# Patient Record
Sex: Female | Born: 1955 | Race: White | Hispanic: No | Marital: Single | Smoking: Former smoker
Health system: Southern US, Community
[De-identification: ages and names within clinical notes are randomized; demographics above are authoritative.]

## PROBLEM LIST (undated history)

## (undated) DIAGNOSIS — Z803 Family history of malignant neoplasm of breast: Secondary | ICD-10-CM

## (undated) DIAGNOSIS — M797 Fibromyalgia: Secondary | ICD-10-CM

## (undated) DIAGNOSIS — C50919 Malignant neoplasm of unspecified site of unspecified female breast: Secondary | ICD-10-CM

## (undated) DIAGNOSIS — B1920 Unspecified viral hepatitis C without hepatic coma: Secondary | ICD-10-CM

## (undated) DIAGNOSIS — B029 Zoster without complications: Secondary | ICD-10-CM

## (undated) DIAGNOSIS — Z808 Family history of malignant neoplasm of other organs or systems: Secondary | ICD-10-CM

## (undated) DIAGNOSIS — Z8042 Family history of malignant neoplasm of prostate: Secondary | ICD-10-CM

## (undated) DIAGNOSIS — J40 Bronchitis, not specified as acute or chronic: Secondary | ICD-10-CM

## (undated) HISTORY — DX: Family history of malignant neoplasm of breast: Z80.3

## (undated) HISTORY — DX: Family history of malignant neoplasm of other organs or systems: Z80.8

## (undated) HISTORY — DX: Unspecified viral hepatitis C without hepatic coma: B19.20

## (undated) HISTORY — DX: Family history of malignant neoplasm of prostate: Z80.42

---

## 2004-06-02 ENCOUNTER — Other Ambulatory Visit: Admission: RE | Admit: 2004-06-02 | Discharge: 2004-06-02 | Payer: Self-pay | Admitting: Family Medicine

## 2010-05-08 DIAGNOSIS — A4902 Methicillin resistant Staphylococcus aureus infection, unspecified site: Secondary | ICD-10-CM

## 2010-05-08 HISTORY — DX: Methicillin resistant Staphylococcus aureus infection, unspecified site: A49.02

## 2014-07-16 ENCOUNTER — Ambulatory Visit: Payer: Self-pay | Admitting: Family

## 2018-04-17 ENCOUNTER — Encounter (INDEPENDENT_AMBULATORY_CARE_PROVIDER_SITE_OTHER): Payer: Self-pay | Admitting: Internal Medicine

## 2019-10-02 ENCOUNTER — Other Ambulatory Visit: Payer: Self-pay | Admitting: *Deleted

## 2019-10-02 ENCOUNTER — Other Ambulatory Visit: Payer: Self-pay | Admitting: Nurse Practitioner

## 2019-10-02 DIAGNOSIS — Z1231 Encounter for screening mammogram for malignant neoplasm of breast: Secondary | ICD-10-CM

## 2019-10-13 ENCOUNTER — Ambulatory Visit
Admission: RE | Admit: 2019-10-13 | Discharge: 2019-10-13 | Disposition: A | Discharge status: Home / Self Care | Payer: Medicare Other | Source: Ambulatory Visit | Attending: *Deleted | Admitting: *Deleted

## 2019-10-13 ENCOUNTER — Other Ambulatory Visit: Payer: Self-pay

## 2019-10-13 DIAGNOSIS — Z1231 Encounter for screening mammogram for malignant neoplasm of breast: Secondary | ICD-10-CM

## 2019-10-14 ENCOUNTER — Other Ambulatory Visit: Payer: Self-pay | Admitting: Nurse Practitioner

## 2019-10-14 DIAGNOSIS — N63 Unspecified lump in unspecified breast: Secondary | ICD-10-CM

## 2019-10-27 ENCOUNTER — Other Ambulatory Visit: Payer: Self-pay | Admitting: Nurse Practitioner

## 2019-10-27 ENCOUNTER — Other Ambulatory Visit: Payer: Self-pay

## 2019-10-27 ENCOUNTER — Ambulatory Visit
Admission: RE | Admit: 2019-10-27 | Discharge: 2019-10-27 | Disposition: A | Discharge status: Home / Self Care | Payer: Medicare Other | Source: Ambulatory Visit | Attending: Nurse Practitioner | Admitting: Nurse Practitioner

## 2019-10-27 DIAGNOSIS — N63 Unspecified lump in unspecified breast: Secondary | ICD-10-CM

## 2019-10-27 DIAGNOSIS — R599 Enlarged lymph nodes, unspecified: Secondary | ICD-10-CM

## 2019-10-27 DIAGNOSIS — R921 Mammographic calcification found on diagnostic imaging of breast: Secondary | ICD-10-CM

## 2019-10-28 ENCOUNTER — Ambulatory Visit
Admission: RE | Admit: 2019-10-28 | Discharge: 2019-10-28 | Disposition: A | Discharge status: Home / Self Care | Payer: Medicare Other | Source: Ambulatory Visit | Attending: Nurse Practitioner | Admitting: Nurse Practitioner

## 2019-10-28 ENCOUNTER — Other Ambulatory Visit: Payer: Self-pay | Admitting: Nurse Practitioner

## 2019-10-28 ENCOUNTER — Other Ambulatory Visit: Payer: Self-pay

## 2019-10-28 DIAGNOSIS — N63 Unspecified lump in unspecified breast: Secondary | ICD-10-CM

## 2019-10-28 DIAGNOSIS — R599 Enlarged lymph nodes, unspecified: Secondary | ICD-10-CM

## 2019-10-30 ENCOUNTER — Encounter: Payer: Self-pay | Admitting: *Deleted

## 2019-10-30 ENCOUNTER — Telehealth: Payer: Self-pay | Admitting: Hematology and Oncology

## 2019-10-30 NOTE — Telephone Encounter (Signed)
Spoke with patient to confirm morning [facility] appointment for 6/30, packet will be mailed to paitent

## 2019-10-31 ENCOUNTER — Ambulatory Visit
Admission: RE | Admit: 2019-10-31 | Discharge: 2019-10-31 | Disposition: A | Discharge status: Home / Self Care | Payer: Medicare Other | Source: Ambulatory Visit | Attending: Nurse Practitioner | Admitting: Nurse Practitioner

## 2019-10-31 ENCOUNTER — Other Ambulatory Visit: Payer: Self-pay | Admitting: Nurse Practitioner

## 2019-10-31 ENCOUNTER — Telehealth: Payer: Self-pay | Admitting: Hematology

## 2019-10-31 ENCOUNTER — Other Ambulatory Visit: Payer: Self-pay

## 2019-10-31 ENCOUNTER — Other Ambulatory Visit: Payer: Self-pay | Admitting: *Deleted

## 2019-10-31 DIAGNOSIS — C50412 Malignant neoplasm of upper-outer quadrant of left female breast: Secondary | ICD-10-CM | POA: Insufficient documentation

## 2019-10-31 DIAGNOSIS — R921 Mammographic calcification found on diagnostic imaging of breast: Secondary | ICD-10-CM

## 2019-10-31 DIAGNOSIS — Z171 Estrogen receptor negative status [ER-]: Secondary | ICD-10-CM

## 2019-10-31 NOTE — Telephone Encounter (Signed)
Patient will now be attending the afternoon clinic on 6/30, patient is aware of change

## 2019-11-04 NOTE — Progress Notes (Signed)
[place]   [phone] Fax:([phone]) [place] Note   Patient Care Team: Scotty Court, DO as PCP - General Mauro Kaufmann, RN as Oncology Nurse Navigator Rockwell Germany, RN as Oncology Nurse Navigator [name], MD as Consulting Physician (General Surgery) Truitt Merle, MD as Consulting Physician (Hematology) Eppie Gibson, MD as Attending Physician (Radiation Oncology)  Date of Service:  11/05/2019   CHIEF COMPLAINTS/PURPOSE OF CONSULTATION:  Newly diagnosed Malignant neoplasm of upper-outer quadrant of left breast, triple negative   Oncology History Overview Note  Cancer Staging Malignant neoplasm of upper-outer quadrant of left breast in female, estrogen receptor negative ([place]) Staging form: Breast, AJCC 8th Edition - Clinical stage from 10/28/2019: cT1b, cN1, cM0, GX, ER-, PR-, HER2- - Signed by Truitt Merle, MD on 11/04/2019    Malignant neoplasm of upper-outer quadrant of left breast in female, estrogen receptor negative ([place])  10/27/2019 Mammogram   IMPRESSION: 1. Right breast calcifications spanning 2.6 cm in the upper slightly medial breast are indeterminate.   2. Left breast dominant mass at 3 o'clock, 6 cm from nipple measuring 3x3.9x2.9 cm is highly suspicious. There is overlying skin thickening, skin involvement is not excluded.   3. Left breast mass/distorted tissue at 1 o'clock, 4cm from nipple measuring 3.0x1.2x2.9 cm is likely in contiguity with the dominant mass at 3 o'clock and is also highly suspicious. With the dominant lesion the overall span a disease is approximately 6 cm.   4. Left breast distortion at 2 o'clock 10 cm from the nipple is Suspicious, measuring 1.0 x 0.8 cm.   5.  Abnormal left axillary lymph nodes (2). Cortical thickness measures up to 0.6 cm.    10/28/2019 Initial Biopsy   Diagnosis 1. Breast, left, needle core biopsy, 3 o'clock, 5cmfn - INVASIVE MAMMARY CARCINOMA. 2. Breast,  left, needle core biopsy, 2 o'clock, 10cmfn - INVASIVE MAMMARY CARCINOMA. 3. 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 Microscopic Comment 1. -3. Overall, immunohistochemistry favors a pronounced desmoplastic response, but metaplastic carcinoma cannot be excluded (Epithelioid component: CKAE1AE3 strong +, CK5/6 weak +). The greatest linear extent of tumor in any one core in specimen 1 is 14 mm. The greatest linear extent of tumor in any one core in specimen 2 is 16 mm.   10/28/2019 Receptors her2   3. PROGNOSTIC INDICATORS Results: IMMUNOHISTOCHEMICAL AND MORPHOMETRIC ANALYSIS PERFORMED MANUALLY The tumor cells are EQUIVOCAL for Her2 (2+). Her2 by FISH will be performed and results reported separately. Estrogen Receptor: 0%, NEGATIVE Progesterone Receptor: 0%, NEGATIVE Proliferation Marker Ki67: 10%    3. FLUORESCENCE IN-SITU HYBRIDIZATION Results: GROUP 5: HER2 **NEGATIVE** Equivocal form of amplification of the HER2 gene was detected in the IHC 2+ tissue sample received from this individual. HER2 FISH was performed by a technologist and cell imaging and analysis on the [employer].   10/28/2019 Cancer Staging   Staging form: Breast, AJCC 8th Edition - Clinical stage from 10/28/2019: Stage IIB (cT1b, cN1, cM0, G3, ER-, PR-, HER2-) - Signed by Truitt Merle, MD on 11/05/2019   10/31/2019 Initial Diagnosis   Malignant neoplasm of upper-outer quadrant of left breast in female, estrogen receptor negative ([place])   10/31/2019 Pathology Results   Diagnosis Breast, right, needle core biopsy, UOQ, posterior - FOCAL USUAL DUCTAL HYPERPLASIA AND FIBROCYSTIC CHANGES WITH CALCIFICATIONS - FIBROADENOMATOID CHANGES - NO MALIGNANCY IDENTIFIED Microscopic Comment These results were called to [facility] on November 03, 2019.      HISTORY OF PRESENTING ILLNESS:  Lindsay [name] 64 y.o. female is a here because of newly diagnosed  left breast cancer. The patient presents to the Breast clinic today alone.   She previously has not had a mammogram in several years. She notes she felt her left breast mass herself in 03/2019 which was about a dime size. She went in for work up for this in the past month as she noticed her mass was growing in 08/2019, now similar to a 50 cent piece. She notes she has manageable pain in her left breast, which the biopsy exacerbated. She has been using Tylenol and ice packs. She has not noticed skin change or nipple discharge.   Today the patient notes occasional HA's in the morning if she does not sleep well. She will use tylenol or rest. She notes chronic bronchitis so will cough occasionally. She has no GI symptoms. Her energy level has decreased in the past 6 months. She notes she has skin rash of her lower right abdomen which she attributes to shingles.   Socially she lives alone and single. She has no children. She quit smoking at age 16. She does not drink alcohol. She has her own Tax inspector. She notes she is processing this diagnosis. She does not have any family that she is close to but has friends she has support from. She notes she is more concerned with quality of life, but is willing to proceed with necessary treatments.   She has a PMHx of Hep C diagnosed in 1992 contracted possible from her friend who had it. This has not been treated, but she is open to have it treated. She has had abdominal scans for this, recently managed by new PCP Dr. Fonnie Jarvis. She had MRSA before which she feels has not fully cleared. Her MGM had unknown cancer and PGM had unknown cancer in bone. Her maternal uncle had prostate cancer. Her paternal cousin had lung cancer. Her maternal aunt had breast cancer in her 9s.    GYN HISTORY  Menarchal: 11 LMP: 10-12 years ago Contraceptive: 1974-75 HRT: No G2P0A2 - 2 abortions    REVIEW OF SYSTEMS:    Constitutional: Denies fevers,  chills or abnormal night sweats (+) Occasional HA's  Eyes: Denies blurriness of vision, double vision or watery eyes Ears, nose, mouth, throat, and face: Denies mucositis or sore throat Respiratory: Denies cough, dyspnea or wheezes Cardiovascular: Denies palpitation, chest discomfort or lower extremity swelling Gastrointestinal:  Denies nausea, heartburn or change in bowel habits Skin: (+) skin rash of her lower right abdomen  Lymphatics: Denies new lymphadenopathy or easy bruising Neurological:Denies numbness, tingling or new weaknesses Behavioral/Psych: Mood is stable, no new changes  Breast: (+) Left breast mass with pain All other systems were reviewed with the patient and are negative.   MEDICAL HISTORY:  Past Medical History:  Diagnosis Date  . HCV (hepatitis C virus)     SURGICAL HISTORY: History reviewed. No pertinent surgical history.  SOCIAL HISTORY: Social History   Socioeconomic History  . Marital status: Single    Spouse name: Not on file  . Number of children: Not on file  . Years of education: Not on file  . Highest education level: Not on file  Occupational History  . Not on file  Tobacco Use  . Smoking status: Never Smoker  . Smokeless tobacco: Never Used  Substance and Sexual Activity  . Alcohol use: Never  .  Drug use: Never  . Sexual activity: Not on file  Other Topics Concern  . Not on file  Social History Narrative  . Not on file   Social Determinants of Health   Financial Resource Strain:   . Difficulty of Paying Living Expenses:   Food Insecurity:   . Worried About Charity fundraiser in the Last Year:   . Arboriculturist in the Last Year:   Transportation Needs:   . Film/video editor (Medical):   Marland Kitchen Lack of Transportation (Non-Medical):   Physical Activity:   . Days of Exercise per Week:   . Minutes of Exercise per Session:   Stress:   . Feeling of Stress :   Social Connections:   . Frequency of Communication with Friends and  Family:   . Frequency of Social Gatherings with Friends and Family:   . Attends Religious Services:   . Active Member of Clubs or Organizations:   . Attends Archivist Meetings:   Marland Kitchen Marital Status:   Intimate Partner Violence:   . Fear of Current or Ex-Partner:   . Emotionally Abused:   Marland Kitchen Physically Abused:   . Sexually Abused:     FAMILY HISTORY: Family History  Problem Relation Age of Onset  . Cancer Maternal Aunt 78       breast cancer  . Cancer Maternal Uncle        prostate cancer  . Cancer Maternal Grandmother        cancer in bone   . Cancer Paternal Grandmother        possible cancer, unknown type   . Cancer Cousin        lung cancer    ALLERGIES:  has no allergies on file.  MEDICATIONS:  Current Outpatient Medications  Medication Sig Dispense Refill  . acetaminophen (TYLENOL) 650 MG CR tablet Take by mouth.    . valACYclovir (VALTREX) 1000 MG tablet Take 1 tablet (1,000 mg total) by mouth 2 (two) times daily. 21 tablet 0   No current facility-administered medications for this visit.    PHYSICAL EXAMINATION: ECOG PERFORMANCE STATUS: 0 - Asymptomatic  Vitals:   11/05/19 1320  BP: 131/73  Pulse: (!) 57  Resp: 19  Temp: 98.7 F (37.1 C)  SpO2: 98%   Filed Weights   11/05/19 1320  Weight: 194 lb (88 kg)    GENERAL:alert, no distress and comfortable SKIN: skin color, texture, turgor are normal (+) Diffuse skin rash of her lower right abdomen without blisters EYES: normal, Conjunctiva are pink and non-injected, sclera clear  NECK: supple, thyroid normal size, non-tender, without nodularity LYMPH:  no palpable lymphadenopathy in the cervical, axillary  LUNGS: clear to auscultation and percussion with normal breathing effort HEART: regular rate & rhythm and no murmurs and no lower extremity edema ABDOMEN:abdomen soft, non-tender and normal bowel sounds Musculoskeletal:no cyanosis of digits and no clubbing  NEURO: alert & oriented x 3 with  fluent speech, no focal motor/sensory deficits BREAST: (+) Left breast swelling, mainly around nipple. (+) Palpable 4x4cm mass in the 2-4:00 position of left breast. Right Breast exam benign s/p biopsy.   LABORATORY DATA:  I have reviewed the data as listed CBC Latest Ref Rng & Units 11/05/2019  WBC 4.0 - 10.5 K/uL 5.1  Hemoglobin 12.0 - 15.0 g/dL 13.6  Hematocrit 36 - 46 % 40.7  Platelets 150 - 400 K/uL 152    CMP Latest Ref Rng & Units 11/05/2019  Glucose 70 -  99 mg/dL 106(H)  BUN 8 - 23 mg/dL 13  Creatinine 0.44 - 1.00 mg/dL 0.79  Sodium 135 - 145 mmol/L 143  Potassium 3.5 - 5.1 mmol/L 4.6  Chloride 98 - 111 mmol/L 108  CO2 22 - 32 mmol/L 27  Calcium 8.9 - 10.3 mg/dL 9.3  Total Protein 6.5 - 8.1 g/dL 6.6  Total Bilirubin 0.3 - 1.2 mg/dL 0.6  Alkaline Phos 38 - 126 U/L 71  AST 15 - 41 U/L 33  ALT 0 - 44 U/L 17     RADIOGRAPHIC STUDIES: I have personally reviewed the radiological images as listed and agreed with the findings in the report. US BREAST LTD UNI LEFT INC AXILLA  Result Date: 10/27/2019 CLINICAL DATA:  64 year old female presenting with a new lump in the left breast since November. EXAM: DIGITAL DIAGNOSTIC BILATERAL MAMMOGRAM WITH CAD AND TOMO ULTRASOUND LEFT BREAST COMPARISON:  None. ACR Breast Density Category b: There are scattered areas of fibroglandular density. FINDINGS: Mammogram: Right breast: Spot 2D magnification and full field mL views were performed in addition to standard views. There are loosely grouped round and amorphous calcifications in the superior slightly medial right breast spanning approximately 2.6 cm. No associated mass or asymmetry. There are no additional suspicious findings in the right breast. Left breast: Spot compression tomosynthesis views of the left breast were performed in addition to standard views. A BB marks the palpable site of concern in the outer left breast. At the palpable site in the outer breast posterior depth there is an  irregular spiculated mass spanning 3.5 cm with associated microcalcifications and distortion. There is an additional area of asymmetry/distortion which appears in contiguity with the dominant mass located more superiorly and medially. The overall abnormal area spans approximately 5.7 cm. There is associated skin thickening. More superiorly in the left breast seen only on the MLO view there is a far posterior area of distortion which is located approximately 7 cm away from the dominant mass. Additionally in the left axilla there is an asymmetry measuring 1.2 cm. Mammographic images were processed with CAD. On physical exam, I palpate a firm discrete mass and overlying skin thickening in the outer left breast. There is no color change within the skin. This corresponds to the lump reported by the patient. Ultrasound: Targeted ultrasound is performed in the left breast at 3 o'clock 6 cm from nipple at the palpable site of concern demonstrating a poorly defined irregular hypoechoic mass measuring 3.0 x 3.9 x 2.9 cm. This corresponds to the dominant mass identified mammographically. At 1 o'clock 4 cm from the nipple there is distorted abnormal tissue measuring approximately 3.0 x 1.2 x 2.9 cm. At 2 o'clock 10 cm from nipple there is an area of abnormal distorted tissue measuring 1.0 x 0.8 cm. This likely corresponds to the finding mammographically in the far posterior breast seen only on the MLO view. Targeted ultrasound of the left axilla demonstrates 2 abnormal lymph nodes with rounded appearance and eccentric cortical thickening. Cortical thickness measures up to 0.6 cm. There is a lymph node with surrounding increased echogenic tissue in the low axilla which likely corresponds to the asymmetry seen mammographically. IMPRESSION: 1. Right breast calcifications spanning 2.6 cm in the upper slightly medial breast are indeterminate. 2. Left breast dominant mass at 3 o'clock measuring 3.9 cm is highly suspicious. There is  overlying skin thickening, skin involvement is not excluded. 3. Left breast mass/distorted tissue at 1 o'clock measuring 3.0 cm is likely in contiguity with the  dominant mass at 3 o'clock and is also highly suspicious. With the dominant lesion the overall span a disease is approximately 6 cm. 4. Left breast distortion at 2 o'clock 10 cm from the nipple is suspicious. 5.  Abnormal left axillary lymph nodes (2). RECOMMENDATION: 1. Stereotactic core needle biopsy of the right breast calcifications. 2. Ultrasound-guided core needle biopsy x3 for the left breast and axilla. Recommend targeting the dominant mass at 3 o'clock, the distortion at 2 o'clock as well as 1 of the abnormal axillary lymph nodes. 3.  MRI to characterize extent of disease. I have discussed the findings and recommendations with the patient. The patient will be scheduled for the biopsies prior to leaving our office today. BI-RADS CATEGORY  5: Highly suggestive of malignancy. Electronically Signed   By: Audie Pinto M.D.   On: 10/27/2019 14:08   MM DIAG BREAST TOMO BILATERAL  Result Date: 10/27/2019 CLINICAL DATA:  64 year old female presenting with a new lump in the left breast since November. EXAM: DIGITAL DIAGNOSTIC BILATERAL MAMMOGRAM WITH CAD AND TOMO ULTRASOUND LEFT BREAST COMPARISON:  None. ACR Breast Density Category b: There are scattered areas of fibroglandular density. FINDINGS: Mammogram: Right breast: Spot 2D magnification and full field mL views were performed in addition to standard views. There are loosely grouped round and amorphous calcifications in the superior slightly medial right breast spanning approximately 2.6 cm. No associated mass or asymmetry. There are no additional suspicious findings in the right breast. Left breast: Spot compression tomosynthesis views of the left breast were performed in addition to standard views. A BB marks the palpable site of concern in the outer left breast. At the palpable site in the  outer breast posterior depth there is an irregular spiculated mass spanning 3.5 cm with associated microcalcifications and distortion. There is an additional area of asymmetry/distortion which appears in contiguity with the dominant mass located more superiorly and medially. The overall abnormal area spans approximately 5.7 cm. There is associated skin thickening. More superiorly in the left breast seen only on the MLO view there is a far posterior area of distortion which is located approximately 7 cm away from the dominant mass. Additionally in the left axilla there is an asymmetry measuring 1.2 cm. Mammographic images were processed with CAD. On physical exam, I palpate a firm discrete mass and overlying skin thickening in the outer left breast. There is no color change within the skin. This corresponds to the lump reported by the patient. Ultrasound: Targeted ultrasound is performed in the left breast at 3 o'clock 6 cm from nipple at the palpable site of concern demonstrating a poorly defined irregular hypoechoic mass measuring 3.0 x 3.9 x 2.9 cm. This corresponds to the dominant mass identified mammographically. At 1 o'clock 4 cm from the nipple there is distorted abnormal tissue measuring approximately 3.0 x 1.2 x 2.9 cm. At 2 o'clock 10 cm from nipple there is an area of abnormal distorted tissue measuring 1.0 x 0.8 cm. This likely corresponds to the finding mammographically in the far posterior breast seen only on the MLO view. Targeted ultrasound of the left axilla demonstrates 2 abnormal lymph nodes with rounded appearance and eccentric cortical thickening. Cortical thickness measures up to 0.6 cm. There is a lymph node with surrounding increased echogenic tissue in the low axilla which likely corresponds to the asymmetry seen mammographically. IMPRESSION: 1. Right breast calcifications spanning 2.6 cm in the upper slightly medial breast are indeterminate. 2. Left breast dominant mass at 3 o'clock  measuring  3.9 cm is highly suspicious. There is overlying skin thickening, skin involvement is not excluded. 3. Left breast mass/distorted tissue at 1 o'clock measuring 3.0 cm is likely in contiguity with the dominant mass at 3 o'clock and is also highly suspicious. With the dominant lesion the overall span a disease is approximately 6 cm. 4. Left breast distortion at 2 o'clock 10 cm from the nipple is suspicious. 5.  Abnormal left axillary lymph nodes (2). RECOMMENDATION: 1. Stereotactic core needle biopsy of the right breast calcifications. 2. Ultrasound-guided core needle biopsy x3 for the left breast and axilla. Recommend targeting the dominant mass at 3 o'clock, the distortion at 2 o'clock as well as 1 of the abnormal axillary lymph nodes. 3.  MRI to characterize extent of disease. I have discussed the findings and recommendations with the patient. The patient will be scheduled for the biopsies prior to leaving our office today. BI-RADS CATEGORY  5: Highly suggestive of malignancy. Electronically Signed   By: Audie Pinto M.D.   On: 10/27/2019 14:08   [place] AXILLARY NODE CORE BIOPSY LEFT  Addendum Date: 10/30/2019   ADDENDUM REPORT: 10/30/2019 14:03 ADDENDUM: Pathology revealed INVASIVE MAMMARY CARCINOMA of the LEFT breast, both locations, 3 o'clock, 5cmfn and 2 o'clock, 10cmfn. This was found to be concordant by Dr. Kristopher Oppenheim. 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 This was found to be concordant by Dr. Kristopher Oppenheim. Pathology results were discussed with the patient by telephone. The patient reported doing well after the biopsies with tenderness, bruising and swelling at the sites. Post biopsy instructions and care were reviewed and questions were answered. The patient was encouraged to call [facility] for any additional concerns. My direct phone number was provided for the patient. The patient was referred to [facility] on November 05, 2019. Recommendation for a bilateral breast MRI to characterize extent of disease and the chest wall. The patient is scheduled for a RIGHT breast stereotatic guided biopsy on October 31, 2019. Further recommendations will be guided by the results of this biopsy. Pathology results reported by Terie Purser, RN on 10/30/2019. Electronically Signed   By: Kristopher Oppenheim M.D.   On: 10/30/2019 14:03   Result Date: 10/30/2019 CLINICAL DATA:  64 year old female with suspicious left breast masses and axillary lymph node. EXAM: ULTRASOUND GUIDED LEFT BREAST/AXILLA CORE NEEDLE BIOPSY X3 COMPARISON:  Previous exam(s). PROCEDURE: I met with the patient and we discussed the procedure of ultrasound-guided biopsy, including benefits and alternatives. We discussed the high likelihood of a successful procedure. We discussed the risks of the procedure, including infection, bleeding, tissue injury, clip migration, and inadequate sampling. Informed written consent was given. The usual time-out protocol was performed immediately prior to the procedure. Lesion quadrant: Upper outer quadrant Using sterile technique and 1% Lidocaine as local anesthetic, under direct ultrasound visualization, a 12 gauge spring-loaded device was used to perform biopsy of a mass at the 3 o'clock position 5 cm from the nipple using a inferior approach. At the conclusion of the procedure ribbon shaped tissue marker clip was deployed into the biopsy cavity. Lesion quadrant: Upper outer quadrant Using sterile technique and 1% Lidocaine as local anesthetic, under direct ultrasound visualization, a 14 gauge spring-loaded device was used to perform biopsy of a mass/distortion at the 2 o'clock position 10 cm from the nipple using a lateral approach. At the conclusion of the procedure coil shaped tissue marker  clip was deployed into the biopsy cavity. Using sterile technique and 1% Lidocaine as  local anesthetic, under direct ultrasound visualization, a 14 gauge spring-loaded device was used to perform biopsy of a left axillary lymph node using a lateral approach. At the conclusion of the procedure [facility] tissue marker clip was deployed into the biopsy cavity. Follow up 2 view mammogram was performed and dictated separately. IMPRESSION: Ultrasound guided biopsy of the left breast x2 and left axilla. No apparent complications. Electronically Signed: By: Kristopher Oppenheim M.D. On: 10/28/2019 14:58   MM CLIP PLACEMENT LEFT  Result Date: 10/28/2019 CLINICAL DATA:  64 year old female status post ultrasound-guided biopsies of the left breast and axilla. EXAM: DIAGNOSTIC LEFT MAMMOGRAM POST ULTRASOUND BIOPSY COMPARISON:  Previous exam(s). FINDINGS: Mammographic images were obtained following ultrasound guided biopsy of the left breast and axilla. Ribbon and coil shaped clips are identified in the expected locations in the lateral and upper outer left breast, respectively. At the time of [facility] clip placement in the left axillary lymph node, there was a question of whether the clip slipped out of the biopsied lymph node. Therefore, the patient was taken back to the procedure suite and a second Q shaped clip was deployed into the lymph node. This was confirmed to be located within the lymph node on real-time scanning with ultrasound. Repeat mammographic views demonstrate the [facility] and Q shaped clips located adjacent to one another within a masslike asymmetry, likely representing the biopsied node. IMPRESSION: 1. Appropriate positioning of ribbon and coil shaped clips within the sites of biopsy in the left breast. 2. Appropriate positioning of a Q and [employer]MARK clip within a left axillary lymph node. Final Assessment: Post Procedure Mammograms for Marker Placement Electronically Signed   By: Kristopher Oppenheim M.D.   On: 10/28/2019 15:32   MM CLIP PLACEMENT RIGHT  Result Date: 10/31/2019 CLINICAL DATA:   64 year old female status post stereotactic biopsy of right breast calcifications. EXAM: DIAGNOSTIC RIGHT MAMMOGRAM POST STEREOTACTIC BIOPSY COMPARISON:  Previous exam(s). FINDINGS: Mammographic images were obtained following stereotactic guided biopsy of of right breast calcifications. The biopsy marking clip is in expected position at the site of biopsy. IMPRESSION: Appropriate positioning of the coil shaped biopsy marking clip at the site of biopsy in the upper-outer right breast. Final Assessment: Post Procedure Mammograms for Marker Placement Electronically Signed   By: Kristopher Oppenheim M.D.   On: 10/31/2019 14:52   [place] LT BREAST BX W LOC DEV 1ST LESION IMG BX SPEC US GUIDE  Addendum Date: 10/30/2019   ADDENDUM REPORT: 10/30/2019 14:03 ADDENDUM: Pathology revealed INVASIVE MAMMARY CARCINOMA of the LEFT breast, both locations, 3 o'clock, 5cmfn and 2 o'clock, 10cmfn. This was found to be concordant by Dr. Kristopher Oppenheim. 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 This was found to be concordant by Dr. Kristopher Oppenheim. Pathology results were discussed with the patient by telephone. The patient reported doing well after the biopsies with tenderness, bruising and swelling at the sites. Post biopsy instructions and care were reviewed and questions were answered. The patient was encouraged to call [facility] for any additional concerns. My direct phone number was provided for the patient. The patient was referred to [facility] on November 05, 2019. Recommendation for a bilateral breast MRI to characterize extent of disease and the chest wall. The patient is scheduled for a RIGHT breast stereotatic guided biopsy on October 31, 2019. Further  recommendations will be guided by the results of this biopsy. Pathology results reported by Terie Purser, RN on 10/30/2019. Electronically Signed   By:  Kristopher Oppenheim M.D.   On: 10/30/2019 14:03   Result Date: 10/30/2019 CLINICAL DATA:  64 year old female with suspicious left breast masses and axillary lymph node. EXAM: ULTRASOUND GUIDED LEFT BREAST/AXILLA CORE NEEDLE BIOPSY X3 COMPARISON:  Previous exam(s). PROCEDURE: I met with the patient and we discussed the procedure of ultrasound-guided biopsy, including benefits and alternatives. We discussed the high likelihood of a successful procedure. We discussed the risks of the procedure, including infection, bleeding, tissue injury, clip migration, and inadequate sampling. Informed written consent was given. The usual time-out protocol was performed immediately prior to the procedure. Lesion quadrant: Upper outer quadrant Using sterile technique and 1% Lidocaine as local anesthetic, under direct ultrasound visualization, a 12 gauge spring-loaded device was used to perform biopsy of a mass at the 3 o'clock position 5 cm from the nipple using a inferior approach. At the conclusion of the procedure ribbon shaped tissue marker clip was deployed into the biopsy cavity. Lesion quadrant: Upper outer quadrant Using sterile technique and 1% Lidocaine as local anesthetic, under direct ultrasound visualization, a 14 gauge spring-loaded device was used to perform biopsy of a mass/distortion at the 2 o'clock position 10 cm from the nipple using a lateral approach. At the conclusion of the procedure coil shaped tissue marker clip was deployed into the biopsy cavity. Using sterile technique and 1% Lidocaine as local anesthetic, under direct ultrasound visualization, a 14 gauge spring-loaded device was used to perform biopsy of a left axillary lymph node using a lateral approach. At the conclusion of the procedure [facility] tissue marker clip was deployed into the biopsy cavity. Follow up 2 view mammogram was performed and dictated separately. IMPRESSION: Ultrasound guided biopsy of the left breast x2 and left axilla. No apparent  complications. Electronically Signed: By: Kristopher Oppenheim M.D. On: 10/28/2019 14:58   [place] LT BREAST BX W LOC DEV EA ADD LESION IMG BX SPEC US GUIDE  Addendum Date: 10/30/2019   ADDENDUM REPORT: 10/30/2019 14:03 ADDENDUM: Pathology revealed INVASIVE MAMMARY CARCINOMA of the LEFT breast, both locations, 3 o'clock, 5cmfn and 2 o'clock, 10cmfn. This was found to be concordant by Dr. Kristopher Oppenheim. 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 This was found to be concordant by Dr. Kristopher Oppenheim. Pathology results were discussed with the patient by telephone. The patient reported doing well after the biopsies with tenderness, bruising and swelling at the sites. Post biopsy instructions and care were reviewed and questions were answered. The patient was encouraged to call [facility] for any additional concerns. My direct phone number was provided for the patient. The patient was referred to [facility] on November 05, 2019. Recommendation for a bilateral breast MRI to characterize extent of disease and the chest wall. The patient is scheduled for a RIGHT breast stereotatic guided biopsy on October 31, 2019. Further recommendations will be guided by the results of this biopsy. Pathology results reported by Terie Purser, RN on 10/30/2019. Electronically Signed   By: Kristopher Oppenheim M.D.   On: 10/30/2019 14:03   Result Date: 10/30/2019 CLINICAL DATA:  64 year old female with suspicious left breast masses and axillary lymph node. EXAM: ULTRASOUND GUIDED LEFT BREAST/AXILLA CORE NEEDLE BIOPSY X3 COMPARISON:  Previous exam(s). PROCEDURE: I met with the patient and we discussed the procedure  of ultrasound-guided biopsy, including benefits and alternatives. We discussed the high likelihood of a successful procedure. We discussed the risks of the procedure, including infection, bleeding, tissue injury, clip  migration, and inadequate sampling. Informed written consent was given. The usual time-out protocol was performed immediately prior to the procedure. Lesion quadrant: Upper outer quadrant Using sterile technique and 1% Lidocaine as local anesthetic, under direct ultrasound visualization, a 12 gauge spring-loaded device was used to perform biopsy of a mass at the 3 o'clock position 5 cm from the nipple using a inferior approach. At the conclusion of the procedure ribbon shaped tissue marker clip was deployed into the biopsy cavity. Lesion quadrant: Upper outer quadrant Using sterile technique and 1% Lidocaine as local anesthetic, under direct ultrasound visualization, a 14 gauge spring-loaded device was used to perform biopsy of a mass/distortion at the 2 o'clock position 10 cm from the nipple using a lateral approach. At the conclusion of the procedure coil shaped tissue marker clip was deployed into the biopsy cavity. Using sterile technique and 1% Lidocaine as local anesthetic, under direct ultrasound visualization, a 14 gauge spring-loaded device was used to perform biopsy of a left axillary lymph node using a lateral approach. At the conclusion of the procedure [facility] tissue marker clip was deployed into the biopsy cavity. Follow up 2 view mammogram was performed and dictated separately. IMPRESSION: Ultrasound guided biopsy of the left breast x2 and left axilla. No apparent complications. Electronically Signed: By: Kristopher Oppenheim M.D. On: 10/28/2019 14:58   MM RT BREAST BX W LOC DEV 1ST LESION IMAGE BX SPEC STEREO GUIDE  Addendum Date: 11/03/2019   ADDENDUM REPORT: 11/03/2019 14:09 ADDENDUM: Pathology revealed FOCAL USUAL DUCTAL HYPERPLASIA AND FIBROCYSTIC CHANGES WITH CALCIFICATIONS, FIBROADENOMATOID CHANGES of the RIGHT breast, upper outer quadrant, posterior. This was found to be concordant by Dr. Kristopher Oppenheim. Pathology results were discussed with the patient by telephone. The patient reported doing  well after the biopsy with tenderness at the site. Post biopsy instructions and care were reviewed and questions were answered. The patient was encouraged to call [facility] for any additional concerns. The patient has a recent diagnosis of left breast cancer and should follow her outlined treatment plan. Pathology results reported by Stacie Acres RN on 11/03/2019. Electronically Signed   By: Kristopher Oppenheim M.D.   On: 11/03/2019 14:09   Result Date: 11/03/2019 CLINICAL DATA:  64 year old female with recently diagnosed left breast cancer presents for stereotactic biopsy of indeterminate right breast calcifications. EXAM: RIGHT BREAST STEREOTACTIC CORE NEEDLE BIOPSY COMPARISON:  Previous exams. FINDINGS: The patient and I discussed the procedure of stereotactic-guided biopsy including benefits and alternatives. We discussed the high likelihood of a successful procedure. We discussed the risks of the procedure including infection, bleeding, tissue injury, clip migration, and inadequate sampling. Informed written consent was given. The usual time out protocol was performed immediately prior to the procedure. Using sterile technique and 1% Lidocaine as local anesthetic, under stereotactic guidance, a 9 gauge vacuum assisted device was used to perform core needle biopsy of calcifications in the upper-outer quadrant of the right breast using a superior approach. Specimen radiograph was performed showing calcifications in several specimens. Specimens with calcifications are identified for pathology. Lesion quadrant: Upper outer quadrant At the conclusion of the procedure, coil shaped tissue marker clip was deployed into the biopsy cavity. Follow-up 2-view mammogram was performed and dictated separately. IMPRESSION: Stereotactic-guided biopsy of right breast calcifications. No apparent complications. Electronically Signed: By: Roanna Epley  Jeanmarie Plant M.D. On: 10/31/2019 14:51    ASSESSMENT & PLAN:    Lindsay [name] is a 64 y.o. Caucasian female with  1. 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  palpated her left breast mass in 03/2019 which has increased in size, mainly in the past 1-2 months.  -We discussed her image findings and the biopsy results in great details. She has 3 masses in her left breast. The 2 masses at 3:00 and 2:00 positions were biopsied along with left axillary LN. I discussed her pathology which showed invasive mammary carcinoma with immunohistochemistry favors a pronounced desmoplastic response, but metaplastic carcinoma cannot be excluded. This is triple negative. Her 10/31/19 right breast biopsy was benign.  -Given her Lymph node involvement and triple disease, I recommend CT CAP and bone scan to evaluate for distant metastasis. She is agreeable.  -We discussed her case in Breast Tumor Board today and recommended proceeding with surgery first to obtain final pathology diagnosis. She has discussed surgery with Dr Marlou Starks who recommend Left mastectomy with axillary LN dissection. She is agreeable.  -The risk of recurrence depends on the stage and biology of the tumor. She has triple negative tumor which is more aggressive.  Metaplastic carcinoma in general less sensitive to chemotherapy. Due to the rarerity of this type histology, we do not have clinical data to guide [place] for treatment.  Adjuvant chemotherapy will likely be recommended to reduce her risk of recurrence. -Given her LN involvement she will benefit from post-mastectomy Radiation to reduce her risk of local recurrence. She has discussed this with Dr. Isidore Moos.  -Given her ER/PR negative disease antiestrogen therapy is not recommended.  -Labs reviewed, CBC and CMP WNL except BG 106. Physical shows her palpable 4cm mass in her left breast along with breast swelling.  -F/u after surgery.     2. Hepatitis C, untreasted, diagnosed in 1992 -She notes this was likely contracted from a friend who had it.  -Given she did not have insurance, this has not been treated. I discussed her seeing Infections Disease Doctor for treatment and management. She is agreeable.  -She also notes a history of MRSA which she is not sure has completed cleared.    3.  Rash on right low abdominal wall, possible shingles -She notes she has skin rash of her lower right abdomen recently which she attributes to shingles. She notes to having this before. This is consistent on her exam today with rash of her RLQ abdomen (11/05/19).  -I will call in Valtrex today for her to take for a week. I encouraged her to drink plenty of water while on this.   4. Social and financial support  -She has no children and no close relatives. She does have close friends who are supportive.  -She did not have medical insurance for many years. She recently applied for blue cross and blue shield which is not effective yet.    PLAN:  -I called in Valtrex today  -CT CAP w contrast and bone scan in 1-2 weeks for staging -Proceed with left mastectomy and ALND by Dr. Marlou Starks soon  -F/u 2-3 weeks after surgery    Orders Placed This Encounter  Procedures  . CT Abdomen Pelvis W Contrast    Standing Status:   Future    Standing Expiration Date:   11/04/2020    Order Specific Question:   If indicated for the ordered  procedure, I authorize the administration of contrast media per Radiology protocol    Answer:   Yes    Order Specific Question:   Preferred imaging location?    Answer:   [facility]    Order Specific Question:   Release to patient    Answer:   Immediate    Order Specific Question:   Is Oral Contrast requested for this exam?    Answer:   Yes, Per Radiology protocol    Order Specific Question:   Radiology Contrast Protocol - do NOT remove file path    Answer:   \\[email or web address]  . CT  Chest W Contrast    Standing Status:   Future    Standing Expiration Date:   11/04/2020    Order Specific Question:   If indicated for the ordered procedure, I authorize the administration of contrast media per Radiology protocol    Answer:   Yes    Order Specific Question:   Preferred imaging location?    Answer:   [facility]    Order Specific Question:   Radiology Contrast Protocol - do NOT remove file path    Answer:   \\[email or web address]  . NM Bone Scan Whole Body    Standing Status:   Future    Standing Expiration Date:   11/04/2020    Order Specific Question:   If indicated for the ordered procedure, I authorize the administration of a radiopharmaceutical per Radiology protocol    Answer:   Yes    Order Specific Question:   Preferred imaging location?    Answer:   [facility]    Order Specific Question:   Release to patient    Answer:   Immediate    Order Specific Question:   Radiology Contrast Protocol - do NOT remove file path    Answer:   \\[email or web address]    All questions were answered. The patient knows to call the clinic with any problems, questions or concerns. The total time spent in the appointment was 60 minutes.     Truitt Merle, MD 11/05/2019 3:53 PM  I, Joslyn Devon, am acting as scribe for Truitt Merle, MD.   I have reviewed the above documentation for accuracy and completeness, and I agree with the above.

## 2019-11-05 ENCOUNTER — Inpatient Hospital Stay: Payer: Medicare Other

## 2019-11-05 ENCOUNTER — Ambulatory Visit: Payer: Self-pay | Admitting: General Surgery

## 2019-11-05 ENCOUNTER — Ambulatory Visit
Admission: RE | Admit: 2019-11-05 | Discharge: 2019-11-05 | Disposition: A | Discharge status: Home / Self Care | Payer: Medicare Other | Source: Ambulatory Visit | Attending: Radiation Oncology | Admitting: Radiation Oncology

## 2019-11-05 ENCOUNTER — Other Ambulatory Visit: Payer: Self-pay

## 2019-11-05 ENCOUNTER — Other Ambulatory Visit: Payer: Self-pay | Admitting: *Deleted

## 2019-11-05 ENCOUNTER — Encounter: Payer: Self-pay | Admitting: Physical Therapy

## 2019-11-05 ENCOUNTER — Inpatient Hospital Stay: Payer: Medicare Other | Attending: Hematology | Admitting: Hematology

## 2019-11-05 ENCOUNTER — Ambulatory Visit (HOSPITAL_BASED_OUTPATIENT_CLINIC_OR_DEPARTMENT_OTHER): Payer: Medicare Other | Admitting: Genetic Counselor

## 2019-11-05 ENCOUNTER — Encounter: Payer: Self-pay | Admitting: Radiation Oncology

## 2019-11-05 ENCOUNTER — Encounter: Payer: Self-pay | Admitting: Hematology

## 2019-11-05 ENCOUNTER — Encounter: Payer: Self-pay | Admitting: *Deleted

## 2019-11-05 ENCOUNTER — Ambulatory Visit: Payer: Medicare Other | Attending: General Surgery | Admitting: Physical Therapy

## 2019-11-05 VITALS — BP 131/73 | HR 57 | Temp 98.7°F | Resp 19 | Ht 64.0 in | Wt 194.0 lb

## 2019-11-05 DIAGNOSIS — Z170 Estrogen receptor positive status [ER+]: Secondary | ICD-10-CM | POA: Diagnosis present

## 2019-11-05 DIAGNOSIS — B1920 Unspecified viral hepatitis C without hepatic coma: Secondary | ICD-10-CM | POA: Diagnosis not present

## 2019-11-05 DIAGNOSIS — R293 Abnormal posture: Secondary | ICD-10-CM | POA: Insufficient documentation

## 2019-11-05 DIAGNOSIS — Z171 Estrogen receptor negative status [ER-]: Secondary | ICD-10-CM | POA: Insufficient documentation

## 2019-11-05 DIAGNOSIS — C50412 Malignant neoplasm of upper-outer quadrant of left female breast: Secondary | ICD-10-CM

## 2019-11-05 DIAGNOSIS — Z803 Family history of malignant neoplasm of breast: Secondary | ICD-10-CM

## 2019-11-05 DIAGNOSIS — Z808 Family history of malignant neoplasm of other organs or systems: Secondary | ICD-10-CM

## 2019-11-05 DIAGNOSIS — Z8042 Family history of malignant neoplasm of prostate: Secondary | ICD-10-CM

## 2019-11-05 LAB — CMP (CANCER CENTER ONLY)
ALT: 17 U/L (ref 0–44)
AST: 33 U/L (ref 15–41)
Albumin: 4 g/dL (ref 3.5–5.0)
Alkaline Phosphatase: 71 U/L (ref 38–126)
Anion gap: 8 (ref 5–15)
BUN: 13 mg/dL (ref 8–23)
CO2: 27 mmol/L (ref 22–32)
Calcium: 9.3 mg/dL (ref 8.9–10.3)
Chloride: 108 mmol/L (ref 98–111)
Creatinine: 0.79 mg/dL (ref 0.44–1.00)
GFR, Est AFR Am: >60 mL/min (ref >60)
GFR, Estimated: >60 mL/min (ref >60)
Glucose, Bld: 106 mg/dL — ABNORMAL HIGH (ref 70–99)
Potassium: 4.6 mmol/L (ref 3.5–5.1)
Sodium: 143 mmol/L (ref 135–145)
Total Bilirubin: 0.6 mg/dL (ref 0.3–1.2)
Total Protein: 6.6 g/dL (ref 6.5–8.1)

## 2019-11-05 LAB — CBC WITH DIFFERENTIAL (CANCER CENTER ONLY)
Abs Immature Granulocytes: 0.01 10*3/uL (ref 0.00–0.07)
Basophils Absolute: 0.1 10*3/uL (ref 0.0–0.1)
Basophils Relative: 1 %
Eosinophils Absolute: 0.2 10*3/uL (ref 0.0–0.5)
Eosinophils Relative: 4 %
HCT: 40.7 % (ref 36.0–46.0)
Hemoglobin: 13.6 g/dL (ref 12.0–15.0)
Immature Granulocytes: 0 %
Lymphocytes Relative: 37 %
Lymphs Abs: 1.9 10*3/uL (ref 0.7–4.0)
MCH: 32.2 pg (ref 26.0–34.0)
MCHC: 33.4 g/dL (ref 30.0–36.0)
MCV: 96.4 fL (ref 80.0–100.0)
Monocytes Absolute: 0.4 10*3/uL (ref 0.1–1.0)
Monocytes Relative: 8 %
Neutro Abs: 2.5 10*3/uL (ref 1.7–7.7)
Neutrophils Relative %: 50 %
Platelet Count: 152 10*3/uL (ref 150–400)
RBC: 4.22 MIL/uL (ref 3.87–5.11)
RDW: 12.2 % (ref 11.5–15.5)
WBC Count: 5.1 10*3/uL (ref 4.0–10.5)
nRBC: 0 % (ref 0.0–0.2)

## 2019-11-05 LAB — GENETIC SCREENING ORDER

## 2019-11-05 MED ORDER — VALACYCLOVIR HCL 1 G PO TABS
1000.0000 mg | ORAL_TABLET | Freq: Two times a day (BID) | ORAL | 0 refills | Status: DC
Start: 2019-11-05 — End: 2019-12-27

## 2019-11-05 MED ORDER — ACYCLOVIR 400 MG PO TABS
400.0000 mg | ORAL_TABLET | Freq: Three times a day (TID) | ORAL | 0 refills | Status: DC
Start: 2019-11-05 — End: 2019-11-05

## 2019-11-05 NOTE — Therapy (Signed)
[place], [place], [address] Phone: [phone]   Fax:  [phone]  Physical Therapy Evaluation  Patient Details  Name: Lindsay [name] MRN: [id number] Date of Birth: Jan 25, 1956 Referring Provider (PT): Dr. Autumn Messing   Encounter Date: 11/05/2019   PT End of Session - 11/05/19 1512    Visit Number 1    Number of Visits 2    Date for PT Re-Evaluation 12/31/19    PT Start Time 1275    PT Stop Time 1700   Also saw pt from to for a total of minutes   PT Time Calculation (min) 29 min    Activity Tolerance Patient tolerated treatment well    Behavior During Therapy [facility] for tasks assessed/performed           Past Medical History:  Diagnosis Date  . HCV (hepatitis C virus)     History reviewed. No pertinent surgical history.  There were no vitals filed for this visit.    Subjective Assessment - 11/05/19 1505    Subjective Patient reports she is here today to be seen by her medical team for her newly diagnosed left breast cancer.    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 There are 2 areas measuring 1 cm and 3.9 cm in the upper outer quadrant. The Ki67 is 10%.    Patient Stated Goals Reduce lymphedema risk and learn post op shoulder ROM HEP    Currently in Pain? No/denies              [facility] PT Assessment - 11/05/19 0001      Assessment   Medical Diagnosis Left breast cancer    Referring Provider (PT) Dr. Autumn Messing    Onset Date/Surgical Date 10/27/19    Hand Dominance Right    Prior Therapy none      Precautions   Precautions Other (comment)    Precaution Comments active cancer      Restrictions   Weight Bearing Restrictions No      Balance Screen   Has the patient fallen in the past 6 months No    Has the patient had a decrease in activity level because of a fear of falling?  No    Is the patient  reluctant to leave their home because of a fear of falling?  No      Home Environment   Living Environment Private residence    Living Arrangements Alone    Available Help at Discharge Friend(s)      Prior Function   Level of Independence Independent    Vocation Full time employment    Vocation Requirements horticulturist / florist    Leisure She does not exercise      Cognition   Overall Cognitive Status Within Functional Limits for tasks assessed      Posture/Postural Control   Posture/Postural Control Postural limitations    Postural Limitations Rounded Shoulders;Forward head      ROM / Strength   AROM / PROM / Strength AROM;Strength      AROM   AROM Assessment Site Shoulder    Right/Left Shoulder Right;Left    Right Shoulder Extension 52 Degrees    Right Shoulder Flexion 140 Degrees    Right Shoulder ABduction 144 Degrees    Right Shoulder Internal Rotation 74 Degrees    Right Shoulder External Rotation 85 Degrees    Left Shoulder Extension 61 Degrees  Left Shoulder Flexion 146 Degrees    Left Shoulder ABduction 151 Degrees    Left Shoulder Internal Rotation 77 Degrees    Left Shoulder External Rotation 87 Degrees      Strength   Overall Strength Within functional limits for tasks performed             LYMPHEDEMA/ONCOLOGY QUESTIONNAIRE - 11/05/19 0001      Type   Cancer Type Left breast cancer      Lymphedema Assessments   Lymphedema Assessments Upper extremities      Right Upper Extremity Lymphedema   10 cm Proximal to Olecranon Process 33.5 cm    Olecranon Process 27.5 cm    10 cm Proximal to Ulnar Styloid Process 24.7 cm    Just Proximal to Ulnar Styloid Process 17 cm    Across Hand at [employer] 19.7 cm    At [place] of 2nd Digit 7.2 cm      Left Upper Extremity Lymphedema   10 cm Proximal to Olecranon Process 34.3 cm    Olecranon Process 26.7 cm    10 cm Proximal to Ulnar Styloid Process 24.1 cm    Just Proximal to Ulnar Styloid Process  17.8 cm    Across Hand at [employer] 19.8 cm    At [place] of 2nd Digit 6.9 cm           L-DEX FLOWSHEETS - 11/05/19 1500      L-DEX LYMPHEDEMA SCREENING   Measurement Type Unilateral    L-DEX MEASUREMENT EXTREMITY Upper Extremity    POSITION  Standing    DOMINANT SIDE Right    At Risk Side Left    BASELINE SCORE (UNILATERAL) 7.4          The patient was assessed using the L-Dex machine today to produce a lymphedema index baseline score. The patient will be reassessed on a regular basis (typically every 3 months) to obtain new L-Dex scores. If the score is > 6.5 points away from his/her baseline score indicating onset of subclinical lymphedema, it will be recommended to wear a compression garment for 4 weeks, 12 hours per day and then be reassessed. If the score continues to be > 6.5 points from baseline at reassessment, we will initiate lymphedema treatment. Assessing in this manner has a 95% rate of preventing clinically significant lymphedema.       Lindsay [name] - 11/05/19 0001    Open a tight or new jar Unable    Do heavy household chores (wash walls, wash floors) Mild difficulty    Carry a shopping bag or briefcase No difficulty    Wash your back Mild difficulty    Use a knife to cut food No difficulty    Recreational activities in which you take some force or impact through your arm, shoulder, or hand (golf, hammering, tennis) Mild difficulty    During the past week, to what extent has your arm, shoulder or hand problem interfered with your normal social activities with family, friends, neighbors, or groups? Slightly    During the past week, to what extent has your arm, shoulder or hand problem limited your work or other regular daily activities Slightly    Arm, shoulder, or hand pain. Mild    Tingling (pins and needles) in your arm, shoulder, or hand Mild    Difficulty Sleeping Mild difficulty    DASH Score 27.27 %            Objective measurements completed on  examination: See above findings.       Patient was instructed today in a home exercise program today for post op shoulder range of motion. These included active assist shoulder flexion in sitting, scapular retraction, wall walking with shoulder abduction, and hands behind head external rotation.  She was encouraged to do these twice a day, holding 3 seconds and repeating 5 times when permitted by her physician.            PT Education - 11/05/19 1511    Education Details Lymphedema risk reduction and post op shoulder ROM HEP    Person(s) Educated Patient    Methods Explanation;Demonstration;Handout    Comprehension Returned demonstration;Verbalized understanding               PT Long Term Goals - 11/05/19 1623      PT LONG TERM GOAL #1   Title Patient will demonstrate she has regained full shoulder ROM and function post operatively compared to baselines.    Time 8    Period Weeks    Status New    Target Date 12/31/19           Breast Clinic Goals - 11/05/19 1623      Patient will be able to verbalize understanding of pertinent lymphedema risk reduction practices relevant to her diagnosis specifically related to skin care.   Time 1    Period Days    Status Achieved      Patient will be able to return demonstrate and/or verbalize understanding of the post-op home exercise program related to regaining shoulder range of motion.   Time 1    Period Days    Status Achieved      Patient will be able to verbalize understanding of the importance of attending the postoperative After Breast Cancer Class for further lymphedema risk reduction education and therapeutic exercise.   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 There are 2 areas measuring 1 cm and 3.9 cm in the upper outer quadrant.  The Ki67 is 10%. Her multidisciplinary medical team met prior to her assessments to determine a recommended treatment plan. She is planning to have a left modified radical mastectomy followed by possible adjuvant chemotherapy and radiation. She will benefit from a post op PT reassessment to determine needs and from L-Dex screens every 3 months for 2 years to detect subclinical lymphedema.    Stability/Clinical Decision Making Stable/Uncomplicated    Clinical Decision Making Low    Rehab Potential Excellent    PT Frequency --   Eval and 1 post op f/u visit   PT Treatment/Interventions ADLs/Self Care Home Management;Therapeutic exercise;Patient/family education    PT Next Visit Plan Will reassess 3-4 weeks post op    PT Home Exercise Plan Post op shoulder ROM HEP    Consulted and Agree with Plan of Care Patient           Patient will benefit from skilled therapeutic intervention in order to improve the following deficits and impairments:  Postural dysfunction, Decreased range of motion, Decreased knowledge of precautions, Impaired UE functional use, Pain  Visit Diagnosis: Malignant neoplasm of upper-outer quadrant of left breast in female, estrogen receptor positive ([place]) - Plan: PT plan of care cert/re-cert  Abnormal posture - Plan: PT plan of care cert/re-cert   Patient will follow up at outpatient cancer rehab 3-4 weeks following surgery.  If the patient requires physical therapy at that time, a specific plan will be dictated and sent to the referring physician for approval. The patient was educated today on appropriate basic range of motion exercises to begin post operatively and the importance of attending the After Breast Cancer class following surgery.  Patient was educated today on lymphedema risk reduction practices as it pertains to recommendations that will benefit the patient immediately following surgery.  She verbalized good understanding.      Problem List Patient Active  Problem List   Diagnosis Date Noted  . Malignant neoplasm of upper-outer quadrant of left breast in female, estrogen receptor negative ([place]) 10/31/2019   Annia Friendly, PT 11/05/19 4:26 PM  [place], [place], [address] Phone: [phone]   Fax:  [phone]  Name: Lindsay [name] MRN: [id number] Date of Birth: 10/18/1955

## 2019-11-05 NOTE — Progress Notes (Signed)
Radiation Oncology         [phone] ________________________________  Initial outpatient Consultation  Name: Lindsay [name] MRN: [id number]  Date: 11/05/2019  DOB: Jan 24, 1956  QK:MMNOTR, Lubertha South, DO  [name], MD   REFERRING PHYSICIAN: Autumn Messing III, MD  DIAGNOSIS:    ICD-10-CM   1. Malignant neoplasm of upper-outer quadrant of left breast in female, estrogen receptor negative ([place])  C50.412    Z17.1    Cancer Staging Malignant neoplasm of upper-outer quadrant of left breast in female, estrogen receptor negative ([place]) Staging form: Breast, AJCC 8th Edition - Clinical stage from 10/28/2019: Stage IIIB (cT2, cN1, cM0, G3, ER-, PR-, HER2-) - Signed by Eppie Gibson, MD on 11/05/2019  CHIEF COMPLAINT: Here to discuss management of left breast cancer  HISTORY OF PRESENT ILLNESS::Lindsay [name] is a 64 y.o. female who presented with a palpable left breast lump.   Ultrasound of breast on 10/27/19 revealed: 2.6 cm right breast calcifications in upper slightly medial area; 3.9 cm left breast dominant mass at 3 o'clock with overlying skin thickening; 3 cm left breast mass/distorted tissue at 1 o'clock, likely in contiguity with dominant mass and overall spanning 6 cm; left breast distortion at 2 o'clock; two abnormal left axillary lymph nodes.     Left breast biopsies on date of 10/28/19 showed: invasive mammary carcinoma, with immunohistochemistry favoring desmoplastic response but metaplastic carcinoma not excluded; metastatic carcinoma in biopsied lymph node.  ER status: 0%; PR status 0%, Her2 status negative by FISH; Grade 3.  Right breast biopsy on 10/31/19 showed no malignancy-- focal usual ductal hyperplasia, fibrocystic changes with calcifications, and fibroadenomatoid changes.   She is in her usual state of health.  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  She has a deep faith.  I have personally reviewed her imaging at tumor board.  PREVIOUS RADIATION THERAPY:  No  PAST MEDICAL HISTORY:  has a past medical history of HCV (hepatitis C virus).    PAST SURGICAL HISTORY:History reviewed. No pertinent surgical history.  FAMILY HISTORY: family history includes Cancer in her cousin, maternal grandmother, maternal uncle, and paternal grandmother; Cancer (age of onset: 56) in her maternal aunt.  SOCIAL HISTORY:  reports that she has never smoked. She has never used smokeless tobacco. She reports that she does not drink alcohol and does not use drugs.  ALLERGIES: Patient has no allergy information on record.  MEDICATIONS:  Current Outpatient Medications  Medication Sig Dispense Refill   acetaminophen (TYLENOL) 650 MG CR tablet Take by mouth.     valACYclovir (VALTREX) 1000 MG tablet Take 1 tablet (1,000 mg total) by mouth 2 (two) times daily. 21 tablet 0   No current facility-administered medications for this encounter.    REVIEW OF SYSTEMS: As above   PHYSICAL EXAM:  vitals were not taken for this visit.   General: Alert and oriented, in no acute distress Skin: Left breast skin thickening Psychiatric: Judgment and insight are intact. Affect is appropriate. Breasts: The left breast is atrophied.  Left breast notable for a dominant mass that is approximately 5 cm in greatest dimension in the lower outer quadrant.  ++Skin thickening of the left breast.  Palpable mass in axilla measures approximately 2 cm.  Postbiopsy swelling changes in right breast. No other palpable masses appreciated in the breasts or axillae bilaterally.    ECOG = 0  0 - Asymptomatic (Fully active, able to carry on all predisease activities without restriction)  1 - Symptomatic but completely  ambulatory (Restricted in physically strenuous activity but ambulatory and able to carry out work of a light or sedentary nature. For example, light housework, office work)  2 - Symptomatic, <50% in bed during the day (Ambulatory and capable of all self care but unable to carry out any  work activities. Up and about more than 50% of waking hours)  3 - Symptomatic, >50% in bed, but not bedbound (Capable of only limited self-care, confined to bed or chair 50% or more of waking hours)  4 - Bedbound (Completely disabled. Cannot carry on any self-care. Totally confined to bed or chair)  5 - Death   Eustace Pen MM, Creech RH, Tormey DC, et al. [phone]). "Toxicity and response criteria of the [facility]". [place] Oncol. 5 (6): 649-55   LABORATORY DATA:  Lab Results  Component Value Date   WBC 5.1 11/05/2019   HGB 13.6 11/05/2019   HCT 40.7 11/05/2019   MCV 96.4 11/05/2019   PLT 152 11/05/2019   CMP     Component Value Date/Time   NA 143 11/05/2019 1215   K 4.6 11/05/2019 1215   CL 108 11/05/2019 1215   CO2 27 11/05/2019 1215   GLUCOSE 106 (H) 11/05/2019 1215   BUN 13 11/05/2019 1215   CREATININE 0.79 11/05/2019 1215   CALCIUM 9.3 11/05/2019 1215   PROT 6.6 11/05/2019 1215   ALBUMIN 4.0 11/05/2019 1215   AST 33 11/05/2019 1215   ALT 17 11/05/2019 1215   ALKPHOS 71 11/05/2019 1215   BILITOT 0.6 11/05/2019 1215   GFRNONAA >60 11/05/2019 1215   GFRAA >60 11/05/2019 1215         RADIOGRAPHY: US BREAST LTD UNI LEFT INC AXILLA  Result Date: 10/27/2019 CLINICAL DATA:  64 year old female presenting with a new lump in the left breast since November. EXAM: DIGITAL DIAGNOSTIC BILATERAL MAMMOGRAM WITH CAD AND TOMO ULTRASOUND LEFT BREAST COMPARISON:  None. ACR Breast Density Category b: There are scattered areas of fibroglandular density. FINDINGS: Mammogram: Right breast: Spot 2D magnification and full field mL views were performed in addition to standard views. There are loosely grouped round and amorphous calcifications in the superior slightly medial right breast spanning approximately 2.6 cm. No associated mass or asymmetry. There are no additional suspicious findings in the right breast. Left breast: Spot compression tomosynthesis views of the left  breast were performed in addition to standard views. A BB marks the palpable site of concern in the outer left breast. At the palpable site in the outer breast posterior depth there is an irregular spiculated mass spanning 3.5 cm with associated microcalcifications and distortion. There is an additional area of asymmetry/distortion which appears in contiguity with the dominant mass located more superiorly and medially. The overall abnormal area spans approximately 5.7 cm. There is associated skin thickening. More superiorly in the left breast seen only on the MLO view there is a far posterior area of distortion which is located approximately 7 cm away from the dominant mass. Additionally in the left axilla there is an asymmetry measuring 1.2 cm. Mammographic images were processed with CAD. On physical exam, I palpate a firm discrete mass and overlying skin thickening in the outer left breast. There is no color change within the skin. This corresponds to the lump reported by the patient. Ultrasound: Targeted ultrasound is performed in the left breast at 3 o'clock 6 cm from nipple at the palpable site of concern demonstrating a poorly defined irregular hypoechoic mass measuring 3.0 x 3.9  x 2.9 cm. This corresponds to the dominant mass identified mammographically. At 1 o'clock 4 cm from the nipple there is distorted abnormal tissue measuring approximately 3.0 x 1.2 x 2.9 cm. At 2 o'clock 10 cm from nipple there is an area of abnormal distorted tissue measuring 1.0 x 0.8 cm. This likely corresponds to the finding mammographically in the far posterior breast seen only on the MLO view. Targeted ultrasound of the left axilla demonstrates 2 abnormal lymph nodes with rounded appearance and eccentric cortical thickening. Cortical thickness measures up to 0.6 cm. There is a lymph node with surrounding increased echogenic tissue in the low axilla which likely corresponds to the asymmetry seen mammographically. IMPRESSION: 1.  Right breast calcifications spanning 2.6 cm in the upper slightly medial breast are indeterminate. 2. Left breast dominant mass at 3 o'clock measuring 3.9 cm is highly suspicious. There is overlying skin thickening, skin involvement is not excluded. 3. Left breast mass/distorted tissue at 1 o'clock measuring 3.0 cm is likely in contiguity with the dominant mass at 3 o'clock and is also highly suspicious. With the dominant lesion the overall span a disease is approximately 6 cm. 4. Left breast distortion at 2 o'clock 10 cm from the nipple is suspicious. 5.  Abnormal left axillary lymph nodes (2). RECOMMENDATION: 1. Stereotactic core needle biopsy of the right breast calcifications. 2. Ultrasound-guided core needle biopsy x3 for the left breast and axilla. Recommend targeting the dominant mass at 3 o'clock, the distortion at 2 o'clock as well as 1 of the abnormal axillary lymph nodes. 3.  MRI to characterize extent of disease. I have discussed the findings and recommendations with the patient. The patient will be scheduled for the biopsies prior to leaving our office today. BI-RADS CATEGORY  5: Highly suggestive of malignancy. Electronically Signed   By: Audie Pinto M.D.   On: 10/27/2019 14:08   MM DIAG BREAST TOMO BILATERAL  Result Date: 10/27/2019 CLINICAL DATA:  64 year old female presenting with a new lump in the left breast since November. EXAM: DIGITAL DIAGNOSTIC BILATERAL MAMMOGRAM WITH CAD AND TOMO ULTRASOUND LEFT BREAST COMPARISON:  None. ACR Breast Density Category b: There are scattered areas of fibroglandular density. FINDINGS: Mammogram: Right breast: Spot 2D magnification and full field mL views were performed in addition to standard views. There are loosely grouped round and amorphous calcifications in the superior slightly medial right breast spanning approximately 2.6 cm. No associated mass or asymmetry. There are no additional suspicious findings in the right breast. Left breast: Spot  compression tomosynthesis views of the left breast were performed in addition to standard views. A BB marks the palpable site of concern in the outer left breast. At the palpable site in the outer breast posterior depth there is an irregular spiculated mass spanning 3.5 cm with associated microcalcifications and distortion. There is an additional area of asymmetry/distortion which appears in contiguity with the dominant mass located more superiorly and medially. The overall abnormal area spans approximately 5.7 cm. There is associated skin thickening. More superiorly in the left breast seen only on the MLO view there is a far posterior area of distortion which is located approximately 7 cm away from the dominant mass. Additionally in the left axilla there is an asymmetry measuring 1.2 cm. Mammographic images were processed with CAD. On physical exam, I palpate a firm discrete mass and overlying skin thickening in the outer left breast. There is no color change within the skin. This corresponds to the lump reported by the patient. Ultrasound:  Targeted ultrasound is performed in the left breast at 3 o'clock 6 cm from nipple at the palpable site of concern demonstrating a poorly defined irregular hypoechoic mass measuring 3.0 x 3.9 x 2.9 cm. This corresponds to the dominant mass identified mammographically. At 1 o'clock 4 cm from the nipple there is distorted abnormal tissue measuring approximately 3.0 x 1.2 x 2.9 cm. At 2 o'clock 10 cm from nipple there is an area of abnormal distorted tissue measuring 1.0 x 0.8 cm. This likely corresponds to the finding mammographically in the far posterior breast seen only on the MLO view. Targeted ultrasound of the left axilla demonstrates 2 abnormal lymph nodes with rounded appearance and eccentric cortical thickening. Cortical thickness measures up to 0.6 cm. There is a lymph node with surrounding increased echogenic tissue in the low axilla which likely corresponds to the  asymmetry seen mammographically. IMPRESSION: 1. Right breast calcifications spanning 2.6 cm in the upper slightly medial breast are indeterminate. 2. Left breast dominant mass at 3 o'clock measuring 3.9 cm is highly suspicious. There is overlying skin thickening, skin involvement is not excluded. 3. Left breast mass/distorted tissue at 1 o'clock measuring 3.0 cm is likely in contiguity with the dominant mass at 3 o'clock and is also highly suspicious. With the dominant lesion the overall span a disease is approximately 6 cm. 4. Left breast distortion at 2 o'clock 10 cm from the nipple is suspicious. 5.  Abnormal left axillary lymph nodes (2). RECOMMENDATION: 1. Stereotactic core needle biopsy of the right breast calcifications. 2. Ultrasound-guided core needle biopsy x3 for the left breast and axilla. Recommend targeting the dominant mass at 3 o'clock, the distortion at 2 o'clock as well as 1 of the abnormal axillary lymph nodes. 3.  MRI to characterize extent of disease. I have discussed the findings and recommendations with the patient. The patient will be scheduled for the biopsies prior to leaving our office today. BI-RADS CATEGORY  5: Highly suggestive of malignancy. Electronically Signed   By: Audie Pinto M.D.   On: 10/27/2019 14:08   [place] AXILLARY NODE CORE BIOPSY LEFT  Addendum Date: 10/30/2019   ADDENDUM REPORT: 10/30/2019 14:03 ADDENDUM: Pathology revealed INVASIVE MAMMARY CARCINOMA of the LEFT breast, both locations, 3 o'clock, 5cmfn and 2 o'clock, 10cmfn. This was found to be concordant by Dr. Kristopher Oppenheim. 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 This was found to be concordant by Dr. Kristopher Oppenheim. Pathology results were discussed with the patient by telephone. The patient reported doing well after the biopsies with tenderness, bruising and swelling at the sites. Post biopsy instructions and care were reviewed and questions were answered. The patient was  encouraged to call [facility] [place] [facility] for any additional concerns. My direct phone number was provided for the patient. The patient was referred to [facility] at [facility] on November 05, 2019. Recommendation for a bilateral breast MRI to characterize extent of disease and the chest wall. The patient is scheduled for a RIGHT breast stereotatic guided biopsy on October 31, 2019. Further recommendations will be guided by the results of this biopsy. Pathology results reported by Terie Purser, RN on 10/30/2019. Electronically Signed   By: Kristopher Oppenheim M.D.   On: 10/30/2019 14:03   Result Date: 10/30/2019 CLINICAL DATA:  64 year old female with suspicious left breast masses and axillary lymph node. EXAM: ULTRASOUND GUIDED LEFT BREAST/AXILLA CORE NEEDLE BIOPSY X3 COMPARISON:  Previous exam(s). PROCEDURE: I  met with the patient and we discussed the procedure of ultrasound-guided biopsy, including benefits and alternatives. We discussed the high likelihood of a successful procedure. We discussed the risks of the procedure, including infection, bleeding, tissue injury, clip migration, and inadequate sampling. Informed written consent was given. The usual time-out protocol was performed immediately prior to the procedure. Lesion quadrant: Upper outer quadrant Using sterile technique and 1% Lidocaine as local anesthetic, under direct ultrasound visualization, a 12 gauge spring-loaded device was used to perform biopsy of a mass at the 3 o'clock position 5 cm from the nipple using a inferior approach. At the conclusion of the procedure ribbon shaped tissue marker clip was deployed into the biopsy cavity. Lesion quadrant: Upper outer quadrant Using sterile technique and 1% Lidocaine as local anesthetic, under direct ultrasound visualization, a 14 gauge spring-loaded device was used to perform biopsy of a mass/distortion at the 2 o'clock position 10  cm from the nipple using a lateral approach. At the conclusion of the procedure coil shaped tissue marker clip was deployed into the biopsy cavity. Using sterile technique and 1% Lidocaine as local anesthetic, under direct ultrasound visualization, a 14 gauge spring-loaded device was used to perform biopsy of a left axillary lymph node using a lateral approach. At the conclusion of the procedure [facility] tissue marker clip was deployed into the biopsy cavity. Follow up 2 view mammogram was performed and dictated separately. IMPRESSION: Ultrasound guided biopsy of the left breast x2 and left axilla. No apparent complications. Electronically Signed: By: Kristopher Oppenheim M.D. On: 10/28/2019 14:58   MM CLIP PLACEMENT LEFT  Result Date: 10/28/2019 CLINICAL DATA:  64 year old female status post ultrasound-guided biopsies of the left breast and axilla. EXAM: DIAGNOSTIC LEFT MAMMOGRAM POST ULTRASOUND BIOPSY COMPARISON:  Previous exam(s). FINDINGS: Mammographic images were obtained following ultrasound guided biopsy of the left breast and axilla. Ribbon and coil shaped clips are identified in the expected locations in the lateral and upper outer left breast, respectively. At the time of [facility] clip placement in the left axillary lymph node, there was a question of whether the clip slipped out of the biopsied lymph node. Therefore, the patient was taken back to the procedure suite and a second Q shaped clip was deployed into the lymph node. This was confirmed to be located within the lymph node on real-time scanning with ultrasound. Repeat mammographic views demonstrate the [facility] and Q shaped clips located adjacent to one another within a masslike asymmetry, likely representing the biopsied node. IMPRESSION: 1. Appropriate positioning of ribbon and coil shaped clips within the sites of biopsy in the left breast. 2. Appropriate positioning of a Q and HydroMARK clip within a left axillary lymph node. Final Assessment:  Post Procedure Mammograms for Marker Placement Electronically Signed   By: Kristopher Oppenheim M.D.   On: 10/28/2019 15:32   MM CLIP PLACEMENT RIGHT  Result Date: 10/31/2019 CLINICAL DATA:  64 year old female status post stereotactic biopsy of right breast calcifications. EXAM: DIAGNOSTIC RIGHT MAMMOGRAM POST STEREOTACTIC BIOPSY COMPARISON:  Previous exam(s). FINDINGS: Mammographic images were obtained following stereotactic guided biopsy of of right breast calcifications. The biopsy marking clip is in expected position at the site of biopsy. IMPRESSION: Appropriate positioning of the coil shaped biopsy marking clip at the site of biopsy in the upper-outer right breast. Final Assessment: Post Procedure Mammograms for Marker Placement Electronically Signed   By: Kristopher Oppenheim M.D.   On: 10/31/2019 14:52   [place] LT BREAST BX W LOC DEV 1ST LESION IMG BX  SPEC US GUIDE  Addendum Date: 10/30/2019   ADDENDUM REPORT: 10/30/2019 14:03 ADDENDUM: Pathology revealed INVASIVE MAMMARY CARCINOMA of the LEFT breast, both locations, 3 o'clock, 5cmfn and 2 o'clock, 10cmfn. This was found to be concordant by Dr. Kristopher Oppenheim. 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 This was found to be concordant by Dr. Kristopher Oppenheim. Pathology results were discussed with the patient by telephone. The patient reported doing well after the biopsies with tenderness, bruising and swelling at the sites. Post biopsy instructions and care were reviewed and questions were answered. The patient was encouraged to call [facility] [place] for any additional concerns. My direct phone number was provided for the patient. The patient was referred to [facility] at [facility] on November 05, 2019. Recommendation for a bilateral breast MRI to characterize extent of disease and the chest wall. The patient is scheduled for a RIGHT breast stereotatic guided  biopsy on October 31, 2019. Further recommendations will be guided by the results of this biopsy. Pathology results reported by Terie Purser, RN on 10/30/2019. Electronically Signed   By: Kristopher Oppenheim M.D.   On: 10/30/2019 14:03   Result Date: 10/30/2019 CLINICAL DATA:  64 year old female with suspicious left breast masses and axillary lymph node. EXAM: ULTRASOUND GUIDED LEFT BREAST/AXILLA CORE NEEDLE BIOPSY X3 COMPARISON:  Previous exam(s). PROCEDURE: I met with the patient and we discussed the procedure of ultrasound-guided biopsy, including benefits and alternatives. We discussed the high likelihood of a successful procedure. We discussed the risks of the procedure, including infection, bleeding, tissue injury, clip migration, and inadequate sampling. Informed written consent was given. The usual time-out protocol was performed immediately prior to the procedure. Lesion quadrant: Upper outer quadrant Using sterile technique and 1% Lidocaine as local anesthetic, under direct ultrasound visualization, a 12 gauge spring-loaded device was used to perform biopsy of a mass at the 3 o'clock position 5 cm from the nipple using a inferior approach. At the conclusion of the procedure ribbon shaped tissue marker clip was deployed into the biopsy cavity. Lesion quadrant: Upper outer quadrant Using sterile technique and 1% Lidocaine as local anesthetic, under direct ultrasound visualization, a 14 gauge spring-loaded device was used to perform biopsy of a mass/distortion at the 2 o'clock position 10 cm from the nipple using a lateral approach. At the conclusion of the procedure coil shaped tissue marker clip was deployed into the biopsy cavity. Using sterile technique and 1% Lidocaine as local anesthetic, under direct ultrasound visualization, a 14 gauge spring-loaded device was used to perform biopsy of a left axillary lymph node using a lateral approach. At the conclusion of the procedure [facility] tissue marker clip was  deployed into the biopsy cavity. Follow up 2 view mammogram was performed and dictated separately. IMPRESSION: Ultrasound guided biopsy of the left breast x2 and left axilla. No apparent complications. Electronically Signed: By: Kristopher Oppenheim M.D. On: 10/28/2019 14:58   [place] LT BREAST BX W LOC DEV EA ADD LESION IMG BX SPEC US GUIDE  Addendum Date: 10/30/2019   ADDENDUM REPORT: 10/30/2019 14:03 ADDENDUM: Pathology revealed INVASIVE MAMMARY CARCINOMA of the LEFT breast, both locations, 3 o'clock, 5cmfn and 2 o'clock, 10cmfn. This was found to be concordant by Dr. Kristopher Oppenheim. 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 This was found to be concordant by Dr. Kristopher Oppenheim. Pathology results were discussed with the patient by telephone. The patient reported doing well  after the biopsies with tenderness, bruising and swelling at the sites. Post biopsy instructions and care were reviewed and questions were answered. The patient was encouraged to call [facility] [place] for any additional concerns. My direct phone number was provided for the patient. The patient was referred to [facility] on November 05, 2019. Recommendation for a bilateral breast MRI to characterize extent of disease and the chest wall. The patient is scheduled for a RIGHT breast stereotatic guided biopsy on October 31, 2019. Further recommendations will be guided by the results of this biopsy. Pathology results reported by Terie Purser, RN on 10/30/2019. Electronically Signed   By: Kristopher Oppenheim M.D.   On: 10/30/2019 14:03   Result Date: 10/30/2019 CLINICAL DATA:  64 year old female with suspicious left breast masses and axillary lymph node. EXAM: ULTRASOUND GUIDED LEFT BREAST/AXILLA CORE NEEDLE BIOPSY X3 COMPARISON:  Previous exam(s). PROCEDURE: I met with the patient and we discussed the procedure of ultrasound-guided biopsy,  including benefits and alternatives. We discussed the high likelihood of a successful procedure. We discussed the risks of the procedure, including infection, bleeding, tissue injury, clip migration, and inadequate sampling. Informed written consent was given. The usual time-out protocol was performed immediately prior to the procedure. Lesion quadrant: Upper outer quadrant Using sterile technique and 1% Lidocaine as local anesthetic, under direct ultrasound visualization, a 12 gauge spring-loaded device was used to perform biopsy of a mass at the 3 o'clock position 5 cm from the nipple using a inferior approach. At the conclusion of the procedure ribbon shaped tissue marker clip was deployed into the biopsy cavity. Lesion quadrant: Upper outer quadrant Using sterile technique and 1% Lidocaine as local anesthetic, under direct ultrasound visualization, a 14 gauge spring-loaded device was used to perform biopsy of a mass/distortion at the 2 o'clock position 10 cm from the nipple using a lateral approach. At the conclusion of the procedure coil shaped tissue marker clip was deployed into the biopsy cavity. Using sterile technique and 1% Lidocaine as local anesthetic, under direct ultrasound visualization, a 14 gauge spring-loaded device was used to perform biopsy of a left axillary lymph node using a lateral approach. At the conclusion of the procedure [facility] tissue marker clip was deployed into the biopsy cavity. Follow up 2 view mammogram was performed and dictated separately. IMPRESSION: Ultrasound guided biopsy of the left breast x2 and left axilla. No apparent complications. Electronically Signed: By: Kristopher Oppenheim M.D. On: 10/28/2019 14:58   MM RT BREAST BX W LOC DEV 1ST LESION IMAGE BX SPEC STEREO GUIDE  Addendum Date: 11/03/2019   ADDENDUM REPORT: 11/03/2019 14:09 ADDENDUM: Pathology revealed FOCAL USUAL DUCTAL HYPERPLASIA AND FIBROCYSTIC CHANGES WITH CALCIFICATIONS, FIBROADENOMATOID CHANGES of the  RIGHT breast, upper outer quadrant, posterior. This was found to be concordant by Dr. Kristopher Oppenheim. Pathology results were discussed with the patient by telephone. The patient reported doing well after the biopsy with tenderness at the site. Post biopsy instructions and care were reviewed and questions were answered. The patient was encouraged to call [facility] for any additional concerns. The patient has a recent diagnosis of left breast cancer and should follow her outlined treatment plan. Pathology results reported by Stacie Acres RN on 11/03/2019. Electronically Signed   By: Kristopher Oppenheim M.D.   On: 11/03/2019 14:09   Result Date: 11/03/2019 CLINICAL DATA:  64 year old female with recently diagnosed left breast cancer presents for stereotactic  biopsy of indeterminate right breast calcifications. EXAM: RIGHT BREAST STEREOTACTIC CORE NEEDLE BIOPSY COMPARISON:  Previous exams. FINDINGS: The patient and I discussed the procedure of stereotactic-guided biopsy including benefits and alternatives. We discussed the high likelihood of a successful procedure. We discussed the risks of the procedure including infection, bleeding, tissue injury, clip migration, and inadequate sampling. Informed written consent was given. The usual time out protocol was performed immediately prior to the procedure. Using sterile technique and 1% Lidocaine as local anesthetic, under stereotactic guidance, a 9 gauge vacuum assisted device was used to perform core needle biopsy of calcifications in the upper-outer quadrant of the right breast using a superior approach. Specimen radiograph was performed showing calcifications in several specimens. Specimens with calcifications are identified for pathology. Lesion quadrant: Upper outer quadrant At the conclusion of the procedure, coil shaped tissue marker clip was deployed into the biopsy cavity. Follow-up 2-view mammogram was performed and dictated separately.  IMPRESSION: Stereotactic-guided biopsy of right breast calcifications. No apparent complications. Electronically Signed: By: Kristopher Oppenheim M.D. On: 10/31/2019 14:51      IMPRESSION/PLAN: This is a delightful woman with new diagnosis of locally advanced left Breast Cancer   This appears to be a very aggressive cancer.  Staging scans are pending.   She understands that, in the absence of distant metastatic disease, modified radical mastectomy is recommended.  This will be followed by chemotherapy.  Radiotherapy to the breast and regional nodes would follow chemotherapy.  It was a pleasure meeting the patient today. We discussed the risks, benefits, and side effects of radiotherapy. I recommend radiotherapy to the left breast and regional lymph nodes to reduce her risk of locoregional recurrence by 2/3.  We discussed that radiation would take approximately 6 weeks to complete and that I would give the patient a few weeks to heal following surgery before starting treatment planning. We spoke about acute effects including skin irritation and fatigue as well as much less common late effects including internal organ injury or irritation. We spoke about the latest technology that is used to minimize the risk of late effects for patients undergoing radiotherapy to the breast or chest wall. No guarantees of treatment were given. The patient is enthusiastic about proceeding with treatment. I look forward to participating in the patient's care.  I will await her referral back to me for postoperative follow-up and eventual CT simulation/treatment planning.  I wished her the very best until I see her back in my clinic.  She has a very strong faith and resilient attitude.  On date of service, in total, I spent 50 minutes on this encounter. Patient was seen in person.  __________________________________________   Eppie Gibson, MD   This document serves as a record of services personally performed by Eppie Gibson, MD. It was created on her behalf by Wilburn Mylar, a trained medical scribe. The creation of this record is based on the scribe's personal observations and the provider's statements to them. This document has been checked and approved by the attending provider.

## 2019-11-05 NOTE — Patient Instructions (Signed)

## 2019-11-06 ENCOUNTER — Encounter: Payer: Self-pay | Admitting: Genetic Counselor

## 2019-11-06 ENCOUNTER — Encounter: Payer: Self-pay | Admitting: General Practice

## 2019-11-06 DIAGNOSIS — Z803 Family history of malignant neoplasm of breast: Secondary | ICD-10-CM | POA: Insufficient documentation

## 2019-11-06 DIAGNOSIS — Z808 Family history of malignant neoplasm of other organs or systems: Secondary | ICD-10-CM | POA: Insufficient documentation

## 2019-11-06 DIAGNOSIS — Z8042 Family history of malignant neoplasm of prostate: Secondary | ICD-10-CM | POA: Insufficient documentation

## 2019-11-06 NOTE — Progress Notes (Signed)
REFERRING PROVIDER: Truitt Merle, MD [place],  [place] [address]  PRIMARY PROVIDER:  Octavio Graves P, DO  PRIMARY REASON FOR VISIT:  1. Malignant neoplasm of upper-outer quadrant of left breast in female, estrogen receptor negative ([place])   2. Family history of breast cancer   3. Family history of prostate cancer   4. Family history of bone cancer      HISTORY OF PRESENT ILLNESS:   Lindsay [name], a 64 y.o. female, was seen for a [place] cancer genetics consultation at the request of Dr. Burr Medico due to a personal and family history of cancer.  Lindsay [name] presents to clinic today to discuss the possibility of a hereditary predisposition to cancer, genetic testing, and to further clarify her future cancer risks, as well as potential cancer risks for family members.   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 The treatment plan includes surgery and likely adjuvant chemotherapy.    CANCER HISTORY:  Oncology History Overview Note  Cancer Staging Malignant neoplasm of upper-outer quadrant of left breast in female, estrogen receptor negative ([place]) Staging form: Breast, AJCC 8th Edition - Clinical stage from 10/28/2019: cT1b, cN1, cM0, GX, ER-, PR-, HER2- - Signed by Truitt Merle, MD on 11/04/2019    Malignant neoplasm of upper-outer quadrant of left breast in female, estrogen receptor negative ([place])  10/27/2019 Mammogram   IMPRESSION: 1. Right breast calcifications spanning 2.6 cm in the upper slightly medial breast are indeterminate.   2. Left breast dominant mass at 3 o'clock, 6 cm from nipple measuring 3x3.9x2.9 cm is highly suspicious. There is overlying skin thickening, skin involvement is not excluded.   3. Left breast mass/distorted tissue at 1 o'clock, 4cm from nipple measuring 3.0x1.2x2.9 cm is likely in contiguity with the dominant mass at 3 o'clock and is also highly suspicious. With  the dominant lesion the overall span a disease is approximately 6 cm.   4. Left breast distortion at 2 o'clock 10 cm from the nipple is Suspicious, measuring 1.0 x 0.8 cm.   5.  Abnormal left axillary lymph nodes (2). Cortical thickness measures up to 0.6 cm.    10/28/2019 Initial Biopsy   Diagnosis 1. Breast, left, needle core biopsy, 3 o'clock, 5cmfn - INVASIVE MAMMARY CARCINOMA. 2. Breast, left, needle core biopsy, 2 o'clock, 10cmfn - INVASIVE MAMMARY CARCINOMA. 3. 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 Microscopic Comment 1. -3. Overall, immunohistochemistry favors a pronounced desmoplastic response, but metaplastic carcinoma cannot be excluded (Epithelioid component: CKAE1AE3 strong +, CK5/6 weak +). The greatest linear extent of tumor in any one core in specimen 1 is 14 mm. The greatest linear extent of tumor in any one core in specimen 2 is 16 mm.   10/28/2019 Receptors her2   3. PROGNOSTIC INDICATORS Results: IMMUNOHISTOCHEMICAL AND MORPHOMETRIC ANALYSIS PERFORMED MANUALLY The tumor cells are EQUIVOCAL for Her2 (2+). Her2 by FISH will be performed and results reported separately. Estrogen Receptor: 0%, NEGATIVE Progesterone Receptor: 0%, NEGATIVE Proliferation Marker Ki67: 10%    3. FLUORESCENCE IN-SITU HYBRIDIZATION Results: GROUP 5: HER2 **NEGATIVE** Equivocal form of amplification of the HER2 gene was detected in the IHC 2+ tissue sample received from this individual. HER2 FISH was performed by a technologist and cell imaging and analysis on the BioView.   10/28/2019 Cancer Staging   Staging form: Breast, AJCC 8th Edition - Clinical stage from  10/28/2019: Stage IIIB (cT2, cN1, cM0, G3, ER-, PR-, HER2-) - Signed by Eppie Gibson, MD on 11/05/2019   10/31/2019 Initial Diagnosis   Malignant neoplasm of upper-outer quadrant of left breast in female, estrogen receptor negative ([place])   10/31/2019 Pathology Results    Diagnosis Breast, right, needle core biopsy, UOQ, posterior - FOCAL USUAL DUCTAL HYPERPLASIA AND FIBROCYSTIC CHANGES WITH CALCIFICATIONS - FIBROADENOMATOID CHANGES - NO MALIGNANCY IDENTIFIED Microscopic Comment These results were called to [facility] on November 03, 2019.      RISK FACTORS:  Menarche was at age 60.  No live births.  OCP use for approximately 1 years.  Ovaries intact: yes.  Hysterectomy: no.  Menopausal status: postmenopausal.  HRT use: 0 years. Colonoscopy: no. Mammogram within the last year: yes.   Past Medical History:  Diagnosis Date   Family history of bone cancer    Family history of breast cancer    Family history of prostate cancer    HCV (hepatitis C virus)     No past surgical history on file.  Social History   Socioeconomic History   Marital status: Single    Spouse name: Not on file   Number of children: Not on file   Years of education: Not on file   Highest education level: Not on file  Occupational History   Not on file  Tobacco Use   Smoking status: Never Smoker   Smokeless tobacco: Never Used  Substance and Sexual Activity   Alcohol use: Never   Drug use: Never   Sexual activity: Not on file  Other Topics Concern   Not on file  Social History Narrative   Not on file   Social Determinants of Health   Financial Resource Strain:    Difficulty of Paying Living Expenses:   Food Insecurity:    Worried About [place] in the Last Year:    Arboriculturist in the Last Year:   Transportation Needs:    Film/video editor (Medical):    Lack of Transportation (Non-Medical):   Physical Activity:    Days of Exercise per Week:    Minutes of Exercise per Session:   Stress:    Feeling of Stress :   Social Connections:    Frequency of Communication with Friends and Family:    Frequency of Social Gatherings with Friends and Family:    Attends Religious Services:    Active  Member of Clubs or Organizations:    Attends Music therapist:    Marital Status:      FAMILY HISTORY:  We obtained a detailed, 4-generation family history.  Significant diagnoses are listed below: Family History  Problem Relation Age of Onset   Breast cancer Maternal Aunt 78   Prostate cancer Maternal Uncle        dx. in his 61s   Bone cancer Maternal Grandmother        dx. 33   Cancer Paternal Grandmother        possible cancer, unknown type    Prostate cancer Cousin        maternal first cousin   Heart Problems Brother    Bipolar disorder Brother    Cancer Cousin        unknown type, paternal first cousin   Lindsay [name] does not have children. She had one brother who died at the age of 65 with a history of heart problems and bipolar disorder.   Lindsay [name]  mother died at the age of 34 and did not have cancer. Lindsay [name] had six maternal aunts and seven maternal uncles. One aunt had breast cancer diagnosed in her 47s. One uncle had prostate cancer diagnosed in his 33s. She has one maternal cousin who has had prostate cancer. Her maternal grandmother died at the age of 30 from bone cancer. Her maternal grandfather died in his 39s.  Lindsay [name] father died at the age of 16 and did not have cancer. She had three paternal uncles and three paternal aunts, none of whom had cancer. She had one paternal cousin who was diagnosed with cancer (primary unknown) in his 93s. Lindsay [name] paternal grandmother died in her 55s from an unknown cancer, and her paternal grandfather died in his 9s without cancer.  Lindsay [name] is unaware of previous family history of genetic testing for hereditary cancer risks. Patient's maternal ancestors are of [place] descent, and paternal ancestors are of [place] descent. There is no reported Ashkenazi Jewish ancestry. There is no known consanguinity.  GENETIC COUNSELING ASSESSMENT: Lindsay [name] is a 64 y.o. female with a personal history of breast  cancer and a family history of breast cancer and prostate cancer, which is somewhat suggestive of a hereditary cancer syndrome and predisposition to cancer. We, therefore, discussed and recommended the following at today's visit.   DISCUSSION: We discussed that approximately 5-10% of breast cancer is hereditary, with most cases associated with the BRCA1 and BRCA2 genes. There are other genes that can be associated with hereditary breast cancer syndromes. These include ATM, CHEK2, PALB2, etc. We discussed that testing is beneficial for several reasons, including knowing about other cancer risks, identifying potential screening and risk-reduction options that may be appropriate, and to understand if other family members could be at risk for cancer and allow them to undergo genetic testing.    We reviewed the characteristics, features and inheritance patterns of hereditary cancer syndromes. We also discussed genetic testing, including the appropriate family members to test, the process of testing, insurance coverage and turn-around-time for results. We discussed the implications of a negative vs a positive result. We recommended Lindsay [name] pursue genetic testing for a hereditary cancer gene panel.   Based on Ms. Buckner's personal and family history of cancer, she meets medical criteria for genetic testing. Despite that she meets criteria, there may still be an out of pocket cost.   PLAN:  Despite our recommendation, Lindsay [name] did not wish to pursue genetic testing at today's visit. We understand this decision and remain available to coordinate genetic testing at any time in the future. We, therefore, recommend Lindsay [name] continue to follow the cancer screening guidelines given by her primary healthcare provider.  Lastly, we encouraged Lindsay [name] to remain in contact with cancer genetics so that we can continuously update the family history and inform her of any changes in cancer genetics and testing that may be  of benefit for this family.   Lindsay [name] questions were answered to her satisfaction today. Our contact information was provided should additional questions or concerns arise. Thank you for the referral and allowing [place] to share in the care of your patient.   Clint Guy, [place], [facility] Licensed, Certified Dispensing [email or web address] Phone: [phone]  The patient was seen for a total of 25 minutes in face-to-face genetic counseling.  This patient was discussed with Drs. Magrinat, Lindi Adie and/or Burr Medico who agrees with the above.    _______________________________________________________________________ For Office Staff:  Number of people involved  in session: 1 Was an Intern/ student involved with case: no

## 2019-11-06 NOTE — Progress Notes (Signed)
[place] Psychosocial Distress Screening Spiritual Care  Met with Lindsay [name] in [facility] to introduce [facility] team/resources, reviewing distress screen per protocol.  The patient scored a 8 on the Psychosocial Distress Thermometer which indicates severe distress. Also assessed for distress and other psychosocial needs.   ONCBCN DISTRESS SCREENING 11/06/2019  Screening Type Initial Screening  Distress experienced in past week (1-10) 8  Practical problem type Insurance;Work/school;Transportation  Emotional problem type Depression;Adjusting to illness  Spiritual/Religous concerns type Relating to God  Physical Problem type Pain;Sleep/insomnia;Constipation/diarrhea  Referral to support programs Yes   [name] reports that she feels "a whole lot better" after receiving information and meeting team in [facility]. She repots good prayer support and uses her faith for coping and making meaning. She has a degree in Therapist, sports and runs a Science writer.  **One of her top priorities is being able to participate in her annual family vacation July 18-25 at a [facility] in [place], [place]. Her goal is to have surgery after July 27.**  Follow up needed: No. Per [name], no other needs or concerns at this time, but she is aware of ongoing Team availability should needs or questions arise.   [place], [place], [facility] Pager [phone] Voicemail [phone]

## 2019-11-07 ENCOUNTER — Telehealth: Payer: Self-pay | Admitting: Hematology

## 2019-11-07 NOTE — Telephone Encounter (Signed)
No 6/30 los.  

## 2019-11-13 ENCOUNTER — Telehealth: Payer: Self-pay | Admitting: *Deleted

## 2019-11-13 ENCOUNTER — Encounter: Payer: Self-pay | Admitting: *Deleted

## 2019-11-13 NOTE — Telephone Encounter (Signed)
Left message for a return phone call to follow up from [facility] and assess navigation needs. 

## 2019-11-16 ENCOUNTER — Encounter: Payer: Self-pay | Admitting: Dietician

## 2019-11-16 NOTE — Progress Notes (Signed)
Nutrition  Patient identified after attending Breast Clinic on 11/05/2019. Patient given nutrition packet with RD contact information by nurse navigator at that time.  Chart reviewed.   Patient with newly diagnosed malignant neoplasm of upper-outer quadrant of left breast, triple negative. Plans for left mastectomy with ALND, recommended adjuvant radiation therapy to reduce risk of recurrence, followed likely by recommendation of chemotherapy.   Patient is not currently at nutrition risk. Please consult RD if nutrition issues arise.  Lajuan Lines, RD, LDN Clinical Nutrition After Hours/Weekend Pager # in [place]

## 2019-11-18 ENCOUNTER — Telehealth: Payer: Self-pay | Admitting: Hematology

## 2019-11-18 NOTE — Telephone Encounter (Signed)
Scheduled appt per 7/12 sch msg - mailed letter with appt date and time

## 2019-11-21 ENCOUNTER — Encounter (HOSPITAL_COMMUNITY)
Admission: RE | Admit: 2019-11-21 | Discharge: 2019-11-21 | Disposition: A | Discharge status: Home / Self Care | Payer: Medicare Other | Source: Ambulatory Visit | Attending: Hematology | Admitting: Hematology

## 2019-11-21 ENCOUNTER — Other Ambulatory Visit: Payer: Self-pay

## 2019-11-21 DIAGNOSIS — M8448XA Pathological fracture, other site, initial encounter for fracture: Secondary | ICD-10-CM | POA: Insufficient documentation

## 2019-11-21 DIAGNOSIS — Z171 Estrogen receptor negative status [ER-]: Secondary | ICD-10-CM | POA: Insufficient documentation

## 2019-11-21 DIAGNOSIS — I700 Atherosclerosis of aorta: Secondary | ICD-10-CM | POA: Diagnosis not present

## 2019-11-21 DIAGNOSIS — C50412 Malignant neoplasm of upper-outer quadrant of left female breast: Secondary | ICD-10-CM | POA: Diagnosis present

## 2019-11-21 DIAGNOSIS — K760 Fatty (change of) liver, not elsewhere classified: Secondary | ICD-10-CM | POA: Insufficient documentation

## 2019-11-21 MED ORDER — SODIUM CHLORIDE (PF) 0.9 % IJ SOLN
INTRAMUSCULAR | Status: AC
  Filled 2019-11-21: qty 50

## 2019-11-21 MED ORDER — IOHEXOL 300 MG/ML  SOLN
100.0000 mL | Freq: Once | INTRAMUSCULAR | Status: AC | PRN
  Administered 2019-11-21: 100 mL via INTRAVENOUS

## 2019-11-21 MED ORDER — TECHNETIUM TC 99M MEDRONATE IV KIT
22.0000 | PACK | Freq: Once | INTRAVENOUS | Status: AC | PRN
  Administered 2019-11-21: 22 via INTRAVENOUS

## 2019-11-24 ENCOUNTER — Telehealth: Payer: Self-pay | Admitting: *Deleted

## 2019-11-24 ENCOUNTER — Encounter: Payer: Self-pay | Admitting: *Deleted

## 2019-11-24 NOTE — Telephone Encounter (Signed)
Spoke with patient to inform her of her scan results.  She was very happy with the good news.

## 2019-11-28 ENCOUNTER — Encounter: Payer: Self-pay | Admitting: *Deleted

## 2019-12-22 NOTE — Progress Notes (Signed)
[place], [address] [place] [address] Phone: [phone] Fax: [phone]      Your procedure is scheduled on August 20  Report to [facility] Main Entrance "A" at 1100 A.M., and check in at the Admitting office.  Call this number if you have problems the morning of surgery:  [phone]  Call [phone] if you have any questions prior to your surgery date Monday-Friday 8am-4pm    Remember:  Do not eat after midnight the night before your surgery  You may drink clear liquids until 1000 am the morning of your surgery.   Clear liquids allowed are: Water, Non-Citrus Juices (without pulp), Carbonated Beverages, Clear Tea, Black Coffee Only, and Gatorade    Take these medicines the morning of surgery with A SIP OF WATER  acetaminophen (TYLENOL)  If needed   As of today, STOP taking any Aspirin (unless otherwise instructed by your surgeon) Aleve, Naproxen, Ibuprofen, Motrin, Advil, Goody's, BC's, all herbal medications, fish oil, and all vitamins.                      Do not wear jewelry, make up, or nail polish            Do not wear lotions, powders, perfumes/colognes, or deodorant.            Do not shave 48 hours prior to surgery.             Do not bring valuables to the hospital.            [facility] is not responsible for any belongings or valuables.  Do NOT Smoke (Tobacco/Vaping) or drink Alcohol 24 hours prior to your procedure If you use a CPAP at night, you may bring all equipment for your overnight stay.   Contacts, glasses, dentures or bridgework may not be worn into surgery.      For patients admitted to the hospital, discharge time will be determined by your treatment team.   Patients discharged the day of surgery will not be allowed to drive home, and someone needs to stay with them for 24 hours.    Special instructions:   [place]- Preparing For Surgery  Before surgery, you can play an  important role. Because skin is not sterile, your skin needs to be as free of germs as possible. You can reduce the number of germs on your skin by washing with CHG (chlorahexidine gluconate) Soap before surgery.  CHG is an antiseptic cleaner which kills germs and bonds with the skin to continue killing germs even after washing.    Oral Hygiene is also important to reduce your risk of infection.  Remember - BRUSH YOUR TEETH THE MORNING OF SURGERY WITH YOUR REGULAR TOOTHPASTE  Please do not use if you have an allergy to CHG or antibacterial soaps. If your skin becomes reddened/irritated stop using the CHG.  Do not shave (including legs and underarms) for at least 48 hours prior to first CHG shower. It is OK to shave your face.  Please follow these instructions carefully.   1. Shower the NIGHT BEFORE SURGERY and the MORNING OF SURGERY with CHG Soap.   2. If you chose to wash your hair, wash your hair first as usual with your normal shampoo.  3. After you shampoo, rinse your hair and body thoroughly to remove the shampoo.  4. Use CHG as you would any other liquid soap. You  can apply CHG directly to the skin and wash gently with a scrungie or a clean washcloth.   5. Apply the CHG Soap to your body ONLY FROM THE NECK DOWN.  Do not use on open wounds or open sores. Avoid contact with your eyes, ears, mouth and genitals (private parts). Wash Face and genitals (private parts)  with your normal soap.   6. Wash thoroughly, paying special attention to the area where your surgery will be performed.  7. Thoroughly rinse your body with warm water from the neck down.  8. DO NOT shower/wash with your normal soap after using and rinsing off the CHG Soap.  9. Pat yourself dry with a CLEAN TOWEL.  10. Wear CLEAN PAJAMAS to bed the night before surgery  11. Place CLEAN SHEETS on your bed the night of your first shower and DO NOT SLEEP WITH PETS.   Day of Surgery: Wear Clean/Comfortable clothing the  morning of surgery Do not apply any deodorants/lotions.   Remember to brush your teeth WITH YOUR REGULAR TOOTHPASTE.   Please read over the following fact sheets that you were given.

## 2019-12-23 ENCOUNTER — Encounter (HOSPITAL_COMMUNITY)
Admission: RE | Admit: 2019-12-23 | Discharge: 2019-12-23 | Disposition: A | Discharge status: Home / Self Care | Payer: Medicare Other | Source: Ambulatory Visit | Attending: General Surgery | Admitting: General Surgery

## 2019-12-23 ENCOUNTER — Encounter (HOSPITAL_COMMUNITY): Payer: Self-pay

## 2019-12-23 ENCOUNTER — Other Ambulatory Visit (HOSPITAL_COMMUNITY)
Admission: RE | Admit: 2019-12-23 | Discharge: 2019-12-23 | Disposition: A | Discharge status: Home / Self Care | Payer: Medicare Other | Source: Ambulatory Visit | Attending: General Surgery | Admitting: General Surgery

## 2019-12-23 ENCOUNTER — Other Ambulatory Visit: Payer: Self-pay

## 2019-12-23 DIAGNOSIS — Z20822 Contact with and (suspected) exposure to covid-19: Secondary | ICD-10-CM | POA: Insufficient documentation

## 2019-12-23 DIAGNOSIS — Z01812 Encounter for preprocedural laboratory examination: Secondary | ICD-10-CM | POA: Diagnosis present

## 2019-12-23 HISTORY — DX: Bronchitis, not specified as acute or chronic: J40

## 2019-12-23 HISTORY — DX: Fibromyalgia: M79.7

## 2019-12-23 LAB — CBC
HCT: 45.1 % (ref 36.0–46.0)
Hemoglobin: 14.6 g/dL (ref 12.0–15.0)
MCH: 31.7 pg (ref 26.0–34.0)
MCHC: 32.4 g/dL (ref 30.0–36.0)
MCV: 97.8 fL (ref 80.0–100.0)
Platelets: 168 10*3/uL (ref 150–400)
RBC: 4.61 MIL/uL (ref 3.87–5.11)
RDW: 12.7 % (ref 11.5–15.5)
WBC: 7 10*3/uL (ref 4.0–10.5)
nRBC: 0 % (ref 0.0–0.2)

## 2019-12-23 LAB — BASIC METABOLIC PANEL
Anion gap: 7 (ref 5–15)
BUN: 10 mg/dL (ref 8–23)
CO2: 29 mmol/L (ref 22–32)
Calcium: 9.5 mg/dL (ref 8.9–10.3)
Chloride: 105 mmol/L (ref 98–111)
Creatinine, Ser: 0.69 mg/dL (ref 0.44–1.00)
GFR calc Af Amer: >60 mL/min (ref >60)
GFR calc non Af Amer: >60 mL/min (ref >60)
Glucose, Bld: 117 mg/dL — ABNORMAL HIGH (ref 70–99)
Potassium: 5.3 mmol/L — ABNORMAL HIGH (ref 3.5–5.1)
Sodium: 141 mmol/L (ref 135–145)

## 2019-12-23 LAB — SARS CORONAVIRUS 2 (TAT 6-24 HRS): SARS Coronavirus 2: NEGATIVE

## 2019-12-23 NOTE — Progress Notes (Signed)
PCP - Dr. Lenor Derrick Cardiologist - Denies  Chest x-ray - Not indicated EKG - Not indicated Stress Test - Denies ECHO - Denies Cardiac Cath - Denies  Sleep Study - Denies DM - Denies  ERAS Protcol - Instruction given  COVID TEST- 12/23/19  Anesthesia review: No  Patient denies shortness of breath, fever, cough and chest pain at PAT appointment   All instructions explained to the patient, with a verbal understanding of the material. Patient agrees to go over the instructions while at home for a better understanding. Patient also instructed to self quarantine after being tested for COVID-19. The opportunity to ask questions was provided.

## 2019-12-26 ENCOUNTER — Ambulatory Visit (HOSPITAL_COMMUNITY): Payer: Medicare Other | Admitting: Anesthesiology

## 2019-12-26 ENCOUNTER — Other Ambulatory Visit: Payer: Self-pay

## 2019-12-26 ENCOUNTER — Encounter (HOSPITAL_COMMUNITY): Payer: Self-pay | Admitting: General Surgery

## 2019-12-26 ENCOUNTER — Ambulatory Visit (HOSPITAL_COMMUNITY): Payer: Medicare Other

## 2019-12-26 ENCOUNTER — Ambulatory Visit (HOSPITAL_COMMUNITY)
Admission: RE | Admit: 2019-12-26 | Discharge: 2019-12-27 | Disposition: A | Discharge status: Home / Self Care | Payer: Medicare Other | Attending: General Surgery | Admitting: General Surgery

## 2019-12-26 ENCOUNTER — Encounter (HOSPITAL_COMMUNITY)
Admission: RE | Disposition: A | Discharge status: Home / Self Care | Payer: Self-pay | Source: Home / Self Care | Attending: General Surgery

## 2019-12-26 DIAGNOSIS — Z419 Encounter for procedure for purposes other than remedying health state, unspecified: Secondary | ICD-10-CM

## 2019-12-26 DIAGNOSIS — Z171 Estrogen receptor negative status [ER-]: Secondary | ICD-10-CM | POA: Diagnosis not present

## 2019-12-26 DIAGNOSIS — C50912 Malignant neoplasm of unspecified site of left female breast: Secondary | ICD-10-CM | POA: Diagnosis present

## 2019-12-26 DIAGNOSIS — M797 Fibromyalgia: Secondary | ICD-10-CM | POA: Diagnosis not present

## 2019-12-26 DIAGNOSIS — Z95828 Presence of other vascular implants and grafts: Secondary | ICD-10-CM

## 2019-12-26 DIAGNOSIS — C773 Secondary and unspecified malignant neoplasm of axilla and upper limb lymph nodes: Secondary | ICD-10-CM | POA: Insufficient documentation

## 2019-12-26 DIAGNOSIS — C50412 Malignant neoplasm of upper-outer quadrant of left female breast: Secondary | ICD-10-CM | POA: Diagnosis present

## 2019-12-26 HISTORY — PX: PORTACATH PLACEMENT: SHX2246

## 2019-12-26 HISTORY — PX: MASTECTOMY MODIFIED RADICAL: SHX5962

## 2019-12-26 SURGERY — MASTECTOMY, MODIFIED RADICAL
Anesthesia: Regional | Site: Chest | Laterality: Right

## 2019-12-26 MED ORDER — CELECOXIB 200 MG PO CAPS: 200.0000 mg | ORAL_CAPSULE | ORAL | Status: AC

## 2019-12-26 MED ORDER — EPHEDRINE 5 MG/ML INJ
INTRAVENOUS | Status: AC
  Filled 2019-12-26: qty 10

## 2019-12-26 MED ORDER — PHENYLEPHRINE 40 MCG/ML (10ML) SYRINGE FOR IV PUSH (FOR BLOOD PRESSURE SUPPORT)
PREFILLED_SYRINGE | INTRAVENOUS | Status: DC | PRN
  Administered 2019-12-26: 80 ug via INTRAVENOUS
  Administered 2019-12-26: 120 ug via INTRAVENOUS
  Administered 2019-12-26 (×2): 80 ug via INTRAVENOUS

## 2019-12-26 MED ORDER — PROPOFOL 10 MG/ML IV BOLUS
INTRAVENOUS | Status: DC | PRN
  Administered 2019-12-26: 160 mg via INTRAVENOUS

## 2019-12-26 MED ORDER — CHLORHEXIDINE GLUCONATE CLOTH 2 % EX PADS: 6.0000 | MEDICATED_PAD | Freq: Once | CUTANEOUS | Status: DC

## 2019-12-26 MED ORDER — PANTOPRAZOLE SODIUM 40 MG IV SOLR
40.0000 mg | Freq: Every day | INTRAVENOUS | Status: DC
  Administered 2019-12-26: 40 mg via INTRAVENOUS
  Filled 2019-12-26: qty 40

## 2019-12-26 MED ORDER — PHENYLEPHRINE 40 MCG/ML (10ML) SYRINGE FOR IV PUSH (FOR BLOOD PRESSURE SUPPORT)
PREFILLED_SYRINGE | INTRAVENOUS | Status: AC
  Filled 2019-12-26: qty 10

## 2019-12-26 MED ORDER — DEXAMETHASONE SODIUM PHOSPHATE 10 MG/ML IJ SOLN
INTRAMUSCULAR | Status: AC
  Filled 2019-12-26: qty 1

## 2019-12-26 MED ORDER — BUPIVACAINE-EPINEPHRINE (PF) 0.5% -1:200000 IJ SOLN
INTRAMUSCULAR | Status: DC | PRN
  Administered 2019-12-26: 20 mL via PERINEURAL

## 2019-12-26 MED ORDER — MEPERIDINE HCL 25 MG/ML IJ SOLN: 6.2500 mg | INTRAMUSCULAR | Status: DC | PRN

## 2019-12-26 MED ORDER — LIDOCAINE 2% (20 MG/ML) 5 ML SYRINGE
INTRAMUSCULAR | Status: DC | PRN
  Administered 2019-12-26: 80 mg via INTRAVENOUS

## 2019-12-26 MED ORDER — BUPIVACAINE LIPOSOME 1.3 % IJ SUSP
INTRAMUSCULAR | Status: DC | PRN
  Administered 2019-12-26: 10 mL via PERINEURAL

## 2019-12-26 MED ORDER — METHOCARBAMOL 750 MG PO TABS: 750.0000 mg | ORAL_TABLET | Freq: Four times a day (QID) | ORAL | 2 refills | Status: AC | PRN

## 2019-12-26 MED ORDER — SODIUM CHLORIDE 0.9 % IV SOLN: INTRAVENOUS | Status: DC | PRN

## 2019-12-26 MED ORDER — ONDANSETRON HCL 4 MG/2ML IJ SOLN
INTRAMUSCULAR | Status: DC | PRN
  Administered 2019-12-26: 4 mg via INTRAVENOUS

## 2019-12-26 MED ORDER — HEPARIN SODIUM (PORCINE) 5000 UNIT/ML IJ SOLN
5000.0000 [IU] | Freq: Three times a day (TID) | INTRAMUSCULAR | Status: DC
  Administered 2019-12-27: 5000 [IU] via SUBCUTANEOUS
  Filled 2019-12-26: qty 1

## 2019-12-26 MED ORDER — 0.9 % SODIUM CHLORIDE (POUR BTL) OPTIME
TOPICAL | Status: DC | PRN
  Administered 2019-12-26: 1000 mL

## 2019-12-26 MED ORDER — PROPOFOL 10 MG/ML IV BOLUS
INTRAVENOUS | Status: AC
  Filled 2019-12-26: qty 20

## 2019-12-26 MED ORDER — OXYCODONE HCL 5 MG/5ML PO SOLN: 5.0000 mg | Freq: Once | ORAL | Status: AC | PRN

## 2019-12-26 MED ORDER — OXYCODONE HCL 5 MG PO TABS
5.0000 mg | ORAL_TABLET | Freq: Four times a day (QID) | ORAL | 0 refills | Status: DC | PRN
Start: 2019-12-26 — End: 2020-03-31

## 2019-12-26 MED ORDER — ONDANSETRON HCL 4 MG/2ML IJ SOLN: 4.0000 mg | Freq: Once | INTRAMUSCULAR | Status: DC | PRN

## 2019-12-26 MED ORDER — ORAL CARE MOUTH RINSE: 15.0000 mL | Freq: Once | OROMUCOSAL | Status: AC

## 2019-12-26 MED ORDER — KCL IN DEXTROSE-NACL 20-5-0.9 MEQ/L-%-% IV SOLN
INTRAVENOUS | Status: DC
  Filled 2019-12-26: qty 1000

## 2019-12-26 MED ORDER — MORPHINE SULFATE (PF) 2 MG/ML IV SOLN: 1.0000 mg | INTRAVENOUS | Status: DC | PRN

## 2019-12-26 MED ORDER — HEPARIN SOD (PORK) LOCK FLUSH 100 UNIT/ML IV SOLN
INTRAVENOUS | Status: DC | PRN
  Administered 2019-12-26: 500 [IU] via INTRAVENOUS

## 2019-12-26 MED ORDER — VANCOMYCIN HCL IN DEXTROSE 1-5 GM/200ML-% IV SOLN
INTRAVENOUS | Status: AC
  Administered 2019-12-26: 1000 mg via INTRAVENOUS
  Filled 2019-12-26: qty 200

## 2019-12-26 MED ORDER — FENTANYL CITRATE (PF) 100 MCG/2ML IJ SOLN
INTRAMUSCULAR | Status: AC
  Administered 2019-12-26: 100 ug via INTRAVENOUS
  Filled 2019-12-26: qty 2

## 2019-12-26 MED ORDER — VANCOMYCIN HCL IN DEXTROSE 1-5 GM/200ML-% IV SOLN: 1000.0000 mg | INTRAVENOUS | Status: AC

## 2019-12-26 MED ORDER — EPHEDRINE SULFATE-NACL 50-0.9 MG/10ML-% IV SOSY
PREFILLED_SYRINGE | INTRAVENOUS | Status: DC | PRN
  Administered 2019-12-26 (×2): 10 mg via INTRAVENOUS

## 2019-12-26 MED ORDER — HEPARIN SOD (PORK) LOCK FLUSH 100 UNIT/ML IV SOLN
INTRAVENOUS | Status: AC
  Filled 2019-12-26: qty 5

## 2019-12-26 MED ORDER — OXYCODONE HCL 5 MG PO TABS
5.0000 mg | ORAL_TABLET | ORAL | Status: DC | PRN
  Administered 2019-12-26 – 2019-12-27 (×4): 5 mg via ORAL
  Filled 2019-12-26 (×3): qty 1

## 2019-12-26 MED ORDER — BUPIVACAINE HCL (PF) 0.25 % IJ SOLN
INTRAMUSCULAR | Status: AC
  Filled 2019-12-26: qty 30

## 2019-12-26 MED ORDER — ACETAMINOPHEN 500 MG PO TABS
ORAL_TABLET | ORAL | Status: AC
  Administered 2019-12-26: 1000 mg via ORAL
  Filled 2019-12-26: qty 2

## 2019-12-26 MED ORDER — OXYCODONE HCL 5 MG PO TABS: 5.0000 mg | ORAL_TABLET | Freq: Four times a day (QID) | ORAL | 0 refills | Status: DC | PRN

## 2019-12-26 MED ORDER — ONDANSETRON 4 MG PO TBDP: 4.0000 mg | ORAL_TABLET | Freq: Four times a day (QID) | ORAL | Status: DC | PRN

## 2019-12-26 MED ORDER — CELECOXIB 200 MG PO CAPS
ORAL_CAPSULE | ORAL | Status: AC
  Administered 2019-12-26: 200 mg via ORAL
  Filled 2019-12-26: qty 1

## 2019-12-26 MED ORDER — CHLORHEXIDINE GLUCONATE 0.12 % MT SOLN
OROMUCOSAL | Status: AC
  Administered 2019-12-26: 15 mL via OROMUCOSAL
  Filled 2019-12-26: qty 15

## 2019-12-26 MED ORDER — ONDANSETRON HCL 4 MG/2ML IJ SOLN: 4.0000 mg | Freq: Four times a day (QID) | INTRAMUSCULAR | Status: DC | PRN

## 2019-12-26 MED ORDER — GABAPENTIN 300 MG PO CAPS
ORAL_CAPSULE | ORAL | Status: AC
  Administered 2019-12-26: 300 mg via ORAL
  Filled 2019-12-26: qty 1

## 2019-12-26 MED ORDER — BUPIVACAINE HCL (PF) 0.25 % IJ SOLN
INTRAMUSCULAR | Status: DC | PRN
  Administered 2019-12-26: 8 mL

## 2019-12-26 MED ORDER — FENTANYL CITRATE (PF) 250 MCG/5ML IJ SOLN
INTRAMUSCULAR | Status: AC
  Filled 2019-12-26: qty 5

## 2019-12-26 MED ORDER — CHLORHEXIDINE GLUCONATE 0.12 % MT SOLN: 15.0000 mL | Freq: Once | OROMUCOSAL | Status: AC

## 2019-12-26 MED ORDER — MIDAZOLAM HCL 2 MG/2ML IJ SOLN
INTRAMUSCULAR | Status: AC
  Administered 2019-12-26: 2 mg via INTRAVENOUS
  Filled 2019-12-26: qty 2

## 2019-12-26 MED ORDER — LACTATED RINGERS IV SOLN: INTRAVENOUS | Status: DC

## 2019-12-26 MED ORDER — ONDANSETRON HCL 4 MG/2ML IJ SOLN
INTRAMUSCULAR | Status: AC
  Filled 2019-12-26: qty 2

## 2019-12-26 MED ORDER — MIDAZOLAM HCL 2 MG/2ML IJ SOLN: 2.0000 mg | Freq: Once | INTRAMUSCULAR | Status: AC

## 2019-12-26 MED ORDER — SODIUM CHLORIDE 0.9 % IV SOLN
INTRAVENOUS | Status: AC
  Filled 2019-12-26: qty 1.2

## 2019-12-26 MED ORDER — ACETAMINOPHEN 500 MG PO TABS: 1000.0000 mg | ORAL_TABLET | ORAL | Status: AC

## 2019-12-26 MED ORDER — FENTANYL CITRATE (PF) 100 MCG/2ML IJ SOLN: 100.0000 ug | Freq: Once | INTRAMUSCULAR | Status: AC

## 2019-12-26 MED ORDER — OXYCODONE HCL 5 MG PO TABS
ORAL_TABLET | ORAL | Status: AC
Start: 2019-12-26 — End: 2019-12-27
  Filled 2019-12-26: qty 1

## 2019-12-26 MED ORDER — DEXAMETHASONE SODIUM PHOSPHATE 10 MG/ML IJ SOLN
INTRAMUSCULAR | Status: DC | PRN
  Administered 2019-12-26: 5 mg via INTRAVENOUS

## 2019-12-26 MED ORDER — OXYCODONE HCL 5 MG PO TABS
5.0000 mg | ORAL_TABLET | Freq: Once | ORAL | Status: AC | PRN
  Administered 2019-12-26: 5 mg via ORAL

## 2019-12-26 MED ORDER — GABAPENTIN 300 MG PO CAPS: 300.0000 mg | ORAL_CAPSULE | ORAL | Status: AC

## 2019-12-26 MED ORDER — LIDOCAINE 2% (20 MG/ML) 5 ML SYRINGE
INTRAMUSCULAR | Status: AC
  Filled 2019-12-26: qty 5

## 2019-12-26 MED ORDER — METHOCARBAMOL 500 MG PO TABS: 500.0000 mg | ORAL_TABLET | Freq: Four times a day (QID) | ORAL | Status: DC | PRN

## 2019-12-26 MED ORDER — FENTANYL CITRATE (PF) 100 MCG/2ML IJ SOLN
INTRAMUSCULAR | Status: DC | PRN
  Administered 2019-12-26: 150 ug via INTRAVENOUS

## 2019-12-26 MED ORDER — HEMOSTATIC AGENTS (NO CHARGE) OPTIME
TOPICAL | Status: DC | PRN
  Administered 2019-12-26: 1 via TOPICAL

## 2019-12-26 SURGICAL SUPPLY — 65 items
ADH SKN CLS APL DERMABOND .7 (GAUZE/BANDAGES/DRESSINGS) ×2
APL PRP STRL LF DISP 70% ISPRP (MISCELLANEOUS) ×2
APPLIER CLIP 9.375 MED OPEN (MISCELLANEOUS) ×12
APR CLP MED 9.3 20 MLT OPN (MISCELLANEOUS) ×6
BAG DECANTER FOR FLEXI CONT (MISCELLANEOUS) ×4 IMPLANT
BINDER BREAST XLRG (GAUZE/BANDAGES/DRESSINGS) ×2 IMPLANT
BIOPATCH RED 1 DISK 7.0 (GAUZE/BANDAGES/DRESSINGS) ×6 IMPLANT
BIOPATCH RED 1IN DISK 7.0MM (GAUZE/BANDAGES/DRESSINGS) ×2
CANISTER SUCT 3000ML PPV (MISCELLANEOUS) ×4 IMPLANT
CHLORAPREP W/TINT 10.5 ML (MISCELLANEOUS) ×4 IMPLANT
CHLORAPREP W/TINT 26 (MISCELLANEOUS) ×4 IMPLANT
CLIP APPLIE 9.375 MED OPEN (MISCELLANEOUS) ×2 IMPLANT
COVER SURGICAL LIGHT HANDLE (MISCELLANEOUS) ×4 IMPLANT
COVER TRANSDUCER ULTRASND GEL (DISPOSABLE) ×2 IMPLANT
COVER WAND RF STERILE (DRAPES) ×2 IMPLANT
DECANTER SPIKE VIAL GLASS SM (MISCELLANEOUS) ×4 IMPLANT
DERMABOND ADVANCED (GAUZE/BANDAGES/DRESSINGS) ×2
DERMABOND ADVANCED .7 DNX12 (GAUZE/BANDAGES/DRESSINGS) ×2 IMPLANT
DEVICE DISSECT PLASMABLAD 3.0S (MISCELLANEOUS) IMPLANT
DRAIN CHANNEL 19F RND (DRAIN) ×8 IMPLANT
DRAPE C-ARM 42X120 X-RAY (DRAPES) ×2 IMPLANT
DRAPE C-ARM 42X72 X-RAY (DRAPES) ×2 IMPLANT
DRAPE CHEST BREAST 15X10 FENES (DRAPES) ×4 IMPLANT
DRSG PAD ABDOMINAL 8X10 ST (GAUZE/BANDAGES/DRESSINGS) ×4 IMPLANT
DRSG TEGADERM 4X4.75 (GAUZE/BANDAGES/DRESSINGS) ×6 IMPLANT
ELECT CAUTERY BLADE 6.4 (BLADE) ×4 IMPLANT
ELECT REM PT RETURN 9FT ADLT (ELECTROSURGICAL) ×4
ELECTRODE REM PT RTRN 9FT ADLT (ELECTROSURGICAL) ×2 IMPLANT
EVACUATOR SILICONE 100CC (DRAIN) ×8 IMPLANT
FILTER IN LINE W/DETACHED HOSE (FILTER) ×4 IMPLANT
GAUZE SPONGE 4X4 12PLY STRL (GAUZE/BANDAGES/DRESSINGS) ×4 IMPLANT
GEL ULTRASOUND 20GR AQUASONIC (MISCELLANEOUS) IMPLANT
GLOVE BIO SURGEON STRL SZ7.5 (GLOVE) ×4 IMPLANT
GOWN STRL REUS W/ TWL LRG LVL3 (GOWN DISPOSABLE) ×4 IMPLANT
GOWN STRL REUS W/TWL LRG LVL3 (GOWN DISPOSABLE) ×8
HEMOSTAT SURGICEL 2X14 (HEMOSTASIS) ×2 IMPLANT
KIT BASIN OR (CUSTOM PROCEDURE TRAY) ×4 IMPLANT
KIT PORT POWER 8FR ISP CVUE (Port) ×2 IMPLANT
KIT TURNOVER KIT B (KITS) ×4 IMPLANT
LIGHT WAVEGUIDE WIDE FLAT (MISCELLANEOUS) IMPLANT
NEEDLE 22X1 1/2 (OR ONLY) (NEEDLE) IMPLANT
NS IRRIG 1000ML POUR BTL (IV SOLUTION) ×4 IMPLANT
PACK GENERAL/GYN (CUSTOM PROCEDURE TRAY) ×6 IMPLANT
PAD ARMBOARD 7.5X6 YLW CONV (MISCELLANEOUS) ×8 IMPLANT
PENCIL BUTTON HOLSTER BLD 10FT (ELECTRODE) ×4 IMPLANT
PENCIL SMOKE EVACUATOR (MISCELLANEOUS) ×4 IMPLANT
PLASMABLADE 3.0S (MISCELLANEOUS) ×4
POSITIONER HEAD DONUT 9IN (MISCELLANEOUS) ×4 IMPLANT
SHEATH COOK PEEL AWAY SET 9F (SHEATH) IMPLANT
SPECIMEN JAR X LARGE (MISCELLANEOUS) ×2 IMPLANT
SUT ETHILON 3 0 FSL (SUTURE) ×8 IMPLANT
SUT MNCRL AB 4-0 PS2 18 (SUTURE) ×4 IMPLANT
SUT MON AB 4-0 PC3 18 (SUTURE) ×2 IMPLANT
SUT PROLENE 2 0 SH 30 (SUTURE) ×4 IMPLANT
SUT VIC AB 3-0 SH 18 (SUTURE) ×8 IMPLANT
SUT VIC AB 3-0 SH 27 (SUTURE) ×4
SUT VIC AB 3-0 SH 27XBRD (SUTURE) ×2 IMPLANT
SYR 10ML LL (SYRINGE) IMPLANT
SYR 20ML LL LF (SYRINGE) ×4 IMPLANT
SYR 5ML LUER SLIP (SYRINGE) ×4 IMPLANT
TOWEL GREEN STERILE (TOWEL DISPOSABLE) ×4 IMPLANT
TOWEL GREEN STERILE FF (TOWEL DISPOSABLE) ×4 IMPLANT
TRAY LAPAROSCOPIC MC (CUSTOM PROCEDURE TRAY) ×2 IMPLANT
TUBE CONNECTING 12'X1/4 (SUCTIONS)
TUBE CONNECTING 12X1/4 (SUCTIONS) IMPLANT

## 2019-12-26 NOTE — Anesthesia Procedure Notes (Signed)
Procedure Name: LMA Insertion Date/Time: 12/26/2019 1:25 PM Performed by: Trinna Post., CRNA Pre-anesthesia Checklist: Patient identified, Emergency Drugs available, Suction available, Patient being monitored and Timeout performed Patient Re-evaluated:Patient Re-evaluated prior to induction Oxygen Delivery Method: Circle system utilized Preoxygenation: Pre-oxygenation with 100% oxygen Induction Type: IV induction Ventilation: Mask ventilation without difficulty LMA: LMA inserted LMA Size: 4.0 Number of attempts: 1 Placement Confirmation: positive ETCO2 and breath sounds checked- equal and bilateral Tube secured with: Tape Dental Injury: Teeth and Oropharynx as per pre-operative assessment

## 2019-12-26 NOTE — Anesthesia Preprocedure Evaluation (Signed)
Anesthesia Evaluation  Patient identified by MRN, date of birth, ID band Patient awake    Reviewed: Allergy & Precautions, NPO status , Patient's Chart, lab work & pertinent test results, reviewed documented beta blocker date and time   Airway Mallampati: II  TM Distance: >3 FB Neck ROM: Full    Dental  (+) Teeth Intact, Poor Dentition, Caps, Missing   Pulmonary neg pulmonary ROS,    Pulmonary exam normal breath sounds clear to auscultation       Cardiovascular negative cardio ROS Normal cardiovascular exam Rhythm:Regular Rate:Normal     Neuro/Psych  Neuromuscular disease negative psych ROS   GI/Hepatic negative GI ROS, (+) Hepatitis -, C  Endo/Other  Left breast Ca  Renal/GU negative Renal ROS  negative genitourinary   Musculoskeletal  (+) Fibromyalgia -  Abdominal (+) + obese,   Peds  Hematology negative hematology ROS (+)   Anesthesia Other Findings   Reproductive/Obstetrics                             Anesthesia Physical Anesthesia Plan  ASA: II  Anesthesia Plan: General   Post-op Pain Management:  Regional for Post-op pain   Induction: Intravenous  PONV Risk Score and Plan: 4 or greater and Midazolam, Ondansetron, Dexamethasone and Treatment may vary due to age or medical condition  Airway Management Planned: LMA  Additional Equipment:   Intra-op Plan:   Post-operative Plan: Extubation in OR  Informed Consent: I have reviewed the patients History and Physical, chart, labs and discussed the procedure including the risks, benefits and alternatives for the proposed anesthesia with the patient or authorized representative who has indicated his/her understanding and acceptance.     Dental advisory given  Plan Discussed with: CRNA and Anesthesiologist  Anesthesia Plan Comments:         Anesthesia Quick Evaluation

## 2019-12-26 NOTE — Interval H&P Note (Signed)
History and Physical Interval Note:  12/26/2019 11:58 AM  Lindsay [name]  has presented today for surgery, with the diagnosis of LEFT BREAST CANCER.  The various methods of treatment have been discussed with the patient and family. After consideration of risks, benefits and other options for treatment, the patient has consented to  Procedure(s) with comments: LEFT MASTECTOMY MODIFIED RADICAL (Left) - PEC BLOCK INSERTION PORT-A-CATH WITH ULTRASOUND GUIDANCE (N/A) as a surgical intervention.  The patient's history has been reviewed, patient examined, no change in status, stable for surgery.  I have reviewed the patient's chart and labs.  Questions were answered to the patient's satisfaction.     Autumn Messing III

## 2019-12-26 NOTE — Op Note (Signed)
12/26/2019  3:35 PM  PATIENT:  Lindsay [name]  64 y.o. female  PRE-OPERATIVE DIAGNOSIS:  LEFT BREAST CANCER  POST-OPERATIVE DIAGNOSIS:  LEFT BREAST CANCER  PROCEDURE:  Procedure(s) with comments: LEFT MASTECTOMY MODIFIED RADICAL (Left) - PEC BLOCK INSERTION PORT-A-CATH (right subclavian vein)  SURGEON:  Surgeon(s) and Role:    * [name], MD - Primary  PHYSICIAN ASSISTANT:   ASSISTANTS: Judyann Munson, RNFA   ANESTHESIA:   local and general  EBL:  100 mL   BLOOD ADMINISTERED:none  DRAINS: (2) Jackson-Pratt drain(s) with closed bulb suction in the prepectoral space   LOCAL MEDICATIONS USED:  MARCAINE     SPECIMEN:  Source of Specimen:  left mastectomy and axillary contents  DISPOSITION OF SPECIMEN:  PATHOLOGY  COUNTS:  YES  TOURNIQUET:  * No tourniquets in log *  DICTATION: .Dragon Dictation   After informed consent was obtained the patient was brought to the operating room and placed in the supine position on the operating table.  After adequate induction of general anesthesia a roll was placed between the patient's shoulder blades to extend the shoulder slightly.  The bilateral chest, neck, and breast and axilla were prepped with ChloraPrep, allowed to dry, and draped in usual sterile manner.  Attention was first turned to the right chest wall.  The patient was placed in Trendelenburg position.  The area lateral to the bend of the clavicle on the right chest was infiltrated with quarter percent Marcaine.  A large bore needle from the Port-A-Cath kit was used to slide beneath the bend of the clavicle heading towards the sternal notch and in doing so I was able to access the right subclavian vein without difficulty.  A wire was fed through the needle using the Seldinger technique without difficulty.  The wire was confirmed in the central venous system using real-time fluoroscopy.  Next a small incision was made at the wire entry site with a 15 blade knife.  The  incision was carried through the skin and subcutaneous tissue sharply with electrocautery.  A subcutaneous pocket was then created by blunt finger dissection inferior to the incision.  Next the tubing was placed on the reservoir.  The reservoir was placed in the pocket and the length of the tubing was estimated using real-time fluoroscopy.  The tubing was cut to the appropriate length.  Next a sheath and dilator were fed over the wire using the Seldinger technique without difficulty.  The dilator and wire were removed from the patient.  The tubing was fed through the sheath as far as it would go and then held in place while the sheath was gently cracked and separated.  Another real-time fluoroscopy image showed the tip of the catheter to be in the distal superior vena cava.  The tubing was then permanently anchored to the reservoir.  The reservoir was anchored to the pocket with two 2-0 Prolene stitches.  The port was then aspirated and it aspirated blood easily.  The port was then flushed initially with a dilute heparin solution and then with a more concentrated heparin solution.  The subcutaneous tissue was then closed over the port with interrupted 3-0 Vicryl stitches.  The skin was closed with a running 4-0 Monocryl subcuticular stitch.  Attention was then turned to the left breast.  The patient had a large palpable mass in the lateral portion in the lower portion of the left breast.  She had multiple positive lymph nodes.  An elliptical incision was made around  the nipple and areolar complex to incorporate the areas of cancer that were close to the skin.  The incision was carried through the skin and subcutaneous tissue sharply with the PlasmaBlade.  Breast hooks were used to elevate the skin flaps anteriorly towards the ceiling.  Then skin flaps were then created circumferentially by dissection between the breast tissue and the subcutaneous fat.  This dissection was carried all the way to the chest wall.  Next  the breast was removed from the pectoralis muscle with the pectoralis fascia.  Laterally the dissection was carried all the way to the latissimus muscle.  Dissection was then carried superiorly in the axilla.  I was able to identify the latissimus muscle laterally, the serratus muscle medially, and was able to identify the axillary vein superiorly.  There were hard lymph nodes that were obviously invading the axillary vein that we could not remove.  We divided these lymph nodes just inferior to the axillary vein.  The rest of the axillary contents within these boundaries was dissected out by blunt right angle dissection.  Several small vessels then lymphatics were controlled with clips.  The entire contents of the axilla that was safe to remove was removed.  This was then all sent as 1 specimen en bloc with the breast and the axillary contents to pathology for further evaluation.  Hemostasis was achieved using the PlasmaBlade.  The wound was irrigated with copious amounts of saline.  2 small stab incisions were made near the anterior axillary line inferior to the operative bed.  A hemostat was used to bring a 19 [place] round Blake drain through each of these incisions.  The lateral drain was placed in the axilla and the medial drain was called along the chest wall.  The drains were anchored to the skin with 3-0 nylon stitches.  Next the superior and inferior skin flaps were grossly reapproximated with interrupted 3-0 Vicryl stitches.  The skin was then closed with a running 4 Monocryl subcuticular stitch.  Dermabond dressings and other sterile dressings were then applied.  The patient tolerated the procedure well.  At the end of the case all needle sponge and instrument counts were correct.  The patient was then awakened and taken to recovery in stable condition.  PLAN OF CARE: Admit for overnight observation  PATIENT DISPOSITION:  PACU - hemodynamically stable.   Delay start of Pharmacological VTE agent  (>24hrs) due to surgical blood loss or risk of bleeding: no

## 2019-12-26 NOTE — Anesthesia Postprocedure Evaluation (Signed)
Anesthesia Post Note  Patient: Lindsay [name] [facility]  Procedure(s) Performed: LEFT MASTECTOMY MODIFIED RADICAL (Left Breast) INSERTION PORT-A-CATH WITH ULTRASOUND GUIDANCE (Right Chest)     Patient location during evaluation: PACU Anesthesia Type: General Level of consciousness: awake and alert and oriented Pain management: pain level controlled Vital Signs Assessment: post-procedure vital signs reviewed and stable Respiratory status: spontaneous breathing, nonlabored ventilation and respiratory function stable Cardiovascular status: blood pressure returned to baseline and stable Postop Assessment: no apparent nausea or vomiting Anesthetic complications: no   No complications documented.  Last Vitals:  Vitals:   12/26/19 1130 12/26/19 1551  BP: (!) 142/74   Pulse: 68   Resp: 18 (P) 18  Temp: 36.6 C   SpO2: 100% (P) 100%    Last Pain:  Vitals:   12/26/19 1551  TempSrc:   PainSc: (P) 0-No pain                 Joreen Swearingin A.

## 2019-12-26 NOTE — Transfer of Care (Signed)
Immediate Anesthesia Transfer of Care Note  Patient: Lindsay [name] [facility]  Procedure(s) Performed: LEFT MASTECTOMY MODIFIED RADICAL (Left Breast) INSERTION PORT-A-CATH WITH ULTRASOUND GUIDANCE (Right Chest)  Patient Location: PACU  Anesthesia Type:General and Regional  Level of Consciousness: awake, alert  and oriented  Airway & Oxygen Therapy: Patient Spontanous Breathing and Patient connected to nasal cannula oxygen  Post-op Assessment: Report given to RN and Post -op Vital signs reviewed and stable  Post vital signs: Reviewed and stable  Last Vitals:  Vitals Value Taken Time  BP 133/74 12/26/19 1550  Temp    Pulse 91 12/26/19 1551  Resp    SpO2 97 % 12/26/19 1551  Vitals shown include unvalidated device data.  Last Pain:  Vitals:   12/26/19 1130  TempSrc: Oral  PainSc: 0-No pain      Patients Stated Pain Goal: 3 (03/55/97 4163)  Complications: No complications documented.

## 2019-12-26 NOTE — Anesthesia Procedure Notes (Signed)
Anesthesia Regional Block: Pectoralis block   Pre-Anesthetic Checklist: ,, timeout performed, Correct Patient, Correct Site, Correct Laterality, Correct Procedure, Correct Position, site marked, Risks and benefits discussed,  Surgical consent,  Pre-op evaluation,  At surgeon's request and post-op pain management  Laterality: Left  Prep: Dura Prep       Needles:  Injection technique: Single-shot  Needle Type: Echogenic Stimulator Needle     Needle Length: 9cm  Needle Gauge: 21   Needle insertion depth: 8 cm   Additional Needles:   Procedures:,,,, ultrasound used (permanent image in chart),,,,  Narrative:  Start time: 12/26/2019 1:02 PM End time: 12/26/2019 1:07 PM Injection made incrementally with aspirations every 5 mL.  Performed by: Personally  Anesthesiologist: Josephine Igo, MD  Additional Notes: Timeout performed. Patient sedated. Relevant anatomy ID'd using [place]. Incremental 2-27ml injection of LA with frequent aspiration. Patient tolerated procedure well.        Left Pectoralis Block

## 2019-12-26 NOTE — H&P (Signed)
Lindsay [name]  Location: [facility] Surgery Patient #: [id number] DOB: [birth year] Undefined / Language: Cleophus Molt / Race: White Female   History of Present Illness  The patient is a 64 year old female who presents with breast cancer.We are asked to see the patient in consultation by Dr. Burr Medico to evaluate her for a new left breast cancer. The patient is a 64 year old white female who presents with a palpable mass in the lateral left breast since November. She has not had a mammogram in about 20 years. She does not smoke. The mass measured about 4cm and there were actually 3 masses present. the other two measured about a cm. The third area has not been biopsied yet. There were 2 abnormal lymph nodes one of which was biopied and positive. 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   Past Surgical History  Breast Biopsy  Left.  Diagnostic Studies History  Colonoscopy  never Mammogram  within last year Pap Smear  1-5 years ago  Medication History  Medications Reconciled  Social History  Alcohol use  Remotely quit alcohol use. Caffeine use  Carbonated beverages. Illicit drug use  Remotely quit drug use. Tobacco use  Never smoker.  Family History  Alcohol Abuse  Family Members In General. Arthritis  Family Members In General. Breast Cancer  Family Members In General. Depression  Brother. Diabetes Mellitus  Family Members In General. Heart Disease  Family Members In General. Hypertension  Father, Mother. Ovarian Cancer  Family Members In General.  Pregnancy / Birth History  Age at menarche  35 years. Age of menopause  [age] Contraceptive History  Oral contraceptives. Gravida  0 Irregular periods  Para  0  Other Problems  Alcohol Abuse  Arthritis  Back Pain  Depression  Hepatitis  Lump In Breast  Seizure Disorder     Review of Systems  General Present- Fatigue, Night Sweats and Weight Gain. Not  Present- Appetite Loss, Chills, Fever and Weight Loss. Skin Present- Change in Wart/Mole, Dryness and Rash. Not Present- Hives, Jaundice, New Lesions, Non-Healing Wounds and Ulcer. HEENT Present- Wears glasses/contact lenses. Not Present- Earache, Hearing Loss, Hoarseness, Nose Bleed, Oral Ulcers, Ringing in the Ears, Seasonal Allergies, Sinus Pain, Sore Throat, Visual Disturbances and Yellow Eyes. Respiratory Present- Chronic Cough. Not Present- Bloody sputum, Difficulty Breathing, Snoring and Wheezing. Breast Present- Breast Mass and Breast Pain. Not Present- Nipple Discharge and Skin Changes. Cardiovascular Present- Leg Cramps and Swelling of Extremities. Not Present- Chest Pain, Difficulty Breathing Lying Down, Palpitations, Rapid Heart Rate and Shortness of Breath. Gastrointestinal Not Present- Abdominal Pain, Bloating, Bloody Stool, Change in Bowel Habits, Chronic diarrhea, Constipation, Difficulty Swallowing, Excessive gas, Gets full quickly at meals, Hemorrhoids, Indigestion, Nausea, Rectal Pain and Vomiting. Female Genitourinary Present- Urgency. Not Present- Frequency, Nocturia, Painful Urination and Pelvic Pain. Musculoskeletal Present- Back Pain and Joint Pain. Not Present- Joint Stiffness, Muscle Pain, Muscle Weakness and Swelling of Extremities. Neurological Present- Decreased Memory and Headaches. Not Present- Fainting, Numbness, Seizures, Tingling, Tremor, Trouble walking and Weakness. Psychiatric Present- Depression. Not Present- Anxiety, Bipolar, Change in Sleep Pattern, Fearful and Frequent crying. Endocrine Not Present- Cold Intolerance, Excessive Hunger, Hair Changes, Heat Intolerance, Hot flashes and New Diabetes. Hematology Not Present- Blood Thinners, Easy Bruising, Excessive bleeding, Gland problems, HIV and Persistent Infections.   Physical Exam  General Mental Status-Alert. General Appearance-Consistent with stated age. Hydration-Well  hydrated. Voice-Normal.  Head and Neck Head-normocephalic, atraumatic with no lesions or palpable  masses. Trachea-midline. Thyroid Gland Characteristics - normal size and consistency.  Eye Eyeball - Bilateral-Extraocular movements intact. Sclera/Conjunctiva - Bilateral-No scleral icterus.  Chest and Lung Exam Chest and lung exam reveals -quiet, even and easy respiratory effort with no use of accessory muscles and on auscultation, normal breath sounds, no adventitious sounds and normal vocal resonance. Inspection Chest Wall - Normal. Back - normal.  Breast Note: there is a large palpable mass in the central and lateral left breast with overlying skin thickening. There is no palpable mass in the right breast. there is no palpable axillary, supraclavicular, or cervical lymphadenopathy   Cardiovascular Cardiovascular examination reveals -normal heart sounds, regular rate and rhythm with no murmurs and normal pedal pulses bilaterally.  Abdomen Inspection Inspection of the abdomen reveals - No Hernias. Skin - Scar - no surgical scars. Palpation/Percussion Palpation and Percussion of the abdomen reveal - Soft, Non Tender, No Rebound tenderness, No Rigidity (guarding) and No hepatosplenomegaly. Auscultation Auscultation of the abdomen reveals - Bowel sounds normal.  Neurologic Neurologic evaluation reveals -alert and oriented x 3 with no impairment of recent or remote memory. Mental Status-Normal.  Musculoskeletal Normal Exam - Left-Upper Extremity Strength Normal and Lower Extremity Strength Normal. Normal Exam - Right-Upper Extremity Strength Normal and Lower Extremity Strength Normal.  Lymphatic Head & Neck  General Head & Neck Lymphatics: Bilateral - Description - Normal. Axillary  General Axillary Region: Bilateral - Description - Normal. Tenderness - Non Tender. Femoral & Inguinal  Generalized Femoral & Inguinal Lymphatics: Bilateral -  Description - Normal. Tenderness - Non Tender.    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 Because of this she may not respond well to neoadjuvant chemo. At this point I would recommend modified radical mastectomy and port for adjuvant chemo. I have discussed with her in detail the risks and benefits of the surgery as well as some of the technical aspects and she understands and wishes to proceed. She will see med and rad onc for adjuvant therapy. This patient encounter took 60 minutes today to perform the following: take history, perform exam, review outside records, interpret imaging, counsel the patient on their diagnosis and document encounter, findings & plan in the EHR Current Plans Referred to Oncology, for evaluation and follow up (Oncology). Routine.

## 2019-12-27 DIAGNOSIS — C50412 Malignant neoplasm of upper-outer quadrant of left female breast: Secondary | ICD-10-CM | POA: Diagnosis not present

## 2019-12-27 NOTE — Discharge Summary (Signed)
Physician Discharge Summary  Patient ID:  Lindsay [name]  MRN: [id number]  DOB/AGE: 07/10/1955 64 y.o.  Admit date: 12/26/2019 Discharge date: 12/27/2019  Discharge Diagnoses:   Active Problems:   Cancer of left female breast  [facility])  Operation: Procedure(s):  LEFT MASTECTOMY MODIFIED RADICAL, INSERTION PORT-A-CATH WITH ULTRASOUND GUIDANCE on 12/26/2019 Marlou Starks  Discharged Condition: good  Hospital Course: Lindsay [name] is an 64 y.o. female whose primary care physician is Scotty Court, DO and who was admitted 12/26/2019 with a chief complaint of left breast cancer.   She was brought to the operating room on 12/26/2019 and underwent LEFT MASTECTOMY MODIFIED RADICAL, INSERTION PORT-A-CATH.   She is now one day post op.  She has some tightness in her left axilla, but is otherwise doing well. She is ready for discharge.  She lives by herself, but has lined up several friends to help her.  The discharge instructions were reviewed with the patient.  Consults: None  Significant Diagnostic Studies: Results for orders placed or performed during the hospital encounter of 12/23/19  SARS CORONAVIRUS 2 (TAT 6-24 HRS) Nasopharyngeal Nasopharyngeal Swab   Specimen: Nasopharyngeal Swab  Result Value Ref Range   SARS Coronavirus 2 NEGATIVE NEGATIVE    DG Chest Port 1 View  Result Date: 12/26/2019 CLINICAL DATA:  64 year old female with Port-A-Cath placement. EXAM: PORTABLE CHEST 1 VIEW COMPARISON:  Chest CT dated 11/21/2019. FINDINGS: Right-sided Port-A-Cath with tip over central SVC. The lungs are clear. There is no pleural effusion or pneumothorax. The cardiac silhouette is within limits. No acute osseous pathology. Left chest wall/axillary surgical clips. IMPRESSION: Right-sided Port-A-Cath with tip over central SVC.  No pneumothorax. Electronically Signed   By: Anner Crete M.D.   On: 12/26/2019 16:19   DG Fluoro Guide CV Line-No Report  Result Date: 12/26/2019 Fluoroscopy was  utilized by the requesting physician.  No radiographic interpretation.    Discharge Exam:  Vitals:   12/27/19 0243 12/27/19 0614  BP: 114/76 131/64  Pulse: 73 68  Resp: 16 16  Temp: 98.5 F (36.9 C) 98.2 F (36.8 C)  SpO2: 98% 99%    General: WN WF who is alert and generally healthy appearing.  Lungs: Clear to auscultation and symmetric breath sounds. Chest:  Left mastectomy wound okay.  Right subclavian wound okay.  Left chest drains - 75 cc and 50 cc over the last 24 hours.      Discharge Medications:   Allergies as of 12/27/2019      Reactions   Codeine Hives, Nausea And Vomiting      Medication List    STOP taking these medications   valACYclovir 1000 MG tablet Commonly known as: VALTREX     TAKE these medications   acetaminophen 650 MG CR tablet Commonly known as: TYLENOL Take 650 mg by mouth every 8 (eight) hours as needed for pain.   methocarbamol 750 MG tablet Commonly known as: ROBAXIN Take 1 tablet (750 mg total) by mouth 4 (four) times daily as needed (use for muscle cramps/pain).   oxyCODONE 5 MG immediate release tablet Commonly known as: Oxy IR/ROXICODONE Take 1 tablet (5 mg total) by mouth every 6 (six) hours as needed (for pain score of 1-4).   oxyCODONE 5 MG immediate release tablet Commonly known as: Oxy IR/ROXICODONE Take 1-2 tablets (5-10 mg total) by mouth every 6 (six) hours as needed for moderate pain, severe pain or breakthrough pain.   VITAMIN C PO Take 1 tablet by mouth daily.  Disposition: Discharge disposition: 01-Home or Self Care       Discharge Instructions    Diet - low sodium heart healthy   Complete by: As directed    Increase activity slowly   Complete by: As directed        Follow-up Information    [name], MD In 2 weeks.   Specialty: General Surgery Contact information: [place] [address] [phone]              Signed: Alphonsa Overall, M.D., [facility] Surgery Office:  [phone]  12/27/2019, 7:49 AM

## 2019-12-27 NOTE — Discharge Planning (Signed)
Patient discharged home in stable condition. Verbalizes understanding of all discharge instructions, including home medications and follow up appointments. 

## 2019-12-27 NOTE — Discharge Instructions (Signed)
[facility] - DISCHARGE INSTRUCTIONS TO PATIENT  Activity:  Driving - May drive in 2 to 5 days, if doing well   Lifting - No lifting more than 15 pounds until seen by Dr. Marlou Starks                       Practice your Covid-19 protection:  Wear a mask, social distance, and wash your hands frequently  Wound Care:   Leave the incision dry for 2 days, then you may shower.        Record the amount of each drain daily.  Bring that record with you to the office.  Diet:  As tolerated  Follow up appointment:  Call Dr. Marlou Starks' s office [facility]) at [phone] for an appointment in 5 p 10 days  Medications and dosages:  Resume your home medications.  You have a prescription for:  Oxycodone and Robaxin - for pain             You may also take Tylenol, ibuprofen, or Aleve for pain  Call Dr. Marlou Starks or his office  [phone]) if you have:  Temperature greater than 100.4,  Persistent nausea and vomiting,  Redness, tenderness, or signs of infection (pain, swelling, redness, odor or green/yellow discharge around the site),  Any other questions or concerns you may have after discharge.  In an emergency, call 911 or go to an Emergency Department at a nearby hospital.

## 2019-12-29 ENCOUNTER — Encounter (HOSPITAL_COMMUNITY): Payer: Self-pay | Admitting: General Surgery

## 2020-01-01 ENCOUNTER — Encounter: Payer: Self-pay | Admitting: *Deleted

## 2020-01-01 DIAGNOSIS — Z171 Estrogen receptor negative status [ER-]: Secondary | ICD-10-CM

## 2020-01-02 ENCOUNTER — Encounter: Payer: Self-pay | Admitting: *Deleted

## 2020-01-05 LAB — SURGICAL PATHOLOGY

## 2020-01-08 ENCOUNTER — Encounter: Payer: Self-pay | Admitting: *Deleted

## 2020-01-09 ENCOUNTER — Inpatient Hospital Stay: Payer: Medicare Other

## 2020-01-09 ENCOUNTER — Telehealth: Payer: Self-pay

## 2020-01-09 ENCOUNTER — Inpatient Hospital Stay: Payer: Medicare Other | Attending: Hematology | Admitting: Hematology

## 2020-01-09 DIAGNOSIS — B1920 Unspecified viral hepatitis C without hepatic coma: Secondary | ICD-10-CM | POA: Insufficient documentation

## 2020-01-09 DIAGNOSIS — Z171 Estrogen receptor negative status [ER-]: Secondary | ICD-10-CM | POA: Insufficient documentation

## 2020-01-09 DIAGNOSIS — Z79899 Other long term (current) drug therapy: Secondary | ICD-10-CM | POA: Insufficient documentation

## 2020-01-09 DIAGNOSIS — Z5111 Encounter for antineoplastic chemotherapy: Secondary | ICD-10-CM | POA: Insufficient documentation

## 2020-01-09 DIAGNOSIS — C50412 Malignant neoplasm of upper-outer quadrant of left female breast: Secondary | ICD-10-CM | POA: Insufficient documentation

## 2020-01-09 DIAGNOSIS — Z452 Encounter for adjustment and management of vascular access device: Secondary | ICD-10-CM | POA: Insufficient documentation

## 2020-01-09 DIAGNOSIS — Z5112 Encounter for antineoplastic immunotherapy: Secondary | ICD-10-CM | POA: Insufficient documentation

## 2020-01-09 NOTE — Telephone Encounter (Signed)
I called Lindsay [name] regarding her missed appts with Dr. Burr Medico and chemo educator.  She states she was unaware of the appts.  High priority scheduling message sent to reschedule f/u with Dr. Burr Medico and chemo class.

## 2020-01-13 ENCOUNTER — Encounter: Payer: Self-pay | Admitting: *Deleted

## 2020-01-14 ENCOUNTER — Other Ambulatory Visit: Payer: Self-pay

## 2020-01-14 ENCOUNTER — Ambulatory Visit (HOSPITAL_COMMUNITY)
Admission: RE | Admit: 2020-01-14 | Discharge: 2020-01-14 | Disposition: A | Discharge status: Home / Self Care | Payer: Medicare Other | Source: Ambulatory Visit | Attending: Hematology | Admitting: Hematology

## 2020-01-14 ENCOUNTER — Telehealth: Payer: Self-pay

## 2020-01-14 DIAGNOSIS — C50412 Malignant neoplasm of upper-outer quadrant of left female breast: Secondary | ICD-10-CM

## 2020-01-14 NOTE — Telephone Encounter (Signed)
Lindsay [name] called regarding her echo appt.  She states she has an appt with surgeon on 9/14 at 1000 for possible removal of drain.  As she lives in [place] she was hoping to move the time of her ov with Dr. Burr Medico and her chemo education class.  She states she will also be in [place] on 9/13 for an 1100 rehab appt.  She wanted to know if the ov and chemo ed appts could be moved to that day or times changed on 9/14.  I told her I would get back to her regarding these appts.

## 2020-01-14 NOTE — Telephone Encounter (Signed)
Lindsay [name], from echo scheduling called stating they are unable to perform the echo on Lindsay [name] as she still has drainage tubes in her chest.  She states they will not be able to complete the echo until 2 weeks after drain is removed.  She states she will rescheduled appt.

## 2020-01-15 NOTE — Telephone Encounter (Signed)
I spoke with Lindsay [name] this afternoon and reviewed her new appt schedule.  She verbalized understanding.

## 2020-01-19 ENCOUNTER — Encounter: Payer: Medicare Other | Admitting: Physical Therapy

## 2020-01-20 ENCOUNTER — Other Ambulatory Visit: Payer: Medicare Other

## 2020-01-20 ENCOUNTER — Encounter: Payer: Self-pay | Admitting: *Deleted

## 2020-01-20 ENCOUNTER — Ambulatory Visit: Payer: Medicare Other | Admitting: Hematology

## 2020-01-26 ENCOUNTER — Encounter: Payer: Self-pay | Admitting: *Deleted

## 2020-01-26 NOTE — Progress Notes (Signed)
[place]   Telephone:(336) [phone] Fax:(336) [phone]   Clinic Follow up Note   Patient Care Team: Scotty Court, DO as PCP - General Mauro Kaufmann, RN as Oncology Nurse Navigator Rockwell Germany, RN as Oncology Nurse Navigator [name], MD as Consulting Physician (General Surgery) Truitt Merle, MD as Consulting Physician (Hematology) Eppie Gibson, MD as Attending Physician (Radiation Oncology)  Date of Service:  01/28/2020  CHIEF COMPLAINT: F/u of left breast metaplastic cancer, triple negative   SUMMARY OF ONCOLOGIC HISTORY: Oncology History Overview Note  Cancer Staging Malignant neoplasm of upper-outer quadrant of left breast in female, estrogen receptor negative ([place]) Staging form: Breast, AJCC 8th Edition - Clinical stage from 10/28/2019: Stage IIIB (cT2, cN1, cM0, G3, ER-, PR-, HER2-) - Signed by Eppie Gibson, MD on 11/05/2019 - Pathologic stage from 12/26/2019: Stage IIIC (pT3, pN3a, cM0, G3, ER-, PR-, HER2-) - Signed by Truitt Merle, MD on 01/28/2020    Malignant neoplasm of upper-outer quadrant of left breast in female, estrogen receptor negative ([place])  10/27/2019 Mammogram   IMPRESSION: 1. Right breast calcifications spanning 2.6 cm in the upper slightly medial breast are indeterminate.   2. Left breast dominant mass at 3 o'clock, 6 cm from nipple measuring 3x3.9x2.9 cm is highly suspicious. There is overlying skin thickening, skin involvement is not excluded.   3. Left breast mass/distorted tissue at 1 o'clock, 4cm from nipple measuring 3.0x1.2x2.9 cm is likely in contiguity with the dominant mass at 3 o'clock and is also highly suspicious. With the dominant lesion the overall span a disease is approximately 6 cm.   4. Left breast distortion at 2 o'clock 10 cm from the nipple is Suspicious, measuring 1.0 x 0.8 cm.   5.  Abnormal left axillary lymph nodes (2). Cortical thickness measures up to 0.6 cm.    10/28/2019 Initial Biopsy    Diagnosis 1. Breast, left, needle core biopsy, 3 o'clock, 5cmfn - INVASIVE MAMMARY CARCINOMA. 2. Breast, left, needle core biopsy, 2 o'clock, 10cmfn - INVASIVE MAMMARY CARCINOMA. 3. 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 Microscopic Comment 1. -3. Overall, immunohistochemistry favors a pronounced desmoplastic response, but metaplastic carcinoma cannot be excluded (Epithelioid component: CKAE1AE3 strong +, CK5/6 weak +). The greatest linear extent of tumor in any one core in specimen 1 is 14 mm. The greatest linear extent of tumor in any one core in specimen 2 is 16 mm.   10/28/2019 Receptors her2   3. PROGNOSTIC INDICATORS Results: IMMUNOHISTOCHEMICAL AND MORPHOMETRIC ANALYSIS PERFORMED MANUALLY The tumor cells are EQUIVOCAL for Her2 (2+). Her2 by FISH will be performed and results reported separately. Estrogen Receptor: 0%, NEGATIVE Progesterone Receptor: 0%, NEGATIVE Proliferation Marker Ki67: 10%    3. FLUORESCENCE IN-SITU HYBRIDIZATION Results: GROUP 5: HER2 **NEGATIVE** Equivocal form of amplification of the HER2 gene was detected in the IHC 2+ tissue sample received from this individual. HER2 FISH was performed by a technologist and cell imaging and analysis on the [employer].   10/28/2019 Cancer Staging   Staging form: Breast, AJCC 8th Edition - Clinical stage from 10/28/2019: Stage IIIB (cT2, cN1, cM0, G3, ER-, PR-, HER2-) - Signed by Eppie Gibson, MD on 11/05/2019   10/31/2019 Initial Diagnosis   Malignant neoplasm of upper-outer quadrant of left breast in female, estrogen receptor negative ([place])   10/31/2019 Pathology Results   Diagnosis Breast, right, needle core biopsy, UOQ, posterior - FOCAL USUAL DUCTAL HYPERPLASIA AND FIBROCYSTIC CHANGES WITH CALCIFICATIONS - FIBROADENOMATOID  CHANGES - NO MALIGNANCY IDENTIFIED Microscopic Comment These results were called to [facility] on November 03, 2019.     11/05/2019 Genetic Testing   She declined Genetic testing    11/21/2019 Imaging   CT CAP w contrast  IMPRESSION: 1. Left breast mass and prominent left axillary lymph nodes, containing biopsy marking clips, consistent with newly diagnosed breast malignancy. 2. No definite evidence of distant metastatic disease in the chest, abdomen, or pelvis. 3. There are occasional small pulmonary nodules, measuring 2-3 mm, most likely incidental sequelae of prior infection or inflammation although metastatic disease is not excluded. Attention on follow-up. 4. Hepatic steatosis. 5. Aortic Atherosclerosis (ICD10-I70.0).     11/21/2019 Imaging   Bone Scan Whole Body IMPRESSION: 1. Single focus of uptake associated with a subacute fracture of LEFT anterior fourth rib. 2. No additional areas of abnormal uptake.   12/26/2019 Cancer Staging   Staging form: Breast, AJCC 8th Edition - Pathologic stage from 12/26/2019: Stage IIIC (pT3, pN2, cM0, G3, ER-, PR-, HER2-) - Signed by Truitt Merle, MD on 01/09/2020   12/26/2019 Surgery   LEFT MASTECTOMY MODIFIED RADICAL and PAC Placement by Dr Marlou Starks   12/26/2019 Pathology Results   FINAL MICROSCOPIC DIAGNOSIS:   A. 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  - Ductal carcinoma in situ, high nuclear grade with central necrosis.  - Margins of resection:  - Metaplastic carcinoma focally involves the anterior margin and is < 1  mm from the posterior margin.  - DCIS is < 1 mm from the anterior margin.  - Metastatic carcinoma in (13) of (16) lymph nodes with extranodal  extension.  - Biopsy clip sites in breast and one lymph node.  - See oncology table.    ADDENDUM:  PROGNOSTIC INDICATOR RESULTS:  Immunohistochemical and morphometric analysis performed manually  The tumor cells are EQUIVOCAL for Her2 (2+). Her2 FISH has been ordered  and will be reported in an addendum.   Estrogen Receptor:       NEGATIVE  Progesterone Receptor:   NEGATIVE  Proliferation Marker Ki-67:   30%    ADDENDUM:  FLOURESCENCE IN-SITU HYBRIDIZATION RESULTS:  GROUP 5:   HER2 NEGATIVE   01/28/2020 Echocardiogram   Baseline Echo  IMPRESSIONS     1. Left ventricular ejection fraction, by estimation, is 60 to 65%. The  left ventricle has normal function. The left ventricle has no regional  wall motion abnormalities. Left ventricular diastolic parameters are  consistent with Grade I diastolic  dysfunction (impaired relaxation). The average left ventricular global  longitudinal strain is -19.8 %.   2. Right ventricular systolic function is normal. The right ventricular  size is normal. Tricuspid regurgitation signal is inadequate for assessing  PA pressure.   3. The mitral valve is normal in structure. No evidence of mitral valve  regurgitation. No evidence of mitral stenosis.   4. The aortic valve is normal in structure. Aortic valve regurgitation is  not visualized. No aortic stenosis is present.   5. The inferior vena cava is normal in size with greater than 50%  respiratory variability, suggesting right atrial pressure of 3 mmHg.       CURRENT THERAPY:  PENDING CT-AC   INTERVAL HISTORY:  Lindsay [name] is here for a follow up. She presents to the clinic alone. She notes her surgery went well. She notes she has seen [facility] for second opinion with  Dr Annabell Sabal and Dr Katheran James.She notes her recent rash was not shingles but has resolved. She notes she is interested in all the treatment she can get including immunotherapy. She will continue to decide where to get treatment but is leaning towards [facility] at least for CT and may continue with [facility] hear or at [facility]. She plans to start PT this week.    REVIEW OF SYSTEMS:   Constitutional: Denies fevers, chills or abnormal weight loss Eyes: Denies blurriness of vision Ears, nose, mouth, throat, and face: Denies mucositis or sore  throat Respiratory: Denies cough, dyspnea or wheezes Cardiovascular: Denies palpitation, chest discomfort or lower extremity swelling Gastrointestinal:  Denies nausea, heartburn or change in bowel habits Skin: Denies abnormal skin rashes Lymphatics: Denies new lymphadenopathy or easy bruising Neurological:Denies numbness, tingling or new weaknesses Behavioral/Psych: Mood is stable, no new changes  All other systems were reviewed with the patient and are negative.  MEDICAL HISTORY:  Past Medical History:  Diagnosis Date  . Bronchitis   . Family history of bone cancer   . Family history of breast cancer   . Family history of prostate cancer   . Fibromyalgia   . HCV (hepatitis C virus)   . MRSA (methicillin resistant Staphylococcus aureus) 2012    SURGICAL HISTORY: Past Surgical History:  Procedure Laterality Date  . MASTECTOMY MODIFIED RADICAL Left 12/26/2019   Procedure: LEFT MASTECTOMY MODIFIED RADICAL;  Surgeon: [name], MD;  Location: [facility];  Service: General;  Laterality: Left;  PEC BLOCK  . PORTACATH PLACEMENT Right 12/26/2019   Procedure: INSERTION PORT-A-CATH WITH ULTRASOUND GUIDANCE;  Surgeon: [name], MD;  Location: [facility];  Service: General;  Laterality: Right;    I have reviewed the social history and family history with the patient and they are unchanged from previous note.  ALLERGIES:  is allergic to codeine.  MEDICATIONS:  Current Outpatient Medications  Medication Sig Dispense Refill  . acetaminophen (TYLENOL) 650 MG CR tablet Take 650 mg by mouth every 8 (eight) hours as needed for pain.     . Ascorbic Acid (VITAMIN C PO) Take 1 tablet by mouth daily.    . methocarbamol (ROBAXIN) 750 MG tablet Take 1 tablet (750 mg total) by mouth 4 (four) times daily as needed (use for muscle cramps/pain). 30 tablet 2  . oxyCODONE (OXY IR/ROXICODONE) 5 MG immediate release tablet Take 1 tablet (5 mg total) by mouth every 6 (six) hours as needed (for pain score of  1-4). 20 tablet 0  . oxyCODONE (OXY IR/ROXICODONE) 5 MG immediate release tablet Take 1-2 tablets (5-10 mg total) by mouth every 6 (six) hours as needed for moderate pain, severe pain or breakthrough pain. 20 tablet 0   No current facility-administered medications for this visit.    PHYSICAL EXAMINATION: ECOG PERFORMANCE STATUS: 1 - Symptomatic but completely ambulatory  Vitals:   01/28/20 1017  BP: (!) 146/79  Pulse: 69  Resp: 20  Temp: (!) 97 F (36.1 C)  SpO2: 98%   Filed Weights   01/28/20 1017  Weight: 193 lb 9.6 oz (87.8 kg)    GENERAL:alert, no distress and comfortable SKIN: skin color, texture, turgor are normal, no rashes or significant lesions EYES: normal, Conjunctiva are pink and non-injected, sclera clear  NECK: supple, thyroid normal size, non-tender, without nodularity LYMPH:  no palpable lymphadenopathy in the cervical, axillary  LUNGS: clear to auscultation and percussion with normal breathing effort HEART: regular rate & rhythm and no murmurs and no lower extremity  edema ABDOMEN:abdomen soft, non-tender and normal bowel sounds Musculoskeletal:no cyanosis of digits and no clubbing  NEURO: alert & oriented x 3 with fluent speech, no focal motor/sensory deficits BREAST: S/p Left mastectomy: Surgical incision healed well with mild skin erythema at incision (+) 1.5cm LN in left axilla. Right Breast exam benign.   LABORATORY DATA:  I have reviewed the data as listed CBC Latest Ref Rng & Units 12/23/2019 11/05/2019  WBC 4.0 - 10.5 K/uL 7.0 5.1  Hemoglobin 12.0 - 15.0 g/dL 14.6 13.6  Hematocrit 36 - 46 % 45.1 40.7  Platelets 150 - 400 K/uL 168 152     CMP Latest Ref Rng & Units 12/23/2019 11/05/2019  Glucose 70 - 99 mg/dL 117(H) 106(H)  BUN 8 - 23 mg/dL 10 13  Creatinine 0.44 - 1.00 mg/dL 0.69 0.79  Sodium 135 - 145 mmol/L 141 143  Potassium 3.5 - 5.1 mmol/L 5.3(H) 4.6  Chloride 98 - 111 mmol/L 105 108  CO2 22 - 32 mmol/L 29 27  Calcium 8.9 - 10.3 mg/dL 9.5  9.3  Total Protein 6.5 - 8.1 g/dL - 6.6  Total Bilirubin 0.3 - 1.2 mg/dL - 0.6  Alkaline Phos 38 - 126 U/L - 71  AST 15 - 41 U/L - 33  ALT 0 - 44 U/L - 17      RADIOGRAPHIC STUDIES: I have personally reviewed the radiological images as listed and agreed with the findings in the report. ECHOCARDIOGRAM COMPLETE  Result Date: 01/28/2020    ECHOCARDIOGRAM REPORT   Patient Name:   Lindsay [name] Date of Exam: 01/28/2020 Medical Rec #:  [id number]            Height:       61.0 in Accession #:    [id number]           Weight:       194.0 lb Date of Birth:  [birth year]             BSA:          1.864 m Patient Age:    48 years             BP:           131/64 mmHg Patient Gender: F                    HR:           80 bpm. Exam Location:  Outpatient Procedure: 2D Echo, Cardiac Doppler, Color Doppler and Strain Analysis Indications:    Chemotherapy evaluation v87.41 / v58.11  History:        Patient has no prior history of Echocardiogram examinations.                 Risk Factors:Non-Smoker.  Sonographer:    Vickie Epley RDCS Referring Phys: [id number] Truitt Merle  Sonographer Comments: Image acquisition challenging due to mastectomy. IMPRESSIONS  1. Left ventricular ejection fraction, by estimation, is 60 to 65%. The left ventricle has normal function. The left ventricle has no regional wall motion abnormalities. Left ventricular diastolic parameters are consistent with Grade I diastolic dysfunction (impaired relaxation). The average left ventricular global longitudinal strain is -19.8 %.  2. Right ventricular systolic function is normal. The right ventricular size is normal. Tricuspid regurgitation signal is inadequate for assessing PA pressure.  3. The mitral valve is normal in structure. No evidence of mitral valve regurgitation. No evidence of mitral stenosis.  4. The aortic valve is normal  in structure. Aortic valve regurgitation is not visualized. No aortic stenosis is present.  5. The inferior vena cava is  normal in size with greater than 50% respiratory variability, suggesting right atrial pressure of 3 mmHg. FINDINGS  Left Ventricle: Left ventricular ejection fraction, by estimation, is 60 to 65%. The left ventricle has normal function. The left ventricle has no regional wall motion abnormalities. The average left ventricular global longitudinal strain is -19.8 %. The left ventricular internal cavity size was normal in size. There is no left ventricular hypertrophy. Left ventricular diastolic parameters are consistent with Grade I diastolic dysfunction (impaired relaxation). Normal left ventricular filling pressure. Right Ventricle: The right ventricular size is normal. No increase in right ventricular wall thickness. Right ventricular systolic function is normal. Tricuspid regurgitation signal is inadequate for assessing PA pressure. Left Atrium: Left atrial size was normal in size. Right Atrium: Right atrial size was normal in size. Pericardium: There is no evidence of pericardial effusion. Mitral Valve: The mitral valve is normal in structure. No evidence of mitral valve regurgitation. No evidence of mitral valve stenosis. Tricuspid Valve: The tricuspid valve is normal in structure. Tricuspid valve regurgitation is not demonstrated. No evidence of tricuspid stenosis. Aortic Valve: The aortic valve is normal in structure. Aortic valve regurgitation is not visualized. No aortic stenosis is present. Pulmonic Valve: The pulmonic valve was normal in structure. Pulmonic valve regurgitation is not visualized. No evidence of pulmonic stenosis. Aorta: The aortic root is normal in size and structure. Venous: The inferior vena cava is normal in size with greater than 50% respiratory variability, suggesting right atrial pressure of 3 mmHg. IAS/Shunts: No atrial level shunt detected by color flow Doppler.  LEFT VENTRICLE PLAX 2D LVIDd:         5.10 cm      Diastology LVIDs:         3.20 cm      LV e' medial:    6.85 cm/s LV  PW:         0.60 cm      LV E/e' medial:  5.9 LV IVS:        0.60 cm      LV e' lateral:   6.85 cm/s LVOT diam:     2.10 cm      LV E/e' lateral: 5.9 LV SV:         74 LV SV Index:   40           2D Longitudinal Strain LVOT Area:     3.46 cm     2D Strain GLS Avg:     -19.8 %  LV Volumes (MOD) LV vol d, MOD A2C: 78.2 ml LV vol d, MOD A4C: 109.0 ml LV vol s, MOD A2C: 33.6 ml LV vol s, MOD A4C: 39.9 ml LV SV MOD A2C:     44.6 ml LV SV MOD A4C:     109.0 ml LV SV MOD BP:      55.9 ml RIGHT VENTRICLE RV S prime:     8.59 cm/s TAPSE (M-mode): 1.7 cm LEFT ATRIUM             Index       RIGHT ATRIUM           Index LA diam:        3.60 cm 1.93 cm/m  RA Area:     11.30 cm LA Vol (A2C):   16.2 ml 8.69 ml/m  RA Volume:   23.90 ml  12.82 ml/m LA Vol (A4C):   31.2 ml 16.73 ml/m LA Biplane Vol: 24.0 ml 12.87 ml/m  AORTIC VALVE LVOT Vmax:   108.00 cm/s LVOT Vmean:  67.300 cm/s LVOT VTI:    0.215 m  AORTA Ao Root diam: 3.00 cm MITRAL VALVE MV Area (PHT): 2.03 cm    SHUNTS MV Decel Time: 373 msec    Systemic VTI:  0.22 m MV E velocity: 40.70 cm/s  Systemic Diam: 2.10 cm MV A velocity: 57.00 cm/s MV E/A ratio:  0.71 Mihai Croitoru MD Electronically signed by Sanda Klein MD Signature Date/Time: 01/28/2020/10:24:50 AM    Final      ASSESSMENT & PLAN:  Lindsay [name] is a 64 y.o. female with    1. 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  -Her 11/2019 CT CAP and bone scan was negative for distant metastasis.  -She underwent left mastectomy with Dr Marlou Starks on 12/26/19. We discussed her pathology which showed 6cm multifocal metaplastic carcinoma and 13/16 positive LN but unfortunately 1 positive LN was not able to be removed given it invasion of a vessel.  -Due to the residual axillary lymph  node, adjuvant radiation including axillary coverage, is critical, and I am not sure if adjuvant therapy including chemo and radiation can cure the residual disease -I discussed she has a very high risk of cancer progression and distant recurrence.  -I discussed metaplastic carcinoma is a rare type breast cancer and adjuvant therapy data is limited.  We reviewed that this type of breast cancer is not very sensitive to chemotherapy, but adjuvant chemotherapy is still recommended, and hopefully reduce her risk of recurrence. -She has been seen by [facility]'s Dr Annabell Sabal who recommended adjuvant chemo with Keytruda, Carboplatin/Taxol followed by Adriamycin/Cytoxan based on the [place]. I agree this in general, but given poor chemo response in metaplastic breast cancer, and the potential permanent cardiomyopathy from Adriamycin, I think carboplatin, Taxol for [redacted] weeks along with one year Beryle Flock is also reasonable. Early clinical trial data demonstrated partial response from dual immunotherapy in metaplastic breast cancer, I think is very reasonable to include Keytruda in the adjuvant setting.   -After lengthy discussion she opted to proceed with CT-AC along with Keytruda. She is still undecided if she will have chemo at [facility] or our clinic.  --Chemotherapy consent: Side effects including but does not not limited to, fatigue, nausea, vomiting, diarrhea, hair loss, neuropathy, fluid retention, renal and kidney dysfunction, neutropenic fever, needed for blood transfusion, bleeding, irreversible cardiomyopathy, small risk of leukemia or MDS, immunotherapy related autoimmune disease, such as pneumonitis, colitis, cardiomyopathy, and endocrine disorder, were discussed with patient in great detail. She agrees to proceed. -The goal of therapy is curative -Her baseline Echo on 01/28/20 was adequate with mild diastolic dysfunction. Will repeat after AC.  -She has had PAC placed with her surgery. I discussed Dignicap and  Neuropathy study options with her. She will proceed with chemo education class today.  -She has 1.5cm Left axillary LN on exam today which can be monitored for clinical response on chemo. -She has recovered well from surgery and fine to start chemo next week here or at [facility]. She plans to start PT for her left breast this week.    2. Hepatitis C, untreated, diagnosed in 1992 -I will refer her to [facility] or [facility]  for treatment after she completed chemo.  -She also notes a history of MRSA which she is not sure has completed cleared. -We will watch her liver functions closely during the chemotherapy.  We discussed her that she may have hepatitis C flare when she has chemo    3. Social and financial support  -She has no children and no close relatives. She does have close friends who are supportive.  -She did not have medical insurance for many years. She recently applied for blue cross and blue shield which is not effective yet.    PLAN: -chemo education class today  -Plan to proceed with adjuvant chemotherapy: Weekly carboplatin AUC 1.5, Taxol 80 mg/m for 12 weeks, followed by dose dense Adriamycin and Cytoxan every 2 weeks with G-CSF, for 4 cycles, and Keytruda 200 mg every 3 weeks for 1 year -she will inform us if she will get chemo in our office or at [facility], plan to start next week -I will submit chemo PA to her insurance    No problem-specific Assessment & Plan notes found for this encounter.   No orders of the defined types were placed in this encounter.  All questions were answered. The patient knows to call the clinic with any problems, questions or concerns. No barriers to learning was detected. The total time spent in the appointment was 45 minutes.     Truitt Merle, MD 01/28/2020   I, Joslyn Devon, am acting as scribe for Truitt Merle, MD.   I have reviewed the above documentation for accuracy and completeness, and I agree with the above.

## 2020-01-28 ENCOUNTER — Inpatient Hospital Stay: Payer: Medicare Other

## 2020-01-28 ENCOUNTER — Other Ambulatory Visit: Payer: Self-pay

## 2020-01-28 ENCOUNTER — Ambulatory Visit (HOSPITAL_COMMUNITY)
Admission: RE | Admit: 2020-01-28 | Discharge: 2020-01-28 | Disposition: A | Discharge status: Home / Self Care | Payer: Medicare Other | Source: Ambulatory Visit | Attending: Hematology | Admitting: Hematology

## 2020-01-28 ENCOUNTER — Encounter: Payer: Self-pay | Admitting: Hematology

## 2020-01-28 ENCOUNTER — Inpatient Hospital Stay (HOSPITAL_BASED_OUTPATIENT_CLINIC_OR_DEPARTMENT_OTHER): Payer: Medicare Other | Admitting: Hematology

## 2020-01-28 VITALS — BP 146/79 | HR 69 | Temp 97.0°F | Resp 20 | Ht 61.0 in | Wt 193.6 lb

## 2020-01-28 DIAGNOSIS — Z5111 Encounter for antineoplastic chemotherapy: Secondary | ICD-10-CM | POA: Diagnosis present

## 2020-01-28 DIAGNOSIS — Z171 Estrogen receptor negative status [ER-]: Secondary | ICD-10-CM

## 2020-01-28 DIAGNOSIS — Z01818 Encounter for other preprocedural examination: Secondary | ICD-10-CM | POA: Insufficient documentation

## 2020-01-28 DIAGNOSIS — C50412 Malignant neoplasm of upper-outer quadrant of left female breast: Secondary | ICD-10-CM

## 2020-01-28 DIAGNOSIS — B1920 Unspecified viral hepatitis C without hepatic coma: Secondary | ICD-10-CM | POA: Diagnosis not present

## 2020-01-28 DIAGNOSIS — Z79899 Other long term (current) drug therapy: Secondary | ICD-10-CM | POA: Diagnosis not present

## 2020-01-28 DIAGNOSIS — Z5112 Encounter for antineoplastic immunotherapy: Secondary | ICD-10-CM | POA: Diagnosis not present

## 2020-01-28 DIAGNOSIS — Z452 Encounter for adjustment and management of vascular access device: Secondary | ICD-10-CM | POA: Diagnosis not present

## 2020-01-28 LAB — ECHOCARDIOGRAM COMPLETE
Area-P 1/2: 2.03 cm2
Calc EF: 60.4 %
S' Lateral: 3.2 cm
Single Plane A2C EF: 57 %
Single Plane A4C EF: 63.4 %

## 2020-01-28 MED ORDER — ONDANSETRON HCL 8 MG PO TABS: 8.0000 mg | ORAL_TABLET | Freq: Two times a day (BID) | ORAL | 1 refills | Status: DC | PRN

## 2020-01-28 MED ORDER — DEXAMETHASONE 4 MG PO TABS: 8.0000 mg | ORAL_TABLET | Freq: Every day | ORAL | 1 refills | Status: DC

## 2020-01-28 MED ORDER — PROCHLORPERAZINE MALEATE 10 MG PO TABS: 10.0000 mg | ORAL_TABLET | Freq: Four times a day (QID) | ORAL | 1 refills | Status: DC | PRN

## 2020-01-28 NOTE — Progress Notes (Signed)
START OFF PATHWAY REGIMEN - Breast   [id number]:Carboplatin AUC=6 D1 + Paclitaxel 80 mg/m2 D1,8,15 q21 Days C1-4 followed by [employer] q21 Days C5-8:   A cycle is every 21 days:     Paclitaxel      Carboplatin      Doxorubicin      Cyclophosphamide   **Always confirm dose/schedule in your pharmacy ordering system**  Patient Characteristics: Postoperative without Neoadjuvant Therapy (Pathologic Staging), Invasive Disease, Adjuvant Therapy, HER2 Negative/Unknown/Equivocal, ER Negative/Unknown, Node Positive Therapeutic Status: Postoperative without Neoadjuvant Therapy (Pathologic Staging) AJCC Grade: G3 AJCC N Category: pN3a AJCC M Category: cM0 ER Status: Negative (-) AJCC 8 Stage Grouping: IIIC HER2 Status: Negative (-) Oncotype Dx Recurrence Score: Not Appropriate AJCC T Category: pT3 PR Status: Negative (-) Intent of Therapy: Curative Intent, Discussed with Patient

## 2020-01-28 NOTE — Progress Notes (Signed)
  Echocardiogram 2D Echocardiogram has been performed.  Michiel Cowboy 01/28/2020, 10:11 AM

## 2020-01-29 ENCOUNTER — Ambulatory Visit: Payer: Medicare Other | Attending: General Surgery | Admitting: Physical Therapy

## 2020-01-29 ENCOUNTER — Encounter: Payer: Self-pay | Admitting: Physical Therapy

## 2020-01-29 ENCOUNTER — Encounter: Payer: Self-pay | Admitting: *Deleted

## 2020-01-29 DIAGNOSIS — C50412 Malignant neoplasm of upper-outer quadrant of left female breast: Secondary | ICD-10-CM | POA: Diagnosis not present

## 2020-01-29 DIAGNOSIS — M25612 Stiffness of left shoulder, not elsewhere classified: Secondary | ICD-10-CM

## 2020-01-29 DIAGNOSIS — R293 Abnormal posture: Secondary | ICD-10-CM

## 2020-01-29 DIAGNOSIS — M79622 Pain in left upper arm: Secondary | ICD-10-CM | POA: Diagnosis present

## 2020-01-29 DIAGNOSIS — Z170 Estrogen receptor positive status [ER+]: Secondary | ICD-10-CM | POA: Diagnosis present

## 2020-01-29 DIAGNOSIS — Z483 Aftercare following surgery for neoplasm: Secondary | ICD-10-CM

## 2020-01-29 NOTE — Therapy (Signed)
[place], [place], [address] Phone: [phone]   Fax:  [phone]  Physical Therapy Treatment  Patient Details  Name: Lindsay [name] MRN: [id number] Date of Birth: [birth year] Referring Provider (PT): Dr. Autumn Messing   Encounter Date: 01/29/2020   PT End of Session - 01/29/20 1630    Visit Number 2    Number of Visits 10    Date for PT Re-Evaluation 02/26/20    PT Start Time 1103    PT Stop Time 1137    PT Time Calculation (min) 34 min    Activity Tolerance Patient tolerated treatment well    Behavior During Therapy [facility] for tasks assessed/performed           Past Medical History:  Diagnosis Date  . Bronchitis   . Family history of bone cancer   . Family history of breast cancer   . Family history of prostate cancer   . Fibromyalgia   . HCV (hepatitis C virus)   . MRSA (methicillin resistant Staphylococcus aureus) 2012    Past Surgical History:  Procedure Laterality Date  . MASTECTOMY MODIFIED RADICAL Left 12/26/2019   Procedure: LEFT MASTECTOMY MODIFIED RADICAL;  Surgeon: [name], MD;  Location: [place];  Service: General;  Laterality: Left;  PEC BLOCK  . PORTACATH PLACEMENT Right 12/26/2019   Procedure: INSERTION PORT-A-CATH WITH ULTRASOUND GUIDANCE;  Surgeon: [name], MD;  Location: [place];  Service: General;  Laterality: Right;    There were no vitals filed for this visit.   Subjective Assessment - 01/29/20 1104    Subjective Patient underwent a left modified radical mastectomy on 12/26/2019 with 13 of 16 were positive for cancer. She begins chemotherapy next week followed by radiation.    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 Patient underwent a left modified radical mastectomy on 12/26/2019 with 13 of 16 were positive for cancer. The Ki67 is 10%.    Patient Stated Goals Get my arm better     Currently in Pain? Yes    Pain Score 8     Pain Location Axilla    Pain Orientation Left    Pain Descriptors / Indicators Tightness    Pain Type Surgical pain    Pain Onset More than a month ago    Pain Frequency Intermittent    Aggravating Factors  Reaching    Pain Relieving Factors rest              [facility] PT Assessment - 01/29/20 0001      Assessment   Medical Diagnosis s/p left MRM    Referring Provider (PT) Dr. Autumn Messing    Onset Date/Surgical Date 12/26/19    Hand Dominance Right    Prior Therapy Baselines      Precautions   Precautions Other (comment)    Precaution Comments active cancer      Restrictions   Weight Bearing Restrictions No      Balance Screen   Has the patient fallen in the past 6 months No    Has the patient had a decrease in activity level because of a fear of falling?  No    Is the patient reluctant to leave their home because of a fear of falling?  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 There is significant scar tissue which is tight but appears to be healing very well.       Posture/Postural Control   Posture/Postural Control Postural limitations    Postural Limitations Rounded Shoulders;Forward head      ROM / Strength   AROM / PROM / Strength AROM      AROM   AROM Assessment Site Shoulder    Right/Left Shoulder Left    Left Shoulder Extension 52 Degrees    Left Shoulder Flexion 98 Degrees    Left Shoulder ABduction 110 Degrees    Left Shoulder Internal Rotation 80 Degrees    Left Shoulder External Rotation 69  Degrees      Strength   Overall Strength Unable to assess;Due to precautions             LYMPHEDEMA/ONCOLOGY QUESTIONNAIRE - 01/29/20 0001      Type   Cancer Type Left breast cancer      Surgeries   Radical Mastectomy Date 12/26/19    Axillary Lymph Node Dissection Date 12/26/19    Number Lymph Nodes Removed 16      Treatment   Active Chemotherapy Treatment No    Past Chemotherapy Treatment No    Active Radiation Treatment No    Past Radiation Treatment No    Current Hormone Treatment No    Past Hormone Therapy No      What other symptoms do you have   Are you Having Heaviness or Tightness Yes    Are you having Pain Yes    Are you having pitting edema No    Is it Hard or Difficult finding clothes that fit No    Do you have infections No    Is there Decreased scar mobility Yes    Stemmer Sign No      Lymphedema Assessments   Lymphedema Assessments Upper extremities      Right Upper Extremity Lymphedema   10 cm Proximal to Olecranon Process 33 cm    Olecranon Process 27.5 cm    10 cm Proximal to Ulnar Styloid Process 23.8 cm    Just Proximal to Ulnar Styloid Process 17 cm    Across Hand at [employer] 19.4 cm    At [place] of 2nd Digit 7 cm      Left Upper Extremity Lymphedema   10 cm Proximal to Olecranon Process 35 cm    Olecranon Process 27.8 cm    10 cm Proximal to Ulnar Styloid Process 24 cm    Just Proximal to Ulnar Styloid Process 18.2 cm    Across Hand at [employer] 19.3 cm    At [place] of 2nd Digit 6.8 cm              Quick Dash - 01/29/20 0001    Open a tight or new jar Unable    Do heavy household chores (wash walls, wash floors) No difficulty    Carry a shopping bag or briefcase No difficulty    Wash your back No difficulty    Use a knife to cut food No difficulty    Recreational  activities in which you take some force or impact through your arm, shoulder, or hand (golf, hammering, tennis) No difficulty    During the past week, to  what extent has your arm, shoulder or hand problem interfered with your normal social activities with family, friends, neighbors, or groups? Slightly    During the past week, to what extent has your arm, shoulder or hand problem limited your work or other regular daily activities Slightly    Arm, shoulder, or hand pain. Mild    Tingling (pins and needles) in your arm, shoulder, or hand Mild    Difficulty Sleeping So much difficuSo much difficulty, I can't sleep    DASH Score 27.27 %                          PT Education - 01/29/20 1630    Education Details HEP, follow up care, lymphedema risk reduction    Person(s) Educated Patient    Methods Explanation;Demonstration;Handout    Comprehension Returned demonstration;Verbalized understanding               PT Long Term Goals - 01/29/20 1638      PT LONG TERM GOAL #1   Title Patient will demonstrate she has regained full shoulder ROM and function post operatively compared to baselines.    Time 4    Period Weeks    Status On-going    Target Date 02/26/20      PT LONG TERM GOAL #2   Title Patient will increase left shoulder active flexion to >/= 130 degrees for increased ease reacing overhead.    Baseline 98 degrees post op; 146 pre-op    Time 4    Period Weeks    Status New    Target Date 02/26/20      PT LONG TERM GOAL #3   Title Patient will increase left shoulder active abduction to >/= 130 degrees for ability to obtain radiation positioning.    Baseline 110 degrees post op; 151 degrees pre-op    Time 4    Period Weeks    Status New    Target Date 02/26/20      PT LONG TERM GOAL #4   Title Patient will improve the Quick DASH score to be </= 10 for improved overall upper extremity function.    Baseline 27.27    Time 4    Period Weeks    Status New    Target Date 02/26/20      PT LONG TERM GOAL #5   Title Patient will verbalize good understanding of lymphedema risk reduction practices after  attending the ABC class.    Time 4    Period Weeks    Status New    Target Date 02/26/20                 Plan - 01/29/20 1631    Clinical Impression Statement Patient is doing well s/p left radical mastectomy on 12/26/2019. She had 16 axillary lymph nodes removed and 13 were positive. She plans to undergo chemotherapy beginning next week followed by radiation. She has been massaging her incision and it appears to be healing very well. She has significantly limited shoulder ROM and her scar appears deep and stuck down. There are no signs of lymphedema but she is high risk and will have L-Dex screens every 3 months for 2 years to detect subclinical lymphedema. She will benefit from PT to address shoulder ROM  and function, posture, pain, and scar tissue. She will go to [facility] in [place] for this PT as it is closer to home. Oncology PT has communicated with that team to ensure continuity of care.    PT Frequency 2x / week    PT Duration 4 weeks    PT Treatment/Interventions ADLs/Self Care Home Management;Therapeutic exercise;Patient/family education;Manual techniques;Passive range of motion;Scar mobilization;Therapeutic activities    PT Next Visit Plan PROM, myofascial release to left axillary region, AAROM exercises    PT Home Exercise Plan Post op shoulder ROM HEP    Consulted and Agree with Plan of Care Patient           Patient will benefit from skilled therapeutic intervention in order to improve the following deficits and impairments:  Postural dysfunction, Decreased range of motion, Decreased knowledge of precautions, Impaired UE functional use, Pain, Increased fascial restricitons, Decreased scar mobility  Visit Diagnosis: Malignant neoplasm of upper-outer quadrant of left breast in female, estrogen receptor positive ([place]) - Plan: PT plan of care cert/re-cert  Abnormal posture - Plan: PT plan of care cert/re-cert  Stiffness of left shoulder, not elsewhere  classified - Plan: PT plan of care cert/re-cert  Pain in left upper arm - Plan: PT plan of care cert/re-cert  Aftercare following surgery for neoplasm - Plan: PT plan of care cert/re-cert     Problem List Patient Active Problem List   Diagnosis Date Noted  . Family history of breast cancer   . Family history of prostate cancer   . Family history of bone cancer   . Malignant neoplasm of upper-outer quadrant of left breast in female, estrogen receptor negative ([place]) 10/31/2019   Annia Friendly, PT 01/29/20 4:46 PM  [place], [place], [address] Phone: [phone]   Fax:  [phone]  Name: Monia Timmers MRN: [id number] Date of Birth: 10/08/1955

## 2020-01-29 NOTE — Patient Instructions (Signed)
° ° °          [facility]         [address], [place] [address]         [phone]         Annia Friendly, PT, CLT   After Breast Cancer Class It is recommended you attend the ABC class to be educated on lymphedema risk reduction. This class is free of charge and lasts for 1 hour. It is a 1-time class.     Home exercise Program Begin doing the exercises I gave you - at least 2 times each day.   Follow up PT: It is recommended you return every 3 months for the first 3 years following surgery to be assessed on the SOZO machine for an L-Dex score. This helps prevent clinically significant lymphedema in 95% of patients. These follow up screens are 15 minute appointments that you are not billed for.

## 2020-01-30 ENCOUNTER — Encounter: Payer: Self-pay | Admitting: Hematology

## 2020-01-30 ENCOUNTER — Other Ambulatory Visit: Payer: Self-pay | Admitting: Hematology

## 2020-01-30 ENCOUNTER — Telehealth: Payer: Self-pay

## 2020-01-30 ENCOUNTER — Telehealth: Payer: Self-pay | Admitting: Hematology

## 2020-01-30 MED ORDER — AMOXICILLIN-POT CLAVULANATE 875-125 MG PO TABS: 1.0000 | ORAL_TABLET | Freq: Two times a day (BID) | ORAL | 0 refills | Status: DC

## 2020-01-30 NOTE — Telephone Encounter (Signed)
No 9/22 los

## 2020-01-30 NOTE — Telephone Encounter (Signed)
Lindsay [name] called stating her chronic bronchitis is "flaring up".  She has a dry cough occasional burning in her chest.  She is afebrile.  She is requesting augment be called to her pharmacy.

## 2020-02-01 ENCOUNTER — Other Ambulatory Visit: Payer: Self-pay | Admitting: Hematology

## 2020-02-02 ENCOUNTER — Encounter: Payer: Self-pay | Admitting: *Deleted

## 2020-02-02 ENCOUNTER — Telehealth: Payer: Self-pay | Admitting: Hematology

## 2020-02-02 ENCOUNTER — Encounter: Payer: Self-pay | Admitting: Physical Therapy

## 2020-02-02 ENCOUNTER — Ambulatory Visit: Payer: Medicare Other | Admitting: Physical Therapy

## 2020-02-02 DIAGNOSIS — M79622 Pain in left upper arm: Secondary | ICD-10-CM

## 2020-02-02 DIAGNOSIS — Z170 Estrogen receptor positive status [ER+]: Secondary | ICD-10-CM

## 2020-02-02 DIAGNOSIS — Z483 Aftercare following surgery for neoplasm: Secondary | ICD-10-CM

## 2020-02-02 DIAGNOSIS — M25612 Stiffness of left shoulder, not elsewhere classified: Secondary | ICD-10-CM

## 2020-02-02 DIAGNOSIS — R293 Abnormal posture: Secondary | ICD-10-CM

## 2020-02-02 DIAGNOSIS — C50412 Malignant neoplasm of upper-outer quadrant of left female breast: Secondary | ICD-10-CM | POA: Diagnosis not present

## 2020-02-02 NOTE — Telephone Encounter (Signed)
Scheduled appt per 9/26 sch msg - unable to reach pt - left message for patient with appt date and time

## 2020-02-02 NOTE — Progress Notes (Signed)
Pharmacist Chemotherapy Monitoring - Initial Assessment    Anticipated start date: 02/06/20  Regimen:  . Are orders appropriate based on the patient's diagnosis, regimen, and cycle? Yes . Does the plan date match the patient's scheduled date? Yes . Is the sequencing of drugs appropriate? Yes . Are the premedications appropriate for the patient's regimen? Yes . Prior Authorization for treatment is: Pending o If applicable, is the correct biosimilar selected based on the patient's insurance? yes  Organ Function and Labs: Marland Kitchen Are dose adjustments needed based on the patient's renal function, hepatic function, or hematologic function? Yes . Are appropriate labs ordered prior to the start of patient's treatment? Yes . Other organ system assessment, if indicated: anthracyclines: Echo/ MUGA . The following baseline labs, if indicated, have been ordered: N/A  Dose Assessment: . Are the drug doses appropriate? Yes . Are the following correct: o Drug concentrations Yes o IV fluid compatible with drug Yes o Administration routes Yes o Timing of therapy Yes . If applicable, does the patient have documented access for treatment and/or plans for port-a-cath placement? yes . If applicable, have lifetime cumulative doses been properly documented and assessed? not applicable Lifetime Dose Tracking  No doses have been documented on this patient for the following tracked chemicals: Doxorubicin, Epirubicin, Idarubicin, Daunorubicin, Mitoxantrone, Bleomycin, Oxaliplatin, Carboplatin, Liposomal Doxorubicin  o   Toxicity Monitoring/Prevention: . The patient has the following take home antiemetics prescribed: Prochlorperazine . The patient has the following take home medications prescribed: N/A . Medication allergies and previous infusion related reactions, if applicable, have been reviewed and addressed. Yes . The patient's current medication list has been assessed for drug-drug interactions with their  chemotherapy regimen. no significant drug-drug interactions were identified on review.  Order Review: . Are the treatment plan orders signed? No . Is the patient scheduled to see a provider prior to their treatment? Yes  I verify that I have reviewed each item in the above checklist and answered each question accordingly.  Philomena Course 02/02/2020 2:41 PM

## 2020-02-02 NOTE — Therapy (Signed)
[facility] [place], [place], [address] Phone: [phone]   Fax:  [phone]  Physical Therapy Treatment  Patient Details  Name: Lindsay [name] MRN: [id number] Date of Birth: 1955-12-26 Referring Provider (PT): Dr. Autumn Messing   Encounter Date: 02/02/2020   PT End of Session - 02/02/20 1444    Visit Number 3    Number of Visits 10    Date for PT Re-Evaluation 02/26/20    PT Start Time 1430    PT Stop Time 1515    PT Time Calculation (min) 45 min    Activity Tolerance Patient tolerated treatment well    Behavior During Therapy [facility] for tasks assessed/performed           Past Medical History:  Diagnosis Date  . Bronchitis   . Family history of bone cancer   . Family history of breast cancer   . Family history of prostate cancer   . Fibromyalgia   . HCV (hepatitis C virus)   . MRSA (methicillin resistant Staphylococcus aureus) 2012    Past Surgical History:  Procedure Laterality Date  . MASTECTOMY MODIFIED RADICAL Left 12/26/2019   Procedure: LEFT MASTECTOMY MODIFIED RADICAL;  Surgeon: [name], MD;  Location: [place];  Service: General;  Laterality: Left;  PEC BLOCK  . PORTACATH PLACEMENT Right 12/26/2019   Procedure: INSERTION PORT-A-CATH WITH ULTRASOUND GUIDANCE;  Surgeon: [name], MD;  Location: [place];  Service: General;  Laterality: Right;    There were no vitals filed for this visit.   Subjective Assessment - 02/02/20 1440    Subjective COVID-19 screening performed upon arrival. Patient arrives with some reports of stiffness and tightness but overall doing well. Chemotherapy infusion starts this week.    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 Patient underwent a left modified radical mastectomy on 12/26/2019 with 13 of 16 were positive for cancer. The Ki67 is 10%.    Patient Stated Goals Get my arm better     Currently in Pain? Yes    Pain Location Axilla    Pain Orientation Left    Pain Descriptors / Indicators Tightness    Pain Type Surgical pain              [facility] PT Assessment - 02/02/20 0001      Assessment   Medical Diagnosis s/p left MRM    Referring Provider (PT) Dr. Autumn Messing    Onset Date/Surgical Date 12/26/19    Hand Dominance Right    Next MD Visit "next week"    Prior Therapy Baselines      Precautions   Precautions Other (comment)    Precaution Comments active cancer,       ROM / Strength   AROM / PROM / Strength PROM      AROM   AROM Assessment Site --    Right/Left Shoulder --    Left Shoulder Flexion --    Left Shoulder ABduction --    Left Shoulder Internal Rotation --    Left Shoulder External Rotation --      PROM   PROM Assessment Site Shoulder    Right/Left Shoulder Left    Left Shoulder Flexion 154 Degrees    Left Shoulder ABduction 126 Degrees    Left Shoulder Internal Rotation 82 Degrees    Left Shoulder External Rotation 76 Degrees  [place] Adult PT Treatment/Exercise - 02/02/20 0001      Exercises   Exercises Shoulder      Shoulder Exercises: Pulleys   Flexion 5 minutes      Shoulder Exercises: ROM/Strengthening   Ranger seated ranger flexion, CW, CCW circles x3 mins each      Manual Therapy   Manual Therapy Passive ROM    Passive ROM PROM to left shoulder into flexion, ER, IR, and abduction with gentle holds to improve ROM                       PT Long Term Goals - 01/29/20 1638      PT LONG TERM GOAL #1   Title Patient will demonstrate she has regained full shoulder ROM and function post operatively compared to baselines.    Time 4    Period Weeks    Status On-going    Target Date 02/26/20      PT LONG TERM GOAL #2   Title Patient will increase left shoulder active flexion to >/= 130 degrees for increased ease reacing overhead.    Baseline 98 degrees post op; 146 pre-op     Time 4    Period Weeks    Status New    Target Date 02/26/20      PT LONG TERM GOAL #3   Title Patient will increase left shoulder active abduction to >/= 130 degrees for ability to obtain radiation positioning.    Baseline 110 degrees post op; 151 degrees pre-op    Time 4    Period Weeks    Status New    Target Date 02/26/20      PT LONG TERM GOAL #4   Title Patient will improve the Quick DASH score to be </= 10 for improved overall upper extremity function.    Baseline 27.27    Time 4    Period Weeks    Status New    Target Date 02/26/20      PT LONG TERM GOAL #5   Title Patient will verbalize good understanding of lymphedema risk reduction practices after attending the ABC class.    Time 4    Period Weeks    Status New    Target Date 02/26/20                 Plan - 02/02/20 1901    Clinical Impression Statement Patient arrived doing fairly well with some reports of tightness in axillary region. [place] guided through [facility] for left shoulder with good response and minimal reports of pain. L shoulder PROM performed with smooth arcs of motion with biggest limitation in L shoulder Abduction, see objective measurements. Patient and PT discussed plan of care to which patient reported understanding.    Stability/Clinical Decision Making Stable/Uncomplicated    Clinical Decision Making Low    Rehab Potential Excellent    PT Frequency 2x / week    PT Duration 4 weeks    PT Treatment/Interventions ADLs/Self Care Home Management;Therapeutic exercise;Patient/family education;Manual techniques;Passive range of motion;Scar mobilization;Therapeutic activities    PT Next Visit Plan PROM, myofascial release to left axillary region, AAROM exercises; Avoid UBE, E-stim, ultrasound secondary to cancer/lymphedema precautions    PT Home Exercise Plan Post op shoulder ROM HEP    Consulted and Agree with Plan of Care Patient           Patient will benefit from skilled therapeutic  intervention in order to improve the following deficits  and impairments:  Postural dysfunction, Decreased range of motion, Decreased knowledge of precautions, Impaired UE functional use, Pain, Increased fascial restricitons, Decreased scar mobility  Visit Diagnosis: Malignant neoplasm of upper-outer quadrant of left breast in female, estrogen receptor positive ([facility])  Abnormal posture  Stiffness of left shoulder, not elsewhere classified  Pain in left upper arm  Aftercare following surgery for neoplasm     Problem List Patient Active Problem List   Diagnosis Date Noted  . Family history of breast cancer   . Family history of prostate cancer   . Family history of bone cancer   . Malignant neoplasm of upper-outer quadrant of left breast in female, estrogen receptor negative ([place]) 10/31/2019    Gabriela Eves, PT, DPT 02/02/2020, 7:11 PM  [facility] [place], [place], [address] Phone: [phone]   Fax:  [phone]  Name: Lindsay [name] MRN: [id number] Date of Birth: 01/15/1956

## 2020-02-03 ENCOUNTER — Other Ambulatory Visit: Payer: Self-pay | Admitting: Nurse Practitioner

## 2020-02-03 DIAGNOSIS — C50412 Malignant neoplasm of upper-outer quadrant of left female breast: Secondary | ICD-10-CM

## 2020-02-04 ENCOUNTER — Ambulatory Visit: Payer: Medicare Other | Admitting: Physical Therapy

## 2020-02-04 ENCOUNTER — Other Ambulatory Visit: Payer: Self-pay

## 2020-02-04 ENCOUNTER — Inpatient Hospital Stay: Payer: Medicare Other

## 2020-02-04 ENCOUNTER — Encounter: Payer: Self-pay | Admitting: Hematology

## 2020-02-04 ENCOUNTER — Inpatient Hospital Stay (HOSPITAL_BASED_OUTPATIENT_CLINIC_OR_DEPARTMENT_OTHER): Payer: Medicare Other | Admitting: Hematology

## 2020-02-04 VITALS — BP 138/70 | HR 77 | Temp 97.8°F | Resp 18 | Ht 61.0 in | Wt 196.1 lb

## 2020-02-04 DIAGNOSIS — Z171 Estrogen receptor negative status [ER-]: Secondary | ICD-10-CM | POA: Diagnosis not present

## 2020-02-04 DIAGNOSIS — C50412 Malignant neoplasm of upper-outer quadrant of left female breast: Secondary | ICD-10-CM

## 2020-02-04 DIAGNOSIS — Z95828 Presence of other vascular implants and grafts: Secondary | ICD-10-CM

## 2020-02-04 DIAGNOSIS — R293 Abnormal posture: Secondary | ICD-10-CM

## 2020-02-04 DIAGNOSIS — M25612 Stiffness of left shoulder, not elsewhere classified: Secondary | ICD-10-CM

## 2020-02-04 DIAGNOSIS — Z5112 Encounter for antineoplastic immunotherapy: Secondary | ICD-10-CM | POA: Diagnosis not present

## 2020-02-04 DIAGNOSIS — M79622 Pain in left upper arm: Secondary | ICD-10-CM

## 2020-02-04 DIAGNOSIS — Z483 Aftercare following surgery for neoplasm: Secondary | ICD-10-CM

## 2020-02-04 LAB — CMP (CANCER CENTER ONLY)
ALT: 22 U/L (ref 0–44)
AST: 41 U/L (ref 15–41)
Albumin: 3.7 g/dL (ref 3.5–5.0)
Alkaline Phosphatase: 79 U/L (ref 38–126)
Anion gap: 9 (ref 5–15)
BUN: 11 mg/dL (ref 8–23)
CO2: 27 mmol/L (ref 22–32)
Calcium: 9 mg/dL (ref 8.9–10.3)
Chloride: 103 mmol/L (ref 98–111)
Creatinine: 0.79 mg/dL (ref 0.44–1.00)
GFR, Est AFR Am: >60 mL/min (ref >60)
GFR, Estimated: >60 mL/min (ref >60)
Glucose, Bld: 98 mg/dL (ref 70–99)
Potassium: 4 mmol/L (ref 3.5–5.1)
Sodium: 139 mmol/L (ref 135–145)
Total Bilirubin: 0.4 mg/dL (ref 0.3–1.2)
Total Protein: 6.3 g/dL — ABNORMAL LOW (ref 6.5–8.1)

## 2020-02-04 LAB — CBC WITH DIFFERENTIAL (CANCER CENTER ONLY)
Abs Immature Granulocytes: 0.01 10*3/uL (ref 0.00–0.07)
Basophils Absolute: 0.1 10*3/uL (ref 0.0–0.1)
Basophils Relative: 1 %
Eosinophils Absolute: 0.3 10*3/uL (ref 0.0–0.5)
Eosinophils Relative: 4 %
HCT: 37 % (ref 36.0–46.0)
Hemoglobin: 12.7 g/dL (ref 12.0–15.0)
Immature Granulocytes: 0 %
Lymphocytes Relative: 35 %
Lymphs Abs: 2.6 10*3/uL (ref 0.7–4.0)
MCH: 32.7 pg (ref 26.0–34.0)
MCHC: 34.3 g/dL (ref 30.0–36.0)
MCV: 95.4 fL (ref 80.0–100.0)
Monocytes Absolute: 0.7 10*3/uL (ref 0.1–1.0)
Monocytes Relative: 9 %
Neutro Abs: 3.8 10*3/uL (ref 1.7–7.7)
Neutrophils Relative %: 51 %
Platelet Count: 162 10*3/uL (ref 150–400)
RBC: 3.88 MIL/uL (ref 3.87–5.11)
RDW: 13.1 % (ref 11.5–15.5)
WBC Count: 7.4 10*3/uL (ref 4.0–10.5)
nRBC: 0 % (ref 0.0–0.2)

## 2020-02-04 MED ORDER — LIDOCAINE-PRILOCAINE 2.5-2.5 % EX CREA: 1.0000 "application " | TOPICAL_CREAM | CUTANEOUS | 0 refills | Status: DC | PRN

## 2020-02-04 MED ORDER — SODIUM CHLORIDE 0.9% FLUSH
10.0000 mL | Freq: Once | INTRAVENOUS | Status: AC
  Administered 2020-02-04: 10 mL
  Filled 2020-02-04: qty 10

## 2020-02-04 MED ORDER — HEPARIN SOD (PORK) LOCK FLUSH 100 UNIT/ML IV SOLN
500.0000 [IU] | Freq: Once | INTRAVENOUS | Status: AC
  Administered 2020-02-04: 500 [IU]
  Filled 2020-02-04: qty 5

## 2020-02-04 NOTE — Progress Notes (Signed)
[place]   [phone] [phone]   Clinic Follow up Note   Patient Care Team: Scotty Court, DO as PCP - General Mauro Kaufmann, RN as Oncology Nurse Navigator Rockwell Germany, RN as Oncology Nurse Navigator [name], MD as Consulting Physician (General Surgery) Truitt Merle, MD as Consulting Physician (Hematology) Eppie Gibson, MD as Attending Physician (Radiation Oncology)  Date of Service:  02/04/2020  CHIEF COMPLAINT: F/u of left breast metaplastic cancer, triple negative   SUMMARY OF ONCOLOGIC HISTORY: Oncology History Overview Note  Cancer Staging Malignant neoplasm of upper-outer quadrant of left breast in female, estrogen receptor negative ([place]) Staging form: Breast, AJCC 8th Edition - Clinical stage from 10/28/2019: Stage IIIB (cT2, cN1, cM0, G3, ER-, PR-, HER2-) - Signed by Eppie Gibson, MD on 11/05/2019 - Pathologic stage from 12/26/2019: Stage IIIC (pT3, pN3a, cM0, G3, ER-, PR-, HER2-) - Signed by Truitt Merle, MD on 01/28/2020    Malignant neoplasm of upper-outer quadrant of left breast in female, estrogen receptor negative ([place])  10/27/2019 Mammogram   IMPRESSION: 1. Right breast calcifications spanning 2.6 cm in the upper slightly medial breast are indeterminate.   2. Left breast dominant mass at 3 o'clock, 6 cm from nipple measuring 3x3.9x2.9 cm is highly suspicious. There is overlying skin thickening, skin involvement is not excluded.   3. Left breast mass/distorted tissue at 1 o'clock, 4cm from nipple measuring 3.0x1.2x2.9 cm is likely in contiguity with the dominant mass at 3 o'clock and is also highly suspicious. With the dominant lesion the overall span a disease is approximately 6 cm.   4. Left breast distortion at 2 o'clock 10 cm from the nipple is Suspicious, measuring 1.0 x 0.8 cm.   5.  Abnormal left axillary lymph nodes (2). Cortical thickness measures up to 0.6 cm.    10/28/2019 Initial Biopsy    Diagnosis 1. Breast, left, needle core biopsy, 3 o'clock, 5cmfn - INVASIVE MAMMARY CARCINOMA. 2. Breast, left, needle core biopsy, 2 o'clock, 10cmfn - INVASIVE MAMMARY CARCINOMA. 3. 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 Microscopic Comment 1. -3. Overall, immunohistochemistry favors a pronounced desmoplastic response, but metaplastic carcinoma cannot be excluded (Epithelioid component: CKAE1AE3 strong +, CK5/6 weak +). The greatest linear extent of tumor in any one core in specimen 1 is 14 mm. The greatest linear extent of tumor in any one core in specimen 2 is 16 mm.   10/28/2019 Receptors her2   3. PROGNOSTIC INDICATORS Results: IMMUNOHISTOCHEMICAL AND MORPHOMETRIC ANALYSIS PERFORMED MANUALLY The tumor cells are EQUIVOCAL for Her2 (2+). Her2 by FISH will be performed and results reported separately. Estrogen Receptor: 0%, NEGATIVE Progesterone Receptor: 0%, NEGATIVE Proliferation Marker Ki67: 10%    3. FLUORESCENCE IN-SITU HYBRIDIZATION Results: GROUP 5: HER2 **NEGATIVE** Equivocal form of amplification of the HER2 gene was detected in the IHC 2+ tissue sample received from this individual. HER2 FISH was performed by a technologist and cell imaging and analysis on the [employer].   10/28/2019 Cancer Staging   Staging form: Breast, AJCC 8th Edition - Clinical stage from 10/28/2019: Stage IIIB (cT2, cN1, cM0, G3, ER-, PR-, HER2-) - Signed by Eppie Gibson, MD on 11/05/2019   10/31/2019 Initial Diagnosis   Malignant neoplasm of upper-outer quadrant of left breast in female, estrogen receptor negative ([place])   10/31/2019 Pathology Results   Diagnosis Breast, right, needle core biopsy, UOQ, posterior - FOCAL USUAL DUCTAL HYPERPLASIA AND FIBROCYSTIC CHANGES WITH CALCIFICATIONS - FIBROADENOMATOID  CHANGES - NO MALIGNANCY IDENTIFIED Microscopic Comment These results were called to [facility] on November 03, 2019.     11/05/2019 Genetic Testing   She declined Genetic testing    11/21/2019 Imaging   CT CAP w contrast  IMPRESSION: 1. Left breast mass and prominent left axillary lymph nodes, containing biopsy marking clips, consistent with newly diagnosed breast malignancy. 2. No definite evidence of distant metastatic disease in the chest, abdomen, or pelvis. 3. There are occasional small pulmonary nodules, measuring 2-3 mm, most likely incidental sequelae of prior infection or inflammation although metastatic disease is not excluded. Attention on follow-up. 4. Hepatic steatosis. 5. Aortic Atherosclerosis (ICD10-I70.0).     11/21/2019 Imaging   Bone Scan Whole Body IMPRESSION: 1. Single focus of uptake associated with a subacute fracture of LEFT anterior fourth rib. 2. No additional areas of abnormal uptake.   12/26/2019 Cancer Staging   Staging form: Breast, AJCC 8th Edition - Pathologic stage from 12/26/2019: Stage IIIC (pT3, pN3a, cM0, G3, ER-, PR-, HER2-) - Signed by Truitt Merle, MD on 01/28/2020   12/26/2019 Surgery   LEFT MASTECTOMY MODIFIED RADICAL and PAC Placement by Dr Marlou Starks   12/26/2019 Pathology Results   FINAL MICROSCOPIC DIAGNOSIS:   A. 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  - Ductal carcinoma in situ, high nuclear grade with central necrosis.  - Margins of resection:  - Metaplastic carcinoma focally involves the anterior margin and is < 1  mm from the posterior margin.  - DCIS is < 1 mm from the anterior margin.  - Metastatic carcinoma in (13) of (16) lymph nodes with extranodal  extension.  - Biopsy clip sites in breast and one lymph node.  - See oncology table.    ADDENDUM:  PROGNOSTIC INDICATOR RESULTS:  Immunohistochemical and morphometric analysis performed manually  The tumor cells are EQUIVOCAL for Her2 (2+). Her2 FISH has been ordered  and will be reported in an  addendum.  Estrogen Receptor:       NEGATIVE  Progesterone Receptor:   NEGATIVE  Proliferation Marker Ki-67:   30%    ADDENDUM:  FLOURESCENCE IN-SITU HYBRIDIZATION RESULTS:  GROUP 5:   HER2 NEGATIVE   01/28/2020 Echocardiogram   Baseline Echo  IMPRESSIONS     1. Left ventricular ejection fraction, by estimation, is 60 to 65%. The  left ventricle has normal function. The left ventricle has no regional  wall motion abnormalities. Left ventricular diastolic parameters are  consistent with Grade I diastolic  dysfunction (impaired relaxation). The average left ventricular global  longitudinal strain is -19.8 %.   2. Right ventricular systolic function is normal. The right ventricular  size is normal. Tricuspid regurgitation signal is inadequate for assessing  PA pressure.   3. The mitral valve is normal in structure. No evidence of mitral valve  regurgitation. No evidence of mitral stenosis.   4. The aortic valve is normal in structure. Aortic valve regurgitation is  not visualized. No aortic stenosis is present.   5. The inferior vena cava is normal in size with greater than 50%  respiratory variability, suggesting right atrial pressure of 3 mmHg.    02/05/2020 -  Chemotherapy   Keytruda q3weeks (to be taken for 1 whole year) with weekly Carboplatin/Taxol for 12 weeks starting 02/05/20 followed by Adriamycin/Cytoxan q2weeeks      CURRENT THERAPY:  Ballard Russell (to be taken for 1 whole year)  with weekly Carboplatin/Taxol for 12 weeks starting 02/05/20 followed by Adriamycin/Cytoxan q2weeks  INTERVAL HISTORY:  Kirt Boys is here for a follow up. She presents to the clinic alone. She reviewed her questions about start of treatment tomorrow. She is overall ready to start chemo tomorrow. She notes neuropathy in her feet from prior shingles flare and her job.    REVIEW OF SYSTEMS:   Constitutional: Denies fevers, chills or abnormal weight loss Eyes: Denies blurriness of  vision Ears, nose, mouth, throat, and face: Denies mucositis or sore throat Respiratory: Denies cough, dyspnea or wheezes Cardiovascular: Denies palpitation, chest discomfort or lower extremity swelling Gastrointestinal:  Denies nausea, heartburn or change in bowel habits Skin: Denies abnormal skin rashes Lymphatics: Denies new lymphadenopathy or easy bruising Neurological: (+) Baseline neuropathy in feet  Behavioral/Psych: Mood is stable, no new changes  All other systems were reviewed with the patient and are negative.  MEDICAL HISTORY:  Past Medical History:  Diagnosis Date  . Bronchitis   . Family history of bone cancer   . Family history of breast cancer   . Family history of prostate cancer   . Fibromyalgia   . HCV (hepatitis C virus)   . MRSA (methicillin resistant Staphylococcus aureus) 2012    SURGICAL HISTORY: Past Surgical History:  Procedure Laterality Date  . MASTECTOMY MODIFIED RADICAL Left 12/26/2019   Procedure: LEFT MASTECTOMY MODIFIED RADICAL;  Surgeon: [name], MD;  Location: [facility];  Service: General;  Laterality: Left;  PEC BLOCK  . PORTACATH PLACEMENT Right 12/26/2019   Procedure: INSERTION PORT-A-CATH WITH ULTRASOUND GUIDANCE;  Surgeon: [name], MD;  Location: [facility];  Service: General;  Laterality: Right;    I have reviewed the social history and family history with the patient and they are unchanged from previous note.  ALLERGIES:  is allergic to codeine.  MEDICATIONS:  Current Outpatient Medications  Medication Sig Dispense Refill  . acetaminophen (TYLENOL) 650 MG CR tablet Take 650 mg by mouth every 8 (eight) hours as needed for pain.     Marland Kitchen amoxicillin-clavulanate (AUGMENTIN) 875-125 MG tablet Take 1 tablet by mouth 2 (two) times daily. 14 tablet 0  . Ascorbic Acid (VITAMIN C PO) Take 1 tablet by mouth daily.    Marland Kitchen dexamethasone (DECADRON) 4 MG tablet Take 2 tablets (8 mg total) by mouth daily. Start the day after carboplatin chemotherapy  for 3 days. 30 tablet 1  . lidocaine-prilocaine (EMLA) cream Apply 1 application topically as needed. 30 g 0  . methocarbamol (ROBAXIN) 750 MG tablet Take 1 tablet (750 mg total) by mouth 4 (four) times daily as needed (use for muscle cramps/pain). 30 tablet 2  . ondansetron (ZOFRAN) 8 MG tablet Take 1 tablet (8 mg total) by mouth 2 (two) times daily as needed for refractory nausea / vomiting. Start on day 3 after carboplatin chemo. 30 tablet 1  . oxyCODONE (OXY IR/ROXICODONE) 5 MG immediate release tablet Take 1 tablet (5 mg total) by mouth every 6 (six) hours as needed (for pain score of 1-4). 20 tablet 0  . oxyCODONE (OXY IR/ROXICODONE) 5 MG immediate release tablet Take 1-2 tablets (5-10 mg total) by mouth every 6 (six) hours as needed for moderate pain, severe pain or breakthrough pain. 20 tablet 0  . prochlorperazine (COMPAZINE) 10 MG tablet Take 1 tablet (10 mg total) by mouth every 6 (six) hours as needed (Nausea or vomiting). 30 tablet 1   No current facility-administered medications for this visit.  PHYSICAL EXAMINATION: ECOG PERFORMANCE STATUS: 0 - Asymptomatic  Vitals:   02/04/20 1605  BP: 138/70  Pulse: 77  Resp: 18  Temp: 97.8 F (36.6 C)  SpO2: 97%   Filed Weights   02/04/20 1605  Weight: 196 lb 1.6 oz (89 kg)    Due to COVID19 we will limit examination to appearance. Patient had no complaints.  GENERAL:alert, no distress and comfortable SKIN: skin color normal, no rashes or significant lesions EYES: normal, Conjunctiva are pink and non-injected, sclera clear  NEURO: alert & oriented x 3 with fluent speech    LABORATORY DATA:  I have reviewed the data as listed CBC Latest Ref Rng & Units 02/04/2020 12/23/2019 11/05/2019  WBC 4.0 - 10.5 K/uL 7.4 7.0 5.1  Hemoglobin 12.0 - 15.0 g/dL 12.7 14.6 13.6  Hematocrit 36 - 46 % 37.0 45.1 40.7  Platelets 150 - 400 K/uL 162 168 152     CMP Latest Ref Rng & Units 02/04/2020 12/23/2019 11/05/2019  Glucose 70 - 99 mg/dL 98  117(H) 106(H)  BUN 8 - 23 mg/dL '11 10 13  ' Creatinine 0.44 - 1.00 mg/dL 0.79 0.69 0.79  Sodium 135 - 145 mmol/L 139 141 143  Potassium 3.5 - 5.1 mmol/L 4.0 5.3(H) 4.6  Chloride 98 - 111 mmol/L 103 105 108  CO2 22 - 32 mmol/L '27 29 27  ' Calcium 8.9 - 10.3 mg/dL 9.0 9.5 9.3  Total Protein 6.5 - 8.1 g/dL 6.3(L) - 6.6  Total Bilirubin 0.3 - 1.2 mg/dL 0.4 - 0.6  Alkaline Phos 38 - 126 U/L 79 - 71  AST 15 - 41 U/L 41 - 33  ALT 0 - 44 U/L 22 - 17      RADIOGRAPHIC STUDIES: I have personally reviewed the radiological images as listed and agreed with the findings in the report. No results found.   ASSESSMENT & PLAN:  Neena Beecham is a 64 y.o. female with    1. 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 Her 11/2019 CT CAP and bone scan was negative for distant metastasis.  -She underwent left mastectomy with Dr Marlou Starks on 12/26/19. Surgical path showed 6cm multifocal metaplastic carcinoma and 13/16 positive LN but unfortunately 1 positive LN was not able to be removed given it invasion of a vessel.  -Due to the residual axillary lymph node, adjuvant radiation including axillary coverage, is critical, and I am not sure if adjuvant therapy including chemo and radiation can cure the residual disease. She has a very high risk of cancer progression and distant recurrence.  -I discussed metaplastic carcinoma is a rare type breast cancer and adjuvant therapy data is limited. She has been seen by [facility]'s Dr Annabell Sabal who recommended adjuvant chemo with Ballard Russell with weekly Carboplatin/Taxol for 12 weeks followed by Adriamycin/Cytoxan q2weeks based on the [place]. Will continue Keytruda q3weeks to complete 1 year of treatment. She agreed and gave chemo consent.  -The goal of  therapy is curative -I discussed option of neuropathy trial given Taxol treatment. She notes having baseline mild neuropathy in her feet. She is interested. I also recommend she use cryotherapy during chemo infusions to reduce her risk of neuropathy exacerbation. -Labs reviewed and adequate to proceed with start of chemo tomorrow. Given reaction to benadryl will use Claritin in pre-meds.  -we discussed using GCSF if need for  neutropenia during chemo  -F/u in 1 and 3 weeks. I encouraged her to contact clinic for any significant or unexpected side effects.    2. Hepatitis C,untreated,diagnosed in 1992 -I will refer her to [facility] or [facility] for treatment after she completed chemo.  -She also notes a history of MRSA which she is not sure has completed cleared. -We will watch her liver functions closely during the chemotherapy.  We discussed her that she may have hepatitis C flare when she has chemo    3. Social and financial support  -She has no children and no close relatives. She does have close friends who are supportive.  -She did not have medical insurance for many years. She recently applied for blue cross and blue shield which is not effective yet.   PLAN: -I called in EMLA cream today  -Labs reviewed and adequate to proceed with start of Keytruda, Carboplatin/Taxol tomorrow -Lab, flush, CT in 1, 2, 3 weeks  -Keytruda in 3 weeks -F/u in 1, 3 weeks.    No problem-specific Assessment & Plan notes found for this encounter.   No orders of the defined types were placed in this encounter.  All questions were answered. The patient knows to call the clinic with any problems, questions or concerns. No barriers to learning was detected. The total time spent in the appointment was 30 minutes.     Truitt Merle, MD 02/04/2020   I, Joslyn Devon, am acting as scribe for Truitt Merle, MD.   I have reviewed the above documentation for accuracy and completeness, and I agree with the  above.

## 2020-02-04 NOTE — Therapy (Signed)
[facility] [place], [place], [address] Phone: [phone]   Fax:  [phone]  Physical Therapy Treatment  Patient Details  Name: Lindsay [name] MRN: [id number] Date of Birth: 1955/07/29 Referring Provider (PT): Dr. Autumn Messing   Encounter Date: 02/04/2020   PT End of Session - 02/04/20 1013    Visit Number 4    Number of Visits 10    Date for PT Re-Evaluation 02/26/20    PT Start Time 0945    PT Stop Time 1031    PT Time Calculation (min) 46 min    Activity Tolerance Patient tolerated treatment well    Behavior During Therapy [facility] for tasks assessed/performed           Past Medical History:  Diagnosis Date  . Bronchitis   . Family history of bone cancer   . Family history of breast cancer   . Family history of prostate cancer   . Fibromyalgia   . HCV (hepatitis C virus)   . MRSA (methicillin resistant Staphylococcus aureus) 2012    Past Surgical History:  Procedure Laterality Date  . MASTECTOMY MODIFIED RADICAL Left 12/26/2019   Procedure: LEFT MASTECTOMY MODIFIED RADICAL;  Surgeon: [name], MD;  Location: [facility];  Service: General;  Laterality: Left;  PEC BLOCK  . PORTACATH PLACEMENT Right 12/26/2019   Procedure: INSERTION PORT-A-CATH WITH ULTRASOUND GUIDANCE;  Surgeon: [name], MD;  Location: [facility];  Service: General;  Laterality: Right;    There were no vitals filed for this visit.   Subjective Assessment - 02/04/20 0951    Subjective COVID-19 screening performed upon arrival. Patient arrives doing well. States she did well after last session.    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 Patient underwent a left modified radical mastectomy on 12/26/2019 with 13 of 16 were positive for cancer. The Ki67 is 10%.    Patient Stated Goals Get my arm better              [facility] PT Assessment - 02/04/20 0001       Assessment   Medical Diagnosis s/p left MRM    Referring Provider (PT) Dr. Autumn Messing    Onset Date/Surgical Date 12/26/19    Hand Dominance Right    Next MD Visit "next week"    Prior Therapy Baselines      Precautions   Precautions Other (comment)    Precaution Comments active cancer,       ROM / Strength   AROM / PROM / Strength AROM      AROM   Left Shoulder Flexion 152 Degrees                         [facility] Adult PT Treatment/Exercise - 02/04/20 0001      Exercises   Exercises Shoulder      Shoulder Exercises: Standing   Extension AAROM;Both;20 reps    Extension Limitations with wand    Other Standing Exercises AAROM behind the back IR  x20      Shoulder Exercises: Pulleys   Flexion 5 minutes      Shoulder Exercises: ROM/Strengthening   Ranger standing ranger flexion, CW, CCW circles x3 mins each      Manual Therapy   Manual Therapy Passive ROM;Myofascial release    Myofascial Release Myofasical release to scar, low load long duration stretch to improve ABD and  ER    Passive ROM PROM to left shoulder into flexion, ER, IR, and abduction with gentle holds to improve ROM                       PT Long Term Goals - 01/29/20 1638      PT LONG TERM GOAL #1   Title Patient will demonstrate she has regained full shoulder ROM and function post operatively compared to baselines.    Time 4    Period Weeks    Status On-going    Target Date 02/26/20      PT LONG TERM GOAL #2   Title Patient will increase left shoulder active flexion to >/= 130 degrees for increased ease reacing overhead.    Baseline 98 degrees post op; 146 pre-op    Time 4    Period Weeks    Status New    Target Date 02/26/20      PT LONG TERM GOAL #3   Title Patient will increase left shoulder active abduction to >/= 130 degrees for ability to obtain radiation positioning.    Baseline 110 degrees post op; 151 degrees pre-op    Time 4    Period Weeks    Status New     Target Date 02/26/20      PT LONG TERM GOAL #4   Title Patient will improve the Quick DASH score to be </= 10 for improved overall upper extremity function.    Baseline 27.27    Time 4    Period Weeks    Status New    Target Date 02/26/20      PT LONG TERM GOAL #5   Title Patient will verbalize good understanding of lymphedema risk reduction practices after attending the ABC class.    Time 4    Period Weeks    Status New    Target Date 02/26/20                 Plan - 02/04/20 1011    Clinical Impression Statement Patient responded well to therapy session with the addition of standing AAROM TEs. Patient provided intermittent verbal and tactile cuing for proper technique with improvements for remaining reps. STW/M and scar massaging performed to improve scar mobility with good response.    Stability/Clinical Decision Making Stable/Uncomplicated    Clinical Decision Making Low    Rehab Potential Excellent    PT Frequency 2x / week    PT Duration 4 weeks    PT Treatment/Interventions ADLs/Self Care Home Management;Therapeutic exercise;Patient/family education;Manual techniques;Passive range of motion;Scar mobilization;Therapeutic activities    PT Next Visit Plan PROM, myofascial release to left axillary region, AAROM exercises; Avoid UBE, E-stim, ultrasound secondary to cancer/lymphedema precautions    PT Home Exercise Plan Post op shoulder ROM HEP    Consulted and Agree with Plan of Care Patient           Patient will benefit from skilled therapeutic intervention in order to improve the following deficits and impairments:  Postural dysfunction, Decreased range of motion, Decreased knowledge of precautions, Impaired UE functional use, Pain, Increased fascial restricitons, Decreased scar mobility  Visit Diagnosis: Malignant neoplasm of upper-outer quadrant of left breast in female, estrogen receptor positive ([facility])  Abnormal posture  Stiffness of left shoulder, not  elsewhere classified  Pain in left upper arm  Aftercare following surgery for neoplasm     Problem List Patient Active Problem List   Diagnosis Date Noted  .  Family history of breast cancer   . Family history of prostate cancer   . Family history of bone cancer   . Malignant neoplasm of upper-outer quadrant of left breast in female, estrogen receptor negative ([place]) 10/31/2019    Gabriela Eves, PT, DPT 02/04/2020, 10:48 AM  [facility] [place], [place], [address] Phone: [phone]   Fax:  [phone]  Name: Lindsay [name] MRN: [id number] Date of Birth: 08-21-1955

## 2020-02-05 ENCOUNTER — Other Ambulatory Visit: Payer: Medicare Other

## 2020-02-05 ENCOUNTER — Inpatient Hospital Stay: Payer: Medicare Other

## 2020-02-05 ENCOUNTER — Other Ambulatory Visit: Payer: Self-pay

## 2020-02-05 ENCOUNTER — Encounter: Payer: Self-pay | Admitting: Hematology

## 2020-02-05 ENCOUNTER — Ambulatory Visit: Payer: Medicare Other | Admitting: Hematology

## 2020-02-05 VITALS — BP 144/65 | HR 63 | Temp 97.8°F | Resp 16

## 2020-02-05 DIAGNOSIS — Z5112 Encounter for antineoplastic immunotherapy: Secondary | ICD-10-CM | POA: Diagnosis not present

## 2020-02-05 DIAGNOSIS — C50412 Malignant neoplasm of upper-outer quadrant of left female breast: Secondary | ICD-10-CM

## 2020-02-05 LAB — TSH: TSH: 0.959 u[IU]/mL (ref 0.308–3.960)

## 2020-02-05 MED ORDER — SODIUM CHLORIDE 0.9% FLUSH
10.0000 mL | INTRAVENOUS | Status: DC | PRN
  Administered 2020-02-05: 10 mL
  Filled 2020-02-05: qty 10

## 2020-02-05 MED ORDER — SODIUM CHLORIDE 0.9 % IV SOLN
10.0000 mg | Freq: Once | INTRAVENOUS | Status: AC
  Administered 2020-02-05: 10 mg via INTRAVENOUS
  Filled 2020-02-05: qty 10

## 2020-02-05 MED ORDER — SODIUM CHLORIDE 0.9 % IV SOLN
80.0000 mg/m2 | Freq: Once | INTRAVENOUS | Status: AC
  Administered 2020-02-05: 156 mg via INTRAVENOUS
  Filled 2020-02-05: qty 26

## 2020-02-05 MED ORDER — SODIUM CHLORIDE 0.9 % IV SOLN
Freq: Once | INTRAVENOUS | Status: AC
  Filled 2020-02-05: qty 250

## 2020-02-05 MED ORDER — LORATADINE 10 MG PO TABS
ORAL_TABLET | ORAL | Status: AC
  Filled 2020-02-05: qty 1

## 2020-02-05 MED ORDER — PALONOSETRON HCL INJECTION 0.25 MG/5ML
0.2500 mg | Freq: Once | INTRAVENOUS | Status: AC
  Administered 2020-02-05: 0.25 mg via INTRAVENOUS

## 2020-02-05 MED ORDER — PALONOSETRON HCL INJECTION 0.25 MG/5ML
INTRAVENOUS | Status: AC
  Filled 2020-02-05: qty 5

## 2020-02-05 MED ORDER — SODIUM CHLORIDE 0.9 % IV SOLN
200.0000 mg | Freq: Once | INTRAVENOUS | Status: AC
  Administered 2020-02-05: 200 mg via INTRAVENOUS
  Filled 2020-02-05: qty 8

## 2020-02-05 MED ORDER — LORATADINE 10 MG PO TABS
10.0000 mg | ORAL_TABLET | Freq: Every day | ORAL | Status: DC
  Administered 2020-02-05: 10 mg via ORAL

## 2020-02-05 MED ORDER — FAMOTIDINE IN NACL 20-0.9 MG/50ML-% IV SOLN
20.0000 mg | Freq: Once | INTRAVENOUS | Status: AC
  Administered 2020-02-05: 20 mg via INTRAVENOUS

## 2020-02-05 MED ORDER — SODIUM CHLORIDE 0.9 % IV SOLN
152.5500 mg | Freq: Once | INTRAVENOUS | Status: AC
  Administered 2020-02-05: 150 mg via INTRAVENOUS
  Filled 2020-02-05: qty 15

## 2020-02-05 MED ORDER — HEPARIN SOD (PORK) LOCK FLUSH 100 UNIT/ML IV SOLN
500.0000 [IU] | Freq: Once | INTRAVENOUS | Status: AC | PRN
  Administered 2020-02-05: 500 [IU]
  Filled 2020-02-05: qty 5

## 2020-02-05 MED ORDER — FAMOTIDINE IN NACL 20-0.9 MG/50ML-% IV SOLN
INTRAVENOUS | Status: AC
  Filled 2020-02-05: qty 50

## 2020-02-05 NOTE — Patient Instructions (Signed)
[facility] Discharge Instructions for Patients Receiving Chemotherapy  Today you received the following chemotherapy agents pembrolizumab, paclitaxel, carboplatin  To help prevent nausea and vomiting after your treatment, we encourage you to take your nausea medication as directed.    If you develop nausea and vomiting that is not controlled by your nausea medication, call the clinic.   BELOW ARE SYMPTOMS THAT SHOULD BE REPORTED IMMEDIATELY:  *FEVER GREATER THAN 100.5 F  *CHILLS WITH OR WITHOUT FEVER  NAUSEA AND VOMITING THAT IS NOT CONTROLLED WITH YOUR NAUSEA MEDICATION  *UNUSUAL SHORTNESS OF BREATH  *UNUSUAL BRUISING OR BLEEDING  TENDERNESS IN MOUTH AND THROAT WITH OR WITHOUT PRESENCE OF ULCERS  *URINARY PROBLEMS  *BOWEL PROBLEMS  UNUSUAL RASH Items with * indicate a potential emergency and should be followed up as soon as possible.  Feel free to call the clinic should you have any questions or concerns. The clinic phone number is [phone].  Please show the [facility] at check-in to the Emergency Department and triage nurse.  Pembrolizumab injection What is this medicine? PEMBROLIZUMAB (pem broe liz ue mab) is a monoclonal antibody. It is used to treat certain types of cancer. This medicine may be used for other purposes; ask your health care provider or pharmacist if you have questions. COMMON BRAND NAME(S): Keytruda What should I tell my health care provider before I take this medicine? They need to know if you have any of these conditions:  diabetes  immune system problems  inflammatory bowel disease  liver disease  lung or breathing disease  lupus  received or scheduled to receive an organ transplant or a stem-cell transplant that uses donor stem cells  an unusual or allergic reaction to pembrolizumab, other medicines, foods, dyes, or preservatives  pregnant or trying to get pregnant  breast-feeding How should I use this  medicine? This medicine is for infusion into a vein. It is given by a health care professional in a hospital or clinic setting. A special MedGuide will be given to you before each treatment. Be sure to read this information carefully each time. Talk to your pediatrician regarding the use of this medicine in children. While this drug may be prescribed for children as young as 6 months for selected conditions, precautions do apply. Overdosage: If you think you have taken too much of this medicine contact a poison control center or emergency room at once. NOTE: This medicine is only for you. Do not share this medicine with others. What if I miss a dose? It is important not to miss your dose. Call your doctor or health care professional if you are unable to keep an appointment. What may interact with this medicine? Interactions have not been studied. Give your health care provider a list of all the medicines, herbs, non-prescription drugs, or dietary supplements you use. Also tell them if you smoke, drink alcohol, or use illegal drugs. Some items may interact with your medicine. This list may not describe all possible interactions. Give your health care provider a list of all the medicines, herbs, non-prescription drugs, or dietary supplements you use. Also tell them if you smoke, drink alcohol, or use illegal drugs. Some items may interact with your medicine. What should I watch for while using this medicine? Your condition will be monitored carefully while you are receiving this medicine. You may need blood work done while you are taking this medicine. Do not become pregnant while taking this medicine or for 4 months after stopping  it. Women should inform their doctor if they wish to become pregnant or think they might be pregnant. There is a potential for serious side effects to an unborn child. Talk to your health care professional or pharmacist for more information. Do not breast-feed an infant while  taking this medicine or for 4 months after the last dose. What side effects may I notice from receiving this medicine? Side effects that you should report to your doctor or health care professional as soon as possible:  allergic reactions like skin rash, itching or hives, swelling of the face, lips, or tongue  bloody or black, tarry  breathing problems  changes in vision  chest pain  chills  confusion  constipation  cough  diarrhea  dizziness or feeling faint or lightheaded  fast or irregular heartbeat  fever  flushing  joint pain  low blood counts - this medicine may decrease the number of white blood cells, red blood cells and platelets. You may be at increased risk for infections and bleeding.  muscle pain  muscle weakness  pain, tingling, numbness in the hands or feet  persistent headache  redness, blistering, peeling or loosening of the skin, including inside the mouth  signs and symptoms of high blood sugar such as dizziness; dry mouth; dry skin; fruity breath; nausea; stomach pain; increased hunger or thirst; increased urination  signs and symptoms of kidney injury like trouble passing urine or change in the amount of urine  signs and symptoms of liver injury like dark urine, light-colored stools, loss of appetite, nausea, right upper belly pain, yellowing of the eyes or skin  sweating  swollen lymph nodes  weight loss Side effects that usually do not require medical attention (report to your doctor or health care professional if they continue or are bothersome):  decreased appetite  hair loss  muscle pain  tiredness This list may not describe all possible side effects. Call your doctor for medical advice about side effects. You may report side effects to FDA at 1-800-FDA-1088. Where should I keep my medicine? This drug is given in a hospital or clinic and will not be stored at home. NOTE: This sheet is a summary. It may not cover all  possible information. If you have questions about this medicine, talk to your doctor, pharmacist, or health care provider.  2020 Elsevier/Gold Standard (2019-02-28 18:07:58)   Paclitaxel injection What is this medicine? PACLITAXEL (PAK li TAX el) is a chemotherapy drug. It targets fast dividing cells, like cancer cells, and causes these cells to die. This medicine is used to treat ovarian cancer, breast cancer, lung cancer, Kaposi's sarcoma, and other cancers. This medicine may be used for other purposes; ask your health care provider or pharmacist if you have questions. COMMON BRAND NAME(S): Onxol, Taxol What should I tell my health care provider before I take this medicine? They need to know if you have any of these conditions:  history of irregular heartbeat  liver disease  low blood counts, like low white cell, platelet, or red cell counts  lung or breathing disease, like asthma  tingling of the fingers or toes, or other nerve disorder  an unusual or allergic reaction to paclitaxel, alcohol, polyoxyethylated castor oil, other chemotherapy, other medicines, foods, dyes, or preservatives  pregnant or trying to get pregnant  breast-feeding How should I use this medicine? This drug is given as an infusion into a vein. It is administered in a hospital or clinic by a specially trained health care professional.  Talk to your pediatrician regarding the use of this medicine in children. Special care may be needed. Overdosage: If you think you have taken too much of this medicine contact a poison control center or emergency room at once. NOTE: This medicine is only for you. Do not share this medicine with others. What if I miss a dose? It is important not to miss your dose. Call your doctor or health care professional if you are unable to keep an appointment. What may interact with this medicine? Do not take this medicine with any of the following  medications:  disulfiram  metronidazole This medicine may also interact with the following medications:  antiviral medicines for hepatitis, HIV or AIDS  certain antibiotics like erythromycin and clarithromycin  certain medicines for fungal infections like ketoconazole and itraconazole  certain medicines for seizures like carbamazepine, phenobarbital, phenytoin  gemfibrozil  nefazodone  rifampin  St. John's wort This list may not describe all possible interactions. Give your health care provider a list of all the medicines, herbs, non-prescription drugs, or dietary supplements you use. Also tell them if you smoke, drink alcohol, or use illegal drugs. Some items may interact with your medicine. What should I watch for while using this medicine? Your condition will be monitored carefully while you are receiving this medicine. You will need important blood work done while you are taking this medicine. This medicine can cause serious allergic reactions. To reduce your risk you will need to take other medicine(s) before treatment with this medicine. If you experience allergic reactions like skin rash, itching or hives, swelling of the face, lips, or tongue, tell your doctor or health care professional right away. In some cases, you may be given additional medicines to help with side effects. Follow all directions for their use. This drug may make you feel generally unwell. This is not uncommon, as chemotherapy can affect healthy cells as well as cancer cells. Report any side effects. Continue your course of treatment even though you feel ill unless your doctor tells you to stop. Call your doctor or health care professional for advice if you get a fever, chills or sore throat, or other symptoms of a cold or flu. Do not treat yourself. This drug decreases your body's ability to fight infections. Try to avoid being around people who are sick. This medicine may increase your risk to bruise or  bleed. Call your doctor or health care professional if you notice any unusual bleeding. Be careful brushing and flossing your teeth or using a toothpick because you may get an infection or bleed more easily. If you have any dental work done, tell your dentist you are receiving this medicine. Avoid taking products that contain aspirin, acetaminophen, ibuprofen, naproxen, or ketoprofen unless instructed by your doctor. These medicines may hide a fever. Do not become pregnant while taking this medicine. Women should inform their doctor if they wish to become pregnant or think they might be pregnant. There is a potential for serious side effects to an unborn child. Talk to your health care professional or pharmacist for more information. Do not breast-feed an infant while taking this medicine. Men are advised not to father a child while receiving this medicine. This product may contain alcohol. Ask your pharmacist or healthcare provider if this medicine contains alcohol. Be sure to tell all healthcare providers you are taking this medicine. Certain medicines, like metronidazole and disulfiram, can cause an unpleasant reaction when taken with alcohol. The reaction includes flushing, headache, nausea, vomiting,  sweating, and increased thirst. The reaction can last from 30 minutes to several hours. What side effects may I notice from receiving this medicine? Side effects that you should report to your doctor or health care professional as soon as possible:  allergic reactions like skin rash, itching or hives, swelling of the face, lips, or tongue  breathing problems  changes in vision  fast, irregular heartbeat  high or low blood pressure  mouth sores  pain, tingling, numbness in the hands or feet  signs of decreased platelets or bleeding - bruising, pinpoint red spots on the skin, black, tarry stools, blood in the urine  signs of decreased red blood cells - unusually weak or tired, feeling faint  or lightheaded, falls  signs of infection - fever or chills, cough, sore throat, pain or difficulty passing urine  signs and symptoms of liver injury like dark yellow or brown urine; general ill feeling or flu-like symptoms; light-colored stools; loss of appetite; nausea; right upper belly pain; unusually weak or tired; yellowing of the eyes or skin  swelling of the ankles, feet, hands  unusually slow heartbeat Side effects that usually do not require medical attention (report to your doctor or health care professional if they continue or are bothersome):  diarrhea  hair loss  loss of appetite  muscle or joint pain  nausea, vomiting  pain, redness, or irritation at site where injected  tiredness This list may not describe all possible side effects. Call your doctor for medical advice about side effects. You may report side effects to FDA at 1-800-FDA-1088. Where should I keep my medicine? This drug is given in a hospital or clinic and will not be stored at home. NOTE: This sheet is a summary. It may not cover all possible information. If you have questions about this medicine, talk to your doctor, pharmacist, or health care provider.  2020 Elsevier/Gold Standard (2016-12-26 13:14:55)  Carboplatin injection What is this medicine? CARBOPLATIN (KAR boe pla tin) is a chemotherapy drug. It targets fast dividing cells, like cancer cells, and causes these cells to die. This medicine is used to treat ovarian cancer and many other cancers. This medicine may be used for other purposes; ask your health care provider or pharmacist if you have questions. COMMON BRAND NAME(S): Paraplatin What should I tell my health care provider before I take this medicine? They need to know if you have any of these conditions:  blood disorders  hearing problems  kidney disease  recent or ongoing radiation therapy  an unusual or allergic reaction to carboplatin, cisplatin, other chemotherapy, other  medicines, foods, dyes, or preservatives  pregnant or trying to get pregnant  breast-feeding How should I use this medicine? This drug is usually given as an infusion into a vein. It is administered in a hospital or clinic by a specially trained health care professional. Talk to your pediatrician regarding the use of this medicine in children. Special care may be needed. Overdosage: If you think you have taken too much of this medicine contact a poison control center or emergency room at once. NOTE: This medicine is only for you. Do not share this medicine with others. What if I miss a dose? It is important not to miss a dose. Call your doctor or health care professional if you are unable to keep an appointment. What may interact with this medicine?  medicines for seizures  medicines to increase blood counts like filgrastim, pegfilgrastim, sargramostim  some antibiotics like amikacin, gentamicin, neomycin, streptomycin,  tobramycin  vaccines Talk to your doctor or health care professional before taking any of these medicines:  acetaminophen  aspirin  ibuprofen  ketoprofen  naproxen This list may not describe all possible interactions. Give your health care provider a list of all the medicines, herbs, non-prescription drugs, or dietary supplements you use. Also tell them if you smoke, drink alcohol, or use illegal drugs. Some items may interact with your medicine. What should I watch for while using this medicine? Your condition will be monitored carefully while you are receiving this medicine. You will need important blood work done while you are taking this medicine. This drug may make you feel generally unwell. This is not uncommon, as chemotherapy can affect healthy cells as well as cancer cells. Report any side effects. Continue your course of treatment even though you feel ill unless your doctor tells you to stop. In some cases, you may be given additional medicines to help  with side effects. Follow all directions for their use. Call your doctor or health care professional for advice if you get a fever, chills or sore throat, or other symptoms of a cold or flu. Do not treat yourself. This drug decreases your body's ability to fight infections. Try to avoid being around people who are sick. This medicine may increase your risk to bruise or bleed. Call your doctor or health care professional if you notice any unusual bleeding. Be careful brushing and flossing your teeth or using a toothpick because you may get an infection or bleed more easily. If you have any dental work done, tell your dentist you are receiving this medicine. Avoid taking products that contain aspirin, acetaminophen, ibuprofen, naproxen, or ketoprofen unless instructed by your doctor. These medicines may hide a fever. Do not become pregnant while taking this medicine. Women should inform their doctor if they wish to become pregnant or think they might be pregnant. There is a potential for serious side effects to an unborn child. Talk to your health care professional or pharmacist for more information. Do not breast-feed an infant while taking this medicine. What side effects may I notice from receiving this medicine? Side effects that you should report to your doctor or health care professional as soon as possible:  allergic reactions like skin rash, itching or hives, swelling of the face, lips, or tongue  signs of infection - fever or chills, cough, sore throat, pain or difficulty passing urine  signs of decreased platelets or bleeding - bruising, pinpoint red spots on the skin, black, tarry stools, nosebleeds  signs of decreased red blood cells - unusually weak or tired, fainting spells, lightheadedness  breathing problems  changes in hearing  changes in vision  chest pain  high blood pressure  low blood counts - This drug may decrease the number of white blood cells, red blood cells and  platelets. You may be at increased risk for infections and bleeding.  nausea and vomiting  pain, swelling, redness or irritation at the injection site  pain, tingling, numbness in the hands or feet  problems with balance, talking, walking  trouble passing urine or change in the amount of urine Side effects that usually do not require medical attention (report to your doctor or health care professional if they continue or are bothersome):  hair loss  loss of appetite  metallic taste in the mouth or changes in taste This list may not describe all possible side effects. Call your doctor for medical advice about side effects. You may  report side effects to FDA at 1-800-FDA-1088. Where should I keep my medicine? This drug is given in a hospital or clinic and will not be stored at home. NOTE: This sheet is a summary. It may not cover all possible information. If you have questions about this medicine, talk to your doctor, pharmacist, or health care provider.  2020 Elsevier/Gold Standard (2007-07-30 14:38:05)

## 2020-02-05 NOTE — Addendum Note (Signed)
Addended by: Truitt Merle on: 02/05/2020 07:59 AM   Modules accepted: Orders

## 2020-02-06 ENCOUNTER — Encounter: Payer: Self-pay | Admitting: *Deleted

## 2020-02-06 ENCOUNTER — Ambulatory Visit: Payer: Medicare Other

## 2020-02-09 ENCOUNTER — Ambulatory Visit: Payer: Medicare Other | Attending: General Surgery | Admitting: Physical Therapy

## 2020-02-09 ENCOUNTER — Other Ambulatory Visit: Payer: Self-pay

## 2020-02-09 DIAGNOSIS — M79622 Pain in left upper arm: Secondary | ICD-10-CM | POA: Diagnosis present

## 2020-02-09 DIAGNOSIS — R293 Abnormal posture: Secondary | ICD-10-CM | POA: Insufficient documentation

## 2020-02-09 DIAGNOSIS — C50412 Malignant neoplasm of upper-outer quadrant of left female breast: Secondary | ICD-10-CM | POA: Diagnosis present

## 2020-02-09 DIAGNOSIS — Z483 Aftercare following surgery for neoplasm: Secondary | ICD-10-CM | POA: Insufficient documentation

## 2020-02-09 DIAGNOSIS — Z170 Estrogen receptor positive status [ER+]: Secondary | ICD-10-CM | POA: Diagnosis present

## 2020-02-09 DIAGNOSIS — M25612 Stiffness of left shoulder, not elsewhere classified: Secondary | ICD-10-CM | POA: Diagnosis present

## 2020-02-09 NOTE — Therapy (Signed)
[facility] [place], [place], [address] Phone: [phone]   Fax:  [phone]  Physical Therapy Treatment  Patient Details  Name: Lindsay [name] MRN: [id number] Date of Birth: 1955-10-17 Referring Provider (PT): Dr. Autumn Messing   Encounter Date: 02/09/2020   PT End of Session - 02/09/20 1425    Visit Number 5    Number of Visits 10    Date for PT Re-Evaluation 02/26/20    PT Start Time 9024    PT Stop Time 1432    PT Time Calculation (min) 47 min    Activity Tolerance Patient tolerated treatment well    Behavior During Therapy [facility] for tasks assessed/performed           Past Medical History:  Diagnosis Date  . Bronchitis   . Family history of bone cancer   . Family history of breast cancer   . Family history of prostate cancer   . Fibromyalgia   . HCV (hepatitis C virus)   . MRSA (methicillin resistant Staphylococcus aureus) 2012    Past Surgical History:  Procedure Laterality Date  . MASTECTOMY MODIFIED RADICAL Left 12/26/2019   Procedure: LEFT MASTECTOMY MODIFIED RADICAL;  Surgeon: [name], MD;  Location: [facility];  Service: General;  Laterality: Left;  PEC BLOCK  . PORTACATH PLACEMENT Right 12/26/2019   Procedure: INSERTION PORT-A-CATH WITH ULTRASOUND GUIDANCE;  Surgeon: [name], MD;  Location: [facility];  Service: General;  Laterality: Right;    There were no vitals filed for this visit.   Subjective Assessment - 02/09/20 1557    Subjective COVID-19 screening performed upon arrival. Patient reports more soreness after last session due to scar mobilization but reported soreness dissipated the following day.    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 Patient underwent a left modified radical mastectomy on 12/26/2019 with 13 of 16 were positive for cancer. The Ki67 is 10%.    Patient Stated Goals Get my arm better     Currently in Pain? No/denies              [facility] PT Assessment - 02/09/20 0001      Assessment   Medical Diagnosis s/p left MRM    Referring Provider (PT) Dr. Autumn Messing    Onset Date/Surgical Date 12/26/19    Hand Dominance Right    Next MD Visit 02/12/2020    Prior Therapy Baselines      Precautions   Precautions Other (comment)    Precaution Comments active cancer,                  Katina Dung - 02/09/20 0001    Open a tight or new jar Unable    Do heavy household chores (wash walls, wash floors) Mild difficulty    Carry a shopping bag or briefcase Moderate difficulty    Wash your back Moderate difficulty    Use a knife to cut food Mild difficulty    Recreational activities in which you take some force or impact through your arm, shoulder, or hand (golf, hammering, tennis) Moderate difficulty    During the past week, to what extent has your arm, shoulder or hand problem interfered with your normal social activities with family, friends, neighbors, or groups? Slightly    During the past week, to what extent has your arm, shoulder or hand problem limited your work or other regular daily activities Modererately  Arm, shoulder, or hand pain. Moderate    Tingling (pins and needles) in your arm, shoulder, or hand None    Difficulty Sleeping No difficulty    DASH Score 38.64 %                  [facility] Adult PT Treatment/Exercise - 02/09/20 0001      Exercises   Exercises Shoulder      Shoulder Exercises: Seated   Extension Strengthening;Both;20 reps;Theraband    Theraband Level (Shoulder Extension) Level 2 (Red)    Row Strengthening;Both;20 reps;Theraband    Theraband Level (Shoulder Row) Level 2 (Red)    Horizontal ABduction Strengthening;Both;20 reps;Theraband    Theraband Level (Shoulder Horizontal ABduction) Level 2 (Red)      Shoulder Exercises: Pulleys   Flexion 5 minutes      Shoulder Exercises: ROM/Strengthening   Ranger standing ranger CW, CCW  circles x2 mins each    X to V Arms In sitting x20    Other ROM/Strengthening Exercises wall ladder level 27 x3 mins    Other ROM/Strengthening Exercises Scapular clocks 6 to 12 L UE x5 AROM; x5 with yellow threraband.      Shoulder Exercises: Stretch   Corner Stretch 3 reps;30 seconds                       PT Long Term Goals - 01/29/20 1638      PT LONG TERM GOAL #1   Title Patient will demonstrate she has regained full shoulder ROM and function post operatively compared to baselines.    Time 4    Period Weeks    Status On-going    Target Date 02/26/20      PT LONG TERM GOAL #2   Title Patient will increase left shoulder active flexion to >/= 130 degrees for increased ease reacing overhead.    Baseline 98 degrees post op; 146 pre-op    Time 4    Period Weeks    Status New    Target Date 02/26/20      PT LONG TERM GOAL #3   Title Patient will increase left shoulder active abduction to >/= 130 degrees for ability to obtain radiation positioning.    Baseline 110 degrees post op; 151 degrees pre-op    Time 4    Period Weeks    Status New    Target Date 02/26/20      PT LONG TERM GOAL #4   Title Patient will improve the Quick DASH score to be </= 10 for improved overall upper extremity function.    Baseline 27.27    Time 4    Period Weeks    Status New    Target Date 02/26/20      PT LONG TERM GOAL #5   Title Patient will verbalize good understanding of lymphedema risk reduction practices after attending the ABC class.    Time 4    Period Weeks    Status New    Target Date 02/26/20                 Plan - 02/09/20 1552    Clinical Impression Statement Patient responded well to therapy session with primary focus on stretching and gentle strengthening. Patient demonstrated good form with exercises after explanation. Patient provided with red resistance band for rows and extension for HEP. Scar mobilization and PROM of left shoulder to be performed  next visit.    Stability/Clinical Decision Making Stable/Uncomplicated  Clinical Decision Making Low    Rehab Potential Excellent    PT Frequency 2x / week    PT Duration 4 weeks    PT Treatment/Interventions ADLs/Self Care Home Management;Therapeutic exercise;Patient/family education;Manual techniques;Passive range of motion;Scar mobilization;Therapeutic activities    PT Next Visit Plan PROM, myofascial release to left axillary region, AAROM exercises; Avoid UBE, E-stim, ultrasound secondary to cancer/lymphedema precautions    PT Home Exercise Plan Post op shoulder ROM HEP    Consulted and Agree with Plan of Care Patient           Patient will benefit from skilled therapeutic intervention in order to improve the following deficits and impairments:  Postural dysfunction, Decreased range of motion, Decreased knowledge of precautions, Impaired UE functional use, Pain, Increased fascial restricitons, Decreased scar mobility  Visit Diagnosis: Malignant neoplasm of upper-outer quadrant of left breast in female, estrogen receptor positive (HCC)  Abnormal posture  Stiffness of left shoulder, not elsewhere classified  Pain in left upper arm  Aftercare following surgery for neoplasm     Problem List Patient Active Problem List   Diagnosis Date Noted  . Port-A-Cath in place 02/04/2020  . Family history of breast cancer   . Family history of prostate cancer   . Family history of bone cancer   . Malignant neoplasm of upper-outer quadrant of left breast in female, estrogen receptor negative ([place]) 10/31/2019    Gabriela Eves, PT, DPT 02/09/2020, 3:59 PM  [facility] [place], [place], [address] Phone: [phone]   Fax:  [phone]  Name: Lindsay [name] MRN: [id number] Date of Birth: [birth year]

## 2020-02-09 NOTE — Progress Notes (Signed)
[place]   [phone] [phone]   Clinic Follow up Note   Patient Care Team: Scotty Court, DO as PCP - General Mauro Kaufmann, RN as Oncology Nurse Navigator Rockwell Germany, RN as Oncology Nurse Navigator [name], MD as Consulting Physician (General Surgery) Truitt Merle, MD as Consulting Physician (Hematology) Eppie Gibson, MD as Attending Physician (Radiation Oncology)  Date of Service:  02/12/2020  CHIEF COMPLAINT: F/u of left breastmetaplasticcancer, triple negative   SUMMARY OF ONCOLOGIC HISTORY: Oncology History Overview Note  Cancer Staging Malignant neoplasm of upper-outer quadrant of left breast in female, estrogen receptor negative ([place]) Staging form: Breast, AJCC 8th Edition - Clinical stage from 10/28/2019: Stage IIIB (cT2, cN1, cM0, G3, ER-, PR-, HER2-) - Signed by Eppie Gibson, MD on 11/05/2019 - Pathologic stage from 12/26/2019: Stage IIIC (pT3, pN3a, cM0, G3, ER-, PR-, HER2-) - Signed by Truitt Merle, MD on 01/28/2020    Malignant neoplasm of upper-outer quadrant of left breast in female, estrogen receptor negative ([place])  10/27/2019 Mammogram   IMPRESSION: 1. Right breast calcifications spanning 2.6 cm in the upper slightly medial breast are indeterminate.   2. Left breast dominant mass at 3 o'clock, 6 cm from nipple measuring 3x3.9x2.9 cm is highly suspicious. There is overlying skin thickening, skin involvement is not excluded.   3. Left breast mass/distorted tissue at 1 o'clock, 4cm from nipple measuring 3.0x1.2x2.9 cm is likely in contiguity with the dominant mass at 3 o'clock and is also highly suspicious. With the dominant lesion the overall span a disease is approximately 6 cm.   4. Left breast distortion at 2 o'clock 10 cm from the nipple is Suspicious, measuring 1.0 x 0.8 cm.   5.  Abnormal left axillary lymph nodes (2). Cortical thickness measures up to 0.6 cm.    10/28/2019 Initial Biopsy    Diagnosis 1. Breast, left, needle core biopsy, 3 o'clock, 5cmfn - INVASIVE MAMMARY CARCINOMA. 2. Breast, left, needle core biopsy, 2 o'clock, 10cmfn - INVASIVE MAMMARY CARCINOMA. 3. 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 Microscopic Comment 1. -3. Overall, immunohistochemistry favors a pronounced desmoplastic response, but metaplastic carcinoma cannot be excluded (Epithelioid component: CKAE1AE3 strong +, CK5/6 weak +). The greatest linear extent of tumor in any one core in specimen 1 is 14 mm. The greatest linear extent of tumor in any one core in specimen 2 is 16 mm.   10/28/2019 Receptors her2   3. PROGNOSTIC INDICATORS Results: IMMUNOHISTOCHEMICAL AND MORPHOMETRIC ANALYSIS PERFORMED MANUALLY The tumor cells are EQUIVOCAL for Her2 (2+). Her2 by FISH will be performed and results reported separately. Estrogen Receptor: 0%, NEGATIVE Progesterone Receptor: 0%, NEGATIVE Proliferation Marker Ki67: 10%    3. FLUORESCENCE IN-SITU HYBRIDIZATION Results: GROUP 5: HER2 **NEGATIVE** Equivocal form of amplification of the HER2 gene was detected in the IHC 2+ tissue sample received from this individual. HER2 FISH was performed by a technologist and cell imaging and analysis on the [employer].   10/28/2019 Cancer Staging   Staging form: Breast, AJCC 8th Edition - Clinical stage from 10/28/2019: Stage IIIB (cT2, cN1, cM0, G3, ER-, PR-, HER2-) - Signed by Eppie Gibson, MD on 11/05/2019   10/31/2019 Initial Diagnosis   Malignant neoplasm of upper-outer quadrant of left breast in female, estrogen receptor negative ([place])   10/31/2019 Pathology Results   Diagnosis Breast, right, needle core biopsy, UOQ, posterior - FOCAL USUAL DUCTAL HYPERPLASIA AND FIBROCYSTIC CHANGES WITH CALCIFICATIONS - FIBROADENOMATOID CHANGES -  NO MALIGNANCY IDENTIFIED Microscopic Comment These results were called to [facility] on November 03, 2019.     11/05/2019 Genetic Testing   She declined Genetic testing    11/21/2019 Imaging   CT CAP w contrast  IMPRESSION: 1. Left breast mass and prominent left axillary lymph nodes, containing biopsy marking clips, consistent with newly diagnosed breast malignancy. 2. No definite evidence of distant metastatic disease in the chest, abdomen, or pelvis. 3. There are occasional small pulmonary nodules, measuring 2-3 mm, most likely incidental sequelae of prior infection or inflammation although metastatic disease is not excluded. Attention on follow-up. 4. Hepatic steatosis. 5. Aortic Atherosclerosis (ICD10-I70.0).     11/21/2019 Imaging   Bone Scan Whole Body IMPRESSION: 1. Single focus of uptake associated with a subacute fracture of LEFT anterior fourth rib. 2. No additional areas of abnormal uptake.   12/26/2019 Cancer Staging   Staging form: Breast, AJCC 8th Edition - Pathologic stage from 12/26/2019: Stage IIIC (pT3, pN3a, cM0, G3, ER-, PR-, HER2-) - Signed by Truitt Merle, MD on 01/28/2020   12/26/2019 Surgery   LEFT MASTECTOMY MODIFIED RADICAL and PAC Placement by Dr Marlou Starks   12/26/2019 Pathology Results   FINAL MICROSCOPIC DIAGNOSIS:   A. 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  - Ductal carcinoma in situ, high nuclear grade with central necrosis.  - Margins of resection:  - Metaplastic carcinoma focally involves the anterior margin and is < 1  mm from the posterior margin.  - DCIS is < 1 mm from the anterior margin.  - Metastatic carcinoma in (13) of (16) lymph nodes with extranodal  extension.  - Biopsy clip sites in breast and one lymph node.  - See oncology table.    ADDENDUM:  PROGNOSTIC INDICATOR RESULTS:  Immunohistochemical and morphometric analysis performed manually  The tumor cells are EQUIVOCAL for Her2 (2+). Her2 FISH has been ordered  and will be reported in an  addendum.  Estrogen Receptor:       NEGATIVE  Progesterone Receptor:   NEGATIVE  Proliferation Marker Ki-67:   30%    ADDENDUM:  FLOURESCENCE IN-SITU HYBRIDIZATION RESULTS:  GROUP 5:   HER2 NEGATIVE   01/28/2020 Echocardiogram   Baseline Echo  IMPRESSIONS     1. Left ventricular ejection fraction, by estimation, is 60 to 65%. The  left ventricle has normal function. The left ventricle has no regional  wall motion abnormalities. Left ventricular diastolic parameters are  consistent with Grade I diastolic  dysfunction (impaired relaxation). The average left ventricular global  longitudinal strain is -19.8 %.   2. Right ventricular systolic function is normal. The right ventricular  size is normal. Tricuspid regurgitation signal is inadequate for assessing  PA pressure.   3. The mitral valve is normal in structure. No evidence of mitral valve  regurgitation. No evidence of mitral stenosis.   4. The aortic valve is normal in structure. Aortic valve regurgitation is  not visualized. No aortic stenosis is present.   5. The inferior vena cava is normal in size with greater than 50%  respiratory variability, suggesting right atrial pressure of 3 mmHg.    02/05/2020 -  Chemotherapy   Keytruda q3weeks (to be taken for 1 whole year) with weekly Carboplatin/Taxol for 12 weeks starting 02/05/20 followed by Adriamycin/Cytoxan q2weeeks      CURRENT THERAPY:  Ballard Russell (to be taken for 1 whole year) with weekly  Carboplatin/Taxol for 12 weeks starting 02/05/20 followed by Adriamycin/Cytoxanq2weeks  INTERVAL HISTORY:  Lindsay [name] is here for a follow up. She presents to the clinic alone.  She tolerated the first week of chemotherapy overall well last week, had mild diarrhea for 1 day, mild fatigue, resolved.  She developed UTI symptoms over the weekend, no fever or chills, she called in the answering service and Cipro was called in.  Her dysuria has much improved.  She feels she  has recovered from chemo last week, no other new complaints.  She is eating well and drinking well.  Lost a few pounds in the past week.  All other systems were reviewed with the patient and are negative.  MEDICAL HISTORY:  Past Medical History:  Diagnosis Date  . Bronchitis   . Family history of bone cancer   . Family history of breast cancer   . Family history of prostate cancer   . Fibromyalgia   . HCV (hepatitis C virus)   . MRSA (methicillin resistant Staphylococcus aureus) 2012    SURGICAL HISTORY: Past Surgical History:  Procedure Laterality Date  . MASTECTOMY MODIFIED RADICAL Left 12/26/2019   Procedure: LEFT MASTECTOMY MODIFIED RADICAL;  Surgeon: [name], MD;  Location: [facility];  Service: General;  Laterality: Left;  PEC BLOCK  . PORTACATH PLACEMENT Right 12/26/2019   Procedure: INSERTION PORT-A-CATH WITH ULTRASOUND GUIDANCE;  Surgeon: [name], MD;  Location: [facility];  Service: General;  Laterality: Right;    I have reviewed the social history and family history with the patient and they are unchanged from previous note.  ALLERGIES:  is allergic to codeine.  MEDICATIONS:  Current Outpatient Medications  Medication Sig Dispense Refill  . acetaminophen (TYLENOL) 650 MG CR tablet Take 650 mg by mouth every 8 (eight) hours as needed for pain.     Marland Kitchen amoxicillin-clavulanate (AUGMENTIN) 875-125 MG tablet Take 1 tablet by mouth 2 (two) times daily. 14 tablet 0  . Ascorbic Acid (VITAMIN C PO) Take 1 tablet by mouth daily.    Marland Kitchen dexamethasone (DECADRON) 4 MG tablet Take 2 tablets (8 mg total) by mouth daily. Start the day after carboplatin chemotherapy for 3 days. 30 tablet 1  . lidocaine-prilocaine (EMLA) cream Apply 1 application topically as needed. 30 g 0  . methocarbamol (ROBAXIN) 750 MG tablet Take 1 tablet (750 mg total) by mouth 4 (four) times daily as needed (use for muscle cramps/pain). 30 tablet 2  . omeprazole (PRILOSEC) 20 MG capsule Take 1 capsule (20 mg  total) by mouth daily. 30 capsule 2  . ondansetron (ZOFRAN) 8 MG tablet Take 1 tablet (8 mg total) by mouth 2 (two) times daily as needed for refractory nausea / vomiting. Start on day 3 after carboplatin chemo. 30 tablet 1  . oxyCODONE (OXY IR/ROXICODONE) 5 MG immediate release tablet Take 1 tablet (5 mg total) by mouth every 6 (six) hours as needed (for pain score of 1-4). 20 tablet 0  . oxyCODONE (OXY IR/ROXICODONE) 5 MG immediate release tablet Take 1-2 tablets (5-10 mg total) by mouth every 6 (six) hours as needed for moderate pain, severe pain or breakthrough pain. 20 tablet 0  . prochlorperazine (COMPAZINE) 10 MG tablet Take 1 tablet (10 mg total) by mouth every 6 (six) hours as needed (Nausea or vomiting). 30 tablet 1   No current facility-administered medications for this visit.    PHYSICAL EXAMINATION: ECOG PERFORMANCE STATUS: 1 - Symptomatic but completely ambulatory  Vitals:   02/12/20  0846  BP: 138/89  Pulse: 86  Resp: 18  Temp: (!) 97.4 F (36.3 C)  SpO2: 99%   Filed Weights   02/12/20 0846  Weight: 192 lb 8 oz (87.3 kg)    GENERAL:alert, no distress and comfortable SKIN: skin color, texture, turgor are normal, no rashes or significant lesions EYES: normal, Conjunctiva are pink and non-injected, sclera clear NECK: supple, thyroid normal size, non-tender, without nodularity LYMPH:  no palpable lymphadenopathy in the cervical, axillary  LUNGS: clear to auscultation and percussion with normal breathing effort HEART: regular rate & rhythm and no murmurs and no lower extremity edema ABDOMEN:abdomen soft, non-tender and normal bowel sounds Musculoskeletal:no cyanosis of digits and no clubbing  NEURO: alert & oriented x 3 with fluent speech, no focal motor/sensory deficits  LABORATORY DATA:  I have reviewed the data as listed CBC Latest Ref Rng & Units 02/12/2020 02/04/2020 12/23/2019  WBC 4.0 - 10.5 K/uL 7.0 7.4 7.0  Hemoglobin 12.0 - 15.0 g/dL 12.6 12.7 14.6   Hematocrit 36 - 46 % 37.0 37.0 45.1  Platelets 150 - 400 K/uL 211 162 168     CMP Latest Ref Rng & Units 02/04/2020 12/23/2019 11/05/2019  Glucose 70 - 99 mg/dL 98 117(H) 106(H)  BUN 8 - 23 mg/dL '11 10 13  ' Creatinine 0.44 - 1.00 mg/dL 0.79 0.69 0.79  Sodium 135 - 145 mmol/L 139 141 143  Potassium 3.5 - 5.1 mmol/L 4.0 5.3(H) 4.6  Chloride 98 - 111 mmol/L 103 105 108  CO2 22 - 32 mmol/L '27 29 27  ' Calcium 8.9 - 10.3 mg/dL 9.0 9.5 9.3  Total Protein 6.5 - 8.1 g/dL 6.3(L) - 6.6  Total Bilirubin 0.3 - 1.2 mg/dL 0.4 - 0.6  Alkaline Phos 38 - 126 U/L 79 - 71  AST 15 - 41 U/L 41 - 33  ALT 0 - 44 U/L 22 - 17      RADIOGRAPHIC STUDIES: I have personally reviewed the radiological images as listed and agreed with the findings in the report. No results found.   ASSESSMENT & PLAN:  Lindsay [name] is a 64 y.o. female with    1.Left breast Multifocal Metaplastic cancer,pT3N3aM0,including one unresectable axillary node, metaplastic carcinoma,ER-/PR-/HER2-, grade IIIC -She was diagnosed in 10/2019 with left breastmetaplasticcancer, triple negative disease metastatic to left axillary LN. Her 11/2019 CT CAP and bone scan was negative for distant metastasis.  -She underwent left mastectomy with Dr Marlou Starks on 12/26/19. Surgical path showed6cm multifocal metaplastic carcinomaand13/16 positive LN but unfortunately 1 positive LN was not able to be removed given it invasion ofa vessel. -Due to the residual axillary lymph node, adjuvant radiation including axillary coverage, is critical,and I am not sure if adjuvant therapy including chemo and radiation can cure the residual disease. She has a very high risk ofcancer progression and distantrecurrence.  -she started adjuvant chemo with Ballard Russell with weeklyCarboplatin/Taxol for 12 weeks on 02/05/20 -S/p week 1 chemo, she tolerated well overall  -lab reviewed, adequate for treatment, will proceed week 2 carboplatin and Taxol at the same  dose -Follow-up in 2 weeks   2. Hepatitis C,untreated,diagnosed in 1992 -I will refer her to [facility] or [facility] for treatment after she completed chemo. -She also notes a history of MRSA which she is not sure has completed cleared. -We will watch her liver functions closely during the chemotherapy.We discussed her that she may have hepatitis C flare when she has chemo   3. Social and financial support  -She has no  children and no close relatives. She does have close friends who are supportive.  -She did not have medical insurance for many years. She recently applied for blue cross and blue shield which is not effective yet.  4. Recent UTI -treated with cipro, symptoms near resolved    PLAN: -Lab reviewed, adequate for treatment, will proceed week to [place] andTaxol at same dose, today and continue every week -Since patient is on Keytruda, I will change premedication dexamethasone to low-dose Solu-Medrol 10 mg before chemo, discussed with pharmacy  -Follow-up in 2 weeks  No problem-specific Assessment & Plan notes found for this encounter.   No orders of the defined types were placed in this encounter.  All questions were answered. The patient knows to call the clinic with any problems, questions or concerns. No barriers to learning was detected. The total time spent in the appointment was 30 minutes.     Truitt Merle, MD 02/12/2020   I, Joslyn Devon, am acting as scribe for Truitt Merle, MD.   I have reviewed the above documentation for accuracy and completeness, and I agree with the above.

## 2020-02-11 ENCOUNTER — Ambulatory Visit: Payer: Medicare Other | Admitting: Physical Therapy

## 2020-02-11 MED FILL — Dexamethasone Sodium Phosphate Inj 100 MG/10ML: INTRAMUSCULAR | Qty: 2 | Status: AC

## 2020-02-12 ENCOUNTER — Telehealth: Payer: Self-pay

## 2020-02-12 ENCOUNTER — Encounter: Payer: Self-pay | Admitting: Hematology

## 2020-02-12 ENCOUNTER — Inpatient Hospital Stay: Payer: Medicare Other

## 2020-02-12 ENCOUNTER — Encounter: Payer: Self-pay | Admitting: *Deleted

## 2020-02-12 ENCOUNTER — Other Ambulatory Visit: Payer: Self-pay

## 2020-02-12 ENCOUNTER — Inpatient Hospital Stay (HOSPITAL_BASED_OUTPATIENT_CLINIC_OR_DEPARTMENT_OTHER): Payer: Medicare Other | Admitting: Hematology

## 2020-02-12 ENCOUNTER — Inpatient Hospital Stay: Payer: Medicare Other | Attending: Hematology

## 2020-02-12 VITALS — BP 138/89 | HR 86 | Temp 97.4°F | Resp 18 | Ht 61.0 in | Wt 192.5 lb

## 2020-02-12 DIAGNOSIS — N390 Urinary tract infection, site not specified: Secondary | ICD-10-CM | POA: Diagnosis not present

## 2020-02-12 DIAGNOSIS — Z95828 Presence of other vascular implants and grafts: Secondary | ICD-10-CM

## 2020-02-12 DIAGNOSIS — Z171 Estrogen receptor negative status [ER-]: Secondary | ICD-10-CM

## 2020-02-12 DIAGNOSIS — Z5112 Encounter for antineoplastic immunotherapy: Secondary | ICD-10-CM | POA: Insufficient documentation

## 2020-02-12 DIAGNOSIS — B1920 Unspecified viral hepatitis C without hepatic coma: Secondary | ICD-10-CM | POA: Insufficient documentation

## 2020-02-12 DIAGNOSIS — C50412 Malignant neoplasm of upper-outer quadrant of left female breast: Secondary | ICD-10-CM | POA: Insufficient documentation

## 2020-02-12 DIAGNOSIS — E876 Hypokalemia: Secondary | ICD-10-CM | POA: Diagnosis not present

## 2020-02-12 DIAGNOSIS — D6481 Anemia due to antineoplastic chemotherapy: Secondary | ICD-10-CM | POA: Insufficient documentation

## 2020-02-12 DIAGNOSIS — Z8614 Personal history of Methicillin resistant Staphylococcus aureus infection: Secondary | ICD-10-CM | POA: Diagnosis not present

## 2020-02-12 DIAGNOSIS — R197 Diarrhea, unspecified: Secondary | ICD-10-CM | POA: Diagnosis not present

## 2020-02-12 DIAGNOSIS — Z5111 Encounter for antineoplastic chemotherapy: Secondary | ICD-10-CM | POA: Diagnosis present

## 2020-02-12 LAB — CBC WITH DIFFERENTIAL (CANCER CENTER ONLY)
Abs Immature Granulocytes: 0.04 10*3/uL (ref 0.00–0.07)
Basophils Absolute: 0.1 10*3/uL (ref 0.0–0.1)
Basophils Relative: 1 %
Eosinophils Absolute: 0.3 10*3/uL (ref 0.0–0.5)
Eosinophils Relative: 4 %
HCT: 37 % (ref 36.0–46.0)
Hemoglobin: 12.6 g/dL (ref 12.0–15.0)
Immature Granulocytes: 1 %
Lymphocytes Relative: 34 %
Lymphs Abs: 2.4 10*3/uL (ref 0.7–4.0)
MCH: 32 pg (ref 26.0–34.0)
MCHC: 34.1 g/dL (ref 30.0–36.0)
MCV: 93.9 fL (ref 80.0–100.0)
Monocytes Absolute: 0.5 10*3/uL (ref 0.1–1.0)
Monocytes Relative: 7 %
Neutro Abs: 3.8 10*3/uL (ref 1.7–7.7)
Neutrophils Relative %: 53 %
Platelet Count: 211 10*3/uL (ref 150–400)
RBC: 3.94 MIL/uL (ref 3.87–5.11)
RDW: 12.4 % (ref 11.5–15.5)
WBC Count: 7 10*3/uL (ref 4.0–10.5)
nRBC: 0 % (ref 0.0–0.2)

## 2020-02-12 LAB — CMP (CANCER CENTER ONLY)
ALT: 20 U/L (ref 0–44)
AST: 29 U/L (ref 15–41)
Albumin: 3.6 g/dL (ref 3.5–5.0)
Alkaline Phosphatase: 64 U/L (ref 38–126)
Anion gap: 5 (ref 5–15)
BUN: 10 mg/dL (ref 8–23)
CO2: 26 mmol/L (ref 22–32)
Calcium: 9.2 mg/dL (ref 8.9–10.3)
Chloride: 106 mmol/L (ref 98–111)
Creatinine: 0.66 mg/dL (ref 0.44–1.00)
GFR, Estimated: >60 mL/min (ref >60)
Glucose, Bld: 111 mg/dL — ABNORMAL HIGH (ref 70–99)
Potassium: 3.8 mmol/L (ref 3.5–5.1)
Sodium: 137 mmol/L (ref 135–145)
Total Bilirubin: 0.5 mg/dL (ref 0.3–1.2)
Total Protein: 6.2 g/dL — ABNORMAL LOW (ref 6.5–8.1)

## 2020-02-12 MED ORDER — SODIUM CHLORIDE 0.9 % IV SOLN
Freq: Once | INTRAVENOUS | Status: AC
  Filled 2020-02-12: qty 250

## 2020-02-12 MED ORDER — SODIUM CHLORIDE 0.9 % IV SOLN
152.5500 mg | Freq: Once | INTRAVENOUS | Status: AC
  Administered 2020-02-12: 150 mg via INTRAVENOUS
  Filled 2020-02-12: qty 15

## 2020-02-12 MED ORDER — FAMOTIDINE IN NACL 20-0.9 MG/50ML-% IV SOLN
INTRAVENOUS | Status: AC
  Filled 2020-02-12: qty 50

## 2020-02-12 MED ORDER — FAMOTIDINE IN NACL 20-0.9 MG/50ML-% IV SOLN
20.0000 mg | Freq: Once | INTRAVENOUS | Status: AC
  Administered 2020-02-12: 20 mg via INTRAVENOUS

## 2020-02-12 MED ORDER — METHYLPREDNISOLONE SODIUM SUCC 40 MG IJ SOLR
10.0000 mg | Freq: Once | INTRAMUSCULAR | Status: AC
  Administered 2020-02-12: 10 mg via INTRAVENOUS

## 2020-02-12 MED ORDER — PALONOSETRON HCL INJECTION 0.25 MG/5ML
INTRAVENOUS | Status: AC
  Filled 2020-02-12: qty 5

## 2020-02-12 MED ORDER — SODIUM CHLORIDE 0.9 % IV SOLN
80.0000 mg/m2 | Freq: Once | INTRAVENOUS | Status: AC
  Administered 2020-02-12: 156 mg via INTRAVENOUS
  Filled 2020-02-12: qty 26

## 2020-02-12 MED ORDER — LORATADINE 10 MG PO TABS
10.0000 mg | ORAL_TABLET | Freq: Every day | ORAL | Status: DC
  Administered 2020-02-12: 10 mg via ORAL

## 2020-02-12 MED ORDER — METHYLPREDNISOLONE SODIUM SUCC 40 MG IJ SOLR
INTRAMUSCULAR | Status: AC
  Filled 2020-02-12: qty 1

## 2020-02-12 MED ORDER — OMEPRAZOLE 20 MG PO CPDR: 20.0000 mg | DELAYED_RELEASE_CAPSULE | Freq: Every day | ORAL | 2 refills | Status: AC

## 2020-02-12 MED ORDER — HEPARIN SOD (PORK) LOCK FLUSH 100 UNIT/ML IV SOLN
500.0000 [IU] | Freq: Once | INTRAVENOUS | Status: AC | PRN
  Administered 2020-02-12: 500 [IU]
  Filled 2020-02-12: qty 5

## 2020-02-12 MED ORDER — SODIUM CHLORIDE 0.9% FLUSH
10.0000 mL | INTRAVENOUS | Status: DC | PRN
  Administered 2020-02-12: 10 mL
  Filled 2020-02-12: qty 10

## 2020-02-12 MED ORDER — LORATADINE 10 MG PO TABS
ORAL_TABLET | ORAL | Status: AC
  Filled 2020-02-12: qty 1

## 2020-02-12 MED ORDER — SODIUM CHLORIDE 0.9% FLUSH
10.0000 mL | Freq: Once | INTRAVENOUS | Status: AC
  Administered 2020-02-12: 10 mL
  Filled 2020-02-12: qty 10

## 2020-02-12 MED ORDER — PALONOSETRON HCL INJECTION 0.25 MG/5ML
0.2500 mg | Freq: Once | INTRAVENOUS | Status: AC
  Administered 2020-02-12: 0.25 mg via INTRAVENOUS

## 2020-02-12 NOTE — Patient Instructions (Signed)
   [facility] Discharge Instructions for Patients Receiving Chemotherapy  Today you received the following chemotherapy agents Taxol and Carboplatin   To help prevent nausea and vomiting after your treatment, we encourage you to take your nausea medication as directed.    If you develop nausea and vomiting that is not controlled by your nausea medication, call the clinic.   BELOW ARE SYMPTOMS THAT SHOULD BE REPORTED IMMEDIATELY:  *FEVER GREATER THAN 100.5 F  *CHILLS WITH OR WITHOUT FEVER  NAUSEA AND VOMITING THAT IS NOT CONTROLLED WITH YOUR NAUSEA MEDICATION  *UNUSUAL SHORTNESS OF BREATH  *UNUSUAL BRUISING OR BLEEDING  TENDERNESS IN MOUTH AND THROAT WITH OR WITHOUT PRESENCE OF ULCERS  *URINARY PROBLEMS  *BOWEL PROBLEMS  UNUSUAL RASH Items with * indicate a potential emergency and should be followed up as soon as possible.  Feel free to call the clinic should you have any questions or concerns. The clinic phone number is [phone].  Please show the CHEMO ALERT CARD at check-in to the Emergency Department and triage nurse.   

## 2020-02-12 NOTE — Telephone Encounter (Signed)
Ms Sedam called stating that as soon as she got home she vomited what she had drunk this am.  She has diarrhea anytime she moves.  She attempted to take imodium but vomited immediately.  She is diaphoretic. Per Dr Burr Medico we can sent zofran ODT prescription to her pharmacy.  She can take this and if tolerating liquids take imodium.  Ms Hollern is not comfortable with this plan.  She is calling 911 to see if they will take her to [facility] if not she will come by personal vehicle.

## 2020-02-12 NOTE — Telephone Encounter (Signed)
Lindsay [name] called stating she is feeling better and she is not going to the ED.  I encouraged her to take compazine now and again before bed.  She verbalized understanding.  She is requesting that the Paclitaxel be run over a longer time.

## 2020-02-13 ENCOUNTER — Encounter: Payer: Self-pay | Admitting: Hematology

## 2020-02-16 ENCOUNTER — Encounter: Payer: Medicare Other | Admitting: Physical Therapy

## 2020-02-16 ENCOUNTER — Telehealth: Payer: Self-pay

## 2020-02-16 NOTE — Telephone Encounter (Signed)
Lindsay [name] left a vm stating she is still not feeling well and is cancelling her appt for chemo this coming thursday.  I returned her call and left a vm asking if she wanted to be evaluated tomorrow.  I told her I would not cancel her appts on Thursday until I spoke with her.

## 2020-02-17 NOTE — Telephone Encounter (Signed)
Ms Macaulay returned my call.  She is very frustrated over how she felt after her last chemotherapy infusion.  She sent a mychart message that I forwarded to Dr. Burr Medico.  Ms Antonio is keeping her appts scheduled for 02/20/2020.

## 2020-02-17 NOTE — Progress Notes (Signed)
Lindsay [name]   [phone] [phone]   Clinic Follow up Note   Patient Care Team: Scotty Court, DO as PCP - General Mauro Kaufmann, RN as Oncology Nurse Navigator Rockwell Germany, RN as Oncology Nurse Navigator [name], MD as Consulting Physician (General Surgery) Truitt Merle, MD as Consulting Physician (Hematology) Eppie Gibson, MD as Attending Physician (Radiation Oncology)  Date of Service:  02/19/2020  CHIEF COMPLAINT: F/u of left breastmetaplasticcancer, triple negative   SUMMARY OF ONCOLOGIC HISTORY: Oncology History Overview Note  Cancer Staging Malignant neoplasm of upper-outer quadrant of left breast in female, estrogen receptor negative (Lindsay [name]) Staging form: Breast, AJCC 8th Edition - Clinical stage from 10/28/2019: Stage IIIB (cT2, cN1, cM0, G3, ER-, PR-, HER2-) - Signed by Eppie Gibson, MD on 11/05/2019 - Pathologic stage from 12/26/2019: Stage IIIC (pT3, pN3a, cM0, G3, ER-, PR-, HER2-) - Signed by Truitt Merle, MD on 01/28/2020    Malignant neoplasm of upper-outer quadrant of left breast in female, estrogen receptor negative (Lindsay [name])  10/27/2019 Mammogram   IMPRESSION: 1. Right breast calcifications spanning 2.6 cm in the upper slightly medial breast are indeterminate.   2. Left breast dominant mass at 3 o'clock, 6 cm from nipple measuring 3x3.9x2.9 cm is highly suspicious. There is overlying skin thickening, skin involvement is not excluded.   3. Left breast mass/distorted tissue at 1 o'clock, 4cm from nipple measuring 3.0x1.2x2.9 cm is likely in contiguity with the dominant mass at 3 o'clock and is also highly suspicious. With the dominant lesion the overall span a disease is approximately 6 cm.   4. Left breast distortion at 2 o'clock 10 cm from the nipple is Suspicious, measuring 1.0 x 0.8 cm.   5.  Abnormal left axillary lymph nodes (2). Cortical thickness measures up to 0.6 cm.    10/28/2019 Initial Biopsy    Diagnosis 1. Breast, left, needle core biopsy, 3 o'clock, 5cmfn - INVASIVE MAMMARY CARCINOMA. 2. Breast, left, needle core biopsy, 2 o'clock, 10cmfn - INVASIVE MAMMARY CARCINOMA. 3. 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 Microscopic Comment 1. -3. Overall, immunohistochemistry favors a pronounced desmoplastic response, but metaplastic carcinoma cannot be excluded (Epithelioid component: CKAE1AE3 strong +, CK5/6 weak +). The greatest linear extent of tumor in any one core in specimen 1 is 14 mm. The greatest linear extent of tumor in any one core in specimen 2 is 16 mm.   10/28/2019 Receptors her2   3. PROGNOSTIC INDICATORS Results: IMMUNOHISTOCHEMICAL AND MORPHOMETRIC ANALYSIS PERFORMED MANUALLY The tumor cells are EQUIVOCAL for Her2 (2+). Her2 by FISH will be performed and results reported separately. Estrogen Receptor: 0%, NEGATIVE Progesterone Receptor: 0%, NEGATIVE Proliferation Marker Ki67: 10%    3. FLUORESCENCE IN-SITU HYBRIDIZATION Results: GROUP 5: HER2 **NEGATIVE** Equivocal form of amplification of the HER2 gene was detected in the IHC 2+ tissue sample received from this individual. HER2 FISH was performed by a technologist and cell imaging and analysis on the [employer].   10/28/2019 Cancer Staging   Staging form: Breast, AJCC 8th Edition - Clinical stage from 10/28/2019: Stage IIIB (cT2, cN1, cM0, G3, ER-, PR-, HER2-) - Signed by Eppie Gibson, MD on 11/05/2019   10/31/2019 Initial Diagnosis   Malignant neoplasm of upper-outer quadrant of left breast in female, estrogen receptor negative (Lindsay [name])   10/31/2019 Pathology Results   Diagnosis Breast, right, needle core biopsy, UOQ, posterior - FOCAL USUAL DUCTAL HYPERPLASIA AND FIBROCYSTIC CHANGES WITH CALCIFICATIONS - FIBROADENOMATOID CHANGES -  NO MALIGNANCY IDENTIFIED Microscopic Comment These results were called to [facility] on November 03, 2019.     11/05/2019 Genetic Testing   She declined Genetic testing    11/21/2019 Imaging   CT CAP w contrast  IMPRESSION: 1. Left breast mass and prominent left axillary lymph nodes, containing biopsy marking clips, consistent with newly diagnosed breast malignancy. 2. No definite evidence of distant metastatic disease in the chest, abdomen, or pelvis. 3. There are occasional small pulmonary nodules, measuring 2-3 mm, most likely incidental sequelae of prior infection or inflammation although metastatic disease is not excluded. Attention on follow-up. 4. Hepatic steatosis. 5. Aortic Atherosclerosis (ICD10-I70.0).     11/21/2019 Imaging   Bone Scan Whole Body IMPRESSION: 1. Single focus of uptake associated with a subacute fracture of LEFT anterior fourth rib. 2. No additional areas of abnormal uptake.   12/26/2019 Cancer Staging   Staging form: Breast, AJCC 8th Edition - Pathologic stage from 12/26/2019: Stage IIIC (pT3, pN3a, cM0, G3, ER-, PR-, HER2-) - Signed by Truitt Merle, MD on 01/28/2020   12/26/2019 Surgery   LEFT MASTECTOMY MODIFIED RADICAL and PAC Placement by Dr Marlou Starks   12/26/2019 Pathology Results   FINAL MICROSCOPIC DIAGNOSIS:   A. 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  - Ductal carcinoma in situ, high nuclear grade with central necrosis.  - Margins of resection:  - Metaplastic carcinoma focally involves the anterior margin and is < 1  mm from the posterior margin.  - DCIS is < 1 mm from the anterior margin.  - Metastatic carcinoma in (13) of (16) lymph nodes with extranodal  extension.  - Biopsy clip sites in breast and one lymph node.  - See oncology table.    ADDENDUM:  PROGNOSTIC INDICATOR RESULTS:  Immunohistochemical and morphometric analysis performed manually  The tumor cells are EQUIVOCAL for Her2 (2+). Her2 FISH has been ordered  and will be reported in an  addendum.  Estrogen Receptor:       NEGATIVE  Progesterone Receptor:   NEGATIVE  Proliferation Marker Ki-67:   30%    ADDENDUM:  FLOURESCENCE IN-SITU HYBRIDIZATION RESULTS:  GROUP 5:   HER2 NEGATIVE   01/28/2020 Echocardiogram   Baseline Echo  IMPRESSIONS     1. Left ventricular ejection fraction, by estimation, is 60 to 65%. The  left ventricle has normal function. The left ventricle has no regional  wall motion abnormalities. Left ventricular diastolic parameters are  consistent with Grade I diastolic  dysfunction (impaired relaxation). The average left ventricular global  longitudinal strain is -19.8 %.   2. Right ventricular systolic function is normal. The right ventricular  size is normal. Tricuspid regurgitation signal is inadequate for assessing  PA pressure.   3. The mitral valve is normal in structure. No evidence of mitral valve  regurgitation. No evidence of mitral stenosis.   4. The aortic valve is normal in structure. Aortic valve regurgitation is  not visualized. No aortic stenosis is present.   5. The inferior vena cava is normal in size with greater than 50%  respiratory variability, suggesting right atrial pressure of 3 mmHg.    02/05/2020 -  Chemotherapy   Keytruda q3weeks (to be taken for 1 whole year) with weekly Carboplatin/Taxol for 12 weeks starting 02/05/20 followed by Adriamycin/Cytoxan q2weeeks      CURRENT THERAPY:  Keytrudaq3weeks(to be taken for 1 whole year)with weeklyCarboplatin/Taxolfor 12 weeksstarting 9/30/21followed  by Adriamycin/Cytoxanq2weeks  INTERVAL HISTORY:  Jeffrie Stander is here for a follow up. She presents to the clinic alone. She had severe diarrhea, nausea and vomiting when she got home after finishing chemo here one week ago. She was diaphoretic, and dizzy at home. She did come back here but felt much better when she arrived so she went home.  She has been feeling fatigued, with low appetite, mild diarrhea since  then, gradually improved a few days ago.  She started feeling much better overall and back to her normal baseline yesterday.  She lost a total of 6 pounds in the past week.  All other systems were reviewed with the patient and are negative.  MEDICAL HISTORY:  Past Medical History:  Diagnosis Date  . Bronchitis   . Family history of bone cancer   . Family history of breast cancer   . Family history of prostate cancer   . Fibromyalgia   . HCV (hepatitis C virus)   . MRSA (methicillin resistant Staphylococcus aureus) 2012    SURGICAL HISTORY: Past Surgical History:  Procedure Laterality Date  . MASTECTOMY MODIFIED RADICAL Left 12/26/2019   Procedure: LEFT MASTECTOMY MODIFIED RADICAL;  Surgeon: [name], MD;  Location: [place];  Service: General;  Laterality: Left;  PEC BLOCK  . PORTACATH PLACEMENT Right 12/26/2019   Procedure: INSERTION PORT-A-CATH WITH ULTRASOUND GUIDANCE;  Surgeon: [name], MD;  Location: [place];  Service: General;  Laterality: Right;    I have reviewed the social history and family history with the patient and they are unchanged from previous note.  ALLERGIES:  is allergic to codeine.  MEDICATIONS:  Current Outpatient Medications  Medication Sig Dispense Refill  . acetaminophen (TYLENOL) 650 MG CR tablet Take 650 mg by mouth every 8 (eight) hours as needed for pain.     Marland Kitchen amoxicillin-clavulanate (AUGMENTIN) 875-125 MG tablet Take 1 tablet by mouth 2 (two) times daily. 14 tablet 0  . Ascorbic Acid (VITAMIN C PO) Take 1 tablet by mouth daily.    Marland Kitchen dexamethasone (DECADRON) 4 MG tablet Take 2 tablets (8 mg total) by mouth daily. Start the day after carboplatin chemotherapy for 3 days. 30 tablet 1  . lidocaine-prilocaine (EMLA) cream Apply 1 application topically as needed. 30 g 0  . methocarbamol (ROBAXIN) 750 MG tablet Take 1 tablet (750 mg total) by mouth 4 (four) times daily as needed (use for muscle cramps/pain). 30 tablet 2  . omeprazole (PRILOSEC) 20  MG capsule Take 1 capsule (20 mg total) by mouth daily. 30 capsule 2  . ondansetron (ZOFRAN) 8 MG tablet Take 1 tablet (8 mg total) by mouth 2 (two) times daily as needed for refractory nausea / vomiting. Start on day 3 after carboplatin chemo. 30 tablet 1  . oxyCODONE (OXY IR/ROXICODONE) 5 MG immediate release tablet Take 1 tablet (5 mg total) by mouth every 6 (six) hours as needed (for pain score of 1-4). 20 tablet 0  . oxyCODONE (OXY IR/ROXICODONE) 5 MG immediate release tablet Take 1-2 tablets (5-10 mg total) by mouth every 6 (six) hours as needed for moderate pain, severe pain or breakthrough pain. 20 tablet 0  . prochlorperazine (COMPAZINE) 10 MG tablet Take 1 tablet (10 mg total) by mouth every 6 (six) hours as needed (Nausea or vomiting). 30 tablet 1   No current facility-administered medications for this visit.    PHYSICAL EXAMINATION: ECOG PERFORMANCE STATUS: 1 - Symptomatic but completely ambulatory  Vitals:   02/19/20 1306  BP: 126/77  Pulse: 92  Resp: 18  Temp: 98.1 F (36.7 C)  SpO2: 99%   Filed Weights   02/19/20 1306  Weight: 186 lb (84.4 kg)    GENERAL:alert, no distress and comfortable SKIN: skin color, texture, turgor are normal, no rashes or significant lesions EYES: normal, Conjunctiva are pink and non-injected, sclera clear NECK: supple, thyroid normal size, non-tender, without nodularity LYMPH:  no palpable lymphadenopathy in the cervical, axillary  LUNGS: clear to auscultation and percussion with normal breathing effort HEART: regular rate & rhythm and no murmurs and no lower extremity edema ABDOMEN:abdomen soft, non-tender and normal bowel sounds Musculoskeletal:no cyanosis of digits and no clubbing  NEURO: alert & oriented x 3 with fluent speech, no focal motor/sensory deficits  LABORATORY DATA:  I have reviewed the data as listed CBC Latest Ref Rng & Units 02/19/2020 02/12/2020 02/04/2020  WBC 4.0 - 10.5 K/uL 7.1 7.0 7.4  Hemoglobin 12.0 - 15.0 g/dL  12.0 12.6 12.7  Hematocrit 36 - 46 % 33.5(L) 37.0 37.0  Platelets 150 - 400 K/uL 395 211 162     CMP Latest Ref Rng & Units 02/12/2020 02/04/2020 12/23/2019  Glucose 70 - 99 mg/dL 111(H) 98 117(H)  BUN 8 - 23 mg/dL _0 Creatinine 0.44 - 1.00 mg/dL 0.66 0.79 0.69  Sodium 135 - 145 mmol/L 137 139 141  Potassium 3.5 - 5.1 mmol/L 3.8 4.0 5.3(H)  Chloride 98 - 111 mmol/L 106 103 105  CO2 22 - 32 mmol/L _1 Calcium 8.9 - 10.3 mg/dL 9.2 9.0 9.5  Total Protein 6.5 - 8.1 g/dL 6.2(L) 6.3(L) -  Total Bilirubin 0.3 - 1.2 mg/dL 0.5 0.4 -  Alkaline Phos 38 - 126 U/L 64 79 -  AST 15 - 41 U/L 29 41 -  ALT 0 - 44 U/L 20 22 -      RADIOGRAPHIC STUDIES: I have personally reviewed the radiological images as listed and agreed with the findings in the report. No results found.   ASSESSMENT & PLAN:  Lindsay [name] is a 64 y.o. female with    1.Left breast Multifocal Metaplastic cancer,pT3N3aM0,including one unresectable axillary node, metaplastic carcinoma,ER-/PR-/HER2-, grade IIIC -She was diagnosed in 10/2019 with left breastmetaplasticcancer, triple negative disease metastatic to left axillary LN. Her 11/2019 CT CAP and bone scan was negative for distant metastasis.  -She underwent left mastectomy with Dr Marlou Starks on 12/26/19.Surgical pathshowed6cm multifocal metaplastic carcinomaand13/16 positive LN but unfortunately 1 positive LN was not able to be removed given it invasion ofa vessel. -She started adjuvant chemo with Keytrudaq3weeks(to be taken for 1 whole year)with weekly Carboplatin/Taxol for 12 weeksbeginning 9/30/21followed by Adriamycin/Cytoxanq2weeksX4  -S/p week 2 treatment, she probably had delayed reaction to Taxol, with severe nausea, diarrhea, diaphoresis, and dizziness a few hours after infusion.  Her symptoms spontaneously improved without intervention. -Since she just recovered from last week chemo, I will give her 1 week of for better recovery -Plan to  slightly increase premedication Solu-Medrol, and Pepcid, and also will do slow infusion of Taxol from next week -We again reviewed symptom management, especially antiemetics and diarrhea management.  I called in Lomotil. -Lab reviewed.  Due to the hyponatremia and hypokalemia, plan to give IV fluids 2 hours today -Follow-up in 1 week before chemo   2. Hepatitis C,untreated,diagnosed in [place] her to ID or [employer] for treatment after she completed chemo. -She also notes a history of MRSA which she is not sure has completed cleared. -We  will watch her liver functions closely during the chemotherapy.We discussed her that she may have hepatitis C flare when she has chemo  3. Nausea, diarrhea, hypokalemia  -Secondary to week 2 chemotherapy -continue supportive care  4. Social and financial support  -She has no children and no close relatives. She does have close friends who are supportive.  -She did not have medical insurance for many years. She recently applied for blue cross and blue shield which is not effective yet.   PLAN: -We will hold chemotherapy today, and give 1 L normal saline with 20 mEq potassium -Restart chemotherapy next week, with slightly increased premedication Solu-Medrol to 20 mg, famotidine to 40 mg, and will reduce carbo and Taxol dose, slow infusion protocol for Taxol -f/u in one week     No problem-specific Assessment & Plan notes found for this encounter.   No orders of the defined types were placed in this encounter.  All questions were answered. The patient knows to call the clinic with any problems, questions or concerns. No barriers to learning was detected. The total time spent in the appointment was 30 minutes.     Truitt Merle, MD 02/19/2020   I, Joslyn Devon, am acting as scribe for Truitt Merle, MD.   I have reviewed the above documentation for accuracy and completeness, and I agree with the above.

## 2020-02-18 ENCOUNTER — Ambulatory Visit: Payer: Medicare Other | Admitting: Physical Therapy

## 2020-02-18 ENCOUNTER — Telehealth: Payer: Self-pay

## 2020-02-18 ENCOUNTER — Other Ambulatory Visit: Payer: Self-pay

## 2020-02-18 ENCOUNTER — Encounter: Payer: Self-pay | Admitting: Physical Therapy

## 2020-02-18 DIAGNOSIS — C50412 Malignant neoplasm of upper-outer quadrant of left female breast: Secondary | ICD-10-CM

## 2020-02-18 DIAGNOSIS — M25612 Stiffness of left shoulder, not elsewhere classified: Secondary | ICD-10-CM

## 2020-02-18 DIAGNOSIS — Z170 Estrogen receptor positive status [ER+]: Secondary | ICD-10-CM

## 2020-02-18 DIAGNOSIS — R293 Abnormal posture: Secondary | ICD-10-CM

## 2020-02-18 DIAGNOSIS — M79622 Pain in left upper arm: Secondary | ICD-10-CM

## 2020-02-18 DIAGNOSIS — Z483 Aftercare following surgery for neoplasm: Secondary | ICD-10-CM

## 2020-02-18 NOTE — Therapy (Signed)
[facility] [address], [place], [address] Phone: [phone]   Fax:  [phone]  Physical Therapy Treatment  Patient Details  Name: Lindsay [name] MRN: [id number] Date of Birth: 1955/09/19 Referring Provider (PT): Dr. Autumn Messing   Encounter Date: 02/18/2020   PT End of Session - 02/18/20 1450    Visit Number 6    Number of Visits 10    Date for PT Re-Evaluation 02/26/20    PT Start Time 1350    PT Stop Time 1436    PT Time Calculation (min) 46 min    Activity Tolerance Patient tolerated treatment well    Behavior During Therapy [facility] for tasks assessed/performed           Past Medical History:  Diagnosis Date  . Bronchitis   . Family history of bone cancer   . Family history of breast cancer   . Family history of prostate cancer   . Fibromyalgia   . HCV (hepatitis C virus)   . MRSA (methicillin resistant Staphylococcus aureus) 2012    Past Surgical History:  Procedure Laterality Date  . MASTECTOMY MODIFIED RADICAL Left 12/26/2019   Procedure: LEFT MASTECTOMY MODIFIED RADICAL;  Surgeon: [name], MD;  Location: [place];  Service: General;  Laterality: Left;  PEC BLOCK  . PORTACATH PLACEMENT Right 12/26/2019   Procedure: INSERTION PORT-A-CATH WITH ULTRASOUND GUIDANCE;  Surgeon: [name], MD;  Location: [place];  Service: General;  Laterality: Right;    There were no vitals filed for this visit.   Subjective Assessment - 02/18/20 1453    Subjective COVID-19 screening performed upon arrival. Patient reports having more symptoms from the infusions which has limited her ability to perform her HEP.    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 Patient underwent a left modified radical mastectomy on 12/26/2019 with 13 of 16 were positive for cancer. The Ki67 is 10%.    Patient Stated Goals Get my arm better    Currently in Pain?  No/denies              [facility] PT Assessment - 02/18/20 0001      Assessment   Medical Diagnosis s/p left MRM    Referring Provider (PT) Dr. Autumn Messing    Onset Date/Surgical Date 12/26/19    Hand Dominance Right    Next MD Visit 02/12/2020    Prior Therapy Baselines      Precautions   Precautions Other (comment)    Precaution Comments active cancer,                          [facility] Adult PT Treatment/Exercise - 02/18/20 0001      Exercises   Exercises Shoulder      Shoulder Exercises: Pulleys   Flexion 5 minutes      Manual Therapy   Manual Therapy Passive ROM;Myofascial release;Scapular mobilization    Myofascial Release Myofasical release to scar and lats/teres minor with low load long duration stretch to improve ABD and ER; in sidelying MFR to medial scapular border to inferior angle and UT    Scapular Mobilization L Scapular mobilization in all planes to improve ROM    Passive ROM PROM to left shoulder into flexion, ER, and abd                       PT Long  Term Goals - 01/29/20 1638      PT LONG TERM GOAL #1   Title Patient will demonstrate she has regained full shoulder ROM and function post operatively compared to baselines.    Time 4    Period Weeks    Status On-going    Target Date 02/26/20      PT LONG TERM GOAL #2   Title Patient will increase left shoulder active flexion to >/= 130 degrees for increased ease reacing overhead.    Baseline 98 degrees post op; 146 pre-op    Time 4    Period Weeks    Status New    Target Date 02/26/20      PT LONG TERM GOAL #3   Title Patient will increase left shoulder active abduction to >/= 130 degrees for ability to obtain radiation positioning.    Baseline 110 degrees post op; 151 degrees pre-op    Time 4    Period Weeks    Status New    Target Date 02/26/20      PT LONG TERM GOAL #4   Title Patient will improve the Quick DASH score to be </= 10 for improved overall upper extremity  function.    Baseline 27.27    Time 4    Period Weeks    Status New    Target Date 02/26/20      PT LONG TERM GOAL #5   Title Patient will verbalize good understanding of lymphedema risk reduction practices after attending the ABC class.    Time 4    Period Weeks    Status New    Target Date 02/26/20                 Plan - 02/18/20 1455    Clinical Impression Statement Patient responded well to therapy session with more focus on manual based therapy. PROM provided into L shoulder flexion and ER with excellent range and smooth arcs of motion. Scapular mobilizations performed with good response. Palpable scar tissue noted in axillary and lats/teres minor region and along mascectomy scar. Patient and PT discussed continuing HEP to maximize PT benefit. Patient reported understanding.    Stability/Clinical Decision Making Stable/Uncomplicated    Clinical Decision Making Low    Rehab Potential Excellent    PT Frequency 2x / week    PT Duration 4 weeks    PT Treatment/Interventions ADLs/Self Care Home Management;Therapeutic exercise;Patient/family education;Manual techniques;Passive range of motion;Scar mobilization;Therapeutic activities    PT Next Visit Plan PROM, myofascial release to left axillary region, AAROM exercises; Avoid UBE, E-stim, ultrasound secondary to cancer/lymphedema precautions    PT Home Exercise Plan Post op shoulder ROM HEP    Consulted and Agree with Plan of Care Patient           Patient will benefit from skilled therapeutic intervention in order to improve the following deficits and impairments:  Postural dysfunction, Decreased range of motion, Decreased knowledge of precautions, Impaired UE functional use, Pain, Increased fascial restricitons, Decreased scar mobility  Visit Diagnosis: Malignant neoplasm of upper-outer quadrant of left breast in female, estrogen receptor positive (HCC)  Abnormal posture  Stiffness of left shoulder, not elsewhere  classified  Pain in left upper arm  Aftercare following surgery for neoplasm     Problem List Patient Active Problem List   Diagnosis Date Noted  . Port-A-Cath in place 02/04/2020  . Family history of breast cancer   . Family history of prostate cancer   . Family history  of bone cancer   . Malignant neoplasm of upper-outer quadrant of left breast in female, estrogen receptor negative ([place]) 10/31/2019    Gabriela Eves, PT, DPT 02/18/2020, 3:06 PM  [facility] [place], [place], [address] Phone: [phone]   Fax:  [phone]  Name: Lindsay [name] MRN: [id number] Date of Birth: 05-Feb-1956

## 2020-02-18 NOTE — Telephone Encounter (Signed)
Per Dr. Burr Medico her ov appt on 10/15 moved to 1320.  I left vm for Ms Saltzman.

## 2020-02-19 ENCOUNTER — Inpatient Hospital Stay: Payer: Medicare Other

## 2020-02-19 ENCOUNTER — Inpatient Hospital Stay: Payer: Medicare Other | Admitting: Hematology

## 2020-02-19 ENCOUNTER — Other Ambulatory Visit: Payer: Self-pay

## 2020-02-19 ENCOUNTER — Encounter: Payer: Self-pay | Admitting: Hematology

## 2020-02-19 VITALS — BP 126/77 | HR 92 | Temp 98.1°F | Resp 18 | Ht 61.0 in | Wt 186.0 lb

## 2020-02-19 DIAGNOSIS — C50412 Malignant neoplasm of upper-outer quadrant of left female breast: Secondary | ICD-10-CM | POA: Diagnosis not present

## 2020-02-19 DIAGNOSIS — Z5112 Encounter for antineoplastic immunotherapy: Secondary | ICD-10-CM | POA: Diagnosis not present

## 2020-02-19 DIAGNOSIS — Z95828 Presence of other vascular implants and grafts: Secondary | ICD-10-CM

## 2020-02-19 DIAGNOSIS — Z171 Estrogen receptor negative status [ER-]: Secondary | ICD-10-CM | POA: Diagnosis not present

## 2020-02-19 LAB — CBC WITH DIFFERENTIAL (CANCER CENTER ONLY)
Abs Immature Granulocytes: 0.28 10*3/uL — ABNORMAL HIGH (ref 0.00–0.07)
Basophils Absolute: 0.1 10*3/uL (ref 0.0–0.1)
Basophils Relative: 2 %
Eosinophils Absolute: 0.2 10*3/uL (ref 0.0–0.5)
Eosinophils Relative: 2 %
HCT: 33.5 % — ABNORMAL LOW (ref 36.0–46.0)
Hemoglobin: 12 g/dL (ref 12.0–15.0)
Immature Granulocytes: 4 %
Lymphocytes Relative: 36 %
Lymphs Abs: 2.6 10*3/uL (ref 0.7–4.0)
MCH: 32.4 pg (ref 26.0–34.0)
MCHC: 35.8 g/dL (ref 30.0–36.0)
MCV: 90.5 fL (ref 80.0–100.0)
Monocytes Absolute: 1 10*3/uL (ref 0.1–1.0)
Monocytes Relative: 15 %
Neutro Abs: 2.9 10*3/uL (ref 1.7–7.7)
Neutrophils Relative %: 41 %
Platelet Count: 395 10*3/uL (ref 150–400)
RBC: 3.7 MIL/uL — ABNORMAL LOW (ref 3.87–5.11)
RDW: 12 % (ref 11.5–15.5)
WBC Count: 7.1 10*3/uL (ref 4.0–10.5)
nRBC: 0 % (ref 0.0–0.2)

## 2020-02-19 LAB — CMP (CANCER CENTER ONLY)
ALT: 12 U/L (ref 0–44)
AST: 35 U/L (ref 15–41)
Albumin: 3.4 g/dL — ABNORMAL LOW (ref 3.5–5.0)
Alkaline Phosphatase: 74 U/L (ref 38–126)
Anion gap: 8 (ref 5–15)
BUN: 9 mg/dL (ref 8–23)
CO2: 27 mmol/L (ref 22–32)
Calcium: 9.1 mg/dL (ref 8.9–10.3)
Chloride: 95 mmol/L — ABNORMAL LOW (ref 98–111)
Creatinine: 0.8 mg/dL (ref 0.44–1.00)
GFR, Estimated: >60 mL/min (ref >60)
Glucose, Bld: 110 mg/dL — ABNORMAL HIGH (ref 70–99)
Potassium: 3.3 mmol/L — ABNORMAL LOW (ref 3.5–5.1)
Sodium: 130 mmol/L — ABNORMAL LOW (ref 135–145)
Total Bilirubin: 0.5 mg/dL (ref 0.3–1.2)
Total Protein: 6.1 g/dL — ABNORMAL LOW (ref 6.5–8.1)

## 2020-02-19 MED ORDER — SODIUM CHLORIDE 0.9% FLUSH
10.0000 mL | Freq: Once | INTRAVENOUS | Status: AC
  Administered 2020-02-19: 10 mL
  Filled 2020-02-19: qty 10

## 2020-02-19 MED ORDER — POTASSIUM CHLORIDE IN NACL 20-0.9 MEQ/L-% IV SOLN
INTRAVENOUS | Status: DC
  Filled 2020-02-19 (×2): qty 1000

## 2020-02-19 MED ORDER — DIPHENOXYLATE-ATROPINE 2.5-0.025 MG PO TABS
1.0000 | ORAL_TABLET | Freq: Four times a day (QID) | ORAL | 1 refills | Status: AC | PRN
Start: 2020-02-19 — End: ?

## 2020-02-19 MED ORDER — HEPARIN SOD (PORK) LOCK FLUSH 100 UNIT/ML IV SOLN
500.0000 [IU] | Freq: Once | INTRAVENOUS | Status: AC
  Administered 2020-02-19: 500 [IU]
  Filled 2020-02-19: qty 5

## 2020-02-19 NOTE — Patient Instructions (Signed)

## 2020-02-23 ENCOUNTER — Other Ambulatory Visit: Payer: Self-pay

## 2020-02-23 ENCOUNTER — Ambulatory Visit: Payer: Medicare Other | Admitting: Physical Therapy

## 2020-02-23 ENCOUNTER — Encounter: Payer: Self-pay | Admitting: Physical Therapy

## 2020-02-23 DIAGNOSIS — M79622 Pain in left upper arm: Secondary | ICD-10-CM

## 2020-02-23 DIAGNOSIS — Z483 Aftercare following surgery for neoplasm: Secondary | ICD-10-CM

## 2020-02-23 DIAGNOSIS — C50412 Malignant neoplasm of upper-outer quadrant of left female breast: Secondary | ICD-10-CM | POA: Diagnosis not present

## 2020-02-23 DIAGNOSIS — Z170 Estrogen receptor positive status [ER+]: Secondary | ICD-10-CM

## 2020-02-23 DIAGNOSIS — R293 Abnormal posture: Secondary | ICD-10-CM

## 2020-02-23 DIAGNOSIS — M25612 Stiffness of left shoulder, not elsewhere classified: Secondary | ICD-10-CM

## 2020-02-23 NOTE — Therapy (Signed)
[facility] [place], [place], [address] Phone: [phone]   Fax:  [phone]  Physical Therapy Treatment  Patient Details  Name: Lindsay [name] MRN: [id number] Date of Birth: August 12, 1955 Referring Provider (PT): Dr. Autumn Messing   Encounter Date: 02/23/2020   PT End of Session - 02/23/20 1403    Visit Number 7    Number of Visits 10    Date for PT Re-Evaluation 02/26/20    PT Start Time 2992    PT Stop Time 1444    PT Time Calculation (min) 46 min    Activity Tolerance Patient tolerated treatment well    Behavior During Therapy [facility] for tasks assessed/performed           Past Medical History:  Diagnosis Date  . Bronchitis   . Family history of bone cancer   . Family history of breast cancer   . Family history of prostate cancer   . Fibromyalgia   . HCV (hepatitis C virus)   . MRSA (methicillin resistant Staphylococcus aureus) 2012    Past Surgical History:  Procedure Laterality Date  . MASTECTOMY MODIFIED RADICAL Left 12/26/2019   Procedure: LEFT MASTECTOMY MODIFIED RADICAL;  Surgeon: [name], MD;  Location: [place];  Service: General;  Laterality: Left;  PEC BLOCK  . PORTACATH PLACEMENT Right 12/26/2019   Procedure: INSERTION PORT-A-CATH WITH ULTRASOUND GUIDANCE;  Surgeon: [name], MD;  Location: [place];  Service: General;  Laterality: Right;    There were no vitals filed for this visit.   Subjective Assessment - 02/23/20 1459    Subjective COVID-19 screening performed upon arrival. Patient reports feeling a lot better after last infusion.    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 Patient underwent a left modified radical mastectomy on 12/26/2019 with 13 of 16 were positive for cancer. The Ki67 is 10%.    Currently in Pain? No/denies              [facility] PT Assessment - 02/23/20 0001      Assessment   Medical  Diagnosis s/p left MRM    Referring Provider (PT) Dr. Autumn Messing    Onset Date/Surgical Date 12/26/19    Hand Dominance Right    Next MD Visit 02/12/2020    Prior Therapy Baselines      Precautions   Precautions Other (comment)    Precaution Comments active cancer,                          [facility] Adult PT Treatment/Exercise - 02/23/20 0001      Exercises   Exercises Shoulder      Shoulder Exercises: Standing   Protraction Strengthening;Left;20 reps;Theraband    Theraband Level (Shoulder Protraction) Level 2 (Red)    External Rotation Strengthening;Left;20 reps;Theraband    Theraband Level (Shoulder External Rotation) Level 2 (Red)    Internal Rotation Strengthening;Left;20 reps;Theraband    Theraband Level (Shoulder Internal Rotation) Level 2 (Red)    ABduction Strengthening;Left;20 reps;Theraband    Extension Strengthening;Left;20 reps;Theraband    Row Strengthening;Left;20 reps;Theraband    Theraband Level (Shoulder Row) Level 2 (Red)      Shoulder Exercises: Pulleys   Flexion 5 minutes      Shoulder Exercises: ROM/Strengthening   Ranger standing ranger CW, CCW circles x2 mins each    Pushups 10 reps    Other ROM/Strengthening Exercises Scapular clocks  6 to 12 L UE x5 AROM; x5 with red theraband.                       PT Long Term Goals - 01/29/20 1638      PT LONG TERM GOAL #1   Title Patient will demonstrate she has regained full shoulder ROM and function post operatively compared to baselines.    Time 4    Period Weeks    Status On-going    Target Date 02/26/20      PT LONG TERM GOAL #2   Title Patient will increase left shoulder active flexion to >/= 130 degrees for increased ease reacing overhead.    Baseline 98 degrees post op; 146 pre-op    Time 4    Period Weeks    Status New    Target Date 02/26/20      PT LONG TERM GOAL #3   Title Patient will increase left shoulder active abduction to >/= 130 degrees for ability to obtain  radiation positioning.    Baseline 110 degrees post op; 151 degrees pre-op    Time 4    Period Weeks    Status New    Target Date 02/26/20      PT LONG TERM GOAL #4   Title Patient will improve the Quick DASH score to be </= 10 for improved overall upper extremity function.    Baseline 27.27    Time 4    Period Weeks    Status New    Target Date 02/26/20      PT LONG TERM GOAL #5   Title Patient will verbalize good understanding of lymphedema risk reduction practices after attending the ABC class.    Time 4    Period Weeks    Status New    Target Date 02/26/20                 Plan - 02/23/20 1450    Clinical Impression Statement Patient responded well to therapy session with reports of fatigue and pulling in axillary region of L arm. Patient provided with verbal and tactile cuing for form and technique with improved form at end of session. Patient and PT discussed basic anatomy of axillary region and muscles surrounding to which she reported understanding.    Stability/Clinical Decision Making Stable/Uncomplicated    Clinical Decision Making Low    Rehab Potential Excellent    PT Frequency 2x / week    PT Duration 4 weeks    PT Treatment/Interventions ADLs/Self Care Home Management;Therapeutic exercise;Patient/family education;Manual techniques;Passive range of motion;Scar mobilization;Therapeutic activities    PT Next Visit Plan PROM, myofascial release to left axillary region, AAROM exercises; Avoid UBE, E-stim, ultrasound secondary to cancer/lymphedema precautions    PT Home Exercise Plan Post op shoulder ROM HEP    Consulted and Agree with Plan of Care Patient           Patient will benefit from skilled therapeutic intervention in order to improve the following deficits and impairments:  Postural dysfunction, Decreased range of motion, Decreased knowledge of precautions, Impaired UE functional use, Pain, Increased fascial restricitons, Decreased scar  mobility  Visit Diagnosis: Malignant neoplasm of upper-outer quadrant of left breast in female, estrogen receptor positive (HCC)  Abnormal posture  Stiffness of left shoulder, not elsewhere classified  Pain in left upper arm  Aftercare following surgery for neoplasm     Problem List Patient Active Problem List   Diagnosis Date Noted  .  Port-A-Cath in place 02/04/2020  . Family history of breast cancer   . Family history of prostate cancer   . Family history of bone cancer   . Malignant neoplasm of upper-outer quadrant of left breast in female, estrogen receptor negative ([place]) 10/31/2019    Gabriela Eves, PT, DPT 02/23/2020, 2:59 PM  [facility] [place], [place], [address] Phone: [phone]   Fax:  [phone]  Name: Lindsay [name] MRN: [id number] Date of Birth: [birth year]

## 2020-02-23 NOTE — Progress Notes (Signed)
[facility]   [phone] [phone]   Clinic Follow up Note   Patient Care Team: Hart Robinsons, DO as PCP - General Pershing Proud, RN as Oncology Nurse Navigator Donnelly Angelica, RN as Oncology Nurse Navigator Griselda Miner, MD as Consulting Physician (General Surgery) Malachy Mood, MD as Consulting Physician (Hematology) Lonie Peak, MD as Attending Physician (Radiation Oncology)  Date of Service:  02/26/2020  CHIEF COMPLAINT: F/u of left breastmetaplasticcancer, triple negative   SUMMARY OF ONCOLOGIC HISTORY: Oncology History Overview Note  Cancer Staging Malignant neoplasm of upper-outer quadrant of left breast in female, estrogen receptor negative (HCC) Staging form: Breast, AJCC 8th Edition - Clinical stage from 10/28/2019: Stage IIIB (cT2, cN1, cM0, G3, ER-, PR-, HER2-) - Signed by Lonie Peak, MD on 11/05/2019 - Pathologic stage from 12/26/2019: Stage IIIC (pT3, pN3a, cM0, G3, ER-, PR-, HER2-) - Signed by Malachy Mood, MD on 01/28/2020    Malignant neoplasm of upper-outer quadrant of left breast in female, estrogen receptor negative (HCC)  10/27/2019 Mammogram   IMPRESSION: 1. Right breast calcifications spanning 2.6 cm in the upper slightly medial breast are indeterminate.   2. Left breast dominant mass at 3 o'clock, 6 cm from nipple measuring 3x3.9x2.9 cm is highly suspicious. There is overlying skin thickening, skin involvement is not excluded.   3. Left breast mass/distorted tissue at 1 o'clock, 4cm from nipple measuring 3.0x1.2x2.9 cm is likely in contiguity with the dominant mass at 3 o'clock and is also highly suspicious. With the dominant lesion the overall span a disease is approximately 6 cm.   4. Left breast distortion at 2 o'clock 10 cm from the nipple is Suspicious, measuring 1.0 x 0.8 cm.   5.  Abnormal left axillary lymph nodes (2). Cortical thickness measures up to 0.6 cm.    10/28/2019 Initial Biopsy    Diagnosis 1. Breast, left, needle core biopsy, 3 o'clock, 5cmfn - INVASIVE MAMMARY CARCINOMA. 2. Breast, left, needle core biopsy, 2 o'clock, 10cmfn - INVASIVE MAMMARY CARCINOMA. 3. 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 Microscopic Comment 1. -3. Overall, immunohistochemistry favors a pronounced desmoplastic response, but metaplastic carcinoma cannot be excluded (Epithelioid component: CKAE1AE3 strong +, CK5/6 weak +). The greatest linear extent of tumor in any one core in specimen 1 is 14 mm. The greatest linear extent of tumor in any one core in specimen 2 is 16 mm.   10/28/2019 Receptors her2   3. PROGNOSTIC INDICATORS Results: IMMUNOHISTOCHEMICAL AND MORPHOMETRIC ANALYSIS PERFORMED MANUALLY The tumor cells are EQUIVOCAL for Her2 (2+). Her2 by FISH will be performed and results reported separately. Estrogen Receptor: 0%, NEGATIVE Progesterone Receptor: 0%, NEGATIVE Proliferation Marker Ki67: 10%    3. FLUORESCENCE IN-SITU HYBRIDIZATION Results: GROUP 5: HER2 **NEGATIVE** Equivocal form of amplification of the HER2 gene was detected in the IHC 2+ tissue sample received from this individual. HER2 FISH was performed by a technologist and cell imaging and analysis on the [employer].   10/28/2019 Cancer Staging   Staging form: Breast, AJCC 8th Edition - Clinical stage from 10/28/2019: Stage IIIB (cT2, cN1, cM0, G3, ER-, PR-, HER2-) - Signed by Lonie Peak, MD on 11/05/2019   10/31/2019 Initial Diagnosis   Malignant neoplasm of upper-outer quadrant of left breast in female, estrogen receptor negative (HCC)   10/31/2019 Pathology Results   Diagnosis Breast, right, needle core biopsy, UOQ, posterior - FOCAL USUAL DUCTAL HYPERPLASIA AND FIBROCYSTIC CHANGES WITH CALCIFICATIONS - FIBROADENOMATOID CHANGES -  NO MALIGNANCY IDENTIFIED Microscopic Comment These results were called to [facility] on November 03, 2019.     11/05/2019 Genetic Testing   She declined Genetic testing    11/21/2019 Imaging   CT CAP w contrast  IMPRESSION: 1. Left breast mass and prominent left axillary lymph nodes, containing biopsy marking clips, consistent with newly diagnosed breast malignancy. 2. No definite evidence of distant metastatic disease in the chest, abdomen, or pelvis. 3. There are occasional small pulmonary nodules, measuring 2-3 mm, most likely incidental sequelae of prior infection or inflammation although metastatic disease is not excluded. Attention on follow-up. 4. Hepatic steatosis. 5. Aortic Atherosclerosis (ICD10-I70.0).     11/21/2019 Imaging   Bone Scan Whole Body IMPRESSION: 1. Single focus of uptake associated with a subacute fracture of LEFT anterior fourth rib. 2. No additional areas of abnormal uptake.   12/26/2019 Cancer Staging   Staging form: Breast, AJCC 8th Edition - Pathologic stage from 12/26/2019: Stage IIIC (pT3, pN3a, cM0, G3, ER-, PR-, HER2-) - Signed by Truitt Merle, MD on 01/28/2020   12/26/2019 Surgery   LEFT MASTECTOMY MODIFIED RADICAL and PAC Placement by Dr Marlou Starks   12/26/2019 Pathology Results   FINAL MICROSCOPIC DIAGNOSIS:   A. 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  - Ductal carcinoma in situ, high nuclear grade with central necrosis.  - Margins of resection:  - Metaplastic carcinoma focally involves the anterior margin and is < 1  mm from the posterior margin.  - DCIS is < 1 mm from the anterior margin.  - Metastatic carcinoma in (13) of (16) lymph nodes with extranodal  extension.  - Biopsy clip sites in breast and one lymph node.  - See oncology table.    ADDENDUM:  PROGNOSTIC INDICATOR RESULTS:  Immunohistochemical and morphometric analysis performed manually  The tumor cells are EQUIVOCAL for Her2 (2+). Her2 FISH has been ordered  and will be reported in an  addendum.  Estrogen Receptor:       NEGATIVE  Progesterone Receptor:   NEGATIVE  Proliferation Marker Ki-67:   30%    ADDENDUM:  FLOURESCENCE IN-SITU HYBRIDIZATION RESULTS:  GROUP 5:   HER2 NEGATIVE   01/28/2020 Echocardiogram   Baseline Echo  IMPRESSIONS     1. Left ventricular ejection fraction, by estimation, is 60 to 65%. The  left ventricle has normal function. The left ventricle has no regional  wall motion abnormalities. Left ventricular diastolic parameters are  consistent with Grade I diastolic  dysfunction (impaired relaxation). The average left ventricular global  longitudinal strain is -19.8 %.   2. Right ventricular systolic function is normal. The right ventricular  size is normal. Tricuspid regurgitation signal is inadequate for assessing  PA pressure.   3. The mitral valve is normal in structure. No evidence of mitral valve  regurgitation. No evidence of mitral stenosis.   4. The aortic valve is normal in structure. Aortic valve regurgitation is  not visualized. No aortic stenosis is present.   5. The inferior vena cava is normal in size with greater than 50%  respiratory variability, suggesting right atrial pressure of 3 mmHg.    02/05/2020 -  Chemotherapy   Keytruda q3weeks (to be taken for 1 whole year) with weekly Carboplatin/Taxol for 12 weeks starting 02/05/20 followed by Adriamycin/Cytoxan q2weeeks      CURRENT THERAPY:  Keytrudaq3weeks(to be taken for 1 whole year)with weeklyCarboplatin/Taxolfor 12 weeksstarting 9/30/21followed  by Adriamycin/Cytoxanq2weeks  INTERVAL HISTORY:  Lindsay [name] is here for a follow up. She presents to the clinic alone.  She overall feels much better, diarrhea resolved last week, her appetite and energy level much improved.  She is eating well, have resumed most of normal activities.  She gained 10 pounds back in the last week.  All other systems were reviewed with the patient and are negative.  MEDICAL  HISTORY:  Past Medical History:  Diagnosis Date  . Bronchitis   . Family history of bone cancer   . Family history of breast cancer   . Family history of prostate cancer   . Fibromyalgia   . HCV (hepatitis C virus)   . MRSA (methicillin resistant Staphylococcus aureus) 2012    SURGICAL HISTORY: Past Surgical History:  Procedure Laterality Date  . MASTECTOMY MODIFIED RADICAL Left 12/26/2019   Procedure: LEFT MASTECTOMY MODIFIED RADICAL;  Surgeon: [name], MD;  Location: [place];  Service: General;  Laterality: Left;  PEC BLOCK  . PORTACATH PLACEMENT Right 12/26/2019   Procedure: INSERTION PORT-A-CATH WITH ULTRASOUND GUIDANCE;  Surgeon: [name], MD;  Location: [place];  Service: General;  Laterality: Right;    I have reviewed the social history and family history with the patient and they are unchanged from previous note.  ALLERGIES:  is allergic to codeine.  MEDICATIONS:  Current Outpatient Medications  Medication Sig Dispense Refill  . acetaminophen (TYLENOL) 650 MG CR tablet Take 650 mg by mouth every 8 (eight) hours as needed for pain.     Marland Kitchen amoxicillin-clavulanate (AUGMENTIN) 875-125 MG tablet Take 1 tablet by mouth 2 (two) times daily. 14 tablet 0  . Ascorbic Acid (VITAMIN C PO) Take 1 tablet by mouth daily.    Marland Kitchen dexamethasone (DECADRON) 4 MG tablet Take 2 tablets (8 mg total) by mouth daily. Start the day after carboplatin chemotherapy for 3 days. 30 tablet 1  . diphenoxylate-atropine (LOMOTIL) 2.5-0.025 MG tablet Take 1-2 tablets by mouth 4 (four) times daily as needed for diarrhea or loose stools. 30 tablet 1  . lidocaine-prilocaine (EMLA) cream Apply 1 application topically as needed. 30 g 0  . methocarbamol (ROBAXIN) 750 MG tablet Take 1 tablet (750 mg total) by mouth 4 (four) times daily as needed (use for muscle cramps/pain). 30 tablet 2  . omeprazole (PRILOSEC) 20 MG capsule Take 1 capsule (20 mg total) by mouth daily. 30 capsule 2  . ondansetron (ZOFRAN) 8 MG  tablet Take 1 tablet (8 mg total) by mouth 2 (two) times daily as needed for refractory nausea / vomiting. Start on day 3 after carboplatin chemo. 30 tablet 1  . oxyCODONE (OXY IR/ROXICODONE) 5 MG immediate release tablet Take 1 tablet (5 mg total) by mouth every 6 (six) hours as needed (for pain score of 1-4). 20 tablet 0  . oxyCODONE (OXY IR/ROXICODONE) 5 MG immediate release tablet Take 1-2 tablets (5-10 mg total) by mouth every 6 (six) hours as needed for moderate pain, severe pain or breakthrough pain. 20 tablet 0  . prochlorperazine (COMPAZINE) 10 MG tablet Take 1 tablet (10 mg total) by mouth every 6 (six) hours as needed (Nausea or vomiting). 30 tablet 1   No current facility-administered medications for this visit.    PHYSICAL EXAMINATION: ECOG PERFORMANCE STATUS: 0 - Asymptomatic  Vitals:   02/26/20 0825  BP: 136/67  Pulse: 88  Resp: 18  Temp: 98.1 F (36.7 C)  SpO2: 99%   Filed Weights   02/26/20 0825  Weight: 197 lb 11.2 oz (89.7 kg)    GENERAL:alert, no distress and comfortable SKIN: skin color, texture, turgor are normal, no rashes or significant lesions EYES: normal, Conjunctiva are pink and non-injected, sclera clear NECK: supple, thyroid normal size, non-tender, without nodularity LYMPH:  no palpable lymphadenopathy in the cervical, axillary  LUNGS: clear to auscultation and percussion with normal breathing effort HEART: regular rate & rhythm and no murmurs and no lower extremity edema ABDOMEN:abdomen soft, non-tender and normal bowel sounds Musculoskeletal:no cyanosis of digits and no clubbing  NEURO: alert & oriented x 3 with fluent speech, no focal motor/sensory deficits  LABORATORY DATA:  I have reviewed the data as listed CBC Latest Ref Rng & Units 02/26/2020 02/19/2020 02/12/2020  WBC 4.0 - 10.5 K/uL 5.2 7.1 7.0  Hemoglobin 12.0 - 15.0 g/dL 11.3(L) 12.0 12.6  Hematocrit 36 - 46 % 33.0(L) 33.5(L) 37.0  Platelets 150 - 400 K/uL 249 395 211     CMP  Latest Ref Rng & Units 02/19/2020 02/12/2020 02/04/2020  Glucose 70 - 99 mg/dL 110(H) 111(H) 98  BUN 8 - 23 mg/dL $Remove'9 10 11  'ymFjrHo$ Creatinine 0.44 - 1.00 mg/dL 0.80 0.66 0.79  Sodium 135 - 145 mmol/L 130(L) 137 139  Potassium 3.5 - 5.1 mmol/L 3.3(L) 3.8 4.0  Chloride 98 - 111 mmol/L 95(L) 106 103  CO2 22 - 32 mmol/L $RemoveB'27 26 27  'JJaFwrud$ Calcium 8.9 - 10.3 mg/dL 9.1 9.2 9.0  Total Protein 6.5 - 8.1 g/dL 6.1(L) 6.2(L) 6.3(L)  Total Bilirubin 0.3 - 1.2 mg/dL 0.5 0.5 0.4  Alkaline Phos 38 - 126 U/L 74 64 79  AST 15 - 41 U/L 35 29 41  ALT 0 - 44 U/L $Remo'12 20 22      'fDRpj$ RADIOGRAPHIC STUDIES: I have personally reviewed the radiological images as listed and agreed with the findings in the report. No results found.   ASSESSMENT & PLAN:  Raileigh Sabater is a 65 y.o. female with    1.Left breast Multifocal Metaplastic cancer,pT3N3aM0,including one unresectable axillary node, metaplastic carcinoma,ER-/PR-/HER2-, grade IIIC -She was diagnosed in 10/2019 with left breastmetaplasticcancer, triple negative disease metastatic to left axillary LN. Her 11/2019 CT CAP and bone scan was negative for distant metastasis.  -She underwent left mastectomy with Dr Marlou Starks on 12/26/19.Surgical pathshowed6cm multifocal metaplastic carcinomaand13/16 positive LN but unfortunately 1 positive LN was not able to be removed given it invasion ofa vessel. -She startedadjuvant chemo with Keytrudaq3weeks(to be taken for 1 whole year)with weekly Carboplatin/Taxol for 12 weeksbeginning 9/30/21followed by Adriamycin/Cytoxanq2weeksX4  -S/p week 2 treatment, she probably had delayed reaction to Taxol, with severe nausea, diarrhea, diaphoresis, and dizziness a few hours after infusion.  Her symptoms spontaneously improved without intervention.  She now has recovered very well. -Lab reviewed, adequate for treatment, plan to proceed with x-ray carboplatinum and the paclitaxel today with dose reduction, and slightly increase premedication  Solu-Medrol, and Pepcid, and also will do slow infusion of Taxol.  -f/u next week before chemo    2. Hepatitis C,untreated,diagnosed in [place] her to ID or [employer] for treatment after she completed chemo. -She also notes a history of MRSA which she is not sure has completed cleared. -We will watch her liver functions closely during the chemotherapy.We discussed her that she may have hepatitis C flare when she has chemo  3. Nausea, diarrhea, hypokalemia  -Secondary to week 2 chemotherapy -resolved now   4. Social and financial support  -She has no children and no close relatives. She does  have close friends who are supportive.  -She did not have medical insurance for many years. She recently applied for blue cross and blue shield which is not effective yet.   PLAN: -Lab reviewed, adequate for treatment, will proceed week 3 carboplatin AUC 1, and paclitaxel 60 mg/m today with slow infusion, with increased premed, cycle 2 Keytruda today  -f/u next week before chemo, plan to increase carbo or Taxol dose next week if she does well     No problem-specific Assessment & Plan notes found for this encounter.   No orders of the defined types were placed in this encounter.  All questions were answered. The patient knows to call the clinic with any problems, questions or concerns. No barriers to learning was detected. The total time spent in the appointment was 30 minutes.     Truitt Merle, MD 02/26/2020   I, Joslyn Devon, am acting as scribe for Truitt Merle, MD.   I have reviewed the above documentation for accuracy and completeness, and I agree with the above.

## 2020-02-25 ENCOUNTER — Ambulatory Visit: Payer: Medicare Other | Admitting: Physical Therapy

## 2020-02-26 ENCOUNTER — Inpatient Hospital Stay (HOSPITAL_BASED_OUTPATIENT_CLINIC_OR_DEPARTMENT_OTHER): Payer: Medicare Other | Admitting: Hematology

## 2020-02-26 ENCOUNTER — Inpatient Hospital Stay: Payer: Medicare Other

## 2020-02-26 ENCOUNTER — Encounter: Payer: Self-pay | Admitting: Hematology

## 2020-02-26 ENCOUNTER — Other Ambulatory Visit: Payer: Self-pay

## 2020-02-26 VITALS — BP 136/67 | HR 88 | Temp 98.1°F | Resp 18 | Ht 61.0 in | Wt 197.7 lb

## 2020-02-26 VITALS — BP 127/69 | HR 73 | Temp 98.4°F | Resp 18

## 2020-02-26 DIAGNOSIS — Z171 Estrogen receptor negative status [ER-]: Secondary | ICD-10-CM | POA: Diagnosis not present

## 2020-02-26 DIAGNOSIS — C50412 Malignant neoplasm of upper-outer quadrant of left female breast: Secondary | ICD-10-CM | POA: Diagnosis not present

## 2020-02-26 DIAGNOSIS — Z95828 Presence of other vascular implants and grafts: Secondary | ICD-10-CM

## 2020-02-26 DIAGNOSIS — Z5112 Encounter for antineoplastic immunotherapy: Secondary | ICD-10-CM | POA: Diagnosis not present

## 2020-02-26 LAB — CMP (CANCER CENTER ONLY)
ALT: 14 U/L (ref 0–44)
AST: 35 U/L (ref 15–41)
Albumin: 3.1 g/dL — ABNORMAL LOW (ref 3.5–5.0)
Alkaline Phosphatase: 67 U/L (ref 38–126)
Anion gap: 10 (ref 5–15)
BUN: 9 mg/dL (ref 8–23)
CO2: 22 mmol/L (ref 22–32)
Calcium: 8.6 mg/dL — ABNORMAL LOW (ref 8.9–10.3)
Chloride: 105 mmol/L (ref 98–111)
Creatinine: 0.66 mg/dL (ref 0.44–1.00)
GFR, Estimated: >60 mL/min (ref >60)
Glucose, Bld: 121 mg/dL — ABNORMAL HIGH (ref 70–99)
Potassium: 3.9 mmol/L (ref 3.5–5.1)
Sodium: 137 mmol/L (ref 135–145)
Total Bilirubin: <0.2 mg/dL — ABNORMAL LOW (ref 0.3–1.2)
Total Protein: 6 g/dL — ABNORMAL LOW (ref 6.5–8.1)

## 2020-02-26 LAB — CBC WITH DIFFERENTIAL (CANCER CENTER ONLY)
Abs Immature Granulocytes: 0.07 10*3/uL (ref 0.00–0.07)
Basophils Absolute: 0.1 10*3/uL (ref 0.0–0.1)
Basophils Relative: 1 %
Eosinophils Absolute: 0.2 10*3/uL (ref 0.0–0.5)
Eosinophils Relative: 4 %
HCT: 33 % — ABNORMAL LOW (ref 36.0–46.0)
Hemoglobin: 11.3 g/dL — ABNORMAL LOW (ref 12.0–15.0)
Immature Granulocytes: 1 %
Lymphocytes Relative: 32 %
Lymphs Abs: 1.6 10*3/uL (ref 0.7–4.0)
MCH: 33.4 pg (ref 26.0–34.0)
MCHC: 34.2 g/dL (ref 30.0–36.0)
MCV: 97.6 fL (ref 80.0–100.0)
Monocytes Absolute: 0.6 10*3/uL (ref 0.1–1.0)
Monocytes Relative: 12 %
Neutro Abs: 2.6 10*3/uL (ref 1.7–7.7)
Neutrophils Relative %: 50 %
Platelet Count: 249 10*3/uL (ref 150–400)
RBC: 3.38 MIL/uL — ABNORMAL LOW (ref 3.87–5.11)
RDW: 13 % (ref 11.5–15.5)
WBC Count: 5.2 10*3/uL (ref 4.0–10.5)
nRBC: 0 % (ref 0.0–0.2)

## 2020-02-26 MED ORDER — SODIUM CHLORIDE 0.9 % IV SOLN
200.0000 mg | Freq: Once | INTRAVENOUS | Status: AC
  Administered 2020-02-26: 200 mg via INTRAVENOUS
  Filled 2020-02-26: qty 8

## 2020-02-26 MED ORDER — LORATADINE 10 MG PO TABS
ORAL_TABLET | ORAL | Status: AC
  Filled 2020-02-26: qty 1

## 2020-02-26 MED ORDER — HEPARIN SOD (PORK) LOCK FLUSH 100 UNIT/ML IV SOLN
500.0000 [IU] | Freq: Once | INTRAVENOUS | Status: AC | PRN
  Administered 2020-02-26: 500 [IU]
  Filled 2020-02-26: qty 5

## 2020-02-26 MED ORDER — SODIUM CHLORIDE 0.9 % IV SOLN
Freq: Once | INTRAVENOUS | Status: AC
  Filled 2020-02-26: qty 250

## 2020-02-26 MED ORDER — SODIUM CHLORIDE 0.9% FLUSH
10.0000 mL | Freq: Once | INTRAVENOUS | Status: AC
  Administered 2020-02-26: 10 mL
  Filled 2020-02-26: qty 10

## 2020-02-26 MED ORDER — METHYLPREDNISOLONE SODIUM SUCC 40 MG IJ SOLR
INTRAMUSCULAR | Status: AC
  Filled 2020-02-26: qty 1

## 2020-02-26 MED ORDER — METHYLPREDNISOLONE SODIUM SUCC 40 MG IJ SOLR
20.0000 mg | Freq: Once | INTRAMUSCULAR | Status: AC
  Administered 2020-02-26: 20 mg via INTRAVENOUS

## 2020-02-26 MED ORDER — PALONOSETRON HCL INJECTION 0.25 MG/5ML
0.2500 mg | Freq: Once | INTRAVENOUS | Status: AC
  Administered 2020-02-26: 0.25 mg via INTRAVENOUS

## 2020-02-26 MED ORDER — SODIUM CHLORIDE 0.9 % IV SOLN
101.7000 mg | Freq: Once | INTRAVENOUS | Status: AC
  Administered 2020-02-26: 100 mg via INTRAVENOUS
  Filled 2020-02-26: qty 10

## 2020-02-26 MED ORDER — LORATADINE 10 MG PO TABS
10.0000 mg | ORAL_TABLET | Freq: Every day | ORAL | Status: DC
  Administered 2020-02-26: 10 mg via ORAL

## 2020-02-26 MED ORDER — PALONOSETRON HCL INJECTION 0.25 MG/5ML
INTRAVENOUS | Status: AC
  Filled 2020-02-26: qty 5

## 2020-02-26 MED ORDER — SODIUM CHLORIDE 0.9 % IV SOLN
40.0000 mg | Freq: Once | INTRAVENOUS | Status: AC
  Administered 2020-02-26: 40 mg via INTRAVENOUS
  Filled 2020-02-26: qty 4

## 2020-02-26 MED ORDER — SODIUM CHLORIDE 0.9 % IV SOLN
60.0000 mg/m2 | Freq: Once | INTRAVENOUS | Status: AC
  Administered 2020-02-26: 114 mg via INTRAVENOUS
  Filled 2020-02-26: qty 19

## 2020-02-26 MED ORDER — SODIUM CHLORIDE 0.9% FLUSH
10.0000 mL | INTRAVENOUS | Status: DC | PRN
  Administered 2020-02-26: 10 mL
  Filled 2020-02-26: qty 10

## 2020-02-26 NOTE — Patient Instructions (Signed)
[facility] Discharge Instructions for Patients Receiving Chemotherapy  Today you received the following chemotherapy agents: Keytruda, Taxol, Carboplatin  To help prevent nausea and vomiting after your treatment, we encourage you to take your nausea medication as directed.   If you develop nausea and vomiting that is not controlled by your nausea medication, call the clinic.   BELOW ARE SYMPTOMS THAT SHOULD BE REPORTED IMMEDIATELY:  *FEVER GREATER THAN 100.5 F  *CHILLS WITH OR WITHOUT FEVER  NAUSEA AND VOMITING THAT IS NOT CONTROLLED WITH YOUR NAUSEA MEDICATION  *UNUSUAL SHORTNESS OF BREATH  *UNUSUAL BRUISING OR BLEEDING  TENDERNESS IN MOUTH AND THROAT WITH OR WITHOUT PRESENCE OF ULCERS  *URINARY PROBLEMS  *BOWEL PROBLEMS  UNUSUAL RASH Items with * indicate a potential emergency and should be followed up as soon as possible.  Feel free to call the clinic should you have any questions or concerns. The clinic phone number is [phone].  Please show the CHEMO ALERT CARD at check-in to the Emergency Department and triage nurse.   

## 2020-02-26 NOTE — Patient Instructions (Signed)

## 2020-02-26 NOTE — Progress Notes (Signed)
Taxol titrated again this cycle per MD request, due to delayed reaction after last treatment. Pt tolerated Taxol infusion without any further issues. She remained in the waiting area post-treatment for 1 hour without incident.

## 2020-02-27 ENCOUNTER — Telehealth: Payer: Self-pay | Admitting: Hematology

## 2020-02-27 NOTE — Telephone Encounter (Signed)
Scheduled appointments per 10/21 los. Will have updated calendar printed for patient at next appointment.

## 2020-03-01 ENCOUNTER — Encounter: Payer: Self-pay | Admitting: Physical Therapy

## 2020-03-01 ENCOUNTER — Ambulatory Visit: Payer: Medicare Other | Admitting: Physical Therapy

## 2020-03-01 ENCOUNTER — Other Ambulatory Visit: Payer: Self-pay

## 2020-03-01 DIAGNOSIS — R293 Abnormal posture: Secondary | ICD-10-CM

## 2020-03-01 DIAGNOSIS — M25612 Stiffness of left shoulder, not elsewhere classified: Secondary | ICD-10-CM

## 2020-03-01 DIAGNOSIS — Z483 Aftercare following surgery for neoplasm: Secondary | ICD-10-CM

## 2020-03-01 DIAGNOSIS — Z170 Estrogen receptor positive status [ER+]: Secondary | ICD-10-CM

## 2020-03-01 DIAGNOSIS — C50412 Malignant neoplasm of upper-outer quadrant of left female breast: Secondary | ICD-10-CM

## 2020-03-01 DIAGNOSIS — M79622 Pain in left upper arm: Secondary | ICD-10-CM

## 2020-03-01 NOTE — Therapy (Signed)
[facility] [place], [place], [address] Phone: [phone]   Fax:  [phone]  Physical Therapy Treatment  Patient Details  Name: Lindsay [name] MRN: [id number] Date of Birth: [birth year] Referring Provider (PT): Dr. Autumn Messing   Encounter Date: 03/01/2020   PT End of Session - 03/01/20 1959    Visit Number 8    Number of Visits 10    Date for PT Re-Evaluation 02/26/20    PT Start Time 7048    PT Stop Time 1428    PT Time Calculation (min) 43 min    Activity Tolerance Patient tolerated treatment well    Behavior During Therapy [facility] for tasks assessed/performed           Past Medical History:  Diagnosis Date  . Bronchitis   . Family history of bone cancer   . Family history of breast cancer   . Family history of prostate cancer   . Fibromyalgia   . HCV (hepatitis C virus)   . MRSA (methicillin resistant Staphylococcus aureus) 2012    Past Surgical History:  Procedure Laterality Date  . MASTECTOMY MODIFIED RADICAL Left 12/26/2019   Procedure: LEFT MASTECTOMY MODIFIED RADICAL;  Surgeon: [name], MD;  Location: [facility];  Service: General;  Laterality: Left;  PEC BLOCK  . PORTACATH PLACEMENT Right 12/26/2019   Procedure: INSERTION PORT-A-CATH WITH ULTRASOUND GUIDANCE;  Surgeon: [name], MD;  Location: [place];  Service: General;  Laterality: Right;    There were no vitals filed for this visit.   Subjective Assessment - 03/01/20 1958    Subjective COVID-19 screening performed upon arrival. Patient arrives feeling more swelling to left UE.    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 Patient underwent a left modified radical mastectomy on 12/26/2019 with 13 of 16 were positive for cancer. The Ki67 is 10%.    Patient Stated Goals Get my arm better    Currently in Pain? No/denies              [facility] PT Assessment -  03/01/20 0001      Assessment   Medical Diagnosis s/p left MRM    Referring Provider (PT) Dr. Autumn Messing    Onset Date/Surgical Date 12/26/19    Hand Dominance Right    Next MD Visit unsure    Prior Therapy Baselines      Precautions   Precautions Other (comment)    Precaution Comments active cancer,       PROM   Left Shoulder Flexion 164 Degrees    Left Shoulder ABduction 157 Degrees    Left Shoulder Internal Rotation 88 Degrees    Left Shoulder External Rotation 80 Degrees                         [facility] Adult PT Treatment/Exercise - 03/01/20 0001      Exercises   Exercises Shoulder      Shoulder Exercises: Pulleys   Flexion 5 minutes      Manual Therapy   Manual Therapy Passive ROM;Myofascial release;Scapular mobilization    Myofascial Release Myofasical release to scar and lats/teres minor with low load long duration stretch to improve ABD and ER; in sidelying MFR to medial scapular border to inferior angle and UT    Scapular Mobilization L Scapular mobilization in all planes to improve ROM    Passive ROM PROM to left  shoulder into flexion, ER, and abd                       PT Long Term Goals - 01/29/20 1638      PT LONG TERM GOAL #1   Title Patient will demonstrate she has regained full shoulder ROM and function post operatively compared to baselines.    Time 4    Period Weeks    Status On-going    Target Date 02/26/20      PT LONG TERM GOAL #2   Title Patient will increase left shoulder active flexion to >/= 130 degrees for increased ease reacing overhead.    Baseline 98 degrees post op; 146 pre-op    Time 4    Period Weeks    Status New    Target Date 02/26/20      PT LONG TERM GOAL #3   Title Patient will increase left shoulder active abduction to >/= 130 degrees for ability to obtain radiation positioning.    Baseline 110 degrees post op; 151 degrees pre-op    Time 4    Period Weeks    Status New    Target Date 02/26/20       PT LONG TERM GOAL #4   Title Patient will improve the Quick DASH score to be </= 10 for improved overall upper extremity function.    Baseline 27.27    Time 4    Period Weeks    Status New    Target Date 02/26/20      PT LONG TERM GOAL #5   Title Patient will verbalize good understanding of lymphedema risk reduction practices after attending the ABC class.    Time 4    Period Weeks    Status New    Target Date 02/26/20                 Plan - 03/01/20 1949    Clinical Impression Statement Patient responded well to therapy session with focus on PROM to L shoulder, STW/M and scapular mobilization. Notable erythema to scapular border with STW/M. Patient instructed to let oncologist and nurses at infusion regarding increased edema to left UE. Patient reported understanding.    Stability/Clinical Decision Making Stable/Uncomplicated    Clinical Decision Making Low    Rehab Potential Excellent    PT Frequency 2x / week    PT Duration 4 weeks    PT Treatment/Interventions ADLs/Self Care Home Management;Therapeutic exercise;Patient/family education;Manual techniques;Passive range of motion;Scar mobilization;Therapeutic activities    PT Next Visit Plan PROM, myofascial release to left axillary region, AAROM exercises; Avoid UBE, E-stim, ultrasound secondary to cancer/lymphedema precautions    PT Home Exercise Plan Post op shoulder ROM HEP    Consulted and Agree with Plan of Care Patient           Patient will benefit from skilled therapeutic intervention in order to improve the following deficits and impairments:  Postural dysfunction, Decreased range of motion, Decreased knowledge of precautions, Impaired UE functional use, Pain, Increased fascial restricitons, Decreased scar mobility  Visit Diagnosis: Malignant neoplasm of upper-outer quadrant of left breast in female, estrogen receptor positive (HCC)  Abnormal posture  Stiffness of left shoulder, not elsewhere  classified  Pain in left upper arm  Aftercare following surgery for neoplasm     Problem List Patient Active Problem List   Diagnosis Date Noted  . Port-A-Cath in place 02/04/2020  . Family history of breast cancer   .  Family history of prostate cancer   . Family history of bone cancer   . Malignant neoplasm of upper-outer quadrant of left breast in female, estrogen receptor negative ([place]) 10/31/2019    Gabriela Eves, PT, DPT 03/01/2020, 8:02 PM  [facility] [place], [place], [address] Phone: [phone]   Fax:  [phone]  Name: Lindsay [name] MRN: [id number] Date of Birth: 1956-01-13

## 2020-03-03 ENCOUNTER — Ambulatory Visit: Payer: Medicare Other | Admitting: Physical Therapy

## 2020-03-03 ENCOUNTER — Encounter: Payer: Self-pay | Admitting: Physical Therapy

## 2020-03-03 ENCOUNTER — Other Ambulatory Visit: Payer: Self-pay

## 2020-03-03 DIAGNOSIS — C50412 Malignant neoplasm of upper-outer quadrant of left female breast: Secondary | ICD-10-CM

## 2020-03-03 DIAGNOSIS — R293 Abnormal posture: Secondary | ICD-10-CM

## 2020-03-03 DIAGNOSIS — Z483 Aftercare following surgery for neoplasm: Secondary | ICD-10-CM

## 2020-03-03 DIAGNOSIS — M25612 Stiffness of left shoulder, not elsewhere classified: Secondary | ICD-10-CM

## 2020-03-03 DIAGNOSIS — M79622 Pain in left upper arm: Secondary | ICD-10-CM

## 2020-03-03 NOTE — Therapy (Addendum)
[facility] [place], [place], [address] Phone: [phone]   Fax:  [phone]  Physical Therapy Treatment  Patient Details  Name: Lindsay [name] MRN: [id number] Date of Birth: 1955-07-26 Referring Provider (PT): Dr. Autumn Messing   Encounter Date: 03/03/2020   PT End of Session - 03/03/20 1343    Visit Number 9    Number of Visits 18    Date for PT Re-Evaluation 04/09/20    PT Start Time 1341    PT Stop Time 1421    PT Time Calculation (min) 40 min    Activity Tolerance Patient tolerated treatment well    Behavior During Therapy [facility] for tasks assessed/performed           Past Medical History:  Diagnosis Date  . Bronchitis   . Family history of bone cancer   . Family history of breast cancer   . Family history of prostate cancer   . Fibromyalgia   . HCV (hepatitis C virus)   . MRSA (methicillin resistant Staphylococcus aureus) 2012    Past Surgical History:  Procedure Laterality Date  . MASTECTOMY MODIFIED RADICAL Left 12/26/2019   Procedure: LEFT MASTECTOMY MODIFIED RADICAL;  Surgeon: [name], MD;  Location: [facility];  Service: General;  Laterality: Left;  PEC BLOCK  . PORTACATH PLACEMENT Right 12/26/2019   Procedure: INSERTION PORT-A-CATH WITH ULTRASOUND GUIDANCE;  Surgeon: [name], MD;  Location: [facility];  Service: General;  Laterality: Right;    There were no vitals filed for this visit.   Subjective Assessment - 03/03/20 1341    Subjective COVID-19 screening performed upon arrival. Patient reports improvement with edema of LUE but has gone back to more water.    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 Patient underwent a left modified radical mastectomy on 12/26/2019 with 13 of 16 were positive for cancer. The Ki67 is 10%.    Patient Stated Goals Get my arm better    Currently in Pain? No/denies               [facility] PT Assessment - 03/03/20 0001      Assessment   Medical Diagnosis s/p left MRM    Referring Provider (PT) Dr. Autumn Messing    Onset Date/Surgical Date 12/26/19    Hand Dominance Right    Next MD Visit unsure    Prior Therapy Baselines      Precautions   Precautions Other (comment)    Precaution Comments active cancer,       AROM   Overall AROM  Within functional limits for tasks performed    AROM Assessment Site Shoulder    Right/Left Shoulder Left    Right Shoulder Flexion 158 Degrees                         [facility] Adult PT Treatment/Exercise - 03/03/20 0001      Shoulder Exercises: Standing   Protraction Strengthening;Left;20 reps;Theraband;10 reps    Theraband Level (Shoulder Protraction) Level 2 (Red)    External Rotation Strengthening;Left;20 reps;Theraband;10 reps    Theraband Level (Shoulder External Rotation) Level 2 (Red)    Internal Rotation Strengthening;Left;20 reps;Theraband;10 reps    Theraband Level (Shoulder Internal Rotation) Level 2 (Red)    Flexion Strengthening;Left;Theraband;10 reps    Theraband Level (Shoulder Flexion) Level 2 (Red)    Row Strengthening;Left;20 reps;Theraband;10 reps    Theraband Level (  Shoulder Row) Level 2 (Red)    Other Standing Exercises LUE scapular clock wall 2# x20 reps      Shoulder Exercises: Pulleys   Flexion 5 minutes      Shoulder Exercises: ROM/Strengthening   Ranger standing ranger CW, CCW circles x4 min                       PT Long Term Goals - 03/03/20 1455      PT LONG TERM GOAL #1   Title Patient will demonstrate she has regained full shoulder ROM and function post operatively compared to baselines.    Time 4    Period Weeks    Status On-going      PT LONG TERM GOAL #2   Title Patient will increase left shoulder active flexion to >/= 130 degrees for increased ease reacing overhead.    Baseline 98 degrees post op; 146 pre-op    Time 4    Period Weeks    Status Achieved       PT LONG TERM GOAL #3   Title Patient will increase left shoulder active abduction to >/= 130 degrees for ability to obtain radiation positioning.    Baseline 110 degrees post op; 151 degrees pre-op    Time 4    Period Weeks    Status On-going      PT LONG TERM GOAL #4   Title Patient will improve the Quick DASH score to be </= 10 for improved overall upper extremity function.    Baseline 27.27    Time 4    Period Weeks    Status On-going      PT LONG TERM GOAL #5   Title Patient will verbalize good understanding of lymphedema risk reduction practices after attending the ABC class.    Time 4    Period Weeks    Status On-going                 Plan - 03/03/20 1447    Clinical Impression Statement Patient presented in clinic with reports of improvement with ROM and edema for LUE. Patient guided through various ROM and strengthening with no complaints of fatigue or pain. No reports of pain or fatigue following end of treatment. Patient compliant with HEP.    Stability/Clinical Decision Making Stable/Uncomplicated    Rehab Potential Excellent    PT Frequency 2x / week    PT Duration 4 weeks    PT Treatment/Interventions ADLs/Self Care Home Management;Therapeutic exercise;Patient/family education;Manual techniques;Passive range of motion;Scar mobilization;Therapeutic activities    PT Next Visit Plan PROM, myofascial release to left axillary region, AAROM exercises; Avoid UBE, E-stim, ultrasound secondary to cancer/lymphedema precautions    PT Home Exercise Plan Post op shoulder ROM HEP    Consulted and Agree with Plan of Care Patient           Patient will benefit from skilled therapeutic intervention in order to improve the following deficits and impairments:  Postural dysfunction, Decreased range of motion, Decreased knowledge of precautions, Impaired UE functional use, Pain, Increased fascial restricitons, Decreased scar mobility  Visit Diagnosis: Malignant neoplasm of  upper-outer quadrant of left breast in female, estrogen receptor positive (HCC)  Abnormal posture  Stiffness of left shoulder, not elsewhere classified  Pain in left upper arm  Aftercare following surgery for neoplasm     Problem List Patient Active Problem List   Diagnosis Date Noted  . Port-A-Cath in place 02/04/2020  . Family history  of breast cancer   . Family history of prostate cancer   . Family history of bone cancer   . Malignant neoplasm of upper-outer quadrant of left breast in female, estrogen receptor negative ([place]) 10/31/2019    Standley Brooking, PTA 03/03/2020, 8:55 PM  [facility] [address] [place], [place], [address] Phone: [phone]   Fax:  [phone]  Name: Lindsay [name] MRN: [id number] Date of Birth: 05-Apr-1956

## 2020-03-03 NOTE — Progress Notes (Signed)
[place]   [phone] [phone]   Clinic Follow up Note   Patient Care Team: Scotty Court, DO as PCP - General Mauro Kaufmann, RN as Oncology Nurse Navigator Rockwell Germany, RN as Oncology Nurse Navigator [name], MD as Consulting Physician (General Surgery) Truitt Merle, MD as Consulting Physician (Hematology) Eppie Gibson, MD as Attending Physician (Radiation Oncology)  Date of Service:  03/04/2020  CHIEF COMPLAINT: F/u of left breastmetaplasticcancer, triple negative   SUMMARY OF ONCOLOGIC HISTORY: Oncology History Overview Note  Cancer Staging Malignant neoplasm of upper-outer quadrant of left breast in female, estrogen receptor negative ([place]) Staging form: Breast, AJCC 8th Edition - Clinical stage from 10/28/2019: Stage IIIB (cT2, cN1, cM0, G3, ER-, PR-, HER2-) - Signed by Eppie Gibson, MD on 11/05/2019 - Pathologic stage from 12/26/2019: Stage IIIC (pT3, pN3a, cM0, G3, ER-, PR-, HER2-) - Signed by Truitt Merle, MD on 01/28/2020    Malignant neoplasm of upper-outer quadrant of left breast in female, estrogen receptor negative ([place])  10/27/2019 Mammogram   IMPRESSION: 1. Right breast calcifications spanning 2.6 cm in the upper slightly medial breast are indeterminate.   2. Left breast dominant mass at 3 o'clock, 6 cm from nipple measuring 3x3.9x2.9 cm is highly suspicious. There is overlying skin thickening, skin involvement is not excluded.   3. Left breast mass/distorted tissue at 1 o'clock, 4cm from nipple measuring 3.0x1.2x2.9 cm is likely in contiguity with the dominant mass at 3 o'clock and is also highly suspicious. With the dominant lesion the overall span a disease is approximately 6 cm.   4. Left breast distortion at 2 o'clock 10 cm from the nipple is Suspicious, measuring 1.0 x 0.8 cm.   5.  Abnormal left axillary lymph nodes (2). Cortical thickness measures up to 0.6 cm.    10/28/2019 Initial Biopsy    Diagnosis 1. Breast, left, needle core biopsy, 3 o'clock, 5cmfn - INVASIVE MAMMARY CARCINOMA. 2. Breast, left, needle core biopsy, 2 o'clock, 10cmfn - INVASIVE MAMMARY CARCINOMA. 3. 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 Microscopic Comment 1. -3. Overall, immunohistochemistry favors a pronounced desmoplastic response, but metaplastic carcinoma cannot be excluded (Epithelioid component: CKAE1AE3 strong +, CK5/6 weak +). The greatest linear extent of tumor in any one core in specimen 1 is 14 mm. The greatest linear extent of tumor in any one core in specimen 2 is 16 mm.   10/28/2019 Receptors her2   3. PROGNOSTIC INDICATORS Results: IMMUNOHISTOCHEMICAL AND MORPHOMETRIC ANALYSIS PERFORMED MANUALLY The tumor cells are EQUIVOCAL for Her2 (2+). Her2 by FISH will be performed and results reported separately. Estrogen Receptor: 0%, NEGATIVE Progesterone Receptor: 0%, NEGATIVE Proliferation Marker Ki67: 10%    3. FLUORESCENCE IN-SITU HYBRIDIZATION Results: GROUP 5: HER2 **NEGATIVE** Equivocal form of amplification of the HER2 gene was detected in the IHC 2+ tissue sample received from this individual. HER2 FISH was performed by a technologist and cell imaging and analysis on the [employer].   10/28/2019 Cancer Staging   Staging form: Breast, AJCC 8th Edition - Clinical stage from 10/28/2019: Stage IIIB (cT2, cN1, cM0, G3, ER-, PR-, HER2-) - Signed by Eppie Gibson, MD on 11/05/2019   10/31/2019 Initial Diagnosis   Malignant neoplasm of upper-outer quadrant of left breast in female, estrogen receptor negative ([place])   10/31/2019 Pathology Results   Diagnosis Breast, right, needle core biopsy, UOQ, posterior - FOCAL USUAL DUCTAL HYPERPLASIA AND FIBROCYSTIC CHANGES WITH CALCIFICATIONS - FIBROADENOMATOID CHANGES -  NO MALIGNANCY IDENTIFIED Microscopic Comment These results were called to [facility] on November 03, 2019.     11/05/2019 Genetic Testing   She declined Genetic testing    11/21/2019 Imaging   CT CAP w contrast  IMPRESSION: 1. Left breast mass and prominent left axillary lymph nodes, containing biopsy marking clips, consistent with newly diagnosed breast malignancy. 2. No definite evidence of distant metastatic disease in the chest, abdomen, or pelvis. 3. There are occasional small pulmonary nodules, measuring 2-3 mm, most likely incidental sequelae of prior infection or inflammation although metastatic disease is not excluded. Attention on follow-up. 4. Hepatic steatosis. 5. Aortic Atherosclerosis (ICD10-I70.0).     11/21/2019 Imaging   Bone Scan Whole Body IMPRESSION: 1. Single focus of uptake associated with a subacute fracture of LEFT anterior fourth rib. 2. No additional areas of abnormal uptake.   12/26/2019 Cancer Staging   Staging form: Breast, AJCC 8th Edition - Pathologic stage from 12/26/2019: Stage IIIC (pT3, pN3a, cM0, G3, ER-, PR-, HER2-) - Signed by Truitt Merle, MD on 01/28/2020   12/26/2019 Surgery   LEFT MASTECTOMY MODIFIED RADICAL and PAC Placement by Dr Marlou Starks   12/26/2019 Pathology Results   FINAL MICROSCOPIC DIAGNOSIS:   A. 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  - Ductal carcinoma in situ, high nuclear grade with central necrosis.  - Margins of resection:  - Metaplastic carcinoma focally involves the anterior margin and is < 1  mm from the posterior margin.  - DCIS is < 1 mm from the anterior margin.  - Metastatic carcinoma in (13) of (16) lymph nodes with extranodal  extension.  - Biopsy clip sites in breast and one lymph node.  - See oncology table.    ADDENDUM:  PROGNOSTIC INDICATOR RESULTS:  Immunohistochemical and morphometric analysis performed manually  The tumor cells are EQUIVOCAL for Her2 (2+). Her2 FISH has been ordered  and will be reported in an  addendum.  Estrogen Receptor:       NEGATIVE  Progesterone Receptor:   NEGATIVE  Proliferation Marker Ki-67:   30%    ADDENDUM:  FLOURESCENCE IN-SITU HYBRIDIZATION RESULTS:  GROUP 5:   HER2 NEGATIVE   01/28/2020 Echocardiogram   Baseline Echo  IMPRESSIONS     1. Left ventricular ejection fraction, by estimation, is 60 to 65%. The  left ventricle has normal function. The left ventricle has no regional  wall motion abnormalities. Left ventricular diastolic parameters are  consistent with Grade I diastolic  dysfunction (impaired relaxation). The average left ventricular global  longitudinal strain is -19.8 %.   2. Right ventricular systolic function is normal. The right ventricular  size is normal. Tricuspid regurgitation signal is inadequate for assessing  PA pressure.   3. The mitral valve is normal in structure. No evidence of mitral valve  regurgitation. No evidence of mitral stenosis.   4. The aortic valve is normal in structure. Aortic valve regurgitation is  not visualized. No aortic stenosis is present.   5. The inferior vena cava is normal in size with greater than 50%  respiratory variability, suggesting right atrial pressure of 3 mmHg.    02/05/2020 -  Chemotherapy   Keytruda q3weeks (to be taken for 1 whole year) with weekly Carboplatin/Taxol for 12 weeks starting 02/05/20 followed by Adriamycin/Cytoxan q2weeeks      CURRENT THERAPY:  Keytrudaq3weeks(to be taken for 1 whole year)with weeklyCarboplatin/Taxolfor 12 weeksstarting 9/30/21followed  by Adriamycin/Cytoxanq2weeks  INTERVAL HISTORY:  Lindsay [name] is here for a follow up and treatment. She presents to the clinic alone.  She tolerated dose reduced carbo and Taxol very well last week, no infusion reaction.  She was slightly constipated 3 to 4 days after chemo, resolved.  Mild fatigue, no significant nausea, vomiting, diarrhea or other symptoms.  No neuropathy.  Weight is stable.  All other systems  were reviewed with the patient and are negative.  MEDICAL HISTORY:  Past Medical History:  Diagnosis Date  . Bronchitis   . Family history of bone cancer   . Family history of breast cancer   . Family history of prostate cancer   . Fibromyalgia   . HCV (hepatitis C virus)   . MRSA (methicillin resistant Staphylococcus aureus) 2012    SURGICAL HISTORY: Past Surgical History:  Procedure Laterality Date  . MASTECTOMY MODIFIED RADICAL Left 12/26/2019   Procedure: LEFT MASTECTOMY MODIFIED RADICAL;  Surgeon: [name], MD;  Location: [facility] [place];  Service: General;  Laterality: Left;  PEC BLOCK  . PORTACATH PLACEMENT Right 12/26/2019   Procedure: INSERTION PORT-A-CATH WITH ULTRASOUND GUIDANCE;  Surgeon: [name], MD;  Location: [place];  Service: General;  Laterality: Right;    I have reviewed the social history and family history with the patient and they are unchanged from previous note.  ALLERGIES:  is allergic to codeine.  MEDICATIONS:  Current Outpatient Medications  Medication Sig Dispense Refill  . acetaminophen (TYLENOL) 650 MG CR tablet Take 650 mg by mouth every 8 (eight) hours as needed for pain.     Marland Kitchen amoxicillin-clavulanate (AUGMENTIN) 875-125 MG tablet Take 1 tablet by mouth 2 (two) times daily. 14 tablet 0  . Ascorbic Acid (VITAMIN C PO) Take 1 tablet by mouth daily.    Marland Kitchen dexamethasone (DECADRON) 4 MG tablet Take 2 tablets (8 mg total) by mouth daily. Start the day after carboplatin chemotherapy for 3 days. 30 tablet 1  . diphenoxylate-atropine (LOMOTIL) 2.5-0.025 MG tablet Take 1-2 tablets by mouth 4 (four) times daily as needed for diarrhea or loose stools. 30 tablet 1  . lidocaine-prilocaine (EMLA) cream Apply 1 application topically as needed. 30 g 0  . methocarbamol (ROBAXIN) 750 MG tablet Take 1 tablet (750 mg total) by mouth 4 (four) times daily as needed (use for muscle cramps/pain). 30 tablet 2  . omeprazole (PRILOSEC) 20 MG capsule Take 1 capsule (20 mg total)  by mouth daily. 30 capsule 2  . ondansetron (ZOFRAN) 8 MG tablet Take 1 tablet (8 mg total) by mouth 2 (two) times daily as needed for refractory nausea / vomiting. Start on day 3 after carboplatin chemo. 30 tablet 1  . oxyCODONE (OXY IR/ROXICODONE) 5 MG immediate release tablet Take 1 tablet (5 mg total) by mouth every 6 (six) hours as needed (for pain score of 1-4). 20 tablet 0  . oxyCODONE (OXY IR/ROXICODONE) 5 MG immediate release tablet Take 1-2 tablets (5-10 mg total) by mouth every 6 (six) hours as needed for moderate pain, severe pain or breakthrough pain. 20 tablet 0  . prochlorperazine (COMPAZINE) 10 MG tablet Take 1 tablet (10 mg total) by mouth every 6 (six) hours as needed (Nausea or vomiting). 30 tablet 1   No current facility-administered medications for this visit.   Facility-Administered Medications Ordered in Other Visits  Medication Dose Route Frequency Provider Last Rate Last Admin  . CARBOplatin (PARAPLATIN) 150 mg in sodium chloride 0.9 % 100 mL chemo infusion  150 mg Intravenous Once Truitt Merle, MD      . heparin lock flush 100 unit/mL  500 Units Intracatheter Once PRN Truitt Merle, MD      . PACLitaxel (TAXOL) 114 mg in sodium chloride 0.9 % 250 mL chemo infusion (</= $RemoveBefor'80mg'vHIrkuetLGMD$ /m2)  60 mg/m2 (Treatment Plan Recorded) Intravenous Once Truitt Merle, MD 179 mL/hr at 03/04/20 1528 114 mg at 03/04/20 1528  . sodium chloride flush (NS) 0.9 % injection 10 mL  10 mL Intracatheter PRN Truitt Merle, MD        PHYSICAL EXAMINATION: ECOG PERFORMANCE STATUS: 1 - Symptomatic but completely ambulatory Blood pressure 137/74, heart rate 88, respirations 16, temperature 36.7, pulse ox 98% on room air GENERAL:alert, no distress and comfortable SKIN: skin color, texture, turgor are normal, no rashes or significant lesions Musculoskeletal:no cyanosis of digits and no clubbing  NEURO: alert & oriented x 3 with fluent speech, no focal motor/sensory deficits  LABORATORY DATA:  I have reviewed the data as  listed CBC Latest Ref Rng & Units 03/04/2020 02/26/2020 02/19/2020  WBC 4.0 - 10.5 K/uL 5.0 5.2 7.1  Hemoglobin 12.0 - 15.0 g/dL 11.5(L) 11.3(L) 12.0  Hematocrit 36 - 46 % 34.5(L) 33.0(L) 33.5(L)  Platelets 150 - 400 K/uL 183 249 395     CMP Latest Ref Rng & Units 03/04/2020 02/26/2020 02/19/2020  Glucose 70 - 99 mg/dL 102(H) 121(H) 110(H)  BUN 8 - 23 mg/dL $Remove'10 9 9  'vdrTDYY$ Creatinine 0.44 - 1.00 mg/dL 0.65 0.66 0.80  Sodium 135 - 145 mmol/L 140 137 130(L)  Potassium 3.5 - 5.1 mmol/L 3.9 3.9 3.3(L)  Chloride 98 - 111 mmol/L 108 105 95(L)  CO2 22 - 32 mmol/L $RemoveB'27 22 27  'PFExCJAW$ Calcium 8.9 - 10.3 mg/dL 9.1 8.6(L) 9.1  Total Protein 6.5 - 8.1 g/dL 6.0(L) 6.0(L) 6.1(L)  Total Bilirubin 0.3 - 1.2 mg/dL 0.5 <0.2(L) 0.5  Alkaline Phos 38 - 126 U/L 63 67 74  AST 15 - 41 U/L 41 35 35  ALT 0 - 44 U/L $Remo'19 14 12      'vDvKc$ RADIOGRAPHIC STUDIES: I have personally reviewed the radiological images as listed and agreed with the findings in the report. No results found.   ASSESSMENT & PLAN:  Trinady Milewski is a 64 y.o. female with    1.Left breast Multifocal Metaplastic cancer,pT3N3aM0,including one unresectable axillary node, metaplastic carcinoma,ER-/PR-/HER2-, grade IIIC -She was diagnosed in 10/2019 with left breastmetaplasticcancer, triple negative disease metastatic to left axillary LN. Her 11/2019 CT CAP and bone scan was negative for distant metastasis.  -She underwent left mastectomy with Dr Marlou Starks on 12/26/19.Surgical pathshowed6cm multifocal metaplastic carcinomaand13/16 positive LN but unfortunately 1 positive LN was not able to be removed given it invasion ofa vessel. -She startedadjuvant chemo with Keytrudaq3weeks(to be taken for 1 whole year)withweekly Carboplatin/Taxolfor 12 weeksbeginning9/30/21followed by Adriamycin/Cytoxanq2weeksX4 -She had a significant diarrhea after week 2 chemo, and tolerated weeks very well with dose reduction and slow infusion of Taxol -Lab reviewed,  adequate for treatment.  Will increase carboplatin to AUC 1.5, continue Taxol at 60 mg/m today.  If she tolerates well, will increase Taxol to 80 mg/m.  Will do slow Taxol infusion for 60 mins.  -Follow-up next week   2. Hepatitis C,untreated,[place] her to [place] or [employer] for treatment after she completed chemo. -She also notes a history of MRSA which she is not sure has completed cleared. -We will watch her liver functions closely during the chemotherapy.We discussed her that she may have  hepatitis C flare when she has chemo  3. Social and financial support  -She has no children and no close relatives. She does have close friends who are supportive.  -She did not have medical insurance for many years. She recently applied for blue cross and blue shield which is not effective yet.  4. Mild anemia -Secondary to chemotherapy, mild, will continue monitoring.  PLAN: -Lab reviewed, adequate for treatment, will proceed week 3 carboplatin AUC 1.5, and paclitaxel 60 mg/m today with slow infusion (8min) -f/u next week before chemo, plan to increase taxol to $Remove'80mg'uoJWcqW$ /m2 next week if she tolerates well      No problem-specific Assessment & Plan notes found for this encounter.   No orders of the defined types were placed in this encounter.  All questions were answered. The patient knows to call the clinic with any problems, questions or concerns. No barriers to learning was detected. The total time spent in the appointment was 30 minutes.     Truitt Merle, MD 03/04/2020   I, Joslyn Devon, am acting as scribe for Truitt Merle, MD.   I have reviewed the above documentation for accuracy and completeness, and I agree with the above.

## 2020-03-03 NOTE — Addendum Note (Signed)
Addended by: Gabriela Eves on: 03/03/2020 08:57 PM   Modules accepted: Orders

## 2020-03-04 ENCOUNTER — Inpatient Hospital Stay: Payer: Medicare Other

## 2020-03-04 ENCOUNTER — Encounter: Payer: Self-pay | Admitting: Hematology

## 2020-03-04 ENCOUNTER — Inpatient Hospital Stay (HOSPITAL_BASED_OUTPATIENT_CLINIC_OR_DEPARTMENT_OTHER): Payer: Medicare Other | Admitting: Hematology

## 2020-03-04 ENCOUNTER — Other Ambulatory Visit: Payer: Self-pay

## 2020-03-04 VITALS — BP 138/74 | HR 88 | Temp 98.4°F | Resp 16

## 2020-03-04 DIAGNOSIS — C50412 Malignant neoplasm of upper-outer quadrant of left female breast: Secondary | ICD-10-CM

## 2020-03-04 DIAGNOSIS — Z95828 Presence of other vascular implants and grafts: Secondary | ICD-10-CM

## 2020-03-04 DIAGNOSIS — Z171 Estrogen receptor negative status [ER-]: Secondary | ICD-10-CM | POA: Diagnosis not present

## 2020-03-04 DIAGNOSIS — Z5112 Encounter for antineoplastic immunotherapy: Secondary | ICD-10-CM | POA: Diagnosis not present

## 2020-03-04 LAB — CBC WITH DIFFERENTIAL (CANCER CENTER ONLY)
Abs Immature Granulocytes: 0.02 10*3/uL (ref 0.00–0.07)
Basophils Absolute: 0 10*3/uL (ref 0.0–0.1)
Basophils Relative: 1 %
Eosinophils Absolute: 0.2 10*3/uL (ref 0.0–0.5)
Eosinophils Relative: 4 %
HCT: 34.5 % — ABNORMAL LOW (ref 36.0–46.0)
Hemoglobin: 11.5 g/dL — ABNORMAL LOW (ref 12.0–15.0)
Immature Granulocytes: 0 %
Lymphocytes Relative: 34 %
Lymphs Abs: 1.7 10*3/uL (ref 0.7–4.0)
MCH: 31.7 pg (ref 26.0–34.0)
MCHC: 33.3 g/dL (ref 30.0–36.0)
MCV: 95 fL (ref 80.0–100.0)
Monocytes Absolute: 0.5 10*3/uL (ref 0.1–1.0)
Monocytes Relative: 9 %
Neutro Abs: 2.6 10*3/uL (ref 1.7–7.7)
Neutrophils Relative %: 52 %
Platelet Count: 183 10*3/uL (ref 150–400)
RBC: 3.63 MIL/uL — ABNORMAL LOW (ref 3.87–5.11)
RDW: 12.8 % (ref 11.5–15.5)
WBC Count: 5 10*3/uL (ref 4.0–10.5)
nRBC: 0 % (ref 0.0–0.2)

## 2020-03-04 LAB — CMP (CANCER CENTER ONLY)
ALT: 19 U/L (ref 0–44)
AST: 41 U/L (ref 15–41)
Albumin: 3.5 g/dL (ref 3.5–5.0)
Alkaline Phosphatase: 63 U/L (ref 38–126)
Anion gap: 5 (ref 5–15)
BUN: 10 mg/dL (ref 8–23)
CO2: 27 mmol/L (ref 22–32)
Calcium: 9.1 mg/dL (ref 8.9–10.3)
Chloride: 108 mmol/L (ref 98–111)
Creatinine: 0.65 mg/dL (ref 0.44–1.00)
GFR, Estimated: >60 mL/min (ref >60)
Glucose, Bld: 102 mg/dL — ABNORMAL HIGH (ref 70–99)
Potassium: 3.9 mmol/L (ref 3.5–5.1)
Sodium: 140 mmol/L (ref 135–145)
Total Bilirubin: 0.5 mg/dL (ref 0.3–1.2)
Total Protein: 6 g/dL — ABNORMAL LOW (ref 6.5–8.1)

## 2020-03-04 MED ORDER — LORATADINE 10 MG PO TABS
ORAL_TABLET | ORAL | Status: AC
  Filled 2020-03-04: qty 1

## 2020-03-04 MED ORDER — METHYLPREDNISOLONE SODIUM SUCC 40 MG IJ SOLR
INTRAMUSCULAR | Status: AC
  Filled 2020-03-04: qty 1

## 2020-03-04 MED ORDER — SODIUM CHLORIDE 0.9 % IV SOLN
Freq: Once | INTRAVENOUS | Status: AC
  Filled 2020-03-04: qty 250

## 2020-03-04 MED ORDER — SODIUM CHLORIDE 0.9 % IV SOLN
60.0000 mg/m2 | Freq: Once | INTRAVENOUS | Status: AC
  Administered 2020-03-04: 114 mg via INTRAVENOUS
  Filled 2020-03-04: qty 19

## 2020-03-04 MED ORDER — PALONOSETRON HCL INJECTION 0.25 MG/5ML
INTRAVENOUS | Status: AC
  Filled 2020-03-04: qty 5

## 2020-03-04 MED ORDER — SODIUM CHLORIDE 0.9 % IV SOLN
40.0000 mg | Freq: Once | INTRAVENOUS | Status: AC
  Administered 2020-03-04: 40 mg via INTRAVENOUS
  Filled 2020-03-04: qty 4

## 2020-03-04 MED ORDER — HEPARIN SOD (PORK) LOCK FLUSH 100 UNIT/ML IV SOLN
500.0000 [IU] | Freq: Once | INTRAVENOUS | Status: DC | PRN
  Filled 2020-03-04: qty 5

## 2020-03-04 MED ORDER — PALONOSETRON HCL INJECTION 0.25 MG/5ML: 0.2500 mg | Freq: Once | INTRAVENOUS | Status: DC

## 2020-03-04 MED ORDER — SODIUM CHLORIDE 0.9% FLUSH
10.0000 mL | Freq: Once | INTRAVENOUS | Status: AC
  Administered 2020-03-04: 10 mL
  Filled 2020-03-04: qty 10

## 2020-03-04 MED ORDER — METHYLPREDNISOLONE SODIUM SUCC 40 MG IJ SOLR
20.0000 mg | Freq: Once | INTRAMUSCULAR | Status: AC
  Administered 2020-03-04: 20 mg via INTRAVENOUS

## 2020-03-04 MED ORDER — FAMOTIDINE IN NACL 20-0.9 MG/50ML-% IV SOLN
INTRAVENOUS | Status: AC
  Filled 2020-03-04: qty 50

## 2020-03-04 MED ORDER — SODIUM CHLORIDE 0.9% FLUSH
10.0000 mL | INTRAVENOUS | Status: DC | PRN
  Filled 2020-03-04: qty 10

## 2020-03-04 MED ORDER — LORATADINE 10 MG PO TABS
10.0000 mg | ORAL_TABLET | Freq: Once | ORAL | Status: AC
  Administered 2020-03-04: 10 mg via ORAL

## 2020-03-04 MED ORDER — PALONOSETRON HCL INJECTION 0.25 MG/5ML
0.2500 mg | Freq: Once | INTRAVENOUS | Status: AC
  Administered 2020-03-04: 0.25 mg via INTRAVENOUS

## 2020-03-04 MED ORDER — SODIUM CHLORIDE 0.9 % IV SOLN
152.5500 mg | Freq: Once | INTRAVENOUS | Status: AC
  Administered 2020-03-04: 150 mg via INTRAVENOUS
  Filled 2020-03-04: qty 15

## 2020-03-04 NOTE — Progress Notes (Signed)
Per Dr. Burr Medico, infuse taxol over 90 minutes due to history of delayed reaction.

## 2020-03-04 NOTE — Patient Instructions (Signed)
[facility] Discharge Instructions for Patients Receiving Chemotherapy  Today you received the following chemotherapy agents Taxol, Carboplatin  To help prevent nausea and vomiting after your treatment, we encourage you to take your nausea medication as directed  If you develop nausea and vomiting that is not controlled by your nausea medication, call the clinic.   BELOW ARE SYMPTOMS THAT SHOULD BE REPORTED IMMEDIATELY:  *FEVER GREATER THAN 100.5 F  *CHILLS WITH OR WITHOUT FEVER  NAUSEA AND VOMITING THAT IS NOT CONTROLLED WITH YOUR NAUSEA MEDICATION  *UNUSUAL SHORTNESS OF BREATH  *UNUSUAL BRUISING OR BLEEDING  TENDERNESS IN MOUTH AND THROAT WITH OR WITHOUT PRESENCE OF ULCERS  *URINARY PROBLEMS  *BOWEL PROBLEMS  UNUSUAL RASH Items with * indicate a potential emergency and should be followed up as soon as possible.  Feel free to call the clinic should you have any questions or concerns. The clinic phone number is [phone].  Please show the CHEMO ALERT CARD at check-in to the Emergency Department and triage nurse.   

## 2020-03-04 NOTE — Patient Instructions (Signed)

## 2020-03-05 ENCOUNTER — Telehealth: Payer: Self-pay | Admitting: Hematology

## 2020-03-05 NOTE — Telephone Encounter (Signed)
Scheduled per 10/28 los. Pt is aware of appt times and date

## 2020-03-08 ENCOUNTER — Other Ambulatory Visit: Payer: Self-pay

## 2020-03-08 ENCOUNTER — Ambulatory Visit: Payer: Medicare Other | Attending: General Surgery | Admitting: Physical Therapy

## 2020-03-08 DIAGNOSIS — M79622 Pain in left upper arm: Secondary | ICD-10-CM | POA: Insufficient documentation

## 2020-03-08 DIAGNOSIS — Z170 Estrogen receptor positive status [ER+]: Secondary | ICD-10-CM | POA: Insufficient documentation

## 2020-03-08 DIAGNOSIS — Z483 Aftercare following surgery for neoplasm: Secondary | ICD-10-CM | POA: Insufficient documentation

## 2020-03-08 DIAGNOSIS — C50412 Malignant neoplasm of upper-outer quadrant of left female breast: Secondary | ICD-10-CM | POA: Diagnosis not present

## 2020-03-08 DIAGNOSIS — M25612 Stiffness of left shoulder, not elsewhere classified: Secondary | ICD-10-CM | POA: Insufficient documentation

## 2020-03-08 DIAGNOSIS — R293 Abnormal posture: Secondary | ICD-10-CM | POA: Diagnosis present

## 2020-03-08 NOTE — Therapy (Signed)
[facility] [place], [place], [address] Phone: [phone]   Fax:  [phone]  Physical Therapy Treatment Progress Note Reporting Period 11/05/2019 to 03/08/2020  See note below for Objective Data and Assessment of Progress/Goals. Patient has made excellent gains in ROM. Patient has made functional improvements with ADLs and work activities; DASH still >10, therefore goal still ongoing.    Patient Details  Name: Lindsay [name] MRN: [id number] Date of Birth: March 18, 1956 Referring Provider (PT): Dr. Autumn Messing   Encounter Date: 03/08/2020   PT End of Session - 03/08/20 1430    Visit Number 10    Number of Visits 18    Date for PT Re-Evaluation 04/09/20    PT Start Time 2080    PT Stop Time 1430    PT Time Calculation (min) 47 min    Activity Tolerance Patient tolerated treatment well    Behavior During Therapy [facility] for tasks assessed/performed           Past Medical History:  Diagnosis Date  . Bronchitis   . Family history of bone cancer   . Family history of breast cancer   . Family history of prostate cancer   . Fibromyalgia   . HCV (hepatitis C virus)   . MRSA (methicillin resistant Staphylococcus aureus) 2012    Past Surgical History:  Procedure Laterality Date  . MASTECTOMY MODIFIED RADICAL Left 12/26/2019   Procedure: LEFT MASTECTOMY MODIFIED RADICAL;  Surgeon: [name], MD;  Location: [facility];  Service: General;  Laterality: Left;  PEC BLOCK  . PORTACATH PLACEMENT Right 12/26/2019   Procedure: INSERTION PORT-A-CATH WITH ULTRASOUND GUIDANCE;  Surgeon: [name], MD;  Location: [place];  Service: General;  Laterality: Right;    There were no vitals filed for this visit.   Subjective Assessment - 03/08/20 1440    Subjective COVID-19 screening performed upon arrival. Patient reports minimal edema in left UE since last visit.    Pertinent History Patient was diagnosed on 10/27/2019 with left triple  negative invasive mammary carcinoma breast cancer. Patient underwent a left modified radical mastectomy on 12/26/2019 with 13 of 16 were positive for cancer. The Ki67 is 10%.    Patient Stated Goals Get my arm better    Currently in Pain? No/denies              [facility] PT Assessment - 03/08/20 0001      Assessment   Medical Diagnosis s/p left MRM    Referring Provider (PT) Dr. Autumn Messing    Onset Date/Surgical Date 12/26/19    Hand Dominance Right    Next MD Visit unsure    Prior Therapy Baselines      Precautions   Precautions Other (comment)    Precaution Comments active cancer,                  Katina Dung - 03/08/20 0001    Open a tight or new jar Mild difficulty    Do heavy household chores (wash walls, wash floors) Moderate difficulty    Carry a shopping bag or briefcase Mild difficulty    Wash your back Moderate difficulty    Use a knife to cut food Mild difficulty    Recreational activities in which you take some force or impact through your arm, shoulder, or hand (golf, hammering, tennis) Mild difficulty    During the past week, to what extent has your arm, shoulder or hand problem interfered with your  normal social activities with family, friends, neighbors, or groups? Not at all    During the past week, to what extent has your arm, shoulder or hand problem limited your work or other regular daily activities [place]    Arm, shoulder, or hand pain. Mild    Tingling (pins and needles) in your arm, shoulder, or hand None    Difficulty Sleeping Mild difficulty    DASH Score 27.27 %                  [facility] Adult PT Treatment/Exercise - 03/08/20 0001      Shoulder Exercises: Pulleys   Flexion 5 minutes      Manual Therapy   Manual Therapy Passive ROM;Myofascial release;Scapular mobilization    Myofascial Release Myofasical release to scar and lats/teres minor with low load long duration stretch to improve ABD and ER; in sidelying MFR to medial scapular  border to inferior angle and UT    Scapular Mobilization L Scapular mobilization in all planes to improve ROM in R sidelying    Passive ROM PROM to left shoulder into flexion, ER, and abd; rhythmic stabilization at 45, 90 and 120 degrees; shoulder in 90 abd for IR and ER rhythmic stabilization. D2 PNF for strengthening with resistance through  motion                       PT Long Term Goals - 03/08/20 1430      PT LONG TERM GOAL #1   Title Patient will demonstrate she has regained full shoulder ROM and function post operatively compared to baselines.    Time 4    Period Weeks    Status Achieved      PT LONG TERM GOAL #2   Title Patient will increase left shoulder active flexion to >/= 130 degrees for increased ease reacing overhead.    Baseline 98 degrees post op; 146 pre-op    Time 4    Period Weeks    Status Achieved      PT LONG TERM GOAL #3   Title Patient will increase left shoulder active abduction to >/= 130 degrees for ability to obtain radiation positioning.    Baseline 110 degrees post op; 151 degrees pre-op    Time 4    Period Weeks    Status Achieved      PT LONG TERM GOAL #4   Title Patient will improve the Quick DASH score to be </= 10 for improved overall upper extremity function.    Baseline 27.27    Time 4    Period Weeks    Status On-going      PT LONG TERM GOAL #5   Title Patient will verbalize good understanding of lymphedema risk reduction practices after attending the ABC class.    Time 4    Period Weeks    Status On-going                 Plan - 03/08/20 1436    Clinical Impression Statement Patient able to tolerate treatment well with focus on STW/M, scar mobilization and ROM of shoulder and scapula. Rhythmic stabilization initiated with excellent response. Patient with good resistance with D2 flexion PNF. Patient's ROM goals achieved. DASH goal still ongoing and patient has not attended ABC class regarding lymphedema class  therefore goal ongoing.    Stability/Clinical Decision Making Stable/Uncomplicated    Clinical Decision Making Low    Rehab Potential Excellent  PT Frequency 2x / week    PT Duration 4 weeks    PT Next Visit Plan PROM, myofascial release to left axillary region, AAROM exercises; Avoid UBE, E-stim, ultrasound secondary to cancer/lymphedema precautions    PT Home Exercise Plan Post op shoulder ROM HEP    Consulted and Agree with Plan of Care Patient           Patient will benefit from skilled therapeutic intervention in order to improve the following deficits and impairments:  Postural dysfunction, Decreased range of motion, Decreased knowledge of precautions, Impaired UE functional use, Pain, Increased fascial restricitons, Decreased scar mobility  Visit Diagnosis: Malignant neoplasm of upper-outer quadrant of left breast in female, estrogen receptor positive ([facility])  Abnormal posture  Stiffness of left shoulder, not elsewhere classified  Pain in left upper arm  Aftercare following surgery for neoplasm     Problem List Patient Active Problem List   Diagnosis Date Noted  . Port-A-Cath in place 02/04/2020  . Family history of breast cancer   . Family history of prostate cancer   . Family history of bone cancer   . Malignant neoplasm of upper-outer quadrant of left breast in female, estrogen receptor negative ([place]) 10/31/2019    Gabriela Eves, PT, DPT 03/08/2020, 2:56 PM  [facility] [place], [place], [address] Phone: [phone]   Fax:  [phone]  Name: Lindsay [name] MRN: [id number] Date of Birth: 1955-07-07

## 2020-03-10 ENCOUNTER — Ambulatory Visit: Payer: Medicare Other | Admitting: Physical Therapy

## 2020-03-10 ENCOUNTER — Other Ambulatory Visit: Payer: Self-pay

## 2020-03-10 ENCOUNTER — Encounter: Payer: Self-pay | Admitting: Physical Therapy

## 2020-03-10 DIAGNOSIS — M25612 Stiffness of left shoulder, not elsewhere classified: Secondary | ICD-10-CM

## 2020-03-10 DIAGNOSIS — M79622 Pain in left upper arm: Secondary | ICD-10-CM

## 2020-03-10 DIAGNOSIS — C50412 Malignant neoplasm of upper-outer quadrant of left female breast: Secondary | ICD-10-CM | POA: Diagnosis not present

## 2020-03-10 DIAGNOSIS — Z483 Aftercare following surgery for neoplasm: Secondary | ICD-10-CM

## 2020-03-10 DIAGNOSIS — Z170 Estrogen receptor positive status [ER+]: Secondary | ICD-10-CM

## 2020-03-10 DIAGNOSIS — R293 Abnormal posture: Secondary | ICD-10-CM

## 2020-03-10 NOTE — Progress Notes (Signed)
[place]   [phone] [phone]   Clinic Follow up Note   Patient Care Team: Scotty Court, DO as PCP - General Mauro Kaufmann, RN as Oncology Nurse Navigator Rockwell Germany, RN as Oncology Nurse Navigator [name], MD as Consulting Physician (General Surgery) Truitt Merle, MD as Consulting Physician (Hematology) Eppie Gibson, MD as Attending Physician (Radiation Oncology)  Date of Service:  03/11/2020  CHIEF COMPLAINT: F/u of left breastmetaplasticcancer, triple negative   SUMMARY OF ONCOLOGIC HISTORY: Oncology History Overview Note  Cancer Staging Malignant neoplasm of upper-outer quadrant of left breast in female, estrogen receptor negative ([place]) Staging form: Breast, AJCC 8th Edition - Clinical stage from 10/28/2019: Stage IIIB (cT2, cN1, cM0, G3, ER-, PR-, HER2-) - Signed by Eppie Gibson, MD on 11/05/2019 - Pathologic stage from 12/26/2019: Stage IIIC (pT3, pN3a, cM0, G3, ER-, PR-, HER2-) - Signed by Truitt Merle, MD on 01/28/2020    Malignant neoplasm of upper-outer quadrant of left breast in female, estrogen receptor negative ([place])  10/27/2019 Mammogram   IMPRESSION: 1. Right breast calcifications spanning 2.6 cm in the upper slightly medial breast are indeterminate.   2. Left breast dominant mass at 3 o'clock, 6 cm from nipple measuring 3x3.9x2.9 cm is highly suspicious. There is overlying skin thickening, skin involvement is not excluded.   3. Left breast mass/distorted tissue at 1 o'clock, 4cm from nipple measuring 3.0x1.2x2.9 cm is likely in contiguity with the dominant mass at 3 o'clock and is also highly suspicious. With the dominant lesion the overall span a disease is approximately 6 cm.   4. Left breast distortion at 2 o'clock 10 cm from the nipple is Suspicious, measuring 1.0 x 0.8 cm.   5.  Abnormal left axillary lymph nodes (2). Cortical thickness measures up to 0.6 cm.    10/28/2019 Initial Biopsy    Diagnosis 1. Breast, left, needle core biopsy, 3 o'clock, 5cmfn - INVASIVE MAMMARY CARCINOMA. 2. Breast, left, needle core biopsy, 2 o'clock, 10cmfn - INVASIVE MAMMARY CARCINOMA. 3. 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 Microscopic Comment 1. -3. Overall, immunohistochemistry favors a pronounced desmoplastic response, but metaplastic carcinoma cannot be excluded (Epithelioid component: CKAE1AE3 strong +, CK5/6 weak +). The greatest linear extent of tumor in any one core in specimen 1 is 14 mm. The greatest linear extent of tumor in any one core in specimen 2 is 16 mm.   10/28/2019 Receptors her2   3. PROGNOSTIC INDICATORS Results: IMMUNOHISTOCHEMICAL AND MORPHOMETRIC ANALYSIS PERFORMED MANUALLY The tumor cells are EQUIVOCAL for Her2 (2+). Her2 by FISH will be performed and results reported separately. Estrogen Receptor: 0%, NEGATIVE Progesterone Receptor: 0%, NEGATIVE Proliferation Marker Ki67: 10%    3. FLUORESCENCE IN-SITU HYBRIDIZATION Results: GROUP 5: HER2 **NEGATIVE** Equivocal form of amplification of the HER2 gene was detected in the IHC 2+ tissue sample received from this individual. HER2 FISH was performed by a technologist and cell imaging and analysis on the [employer].   10/28/2019 Cancer Staging   Staging form: Breast, AJCC 8th Edition - Clinical stage from 10/28/2019: Stage IIIB (cT2, cN1, cM0, G3, ER-, PR-, HER2-) - Signed by Eppie Gibson, MD on 11/05/2019   10/31/2019 Initial Diagnosis   Malignant neoplasm of upper-outer quadrant of left breast in female, estrogen receptor negative ([place])   10/31/2019 Pathology Results   Diagnosis Breast, right, needle core biopsy, UOQ, posterior - FOCAL USUAL DUCTAL HYPERPLASIA AND FIBROCYSTIC CHANGES WITH CALCIFICATIONS - FIBROADENOMATOID CHANGES -  NO MALIGNANCY IDENTIFIED Microscopic Comment These results were called to [facility] on November 03, 2019.     11/05/2019 Genetic Testing   She declined Genetic testing    11/21/2019 Imaging   CT CAP w contrast  IMPRESSION: 1. Left breast mass and prominent left axillary lymph nodes, containing biopsy marking clips, consistent with newly diagnosed breast malignancy. 2. No definite evidence of distant metastatic disease in the chest, abdomen, or pelvis. 3. There are occasional small pulmonary nodules, measuring 2-3 mm, most likely incidental sequelae of prior infection or inflammation although metastatic disease is not excluded. Attention on follow-up. 4. Hepatic steatosis. 5. Aortic Atherosclerosis (ICD10-I70.0).     11/21/2019 Imaging   Bone Scan Whole Body IMPRESSION: 1. Single focus of uptake associated with a subacute fracture of LEFT anterior fourth rib. 2. No additional areas of abnormal uptake.   12/26/2019 Cancer Staging   Staging form: Breast, AJCC 8th Edition - Pathologic stage from 12/26/2019: Stage IIIC (pT3, pN3a, cM0, G3, ER-, PR-, HER2-) - Signed by Truitt Merle, MD on 01/28/2020   12/26/2019 Surgery   LEFT MASTECTOMY MODIFIED RADICAL and PAC Placement by Dr Marlou Starks   12/26/2019 Pathology Results   FINAL MICROSCOPIC DIAGNOSIS:   A. 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  - Ductal carcinoma in situ, high nuclear grade with central necrosis.  - Margins of resection:  - Metaplastic carcinoma focally involves the anterior margin and is < 1  mm from the posterior margin.  - DCIS is < 1 mm from the anterior margin.  - Metastatic carcinoma in (13) of (16) lymph nodes with extranodal  extension.  - Biopsy clip sites in breast and one lymph node.  - See oncology table.    ADDENDUM:  PROGNOSTIC INDICATOR RESULTS:  Immunohistochemical and morphometric analysis performed manually  The tumor cells are EQUIVOCAL for Her2 (2+). Her2 FISH has been ordered  and will be reported in an  addendum.  Estrogen Receptor:       NEGATIVE  Progesterone Receptor:   NEGATIVE  Proliferation Marker Ki-67:   30%    ADDENDUM:  FLOURESCENCE IN-SITU HYBRIDIZATION RESULTS:  GROUP 5:   HER2 NEGATIVE   01/28/2020 Echocardiogram   Baseline Echo  IMPRESSIONS     1. Left ventricular ejection fraction, by estimation, is 60 to 65%. The  left ventricle has normal function. The left ventricle has no regional  wall motion abnormalities. Left ventricular diastolic parameters are  consistent with Grade I diastolic  dysfunction (impaired relaxation). The average left ventricular global  longitudinal strain is -19.8 %.   2. Right ventricular systolic function is normal. The right ventricular  size is normal. Tricuspid regurgitation signal is inadequate for assessing  PA pressure.   3. The mitral valve is normal in structure. No evidence of mitral valve  regurgitation. No evidence of mitral stenosis.   4. The aortic valve is normal in structure. Aortic valve regurgitation is  not visualized. No aortic stenosis is present.   5. The inferior vena cava is normal in size with greater than 50%  respiratory variability, suggesting right atrial pressure of 3 mmHg.    02/05/2020 -  Chemotherapy   Keytruda q3weeks (to be taken for 1 whole year) with weekly Carboplatin/Taxol for 12 weeks starting 02/05/20 followed by Adriamycin/Cytoxan q2weeeks      CURRENT THERAPY:  Keytrudaq3weeks(to be taken for 1 whole year)with weeklyCarboplatin/Taxolfor 12 weeksstarting 9/30/21followed  by Adriamycin/Cytoxanq2weeks X4  INTERVAL HISTORY:  Lindsay [name] is here for a follow up. She presents to the clinic alone. She tolerate chemo well last week, noticed some skin peeling on the bottom of left foot No diarrhea, appetite is slightly low, but eats well She lost about 9 lbs since 2 weeks ago, but increased 3lbs ince 3 weeks ago No feer or chills   All other systems were reviewed with the patient and  are negative.  MEDICAL HISTORY:  Past Medical History:  Diagnosis Date  . Bronchitis   . Family history of bone cancer   . Family history of breast cancer   . Family history of prostate cancer   . Fibromyalgia   . HCV (hepatitis C virus)   . MRSA (methicillin resistant Staphylococcus aureus) 2012    SURGICAL HISTORY: Past Surgical History:  Procedure Laterality Date  . MASTECTOMY MODIFIED RADICAL Left 12/26/2019   Procedure: LEFT MASTECTOMY MODIFIED RADICAL;  Surgeon: [name], MD;  Location: [facility];  Service: General;  Laterality: Left;  PEC BLOCK  . PORTACATH PLACEMENT Right 12/26/2019   Procedure: INSERTION PORT-A-CATH WITH ULTRASOUND GUIDANCE;  Surgeon: [name], MD;  Location: [facility];  Service: General;  Laterality: Right;    I have reviewed the social history and family history with the patient and they are unchanged from previous note.  ALLERGIES:  is allergic to codeine.  MEDICATIONS:  Current Outpatient Medications  Medication Sig Dispense Refill  . acetaminophen (TYLENOL) 650 MG CR tablet Take 650 mg by mouth every 8 (eight) hours as needed for pain.     Marland Kitchen amoxicillin-clavulanate (AUGMENTIN) 875-125 MG tablet Take 1 tablet by mouth 2 (two) times daily. 14 tablet 0  . Ascorbic Acid (VITAMIN C PO) Take 1 tablet by mouth daily.    Marland Kitchen dexamethasone (DECADRON) 4 MG tablet Take 2 tablets (8 mg total) by mouth daily. Start the day after carboplatin chemotherapy for 3 days. 30 tablet 1  . diphenoxylate-atropine (LOMOTIL) 2.5-0.025 MG tablet Take 1-2 tablets by mouth 4 (four) times daily as needed for diarrhea or loose stools. 30 tablet 1  . lidocaine-prilocaine (EMLA) cream Apply 1 application topically as needed. 30 g 0  . methocarbamol (ROBAXIN) 750 MG tablet Take 1 tablet (750 mg total) by mouth 4 (four) times daily as needed (use for muscle cramps/pain). 30 tablet 2  . omeprazole (PRILOSEC) 20 MG capsule Take 1 capsule (20 mg total) by mouth daily. 30 capsule 2  .  ondansetron (ZOFRAN) 8 MG tablet Take 1 tablet (8 mg total) by mouth 2 (two) times daily as needed for refractory nausea / vomiting. Start on day 3 after carboplatin chemo. 30 tablet 1  . oxyCODONE (OXY IR/ROXICODONE) 5 MG immediate release tablet Take 1 tablet (5 mg total) by mouth every 6 (six) hours as needed (for pain score of 1-4). 20 tablet 0  . oxyCODONE (OXY IR/ROXICODONE) 5 MG immediate release tablet Take 1-2 tablets (5-10 mg total) by mouth every 6 (six) hours as needed for moderate pain, severe pain or breakthrough pain. 20 tablet 0  . prochlorperazine (COMPAZINE) 10 MG tablet Take 1 tablet (10 mg total) by mouth every 6 (six) hours as needed (Nausea or vomiting). 30 tablet 1   No current facility-administered medications for this visit.    PHYSICAL EXAMINATION: ECOG PERFORMANCE STATUS: 1 - Symptomatic but completely ambulatory  Vitals:   03/11/20 1341  BP: (!) 152/70  Pulse: 78  Resp: 20  Temp: 98.2 F (  36.8 C)  SpO2: 97%   Filed Weights   03/11/20 1341  Weight: 189 lb 6.4 oz (85.9 kg)    GENERAL:alert, no distress and comfortable SKIN: skin color, texture, turgor are normal, no rashes or significant lesions EYES: normal, Conjunctiva are pink and non-injected, sclera clear Musculoskeletal:no cyanosis of digits and no clubbing  NEURO: alert & oriented x 3 with fluent speech, no focal motor/sensory deficits  LABORATORY DATA:  I have reviewed the data as listed CBC Latest Ref Rng & Units 03/11/2020 03/04/2020 02/26/2020  WBC 4.0 - 10.5 K/uL 4.4 5.0 5.2  Hemoglobin 12.0 - 15.0 g/dL 11.1(L) 11.5(L) 11.3(L)  Hematocrit 36 - 46 % 32.0(L) 34.5(L) 33.0(L)  Platelets 150 - 400 K/uL 172 183 249     CMP Latest Ref Rng & Units 03/04/2020 02/26/2020 02/19/2020  Glucose 70 - 99 mg/dL 102(H) 121(H) 110(H)  BUN 8 - 23 mg/dL $Remove'10 9 9  'UBPlffT$ Creatinine 0.44 - 1.00 mg/dL 0.65 0.66 0.80  Sodium 135 - 145 mmol/L 140 137 130(L)  Potassium 3.5 - 5.1 mmol/L 3.9 3.9 3.3(L)  Chloride 98 - 111  mmol/L 108 105 95(L)  CO2 22 - 32 mmol/L $RemoveB'27 22 27  'DZmruRml$ Calcium 8.9 - 10.3 mg/dL 9.1 8.6(L) 9.1  Total Protein 6.5 - 8.1 g/dL 6.0(L) 6.0(L) 6.1(L)  Total Bilirubin 0.3 - 1.2 mg/dL 0.5 <0.2(L) 0.5  Alkaline Phos 38 - 126 U/L 63 67 74  AST 15 - 41 U/L 41 35 35  ALT 0 - 44 U/L $Remo'19 14 12      'kcuXG$ RADIOGRAPHIC STUDIES: I have personally reviewed the radiological images as listed and agreed with the findings in the report. No results found.   ASSESSMENT & PLAN:  Lindsay [name] is a 64 y.o. female with    1.Left breast Multifocal Metaplastic cancer,pT3N3aM0,including one unresectable axillary node, metaplastic carcinoma,ER-/PR-/HER2-, grade IIIC -She was diagnosed in 10/2019 with left breastmetaplasticcancer, triple negative disease metastatic to left axillary LN. Her 11/2019 CT CAP and bone scan was negative for distant metastasis.  -She underwent left mastectomy with Dr Marlou Starks on 12/26/19.Surgical pathshowed6cm multifocal metaplastic carcinomaand13/16 positive LN but unfortunately 1 positive LN was not able to be removed given it invasion ofa vessel. -She startedadjuvant chemo with Keytrudaq3weeks(to be taken for 1 whole year)withweekly Carboplatin/Taxolfor 12 weeksbeginning9/30/21followed by Adriamycin/Cytoxanq2weeksX4 -She developed severe diarrhea after second week of carbo and Taxol, she is tolerating much better now with dose reduction and slow infusion of taxol -Lab reviewed, adequate for treatment, plan to tinea carbo AUC 1.5, low-dose Taxol 60 mg/m weekly  -f/u in 2 weeks  -will give 2 weeks off chemo around Christmas   2. Hepatitis C,untreated,diagnosed in [place] her to ID or [employer] for treatment after she completed chemo. -She also notes a history of MRSA which she is not sure has completed cleared. -We will watch her liver functions closely during the chemotherapy.We discussed her that she may have hepatitis C flare when she has  chemo  3. Social and financial support  -She has no children and no close relatives. She does have close friends who are supportive.  -She did not have medical insurance for many years. She recently applied for blue cross and blue shield which is not effective yet.  4. Mild anemia -Secondary to chemotherapy, mild, will continue monitoring.  PLAN: -Lab reviewed, adequate for treatment, will proceed week 4 carboplatin AUC 1.5, and paclitaxel 60 mg/mtoday with slow infusion (59min) and will continue this dose for the rest of treatment weekly  -  f/u in 2 weeks    No problem-specific Assessment & Plan notes found for this encounter.   No orders of the defined types were placed in this encounter.  All questions were answered. The patient knows to call the clinic with any problems, questions or concerns. No barriers to learning was detected. The total time spent in the appointment was 30 minutes.     Truitt Merle, MD 03/11/2020   I, Joslyn Devon, am acting as scribe for Truitt Merle, MD.   I have reviewed the above documentation for accuracy and completeness, and I agree with the above.

## 2020-03-10 NOTE — Therapy (Signed)
[facility] [place], [place], [address] Phone: [phone]   Fax:  [phone]  Physical Therapy Treatment  Patient Details  Name: Lindsay [name] MRN: [id number] Date of Birth: 02-06-1956 Referring Provider (PT): Dr. Autumn Messing   Encounter Date: 03/10/2020   PT End of Session - 03/10/20 1508    Visit Number 11    Number of Visits 18    Date for PT Re-Evaluation 04/09/20    PT Start Time 1344    PT Stop Time 1431    PT Time Calculation (min) 47 min    Activity Tolerance Patient tolerated treatment well    Behavior During Therapy [facility] for tasks assessed/performed           Past Medical History:  Diagnosis Date  . Bronchitis   . Family history of bone cancer   . Family history of breast cancer   . Family history of prostate cancer   . Fibromyalgia   . HCV (hepatitis C virus)   . MRSA (methicillin resistant Staphylococcus aureus) 2012    Past Surgical History:  Procedure Laterality Date  . MASTECTOMY MODIFIED RADICAL Left 12/26/2019   Procedure: LEFT MASTECTOMY MODIFIED RADICAL;  Surgeon: [name], MD;  Location: [facility];  Service: General;  Laterality: Left;  PEC BLOCK  . PORTACATH PLACEMENT Right 12/26/2019   Procedure: INSERTION PORT-A-CATH WITH ULTRASOUND GUIDANCE;  Surgeon: [name], MD;  Location: [facility];  Service: General;  Laterality: Right;    There were no vitals filed for this visit.   Subjective Assessment - 03/10/20 1347    Subjective COVID-19 screening performed upon arrival. No new complaints.    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 Patient underwent a left modified radical mastectomy on 12/26/2019 with 13 of 16 were positive for cancer. The Ki67 is 10%.    Patient Stated Goals Get my arm better    Currently in Pain? No/denies              [facility] PT Assessment - 03/10/20 0001      Assessment    Medical Diagnosis s/p left MRM    Referring Provider (PT) Dr. Autumn Messing    Onset Date/Surgical Date 12/26/19    Hand Dominance Right    Next MD Visit unsure    Prior Therapy Baselines      Precautions   Precautions Other (comment)    Precaution Comments active cancer,                          [facility] Adult PT Treatment/Exercise - 03/10/20 0001      Shoulder Exercises: Standing   Protraction Strengthening;Left;20 reps;Theraband;10 reps    Theraband Level (Shoulder Protraction) Level 2 (Red)    External Rotation Strengthening;Left;20 reps;Theraband;10 reps    Theraband Level (Shoulder External Rotation) Level 2 (Red)    Internal Rotation Strengthening;Left;20 reps;Theraband;10 reps    Theraband Level (Shoulder Internal Rotation) Level 2 (Red)    Flexion Strengthening;Left;Theraband;10 reps    Theraband Level (Shoulder Flexion) Level 2 (Red)    ABduction AROM;Both;20 reps;10 reps    Extension Strengthening;Left;20 reps;Theraband    Theraband Level (Shoulder Extension) Level 2 (Red)    Other Standing Exercises AAROM behind the back IR, ext  x30    Other Standing Exercises AROM B shoulder scaption x30 reps      Shoulder Exercises: Pulleys   Flexion 5  minutes      Shoulder Exercises: ROM/Strengthening   Wall Pushups 20 reps;10 reps      Shoulder Exercises: Stretch   Internal Rotation Stretch 3 reps    Internal Rotation Stretch Limitations 30 sec holds                       PT Long Term Goals - 03/08/20 1430      PT LONG TERM GOAL #1   Title Patient will demonstrate she has regained full shoulder ROM and function post operatively compared to baselines.    Time 4    Period Weeks    Status Achieved      PT LONG TERM GOAL #2   Title Patient will increase left shoulder active flexion to >/= 130 degrees for increased ease reacing overhead.    Baseline 98 degrees post op; 146 pre-op    Time 4    Period Weeks    Status Achieved      PT LONG TERM  GOAL #3   Title Patient will increase left shoulder active abduction to >/= 130 degrees for ability to obtain radiation positioning.    Baseline 110 degrees post op; 151 degrees pre-op    Time 4    Period Weeks    Status Achieved      PT LONG TERM GOAL #4   Title Patient will improve the Quick DASH score to be </= 10 for improved overall upper extremity function.    Baseline 27.27    Time 4    Period Weeks    Status On-going      PT LONG TERM GOAL #5   Title Patient will verbalize good understanding of lymphedema risk reduction practices after attending the ABC class.    Time 4    Period Weeks    Status On-going                 Plan - 03/10/20 1528    Clinical Impression Statement Patient presented with no new complaints or limitations. Patient guided through more stretching of L shoulder for behind the back activities. Patient denied any fatigue with stengthening activities. Patient required multimodal cueing for exercises correction.    Stability/Clinical Decision Making Stable/Uncomplicated    Rehab Potential Excellent    PT Frequency 2x / week    PT Duration 4 weeks    PT Treatment/Interventions ADLs/Self Care Home Management;Therapeutic exercise;Patient/family education;Manual techniques;Passive range of motion;Scar mobilization;Therapeutic activities    PT Next Visit Plan PROM, myofascial release to left axillary region, AAROM exercises; Avoid UBE, E-stim, ultrasound secondary to cancer/lymphedema precautions    PT Home Exercise Plan Post op shoulder ROM HEP    Consulted and Agree with Plan of Care Patient           Patient will benefit from skilled therapeutic intervention in order to improve the following deficits and impairments:  Postural dysfunction, Decreased range of motion, Decreased knowledge of precautions, Impaired UE functional use, Pain, Increased fascial restricitons, Decreased scar mobility  Visit Diagnosis: Malignant neoplasm of upper-outer  quadrant of left breast in female, estrogen receptor positive ([facility])  Abnormal posture  Stiffness of left shoulder, not elsewhere classified  Pain in left upper arm  Aftercare following surgery for neoplasm     Problem List Patient Active Problem List   Diagnosis Date Noted  . Port-A-Cath in place 02/04/2020  . Family history of breast cancer   . Family history of prostate cancer   . Family  history of bone cancer   . Malignant neoplasm of upper-outer quadrant of left breast in female, estrogen receptor negative ([place]) 10/31/2019    Standley Brooking, PTA 03/10/2020, 3:34 PM  [facility] [address] [place], [place], [address] Phone: [phone]   Fax:  [phone]  Name: Idolina Mantell MRN: [id number] Date of Birth: 06/24/1955

## 2020-03-11 ENCOUNTER — Telehealth: Payer: Self-pay | Admitting: Hematology

## 2020-03-11 ENCOUNTER — Inpatient Hospital Stay (HOSPITAL_BASED_OUTPATIENT_CLINIC_OR_DEPARTMENT_OTHER): Payer: Medicare Other | Admitting: Hematology

## 2020-03-11 ENCOUNTER — Inpatient Hospital Stay: Payer: Medicare Other | Attending: Hematology

## 2020-03-11 ENCOUNTER — Other Ambulatory Visit: Payer: Self-pay

## 2020-03-11 ENCOUNTER — Encounter: Payer: Self-pay | Admitting: *Deleted

## 2020-03-11 ENCOUNTER — Encounter: Payer: Self-pay | Admitting: Hematology

## 2020-03-11 ENCOUNTER — Inpatient Hospital Stay: Payer: Medicare Other

## 2020-03-11 VITALS — BP 152/70 | HR 78 | Temp 98.2°F | Resp 20 | Ht 61.0 in | Wt 189.4 lb

## 2020-03-11 DIAGNOSIS — D701 Agranulocytosis secondary to cancer chemotherapy: Secondary | ICD-10-CM | POA: Insufficient documentation

## 2020-03-11 DIAGNOSIS — Z23 Encounter for immunization: Secondary | ICD-10-CM | POA: Insufficient documentation

## 2020-03-11 DIAGNOSIS — Z79899 Other long term (current) drug therapy: Secondary | ICD-10-CM | POA: Insufficient documentation

## 2020-03-11 DIAGNOSIS — D6481 Anemia due to antineoplastic chemotherapy: Secondary | ICD-10-CM | POA: Insufficient documentation

## 2020-03-11 DIAGNOSIS — Z171 Estrogen receptor negative status [ER-]: Secondary | ICD-10-CM

## 2020-03-11 DIAGNOSIS — Z9012 Acquired absence of left breast and nipple: Secondary | ICD-10-CM | POA: Diagnosis not present

## 2020-03-11 DIAGNOSIS — C50412 Malignant neoplasm of upper-outer quadrant of left female breast: Secondary | ICD-10-CM | POA: Insufficient documentation

## 2020-03-11 DIAGNOSIS — C773 Secondary and unspecified malignant neoplasm of axilla and upper limb lymph nodes: Secondary | ICD-10-CM | POA: Insufficient documentation

## 2020-03-11 DIAGNOSIS — Z5111 Encounter for antineoplastic chemotherapy: Secondary | ICD-10-CM | POA: Diagnosis present

## 2020-03-11 DIAGNOSIS — B1920 Unspecified viral hepatitis C without hepatic coma: Secondary | ICD-10-CM | POA: Diagnosis not present

## 2020-03-11 DIAGNOSIS — Z5112 Encounter for antineoplastic immunotherapy: Secondary | ICD-10-CM | POA: Insufficient documentation

## 2020-03-11 DIAGNOSIS — Z95828 Presence of other vascular implants and grafts: Secondary | ICD-10-CM

## 2020-03-11 LAB — CBC WITH DIFFERENTIAL (CANCER CENTER ONLY)
Abs Immature Granulocytes: 0.02 10*3/uL (ref 0.00–0.07)
Basophils Absolute: 0.1 10*3/uL (ref 0.0–0.1)
Basophils Relative: 1 %
Eosinophils Absolute: 0.3 10*3/uL (ref 0.0–0.5)
Eosinophils Relative: 7 %
HCT: 32 % — ABNORMAL LOW (ref 36.0–46.0)
Hemoglobin: 11.1 g/dL — ABNORMAL LOW (ref 12.0–15.0)
Immature Granulocytes: 1 %
Lymphocytes Relative: 37 %
Lymphs Abs: 1.6 10*3/uL (ref 0.7–4.0)
MCH: 32.7 pg (ref 26.0–34.0)
MCHC: 34.7 g/dL (ref 30.0–36.0)
MCV: 94.4 fL (ref 80.0–100.0)
Monocytes Absolute: 0.4 10*3/uL (ref 0.1–1.0)
Monocytes Relative: 10 %
Neutro Abs: 1.9 10*3/uL (ref 1.7–7.7)
Neutrophils Relative %: 44 %
Platelet Count: 172 10*3/uL (ref 150–400)
RBC: 3.39 MIL/uL — ABNORMAL LOW (ref 3.87–5.11)
RDW: 13.1 % (ref 11.5–15.5)
WBC Count: 4.4 10*3/uL (ref 4.0–10.5)
nRBC: 0 % (ref 0.0–0.2)

## 2020-03-11 LAB — CMP (CANCER CENTER ONLY)
ALT: 16 U/L (ref 0–44)
AST: 41 U/L (ref 15–41)
Albumin: 3.6 g/dL (ref 3.5–5.0)
Alkaline Phosphatase: 65 U/L (ref 38–126)
Anion gap: 4 — ABNORMAL LOW (ref 5–15)
BUN: 9 mg/dL (ref 8–23)
CO2: 27 mmol/L (ref 22–32)
Calcium: 8.7 mg/dL — ABNORMAL LOW (ref 8.9–10.3)
Chloride: 107 mmol/L (ref 98–111)
Creatinine: 0.65 mg/dL (ref 0.44–1.00)
GFR, Estimated: >60 mL/min (ref >60)
Glucose, Bld: 110 mg/dL — ABNORMAL HIGH (ref 70–99)
Potassium: 3.9 mmol/L (ref 3.5–5.1)
Sodium: 138 mmol/L (ref 135–145)
Total Bilirubin: 0.5 mg/dL (ref 0.3–1.2)
Total Protein: 5.9 g/dL — ABNORMAL LOW (ref 6.5–8.1)

## 2020-03-11 MED ORDER — METHYLPREDNISOLONE SODIUM SUCC 40 MG IJ SOLR
20.0000 mg | Freq: Once | INTRAMUSCULAR | Status: AC
  Administered 2020-03-11: 20 mg via INTRAVENOUS

## 2020-03-11 MED ORDER — SODIUM CHLORIDE 0.9 % IV SOLN
60.0000 mg/m2 | Freq: Once | INTRAVENOUS | Status: AC
  Administered 2020-03-11: 114 mg via INTRAVENOUS
  Filled 2020-03-11: qty 19

## 2020-03-11 MED ORDER — SODIUM CHLORIDE 0.9% FLUSH
10.0000 mL | Freq: Once | INTRAVENOUS | Status: AC
  Administered 2020-03-11: 10 mL
  Filled 2020-03-11: qty 10

## 2020-03-11 MED ORDER — LORATADINE 10 MG PO TABS
10.0000 mg | ORAL_TABLET | Freq: Every day | ORAL | Status: DC
  Administered 2020-03-11: 10 mg via ORAL

## 2020-03-11 MED ORDER — SODIUM CHLORIDE 0.9 % IV SOLN
152.5500 mg | Freq: Once | INTRAVENOUS | Status: AC
  Administered 2020-03-11: 150 mg via INTRAVENOUS
  Filled 2020-03-11: qty 15

## 2020-03-11 MED ORDER — PALONOSETRON HCL INJECTION 0.25 MG/5ML
INTRAVENOUS | Status: AC
  Filled 2020-03-11: qty 5

## 2020-03-11 MED ORDER — SODIUM CHLORIDE 0.9% FLUSH
10.0000 mL | INTRAVENOUS | Status: DC | PRN
  Administered 2020-03-11: 10 mL
  Filled 2020-03-11: qty 10

## 2020-03-11 MED ORDER — METHYLPREDNISOLONE SODIUM SUCC 40 MG IJ SOLR
INTRAMUSCULAR | Status: AC
  Filled 2020-03-11: qty 1

## 2020-03-11 MED ORDER — SODIUM CHLORIDE 0.9 % IV SOLN
40.0000 mg | Freq: Once | INTRAVENOUS | Status: AC
  Administered 2020-03-11: 40 mg via INTRAVENOUS
  Filled 2020-03-11: qty 4

## 2020-03-11 MED ORDER — PALONOSETRON HCL INJECTION 0.25 MG/5ML
0.2500 mg | Freq: Once | INTRAVENOUS | Status: AC
  Administered 2020-03-11: 0.25 mg via INTRAVENOUS

## 2020-03-11 MED ORDER — SODIUM CHLORIDE 0.9 % IV SOLN
Freq: Once | INTRAVENOUS | Status: AC
  Filled 2020-03-11: qty 250

## 2020-03-11 MED ORDER — LORATADINE 10 MG PO TABS
ORAL_TABLET | ORAL | Status: AC
  Filled 2020-03-11: qty 1

## 2020-03-11 MED ORDER — HEPARIN SOD (PORK) LOCK FLUSH 100 UNIT/ML IV SOLN
500.0000 [IU] | Freq: Once | INTRAVENOUS | Status: AC | PRN
  Administered 2020-03-11: 500 [IU]
  Filled 2020-03-11: qty 5

## 2020-03-11 NOTE — Progress Notes (Signed)
Per Dr. Burr Medico, Taxol should be infused over 90 minutes.

## 2020-03-11 NOTE — Patient Instructions (Signed)
[facility] °Discharge Instructions for Patients Receiving Chemotherapy ° °Today you received the following chemotherapy agents Taxol; Carboplatin ° °To help prevent nausea and vomiting after your treatment, we encourage you to take your nausea medication as directed °  °If you develop nausea and vomiting that is not controlled by your nausea medication, call the clinic.  ° °BELOW ARE SYMPTOMS THAT SHOULD BE REPORTED IMMEDIATELY: °· *FEVER GREATER THAN 100.5 F °· *CHILLS WITH OR WITHOUT FEVER °· NAUSEA AND VOMITING THAT IS NOT CONTROLLED WITH YOUR NAUSEA MEDICATION °· *UNUSUAL SHORTNESS OF BREATH °· *UNUSUAL BRUISING OR BLEEDING °· TENDERNESS IN MOUTH AND THROAT WITH OR WITHOUT PRESENCE OF ULCERS °· *URINARY PROBLEMS °· *BOWEL PROBLEMS °· UNUSUAL RASH °Items with * indicate a potential emergency and should be followed up as soon as possible. ° °Feel free to call the clinic should you have any questions or concerns. The clinic phone number is [phone]. ° °Please show the CHEMO ALERT CARD at check-in to the Emergency Department and triage nurse. ° ° °

## 2020-03-11 NOTE — Progress Notes (Signed)
Met with patient at registration to introduce myself as Arboriculturist and to offer available resources.  Discussed one-time $1000 Radio broadcast assistant to assist with personal expenses while going through treatment. Also discussed [place] assistance for patients whom live in [facility] and provided application if interested in applying. She states she donates to them.  Gave her my card for any additional financial questions or concerns.

## 2020-03-11 NOTE — Patient Instructions (Signed)

## 2020-03-11 NOTE — Telephone Encounter (Signed)
Scheduled per 11/4 los. Noted to give pt appt calendar.

## 2020-03-15 ENCOUNTER — Ambulatory Visit: Payer: Medicare Other | Admitting: Physical Therapy

## 2020-03-15 ENCOUNTER — Encounter: Payer: Self-pay | Admitting: Physical Therapy

## 2020-03-15 ENCOUNTER — Other Ambulatory Visit: Payer: Self-pay

## 2020-03-15 DIAGNOSIS — Z483 Aftercare following surgery for neoplasm: Secondary | ICD-10-CM

## 2020-03-15 DIAGNOSIS — M79622 Pain in left upper arm: Secondary | ICD-10-CM

## 2020-03-15 DIAGNOSIS — M25612 Stiffness of left shoulder, not elsewhere classified: Secondary | ICD-10-CM

## 2020-03-15 DIAGNOSIS — C50412 Malignant neoplasm of upper-outer quadrant of left female breast: Secondary | ICD-10-CM

## 2020-03-15 DIAGNOSIS — R293 Abnormal posture: Secondary | ICD-10-CM

## 2020-03-15 DIAGNOSIS — Z170 Estrogen receptor positive status [ER+]: Secondary | ICD-10-CM

## 2020-03-15 NOTE — Therapy (Signed)
[facility] [place], [place], [address] Phone: [phone]   Fax:  [phone]  Physical Therapy Treatment  Patient Details  Name: Lindsay [name] MRN: [id number] Date of Birth: 29-Jun-1955 Referring Provider (PT): Dr. Autumn Messing   Encounter Date: 03/15/2020   PT End of Session - 03/15/20 1351    Visit Number 12    Number of Visits 18    Date for PT Re-Evaluation 04/09/20    PT Start Time 2505    PT Stop Time 1432    PT Time Calculation (min) 47 min    Activity Tolerance Patient tolerated treatment well    Behavior During Therapy [facility] for tasks assessed/performed           Past Medical History:  Diagnosis Date  . Bronchitis   . Family history of bone cancer   . Family history of breast cancer   . Family history of prostate cancer   . Fibromyalgia   . HCV (hepatitis C virus)   . MRSA (methicillin resistant Staphylococcus aureus) 2012    Past Surgical History:  Procedure Laterality Date  . MASTECTOMY MODIFIED RADICAL Left 12/26/2019   Procedure: LEFT MASTECTOMY MODIFIED RADICAL;  Surgeon: [name], MD;  Location: [place];  Service: General;  Laterality: Left;  PEC BLOCK  . PORTACATH PLACEMENT Right 12/26/2019   Procedure: INSERTION PORT-A-CATH WITH ULTRASOUND GUIDANCE;  Surgeon: [name], MD;  Location: [place];  Service: General;  Laterality: Right;    There were no vitals filed for this visit.       [facility] PT Assessment - 03/15/20 0001      Assessment   Medical Diagnosis s/p left MRM    Referring Provider (PT) Dr. Autumn Messing    Onset Date/Surgical Date 12/26/19    Hand Dominance Right    Next MD Visit unsure    Prior Therapy Baselines                         [facility] Adult PT Treatment/Exercise - 03/15/20 0001      Shoulder Exercises: Pulleys   Flexion 5 minutes      Shoulder Exercises: ROM/Strengthening   Ranger standing ranger CW, CCW circles x3 min each      Manual  Therapy   Manual Therapy Passive ROM;Myofascial release;Scapular mobilization    Myofascial Release Myofasical release to scar and lats/teres minor with low load long duration stretch to improve ABD and ER; in sidelying MFR to medial scapular border to inferior angle and UT    Passive ROM PROM to left shoulder into flexion, ER, and abd; rhythmic stabilization at 45, 90 and 120 degrees; shoulder in 90 abd for IR and ER rhythmic stabilization. D2 PNF for strengthening with resistance through  motion                       PT Long Term Goals - 03/08/20 1430      PT LONG TERM GOAL #1   Title Patient will demonstrate she has regained full shoulder ROM and function post operatively compared to baselines.    Time 4    Period Weeks    Status Achieved      PT LONG TERM GOAL #2   Title Patient will increase left shoulder active flexion to >/= 130 degrees for increased ease reacing overhead.    Baseline 98 degrees post op; 146 pre-op    Time 4  Period Weeks    Status Achieved      PT LONG TERM GOAL #3   Title Patient will increase left shoulder active abduction to >/= 130 degrees for ability to obtain radiation positioning.    Baseline 110 degrees post op; 151 degrees pre-op    Time 4    Period Weeks    Status Achieved      PT LONG TERM GOAL #4   Title Patient will improve the Quick DASH score to be </= 10 for improved overall upper extremity function.    Baseline 27.27    Time 4    Period Weeks    Status On-going      PT LONG TERM GOAL #5   Title Patient will verbalize good understanding of lymphedema risk reduction practices after attending the ABC class.    Time 4    Period Weeks    Status On-going                 Plan - 03/15/20 1531    Clinical Impression Statement Patient responded well to therapy session with just reports of tightness in left axillary region. Patient's scar is well healed but still with areas of increased scar tissue. Patient and PT  discussed transitioning to more strength based exercises as ROM is WFL. Patient reported understanding.    Stability/Clinical Decision Making Stable/Uncomplicated    Clinical Decision Making Low    Rehab Potential Excellent    PT Frequency 2x / week    PT Duration 4 weeks    PT Treatment/Interventions ADLs/Self Care Home Management;Therapeutic exercise;Patient/family education;Manual techniques;Passive range of motion;Scar mobilization;Therapeutic activities    PT Next Visit Plan PROM, myofascial release to left axillary region, AAROM exercises; Avoid UBE, E-stim, ultrasound secondary to cancer/lymphedema precautions    PT Home Exercise Plan Post op shoulder ROM HEP    Consulted and Agree with Plan of Care Patient           Patient will benefit from skilled therapeutic intervention in order to improve the following deficits and impairments:  Postural dysfunction, Decreased range of motion, Decreased knowledge of precautions, Impaired UE functional use, Pain, Increased fascial restricitons, Decreased scar mobility  Visit Diagnosis: Malignant neoplasm of upper-outer quadrant of left breast in female, estrogen receptor positive (HCC)  Abnormal posture  Stiffness of left shoulder, not elsewhere classified  Pain in left upper arm  Aftercare following surgery for neoplasm     Problem List Patient Active Problem List   Diagnosis Date Noted  . Port-A-Cath in place 02/04/2020  . Family history of breast cancer   . Family history of prostate cancer   . Family history of bone cancer   . Malignant neoplasm of upper-outer quadrant of left breast in female, estrogen receptor negative ([place]) 10/31/2019    Lindsay [name], PT, DPT 03/15/2020, 3:36 PM  [facility] [place], [place], [address] Phone: [phone]   Fax:  [phone]  Name: Lindsay [name] MRN: [id number] Date of Birth: 07-07-1955

## 2020-03-17 ENCOUNTER — Inpatient Hospital Stay: Payer: Medicare Other

## 2020-03-17 ENCOUNTER — Other Ambulatory Visit: Payer: Self-pay

## 2020-03-17 ENCOUNTER — Ambulatory Visit: Payer: Medicare Other | Admitting: Physical Therapy

## 2020-03-17 DIAGNOSIS — Z5111 Encounter for antineoplastic chemotherapy: Secondary | ICD-10-CM | POA: Diagnosis not present

## 2020-03-17 DIAGNOSIS — Z170 Estrogen receptor positive status [ER+]: Secondary | ICD-10-CM

## 2020-03-17 DIAGNOSIS — R293 Abnormal posture: Secondary | ICD-10-CM

## 2020-03-17 DIAGNOSIS — C50412 Malignant neoplasm of upper-outer quadrant of left female breast: Secondary | ICD-10-CM

## 2020-03-17 DIAGNOSIS — M79622 Pain in left upper arm: Secondary | ICD-10-CM

## 2020-03-17 DIAGNOSIS — M25612 Stiffness of left shoulder, not elsewhere classified: Secondary | ICD-10-CM

## 2020-03-17 DIAGNOSIS — Z483 Aftercare following surgery for neoplasm: Secondary | ICD-10-CM

## 2020-03-17 LAB — CMP (CANCER CENTER ONLY)
ALT: 18 U/L (ref 0–44)
AST: 42 U/L — ABNORMAL HIGH (ref 15–41)
Albumin: 3.8 g/dL (ref 3.5–5.0)
Alkaline Phosphatase: 60 U/L (ref 38–126)
Anion gap: 8 (ref 5–15)
BUN: 5 mg/dL — ABNORMAL LOW (ref 8–23)
CO2: 26 mmol/L (ref 22–32)
Calcium: 8.7 mg/dL — ABNORMAL LOW (ref 8.9–10.3)
Chloride: 105 mmol/L (ref 98–111)
Creatinine: 0.65 mg/dL (ref 0.44–1.00)
GFR, Estimated: >60 mL/min (ref >60)
Glucose, Bld: 106 mg/dL — ABNORMAL HIGH (ref 70–99)
Potassium: 3.7 mmol/L (ref 3.5–5.1)
Sodium: 139 mmol/L (ref 135–145)
Total Bilirubin: 0.8 mg/dL (ref 0.3–1.2)
Total Protein: 6 g/dL — ABNORMAL LOW (ref 6.5–8.1)

## 2020-03-17 LAB — CBC WITH DIFFERENTIAL (CANCER CENTER ONLY)
Abs Immature Granulocytes: 0.03 10*3/uL (ref 0.00–0.07)
Basophils Absolute: 0 10*3/uL (ref 0.0–0.1)
Basophils Relative: 1 %
Eosinophils Absolute: 0.1 10*3/uL (ref 0.0–0.5)
Eosinophils Relative: 4 %
HCT: 32.9 % — ABNORMAL LOW (ref 36.0–46.0)
Hemoglobin: 11.4 g/dL — ABNORMAL LOW (ref 12.0–15.0)
Immature Granulocytes: 1 %
Lymphocytes Relative: 48 %
Lymphs Abs: 1.5 10*3/uL (ref 0.7–4.0)
MCH: 32.6 pg (ref 26.0–34.0)
MCHC: 34.7 g/dL (ref 30.0–36.0)
MCV: 94 fL (ref 80.0–100.0)
Monocytes Absolute: 0.3 10*3/uL (ref 0.1–1.0)
Monocytes Relative: 8 %
Neutro Abs: 1.2 10*3/uL — ABNORMAL LOW (ref 1.7–7.7)
Neutrophils Relative %: 38 %
Platelet Count: 186 10*3/uL (ref 150–400)
RBC: 3.5 MIL/uL — ABNORMAL LOW (ref 3.87–5.11)
RDW: 13.1 % (ref 11.5–15.5)
WBC Count: 3.2 10*3/uL — ABNORMAL LOW (ref 4.0–10.5)
nRBC: 0 % (ref 0.0–0.2)

## 2020-03-17 NOTE — Therapy (Signed)
[facility] [place], [place], [address] Phone: [phone]   Fax:  [phone]  Physical Therapy Treatment  Patient Details  Name: Lindsay [name] MRN: [id number] Date of Birth: 06/05/1955 Referring Provider (PT): Dr. Autumn Messing   Encounter Date: 03/17/2020   PT End of Session - 03/17/20 1829    Visit Number 13    Number of Visits 18    Date for PT Re-Evaluation 04/09/20    PT Start Time 3382    PT Stop Time 1430    PT Time Calculation (min) 45 min    Activity Tolerance Patient tolerated treatment well    Behavior During Therapy [facility] for tasks assessed/performed           Past Medical History:  Diagnosis Date  . Bronchitis   . Family history of bone cancer   . Family history of breast cancer   . Family history of prostate cancer   . Fibromyalgia   . HCV (hepatitis C virus)   . MRSA (methicillin resistant Staphylococcus aureus) 2012    Past Surgical History:  Procedure Laterality Date  . MASTECTOMY MODIFIED RADICAL Left 12/26/2019   Procedure: LEFT MASTECTOMY MODIFIED RADICAL;  Surgeon: [name], MD;  Location: [place];  Service: General;  Laterality: Left;  PEC BLOCK  . PORTACATH PLACEMENT Right 12/26/2019   Procedure: INSERTION PORT-A-CATH WITH ULTRASOUND GUIDANCE;  Surgeon: [name], MD;  Location: [facility];  Service: General;  Laterality: Right;    There were no vitals filed for this visit.   Subjective Assessment - 03/17/20 1353    Subjective COVID-19 screening performed upon arrival. Patient reported arm feeling good but stated having cracks in her feet that limits her standing tolerance.    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 Patient underwent a left modified radical mastectomy on 12/26/2019 with 13 of 16 were positive for cancer. The Ki67 is 10%.    Patient Stated Goals Get my arm better    Currently in Pain?  No/denies              [facility] PT Assessment - 03/17/20 0001      Assessment   Medical Diagnosis s/p left MRM    Referring Provider (PT) Dr. Autumn Messing    Onset Date/Surgical Date 12/26/19    Hand Dominance Right    Next MD Visit unsure    Prior Therapy Baselines                         [facility] Adult PT Treatment/Exercise - 03/17/20 0001      Exercises   Exercises Shoulder;Elbow      Elbow Exercises   Elbow Flexion Strengthening;Left;20 reps;Seated;10 reps    Bar Weights/Barbell (Elbow Flexion) 1 lb    Elbow Extension Strengthening;Left;20 reps;Seated;10 reps    Bar Weights/Barbell (Elbow Extension) 1 lb    Other elbow exercises hammer curls 1# x30      Shoulder Exercises: Seated   Extension Strengthening;Both;20 reps;Theraband;10 reps    Theraband Level (Shoulder Extension) Level 2 (Red)    Row Strengthening;Both;20 reps;Theraband;10 reps    Theraband Level (Shoulder Row) Level 2 (Red)    Other Seated Exercises D2 flexion red theraband x30 folllowed by D1 extension red theraband x30      Shoulder Exercises: Pulleys   Flexion 5 minutes      Manual Therapy   Manual Therapy Myofascial  release    Myofascial Release Myofasical release to scar and lats/teres minor with low load long duration stretch into flexion                       PT Long Term Goals - 03/08/20 1430      PT LONG TERM GOAL #1   Title Patient will demonstrate she has regained full shoulder ROM and function post operatively compared to baselines.    Time 4    Period Weeks    Status Achieved      PT LONG TERM GOAL #2   Title Patient will increase left shoulder active flexion to >/= 130 degrees for increased ease reacing overhead.    Baseline 98 degrees post op; 146 pre-op    Time 4    Period Weeks    Status Achieved      PT LONG TERM GOAL #3   Title Patient will increase left shoulder active abduction to >/= 130 degrees for ability to obtain radiation positioning.     Baseline 110 degrees post op; 151 degrees pre-op    Time 4    Period Weeks    Status Achieved      PT LONG TERM GOAL #4   Title Patient will improve the Quick DASH score to be </= 10 for improved overall upper extremity function.    Baseline 27.27    Time 4    Period Weeks    Status On-going      PT LONG TERM GOAL #5   Title Patient will verbalize good understanding of lymphedema risk reduction practices after attending the ABC class.    Time 4    Period Weeks    Status On-going                 Plan - 03/17/20 1829    Clinical Impression Statement Patient arrived to physical therapy requesting to perform mainly seated exercises secondary to cuts on her feet and pain/pressure when standing. Patient guided though seated exercises for elbow and shoulder strengthening avoiding shoulder horizontal abduction to R side due to port. Patient demonstrated good form with all exercises after explanation. STW/M and myofascial release performed to teres minor/lats region with arm flexed with excellent response. Patient provided with new HEP to which she reported understanding.    Stability/Clinical Decision Making Stable/Uncomplicated    Clinical Decision Making Low    Rehab Potential Excellent    PT Frequency 2x / week    PT Duration 4 weeks    PT Treatment/Interventions ADLs/Self Care Home Management;Therapeutic exercise;Patient/family education;Manual techniques;Passive range of motion;Scar mobilization;Therapeutic activities    PT Next Visit Plan PROM, myofascial release to left axillary region, AAROM exercises; Avoid UBE, E-stim, ultrasound secondary to cancer/lymphedema precautions    PT Home Exercise Plan Post op shoulder ROM HEP    Consulted and Agree with Plan of Care Patient           Patient will benefit from skilled therapeutic intervention in order to improve the following deficits and impairments:  Postural dysfunction, Decreased range of motion, Decreased knowledge of  precautions, Impaired UE functional use, Pain, Increased fascial restricitons, Decreased scar mobility  Visit Diagnosis: Malignant neoplasm of upper-outer quadrant of left breast in female, estrogen receptor positive (HCC)  Abnormal posture  Stiffness of left shoulder, not elsewhere classified  Pain in left upper arm  Aftercare following surgery for neoplasm     Problem List Patient Active Problem List   Diagnosis  Date Noted  . Port-A-Cath in place 02/04/2020  . Family history of breast cancer   . Family history of prostate cancer   . Family history of bone cancer   . Malignant neoplasm of upper-outer quadrant of left breast in female, estrogen receptor negative ([place]) 10/31/2019    Gabriela Eves, PT, DPT 03/17/2020, 6:33 PM  [facility] [place], [place], [address] Phone: [phone]   Fax:  [phone]  Name: Lindsay [name] MRN: [id number] Date of Birth: [birth year]

## 2020-03-18 ENCOUNTER — Other Ambulatory Visit: Payer: Self-pay | Admitting: Hematology

## 2020-03-18 ENCOUNTER — Encounter: Payer: Self-pay | Admitting: Hematology

## 2020-03-18 ENCOUNTER — Inpatient Hospital Stay: Payer: Medicare Other

## 2020-03-18 ENCOUNTER — Other Ambulatory Visit: Payer: Self-pay

## 2020-03-18 VITALS — BP 132/91 | HR 89 | Temp 98.6°F | Resp 18

## 2020-03-18 DIAGNOSIS — Z23 Encounter for immunization: Secondary | ICD-10-CM

## 2020-03-18 DIAGNOSIS — C50412 Malignant neoplasm of upper-outer quadrant of left female breast: Secondary | ICD-10-CM

## 2020-03-18 DIAGNOSIS — Z5111 Encounter for antineoplastic chemotherapy: Secondary | ICD-10-CM | POA: Diagnosis not present

## 2020-03-18 MED ORDER — SODIUM CHLORIDE 0.9 % IV SOLN
152.5500 mg | Freq: Once | INTRAVENOUS | Status: AC
  Administered 2020-03-18: 150 mg via INTRAVENOUS
  Filled 2020-03-18: qty 15

## 2020-03-18 MED ORDER — METHYLPREDNISOLONE SODIUM SUCC 40 MG IJ SOLR
INTRAMUSCULAR | Status: AC
  Filled 2020-03-18: qty 1

## 2020-03-18 MED ORDER — HEPARIN SOD (PORK) LOCK FLUSH 100 UNIT/ML IV SOLN
500.0000 [IU] | Freq: Once | INTRAVENOUS | Status: AC | PRN
  Administered 2020-03-18: 500 [IU]
  Filled 2020-03-18: qty 5

## 2020-03-18 MED ORDER — LORATADINE 10 MG PO TABS
ORAL_TABLET | ORAL | Status: AC
  Filled 2020-03-18: qty 1

## 2020-03-18 MED ORDER — METHYLPREDNISOLONE SODIUM SUCC 40 MG IJ SOLR
20.0000 mg | Freq: Once | INTRAMUSCULAR | Status: AC
  Administered 2020-03-18: 20 mg via INTRAVENOUS

## 2020-03-18 MED ORDER — INFLUENZA VAC A&B SA ADJ QUAD 0.5 ML IM PRSY
0.5000 mL | PREFILLED_SYRINGE | Freq: Once | INTRAMUSCULAR | Status: AC
  Administered 2020-03-18: 0.5 mL via INTRAMUSCULAR

## 2020-03-18 MED ORDER — INFLUENZA VAC A&B SA ADJ QUAD 0.5 ML IM PRSY
PREFILLED_SYRINGE | INTRAMUSCULAR | Status: AC
  Filled 2020-03-18: qty 0.5

## 2020-03-18 MED ORDER — PALONOSETRON HCL INJECTION 0.25 MG/5ML
0.2500 mg | Freq: Once | INTRAVENOUS | Status: AC
  Administered 2020-03-18: 0.25 mg via INTRAVENOUS

## 2020-03-18 MED ORDER — PALONOSETRON HCL INJECTION 0.25 MG/5ML
INTRAVENOUS | Status: AC
  Filled 2020-03-18: qty 5

## 2020-03-18 MED ORDER — SODIUM CHLORIDE 0.9 % IV SOLN
200.0000 mg | Freq: Once | INTRAVENOUS | Status: AC
  Administered 2020-03-18: 200 mg via INTRAVENOUS
  Filled 2020-03-18: qty 8

## 2020-03-18 MED ORDER — SODIUM CHLORIDE 0.9 % IV SOLN
40.0000 mg | Freq: Once | INTRAVENOUS | Status: AC
  Administered 2020-03-18: 40 mg via INTRAVENOUS
  Filled 2020-03-18: qty 4

## 2020-03-18 MED ORDER — SODIUM CHLORIDE 0.9 % IV SOLN
60.0000 mg/m2 | Freq: Once | INTRAVENOUS | Status: AC
  Administered 2020-03-18: 114 mg via INTRAVENOUS
  Filled 2020-03-18: qty 19

## 2020-03-18 MED ORDER — SODIUM CHLORIDE 0.9 % IV SOLN
Freq: Once | INTRAVENOUS | Status: AC
  Filled 2020-03-18: qty 250

## 2020-03-18 MED ORDER — LORATADINE 10 MG PO TABS
10.0000 mg | ORAL_TABLET | Freq: Every day | ORAL | Status: DC
  Administered 2020-03-18: 10 mg via ORAL

## 2020-03-18 MED ORDER — SODIUM CHLORIDE 0.9% FLUSH
10.0000 mL | INTRAVENOUS | Status: DC | PRN
  Administered 2020-03-18: 10 mL
  Filled 2020-03-18: qty 10

## 2020-03-18 NOTE — Progress Notes (Signed)
Per Dr. Burr Medico, Lindsay [name] to treat with Pinon 1.2.

## 2020-03-18 NOTE — Patient Instructions (Signed)
[facility] Discharge Instructions for Patients Receiving Chemotherapy  Today you received the following chemotherapy agents Taxol, Carboplatin  To help prevent nausea and vomiting after your treatment, we encourage you to take your nausea medication as directed  If you develop nausea and vomiting that is not controlled by your nausea medication, call the clinic.   BELOW ARE SYMPTOMS THAT SHOULD BE REPORTED IMMEDIATELY:  *FEVER GREATER THAN 100.5 F  *CHILLS WITH OR WITHOUT FEVER  NAUSEA AND VOMITING THAT IS NOT CONTROLLED WITH YOUR NAUSEA MEDICATION  *UNUSUAL SHORTNESS OF BREATH  *UNUSUAL BRUISING OR BLEEDING  TENDERNESS IN MOUTH AND THROAT WITH OR WITHOUT PRESENCE OF ULCERS  *URINARY PROBLEMS  *BOWEL PROBLEMS  UNUSUAL RASH Items with * indicate a potential emergency and should be followed up as soon as possible.  Feel free to call the clinic should you have any questions or concerns. The clinic phone number is [phone].  Please show the CHEMO ALERT CARD at check-in to the Emergency Department and triage nurse.   

## 2020-03-19 ENCOUNTER — Other Ambulatory Visit: Payer: Self-pay

## 2020-03-22 ENCOUNTER — Other Ambulatory Visit: Payer: Self-pay

## 2020-03-22 ENCOUNTER — Ambulatory Visit: Payer: Medicare Other | Admitting: Physical Therapy

## 2020-03-22 ENCOUNTER — Telehealth: Payer: Self-pay

## 2020-03-22 ENCOUNTER — Encounter: Payer: Self-pay | Admitting: Physical Therapy

## 2020-03-22 ENCOUNTER — Inpatient Hospital Stay: Payer: Medicare Other

## 2020-03-22 VITALS — BP 128/75 | HR 87 | Temp 98.0°F | Resp 18

## 2020-03-22 DIAGNOSIS — Z5111 Encounter for antineoplastic chemotherapy: Secondary | ICD-10-CM | POA: Diagnosis not present

## 2020-03-22 DIAGNOSIS — M79622 Pain in left upper arm: Secondary | ICD-10-CM

## 2020-03-22 DIAGNOSIS — C50412 Malignant neoplasm of upper-outer quadrant of left female breast: Secondary | ICD-10-CM | POA: Diagnosis not present

## 2020-03-22 DIAGNOSIS — M25612 Stiffness of left shoulder, not elsewhere classified: Secondary | ICD-10-CM

## 2020-03-22 DIAGNOSIS — Z95828 Presence of other vascular implants and grafts: Secondary | ICD-10-CM

## 2020-03-22 DIAGNOSIS — Z170 Estrogen receptor positive status [ER+]: Secondary | ICD-10-CM

## 2020-03-22 DIAGNOSIS — R293 Abnormal posture: Secondary | ICD-10-CM

## 2020-03-22 DIAGNOSIS — Z483 Aftercare following surgery for neoplasm: Secondary | ICD-10-CM

## 2020-03-22 MED ORDER — FILGRASTIM-AAFI 480 MCG/0.8ML IJ SOSY
480.0000 ug | PREFILLED_SYRINGE | Freq: Once | INTRAMUSCULAR | Status: AC
  Administered 2020-03-22: 480 ug via SUBCUTANEOUS
  Filled 2020-03-22: qty 0.8

## 2020-03-22 NOTE — Patient Instructions (Signed)

## 2020-03-22 NOTE — Therapy (Signed)
[facility] [place], [place], [address] Phone: [phone]   Fax:  [phone]  Physical Therapy Treatment  Patient Details  Name: Lindsay [name] MRN: [id number] Date of Birth: [birth year] Referring Provider (PT): Dr. Autumn Messing   Encounter Date: 03/22/2020   PT End of Session - 03/22/20 1351    Visit Number 14    Number of Visits 18    Date for PT Re-Evaluation 04/09/20    PT Start Time 6962    PT Stop Time 1430    PT Time Calculation (min) 42 min    Activity Tolerance Patient tolerated treatment well    Behavior During Therapy [facility] for tasks assessed/performed           Past Medical History:  Diagnosis Date  . Bronchitis   . Family history of bone cancer   . Family history of breast cancer   . Family history of prostate cancer   . Fibromyalgia   . HCV (hepatitis C virus)   . MRSA (methicillin resistant Staphylococcus aureus) 2012    Past Surgical History:  Procedure Laterality Date  . MASTECTOMY MODIFIED RADICAL Left 12/26/2019   Procedure: LEFT MASTECTOMY MODIFIED RADICAL;  Surgeon: [name], MD;  Location: [place];  Service: General;  Laterality: Left;  PEC BLOCK  . PORTACATH PLACEMENT Right 12/26/2019   Procedure: INSERTION PORT-A-CATH WITH ULTRASOUND GUIDANCE;  Surgeon: [name], MD;  Location: [place];  Service: General;  Laterality: Right;    There were no vitals filed for this visit.   Subjective Assessment - 03/22/20 1350    Subjective COVID-19 screening performed upon arrival. Patient reports her arm is good but has to go get another shot.    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 Patient underwent a left modified radical mastectomy on 12/26/2019 with 13 of 16 were positive for cancer. The Ki67 is 10%.    Patient Stated Goals Get my arm better    Currently in Pain? No/denies              [facility] PT  Assessment - 03/22/20 0001      Assessment   Medical Diagnosis s/p left MRM    Referring Provider (PT) Dr. Autumn Messing    Onset Date/Surgical Date 12/26/19    Hand Dominance Right    Next MD Visit unsure    Prior Therapy Baselines      Precautions   Precautions Other (comment)    Precaution Comments active cancer,       Restrictions   Weight Bearing Restrictions No      ROM / Strength   AROM / PROM / Strength AROM      AROM   Overall AROM  Within functional limits for tasks performed    AROM Assessment Site Shoulder    Right/Left Shoulder Left    Right Shoulder Flexion 155 Degrees                         [facility] Adult PT Treatment/Exercise - 03/22/20 0001      Shoulder Exercises: Standing   Protraction Strengthening;Left;20 reps;Theraband;10 reps    Theraband Level (Shoulder Protraction) Level 2 (Red)    Horizontal ABduction Strengthening;Both;20 reps;10 reps;Theraband    Theraband Level (Shoulder Horizontal ABduction) Level 1 (Yellow)    External Rotation Strengthening;Left;20 reps;Theraband;10 reps    Theraband Level (Shoulder External Rotation) Level 2 (Red)  Internal Rotation Strengthening;Left;20 reps;Theraband;10 reps    Theraband Level (Shoulder Internal Rotation) Level 2 (Red)    Flexion Strengthening;Left;20 reps;Theraband    Theraband Level (Shoulder Flexion) Level 2 (Red)    ABduction Strengthening;Left;20 reps;Theraband    Theraband Level (Shoulder ABduction) Level 2 (Red)    Extension Strengthening;Left;20 reps;Theraband    Theraband Level (Shoulder Extension) Level 2 (Red)    Diagonals Strengthening;Left;20 reps;10 reps;Theraband    Theraband Level (Shoulder Diagonals) Level 1 (Yellow)      Shoulder Exercises: Pulleys   Flexion 5 minutes      Shoulder Exercises: ROM/Strengthening   Wall Wash into flex x20 reps, abduct x20 reps with hold at end range    Wall Pushups 20 reps;10 reps                       PT Long Term Goals  - 03/08/20 1430      PT LONG TERM GOAL #1   Title Patient will demonstrate she has regained full shoulder ROM and function post operatively compared to baselines.    Time 4    Period Weeks    Status Achieved      PT LONG TERM GOAL #2   Title Patient will increase left shoulder active flexion to >/= 130 degrees for increased ease reacing overhead.    Baseline 98 degrees post op; 146 pre-op    Time 4    Period Weeks    Status Achieved      PT LONG TERM GOAL #3   Title Patient will increase left shoulder active abduction to >/= 130 degrees for ability to obtain radiation positioning.    Baseline 110 degrees post op; 151 degrees pre-op    Time 4    Period Weeks    Status Achieved      PT LONG TERM GOAL #4   Title Patient will improve the Quick DASH score to be </= 10 for improved overall upper extremity function.    Baseline 27.27    Time 4    Period Weeks    Status On-going      PT LONG TERM GOAL #5   Title Patient will verbalize good understanding of lymphedema risk reduction practices after attending the ABC class.    Time 4    Period Weeks    Status On-going                 Plan - 03/22/20 1528    Clinical Impression Statement Patient presented in clinic with no complaints of pain or limitations. Patient able to tolerate all stretching and strengthening without any complaint of pain. Patient's R shoulder horizontal abduction limited due to port placement. Patient able to demonstrate fairly good technique with all therex with multimodal cueing.    Stability/Clinical Decision Making Stable/Uncomplicated    Rehab Potential Excellent    PT Frequency 2x / week    PT Duration 4 weeks    PT Treatment/Interventions ADLs/Self Care Home Management;Therapeutic exercise;Patient/family education;Manual techniques;Passive range of motion;Scar mobilization;Therapeutic activities    PT Next Visit Plan PROM, myofascial release to left axillary region, AAROM exercises; Avoid UBE,  E-stim, ultrasound secondary to cancer/lymphedema precautions    PT Home Exercise Plan Post op shoulder ROM HEP    Consulted and Agree with Plan of Care Patient           Patient will benefit from skilled therapeutic intervention in order to improve the following deficits and impairments:  Postural dysfunction, Decreased range  of motion, Decreased knowledge of precautions, Impaired UE functional use, Pain, Increased fascial restricitons, Decreased scar mobility  Visit Diagnosis: Malignant neoplasm of upper-outer quadrant of left breast in female, estrogen receptor positive (HCC)  Abnormal posture  Stiffness of left shoulder, not elsewhere classified  Pain in left upper arm  Aftercare following surgery for neoplasm     Problem List Patient Active Problem List   Diagnosis Date Noted  . Port-A-Cath in place 02/04/2020  . Family history of breast cancer   . Family history of prostate cancer   . Family history of bone cancer   . Malignant neoplasm of upper-outer quadrant of left breast in female, estrogen receptor negative ([place]) 10/31/2019    Standley Brooking, PTA 03/22/2020, 3:35 PM  [facility] [address] [place], [place], [address] Phone: [phone]   Fax:  [phone]  Name: Lindsay [name] MRN: [id number] Date of Birth: 01-04-1956

## 2020-03-22 NOTE — Telephone Encounter (Signed)
I left vm for Lindsay [name] to call me.  I let her know we have received the PA for the nivestym and Dr Burr Medico wants her to come in today for the injection.

## 2020-03-22 NOTE — Progress Notes (Signed)
[place]   Telephone:([phone] Fax:([phone]   Clinic Follow up Note   Patient Care Team: Scotty Court, DO as PCP - General Mauro Kaufmann, RN as Oncology Nurse Navigator Rockwell Germany, RN as Oncology Nurse Navigator [name], MD as Consulting Physician (General Surgery) Truitt Merle, MD as Consulting Physician (Hematology) Eppie Gibson, MD as Attending Physician (Radiation Oncology)  Date of Service:  03/25/2020  CHIEF COMPLAINT: F/u of left breastmetaplasticcancer, triple negative   SUMMARY OF ONCOLOGIC HISTORY: Oncology History Overview Note  Cancer Staging Malignant neoplasm of upper-outer quadrant of left breast in female, estrogen receptor negative ([place]) Staging form: Breast, AJCC 8th Edition - Clinical stage from 10/28/2019: Stage IIIB (cT2, cN1, cM0, G3, ER-, PR-, HER2-) - Signed by Eppie Gibson, MD on 11/05/2019 - Pathologic stage from 12/26/2019: Stage IIIC (pT3, pN3a, cM0, G3, ER-, PR-, HER2-) - Signed by Truitt Merle, MD on 01/28/2020    Malignant neoplasm of upper-outer quadrant of left breast in female, estrogen receptor negative ([place])  10/27/2019 Mammogram   IMPRESSION: 1. Right breast calcifications spanning 2.6 cm in the upper slightly medial breast are indeterminate.   2. Left breast dominant mass at 3 o'clock, 6 cm from nipple measuring 3x3.9x2.9 cm is highly suspicious. There is overlying skin thickening, skin involvement is not excluded.   3. Left breast mass/distorted tissue at 1 o'clock, 4cm from nipple measuring 3.0x1.2x2.9 cm is likely in contiguity with the dominant mass at 3 o'clock and is also highly suspicious. With the dominant lesion the overall span a disease is approximately 6 cm.   4. Left breast distortion at 2 o'clock 10 cm from the nipple is Suspicious, measuring 1.0 x 0.8 cm.   5.  Abnormal left axillary lymph nodes (2). Cortical thickness measures up to 0.6 cm.    10/28/2019 Initial Biopsy    Diagnosis 1. Breast, left, needle core biopsy, 3 o'clock, 5cmfn - INVASIVE MAMMARY CARCINOMA. 2. Breast, left, needle core biopsy, 2 o'clock, 10cmfn - INVASIVE MAMMARY CARCINOMA. 3. 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 Microscopic Comment 1. -3. Overall, immunohistochemistry favors a pronounced desmoplastic response, but metaplastic carcinoma cannot be excluded (Epithelioid component: CKAE1AE3 strong +, CK5/6 weak +). The greatest linear extent of tumor in any one core in specimen 1 is 14 mm. The greatest linear extent of tumor in any one core in specimen 2 is 16 mm.   10/28/2019 Receptors her2   3. PROGNOSTIC INDICATORS Results: IMMUNOHISTOCHEMICAL AND MORPHOMETRIC ANALYSIS PERFORMED MANUALLY The tumor cells are EQUIVOCAL for Her2 (2+). Her2 by FISH will be performed and results reported separately. Estrogen Receptor: 0%, NEGATIVE Progesterone Receptor: 0%, NEGATIVE Proliferation Marker Ki67: 10%    3. FLUORESCENCE IN-SITU HYBRIDIZATION Results: GROUP 5: HER2 **NEGATIVE** Equivocal form of amplification of the HER2 gene was detected in the IHC 2+ tissue sample received from this individual. HER2 FISH was performed by a technologist and cell imaging and analysis on the [employer].   10/28/2019 Cancer Staging   Staging form: Breast, AJCC 8th Edition - Clinical stage from 10/28/2019: Stage IIIB (cT2, cN1, cM0, G3, ER-, PR-, HER2-) - Signed by Eppie Gibson, MD on 11/05/2019   10/31/2019 Initial Diagnosis   Malignant neoplasm of upper-outer quadrant of left breast in female, estrogen receptor negative ([place])   10/31/2019 Pathology Results   Diagnosis Breast, right, needle core biopsy, UOQ, posterior - FOCAL USUAL DUCTAL HYPERPLASIA AND FIBROCYSTIC CHANGES WITH CALCIFICATIONS - FIBROADENOMATOID CHANGES -  NO MALIGNANCY IDENTIFIED Microscopic Comment These results were called to [facility] on November 03, 2019.     11/05/2019 Genetic Testing   Lindsay [name] declined Genetic testing    11/21/2019 Imaging   CT CAP w contrast  IMPRESSION: 1. Left breast mass and prominent left axillary lymph nodes, containing biopsy marking clips, consistent with newly diagnosed breast malignancy. 2. No definite evidence of distant metastatic disease in the chest, abdomen, or pelvis. 3. There are occasional small pulmonary nodules, measuring 2-3 mm, most likely incidental sequelae of prior infection or inflammation although metastatic disease is not excluded. Attention on follow-up. 4. Hepatic steatosis. 5. Aortic Atherosclerosis (ICD10-I70.0).     11/21/2019 Imaging   Bone Scan Whole Body IMPRESSION: 1. Single focus of uptake associated with a subacute fracture of LEFT anterior fourth rib. 2. No additional areas of abnormal uptake.   12/26/2019 Cancer Staging   Staging form: Breast, AJCC 8th Edition - Pathologic stage from 12/26/2019: Stage IIIC (pT3, pN3a, cM0, G3, ER-, PR-, HER2-) - Signed by Truitt Merle, MD on 01/28/2020   12/26/2019 Surgery   LEFT MASTECTOMY MODIFIED RADICAL and PAC Placement by Dr Marlou Starks   12/26/2019 Pathology Results   FINAL MICROSCOPIC DIAGNOSIS:   A. 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  - Ductal carcinoma in situ, high nuclear grade with central necrosis.  - Margins of resection:  - Metaplastic carcinoma focally involves the anterior margin and is < 1  mm from the posterior margin.  - DCIS is < 1 mm from the anterior margin.  - Metastatic carcinoma in (13) of (16) lymph nodes with extranodal  extension.  - Biopsy clip sites in breast and one lymph node.  - See oncology table.    ADDENDUM:  PROGNOSTIC INDICATOR RESULTS:  Immunohistochemical and morphometric analysis performed manually  The tumor cells are EQUIVOCAL for Her2 (2+). Her2 FISH has been ordered  and will be reported in an  addendum.  Estrogen Receptor:       NEGATIVE  Progesterone Receptor:   NEGATIVE  Proliferation Marker Ki-67:   30%    ADDENDUM:  FLOURESCENCE IN-SITU HYBRIDIZATION RESULTS:  GROUP 5:   HER2 NEGATIVE   01/28/2020 Echocardiogram   Baseline Echo  IMPRESSIONS     1. Left ventricular ejection fraction, by estimation, is 60 to 65%. The  left ventricle has normal function. The left ventricle has no regional  wall motion abnormalities. Left ventricular diastolic parameters are  consistent with Grade I diastolic  dysfunction (impaired relaxation). The average left ventricular global  longitudinal strain is -19.8 %.   2. Right ventricular systolic function is normal. The right ventricular  size is normal. Tricuspid regurgitation signal is inadequate for assessing  PA pressure.   3. The mitral valve is normal in structure. No evidence of mitral valve  regurgitation. No evidence of mitral stenosis.   4. The aortic valve is normal in structure. Aortic valve regurgitation is  not visualized. No aortic stenosis is present.   5. The inferior vena cava is normal in size with greater than 50%  respiratory variability, suggesting right atrial pressure of 3 mmHg.    02/05/2020 -  Chemotherapy   Keytruda q3weeks (to be taken for 1 whole year) with weekly Carboplatin/Taxol for 12 weeks starting 02/05/20 followed by Adriamycin/Cytoxan q2weeeks      CURRENT THERAPY:  Keytrudaq3weeks(to be taken for 1 whole year)with weeklyCarboplatin/Taxolfor 12 weeksstarting 9/30/21followed  by Adriamycin/Cytoxanq2weeks X4  INTERVAL HISTORY:  Lindsay [name] is here for a follow up. Lindsay [name] presents to the clinic alone.  Lindsay [name] tolerated last week of chemotherapy well, with moderate fatigue, no diarrhea or other side effects.  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  Her skin peeling on her bottom of feet has resolved.  Lindsay [name] has mild intermittent numbness of her right foot,  no other new  complaint.  All other systems were reviewed with the patient and are negative.  MEDICAL HISTORY:  Past Medical History:  Diagnosis Date  . Bronchitis   . Family history of bone cancer   . Family history of breast cancer   . Family history of prostate cancer   . Fibromyalgia   . HCV (hepatitis C virus)   . MRSA (methicillin resistant Staphylococcus aureus) 2012    SURGICAL HISTORY: Past Surgical History:  Procedure Laterality Date  . MASTECTOMY MODIFIED RADICAL Left 12/26/2019   Procedure: LEFT MASTECTOMY MODIFIED RADICAL;  Surgeon: [name], MD;  Location: [facility];  Service: General;  Laterality: Left;  PEC BLOCK  . PORTACATH PLACEMENT Right 12/26/2019   Procedure: INSERTION PORT-A-CATH WITH ULTRASOUND GUIDANCE;  Surgeon: [name], MD;  Location: [facility];  Service: General;  Laterality: Right;    I have reviewed the social history and family history with the patient and they are unchanged from previous note.  ALLERGIES:  is allergic to codeine.  MEDICATIONS:  Current Outpatient Medications  Medication Sig Dispense Refill  . acetaminophen (TYLENOL) 650 MG CR tablet Take 650 mg by mouth every 8 (eight) hours as needed for pain.     Marland Kitchen amoxicillin-clavulanate (AUGMENTIN) 875-125 MG tablet Take 1 tablet by mouth 2 (two) times daily. 14 tablet 0  . Ascorbic Acid (VITAMIN C PO) Take 1 tablet by mouth daily.    Marland Kitchen dexamethasone (DECADRON) 4 MG tablet Take 2 tablets (8 mg total) by mouth daily. Start the day after carboplatin chemotherapy for 3 days. 30 tablet 1  . diphenoxylate-atropine (LOMOTIL) 2.5-0.025 MG tablet Take 1-2 tablets by mouth 4 (four) times daily as needed for diarrhea or loose stools. 30 tablet 1  . lidocaine-prilocaine (EMLA) cream Apply 1 application topically as needed. 30 g 0  . methocarbamol (ROBAXIN) 750 MG tablet Take 1 tablet (750 mg total) by mouth 4 (four) times daily as needed (use for muscle cramps/pain). 30 tablet 2  . omeprazole (PRILOSEC) 20 MG  capsule Take 1 capsule (20 mg total) by mouth daily. 30 capsule 2  . ondansetron (ZOFRAN) 8 MG tablet Take 1 tablet (8 mg total) by mouth 2 (two) times daily as needed for refractory nausea / vomiting. Start on day 3 after carboplatin chemo. 30 tablet 1  . oxyCODONE (OXY IR/ROXICODONE) 5 MG immediate release tablet Take 1 tablet (5 mg total) by mouth every 6 (six) hours as needed (for pain score of 1-4). 20 tablet 0  . oxyCODONE (OXY IR/ROXICODONE) 5 MG immediate release tablet Take 1-2 tablets (5-10 mg total) by mouth every 6 (six) hours as needed for moderate pain, severe pain or breakthrough pain. 20 tablet 0  . prochlorperazine (COMPAZINE) 10 MG tablet Take 1 tablet (10 mg total) by mouth every 6 (six) hours as needed (Nausea or vomiting). 30 tablet 1   No current facility-administered medications for this visit.   Facility-Administered Medications Ordered in Other Visits  Medication Dose Route Frequency Provider Last Rate Last Admin  . CARBOplatin (PARAPLATIN) 150 mg in sodium chloride  0.9 % 100 mL chemo infusion  150 mg Intravenous Once Truitt Merle, MD      . heparin lock flush 100 unit/mL  500 Units Intracatheter Once PRN Truitt Merle, MD      . PACLitaxel (TAXOL) 114 mg in sodium chloride 0.9 % 250 mL chemo infusion (</= $RemoveBefor'80mg'ZMzqJkdakZLl$ /m2)  60 mg/m2 (Treatment Plan Recorded) Intravenous Once Truitt Merle, MD 195 mL/hr at 03/25/20 1104 Rate Verify at 03/25/20 1104  . sodium chloride flush (NS) 0.9 % injection 10 mL  10 mL Intracatheter PRN Truitt Merle, MD        PHYSICAL EXAMINATION: ECOG PERFORMANCE STATUS: 1 - Symptomatic but completely ambulatory  Vitals:   03/25/20 0821  BP: (!) 164/80  Pulse: 97  Resp: 18  Temp: 97.6 F (36.4 C)  SpO2: 97%   Filed Weights   03/25/20 0821  Weight: 186 lb 9.6 oz (84.6 kg)    GENERAL:alert, no distress and comfortable SKIN: skin color, texture, turgor are normal, no rashes or significant lesions EYES: normal, Conjunctiva are pink and non-injected, sclera  clear NECK: supple, thyroid normal size, non-tender, without nodularity LYMPH:  no palpable lymphadenopathy in the cervical, axillary  LUNGS: clear to auscultation and percussion with normal breathing effort HEART: regular rate & rhythm and no murmurs and no lower extremity edema ABDOMEN:abdomen soft, non-tender and normal bowel sounds Musculoskeletal:no cyanosis of digits and no clubbing  NEURO: alert & oriented x 3 with fluent speech, no focal motor/sensory deficits  LABORATORY DATA:  I have reviewed the data as listed CBC Latest Ref Rng & Units 03/25/2020 03/17/2020 03/11/2020  WBC 4.0 - 10.5 K/uL 5.5 3.2(L) 4.4  Hemoglobin 12.0 - 15.0 g/dL 10.9(L) 11.4(L) 11.1(L)  Hematocrit 36 - 46 % 32.1(L) 32.9(L) 32.0(L)  Platelets 150 - 400 K/uL 175 186 172     CMP Latest Ref Rng & Units 03/25/2020 03/17/2020 03/11/2020  Glucose 70 - 99 mg/dL 108(H) 106(H) 110(H)  BUN 8 - 23 mg/dL 8 5(L) 9  Creatinine 0.44 - 1.00 mg/dL 0.65 0.65 0.65  Sodium 135 - 145 mmol/L 141 139 138  Potassium 3.5 - 5.1 mmol/L 3.6 3.7 3.9  Chloride 98 - 111 mmol/L 108 105 107  CO2 22 - 32 mmol/L $RemoveB'26 26 27  'HjhEaVfa$ Calcium 8.9 - 10.3 mg/dL 8.8(L) 8.7(L) 8.7(L)  Total Protein 6.5 - 8.1 g/dL 5.8(L) 6.0(L) 5.9(L)  Total Bilirubin 0.3 - 1.2 mg/dL 0.4 0.8 0.5  Alkaline Phos 38 - 126 U/L 66 60 65  AST 15 - 41 U/L 31 42(H) 41  ALT 0 - 44 U/L $Remo'13 18 16      'JVWoP$ RADIOGRAPHIC STUDIES: I have personally reviewed the radiological images as listed and agreed with the findings in the report. No results found.   ASSESSMENT & PLAN:  Serria Sloma is a 64 y.o. female with    1.Left breast Multifocal Metaplastic cancer,pT3N3aM0,including one unresectable axillary node, metaplastic carcinoma,ER-/PR-/HER2-, grade IIIC -Lindsay [name] was diagnosed in 10/2019 with left breastmetaplasticcancer, triple negative disease metastatic to left axillary LN. Her 11/2019 CT CAP and bone scan was negative for distant metastasis.  -Lindsay [name] underwent left  mastectomy with Dr Marlou Starks on 12/26/19.Surgical pathshowed6cm multifocal metaplastic carcinomaand13/16 positive LN but unfortunately 1 positive LN was not able to be removed given it invasion ofa vessel. -Lindsay [name] startedadjuvant chemo with Keytrudaq3weeks(to be taken for 1 whole year)withweekly Carboplatin/Taxolfor 12 weeksbeginning9/30/21followed by Adriamycin/Cytoxanq2weeksX4 -Lindsay [name] developed severe diarrhea after second week of carbo and Taxol, Lindsay [name] is tolerating much better now with dose reduction and slow infusion  of taxol --Lab reviewed, adequate for treatment, plan to continue carbo AUC 1.5, low-dose Taxol 60 mg/m weekly  -Due to her mild neutropenia last week, Lindsay [name] received 1 dose of filgrastim earlier this week, neutropenia resolved -f/u in 2 weeks    2. Hepatitis C,untreated,diagnosed in [facility] her to ID or [employer] for treatment after Lindsay [name] completed chemo. -Lindsay [name] also notes a history of MRSA which Lindsay [name] is not sure has completed cleared. -We will watch her liver functions closely during the chemotherapy.We discussed her that Lindsay [name] may have hepatitis C flare when Lindsay [name] has chemo  3. Social and financial support  -Lindsay [name] has no children and no close relatives. Lindsay [name] does have close friends who are supportive.  -Lindsay [name] did not have medical insurance for many years. Lindsay [name] recently applied for blue cross and blue shield which is not effective yet.  4. Mild anemia -Secondary to chemotherapy, mild, will continue monitoring.  PLAN: -Lab reviewed, adequate for treatment, will proceed week4carboplatin AUC 1.5, and paclitaxel 60 mg/mtoday with slow infusion(40min) and will continue this dose for the rest of treatment weekly  -f/u in 2 weeks  -Patient would like to have fewer grams the injection at home if needed, we will check with her insurance, will give if ANC<1.5k   No problem-specific Assessment & Plan notes found for this encounter.   No orders of the defined  types were placed in this encounter.  All questions were answered. The patient knows to call the clinic with any problems, questions or concerns. No barriers to learning was detected. The total time spent in the appointment was 30 minutes.     Truitt Merle, MD 03/25/2020   I, Joslyn Devon, am acting as scribe for Truitt Merle, MD.   I have reviewed the above documentation for accuracy and completeness, and I agree with the above.

## 2020-03-24 ENCOUNTER — Ambulatory Visit: Payer: Medicare Other | Admitting: Physical Therapy

## 2020-03-24 ENCOUNTER — Other Ambulatory Visit: Payer: Self-pay

## 2020-03-24 ENCOUNTER — Encounter: Payer: Self-pay | Admitting: Physical Therapy

## 2020-03-24 DIAGNOSIS — Z170 Estrogen receptor positive status [ER+]: Secondary | ICD-10-CM

## 2020-03-24 DIAGNOSIS — M79622 Pain in left upper arm: Secondary | ICD-10-CM

## 2020-03-24 DIAGNOSIS — C50412 Malignant neoplasm of upper-outer quadrant of left female breast: Secondary | ICD-10-CM | POA: Diagnosis not present

## 2020-03-24 DIAGNOSIS — M25612 Stiffness of left shoulder, not elsewhere classified: Secondary | ICD-10-CM

## 2020-03-24 DIAGNOSIS — R293 Abnormal posture: Secondary | ICD-10-CM

## 2020-03-24 DIAGNOSIS — Z483 Aftercare following surgery for neoplasm: Secondary | ICD-10-CM

## 2020-03-24 NOTE — Therapy (Signed)
[facility] [place], [place], [address] Phone: [phone]   Fax:  [phone]  Physical Therapy Treatment  Patient Details  Name: Lindsay [name] MRN: [id number] Date of Birth: Jun 15, 1955 Referring Provider (PT): Dr. Autumn Messing   Encounter Date: 03/24/2020   PT End of Session - 03/24/20 1352    Visit Number 15    Number of Visits 18    Date for PT Re-Evaluation 04/09/20    PT Start Time 1346    PT Stop Time 1432    PT Time Calculation (min) 46 min    Activity Tolerance Patient tolerated treatment well    Behavior During Therapy [facility] for tasks assessed/performed           Past Medical History:  Diagnosis Date  . Bronchitis   . Family history of bone cancer   . Family history of breast cancer   . Family history of prostate cancer   . Fibromyalgia   . HCV (hepatitis C virus)   . MRSA (methicillin resistant Staphylococcus aureus) 2012    Past Surgical History:  Procedure Laterality Date  . MASTECTOMY MODIFIED RADICAL Left 12/26/2019   Procedure: LEFT MASTECTOMY MODIFIED RADICAL;  Surgeon: [name], MD;  Location: [place];  Service: General;  Laterality: Left;  PEC BLOCK  . PORTACATH PLACEMENT Right 12/26/2019   Procedure: INSERTION PORT-A-CATH WITH ULTRASOUND GUIDANCE;  Surgeon: [name], MD;  Location: [place];  Service: General;  Laterality: Right;    There were no vitals filed for this visit.   Subjective Assessment - 03/24/20 1351    Subjective COVID-19 screening performed upon arrival. Patient reports doing well and has been using it at work.    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 Patient underwent a left modified radical mastectomy on 12/26/2019 with 13 of 16 were positive for cancer. The Ki67 is 10%.    Patient Stated Goals Get my arm better    Currently in Pain? No/denies              [facility] PT Assessment  - 03/24/20 0001      Assessment   Medical Diagnosis s/p left MRM    Referring Provider (PT) Dr. Autumn Messing    Onset Date/Surgical Date 12/26/19    Hand Dominance Right    Next MD Visit unsure    Prior Therapy Baselines      Precautions   Precautions Other (comment)    Precaution Comments active cancer,       Restrictions   Weight Bearing Restrictions No                         [facility] Adult PT Treatment/Exercise - 03/24/20 0001      Exercises   Exercises Shoulder;Elbow      Elbow Exercises   Elbow Flexion Strengthening;Left;20 reps;Seated;10 reps    Bar Weights/Barbell (Elbow Flexion) 1 lb    Elbow Extension Strengthening;Left;20 reps;Seated;10 reps    Bar Weights/Barbell (Elbow Extension) 1 lb    Other elbow exercises hammer curls 1# x30      Shoulder Exercises: Prone   Retraction Strengthening;Left;20 reps;10 reps   bent over row   Retraction Weight (lbs) 2    Extension Strengthening;Left;10 reps;20 reps;Other (comment);Weights    Extension Limitations 2      Shoulder Exercises: Standing   Protraction Strengthening;Left;20 reps;Theraband;10 reps    Theraband Level (Shoulder  Protraction) Level 2 (Red)    Horizontal ABduction Strengthening;Both;20 reps;10 reps;Theraband    Theraband Level (Shoulder Horizontal ABduction) Level 1 (Yellow)    Flexion Strengthening;Both;Other (comment)   to fatigue   Shoulder Flexion Weight (lbs) 4# medicine ball     Extension Strengthening;Left;20 reps;Theraband;10 reps    Theraband Level (Shoulder Extension) Level 2 (Red)    Row Strengthening;20 reps;Theraband;10 reps;Both    Theraband Level (Shoulder Row) Level 2 (Red)    Other Standing Exercises bilateral chop/lift D2 and D1 flexion with 4# medicine ball to fatigue    Other Standing Exercises left D2 flexion 2#  x30      Shoulder Exercises: Pulleys   Flexion 5 minutes                       PT Long Term Goals - 03/08/20 1430      PT LONG TERM GOAL #1    Title Patient will demonstrate she has regained full shoulder ROM and function post operatively compared to baselines.    Time 4    Period Weeks    Status Achieved      PT LONG TERM GOAL #2   Title Patient will increase left shoulder active flexion to >/= 130 degrees for increased ease reacing overhead.    Baseline 98 degrees post op; 146 pre-op    Time 4    Period Weeks    Status Achieved      PT LONG TERM GOAL #3   Title Patient will increase left shoulder active abduction to >/= 130 degrees for ability to obtain radiation positioning.    Baseline 110 degrees post op; 151 degrees pre-op    Time 4    Period Weeks    Status Achieved      PT LONG TERM GOAL #4   Title Patient will improve the Quick DASH score to be </= 10 for improved overall upper extremity function.    Baseline 27.27    Time 4    Period Weeks    Status On-going      PT LONG TERM GOAL #5   Title Patient will verbalize good understanding of lymphedema risk reduction practices after attending the ABC class.    Time 4    Period Weeks    Status On-going                 Plan - 03/24/20 1453    Clinical Impression Statement Patient arrives to physical therapy feeling good with no reports of pain or tightness though axillary region. Patient was able to complete treatment but with intermittent cuing for form and technique. Patient able to perform all exercises without rest breaks indicating improvements in muscular endurance. Patient and PT discussed plan of care for remaining visits to which she reported understanding.    Stability/Clinical Decision Making Stable/Uncomplicated    Clinical Decision Making Low    Rehab Potential Excellent    PT Frequency 2x / week    PT Duration 4 weeks    PT Treatment/Interventions ADLs/Self Care Home Management;Therapeutic exercise;Patient/family education;Manual techniques;Passive range of motion;Scar mobilization;Therapeutic activities    PT Next Visit Plan PROM, myofascial  release to left axillary region, AAROM exercises; Avoid UBE, E-stim, ultrasound secondary to cancer/lymphedema precautions    PT Home Exercise Plan Post op shoulder ROM HEP    Consulted and Agree with Plan of Care Patient           Patient will benefit from skilled therapeutic intervention  in order to improve the following deficits and impairments:  Postural dysfunction, Decreased range of motion, Decreased knowledge of precautions, Impaired UE functional use, Pain, Increased fascial restricitons, Decreased scar mobility  Visit Diagnosis: Malignant neoplasm of upper-outer quadrant of left breast in female, estrogen receptor positive ([facility])  Abnormal posture  Stiffness of left shoulder, not elsewhere classified  Pain in left upper arm  Aftercare following surgery for neoplasm     Problem List Patient Active Problem List   Diagnosis Date Noted  . Port-A-Cath in place 02/04/2020  . Family history of breast cancer   . Family history of prostate cancer   . Family history of bone cancer   . Malignant neoplasm of upper-outer quadrant of left breast in female, estrogen receptor negative ([place]) 10/31/2019    Gabriela Eves, PT, DPT 03/24/2020, 3:12 PM  [facility] [address] [place], [place], [address] Phone: [phone]   Fax:  [phone]  Name: Lindsay [name] MRN: [id number] Date of Birth: 11/30/1955

## 2020-03-25 ENCOUNTER — Inpatient Hospital Stay: Payer: Medicare Other | Admitting: Hematology

## 2020-03-25 ENCOUNTER — Other Ambulatory Visit: Payer: Self-pay

## 2020-03-25 ENCOUNTER — Encounter: Payer: Self-pay | Admitting: Hematology

## 2020-03-25 ENCOUNTER — Inpatient Hospital Stay: Payer: Medicare Other

## 2020-03-25 ENCOUNTER — Encounter: Payer: Self-pay | Admitting: *Deleted

## 2020-03-25 VITALS — BP 164/80 | HR 97 | Temp 97.6°F | Resp 18 | Ht 61.0 in | Wt 186.6 lb

## 2020-03-25 DIAGNOSIS — Z171 Estrogen receptor negative status [ER-]: Secondary | ICD-10-CM

## 2020-03-25 DIAGNOSIS — Z5111 Encounter for antineoplastic chemotherapy: Secondary | ICD-10-CM | POA: Diagnosis not present

## 2020-03-25 DIAGNOSIS — Z95828 Presence of other vascular implants and grafts: Secondary | ICD-10-CM

## 2020-03-25 DIAGNOSIS — C50412 Malignant neoplasm of upper-outer quadrant of left female breast: Secondary | ICD-10-CM

## 2020-03-25 LAB — CMP (CANCER CENTER ONLY)
ALT: 13 U/L (ref 0–44)
AST: 31 U/L (ref 15–41)
Albumin: 3.7 g/dL (ref 3.5–5.0)
Alkaline Phosphatase: 66 U/L (ref 38–126)
Anion gap: 7 (ref 5–15)
BUN: 8 mg/dL (ref 8–23)
CO2: 26 mmol/L (ref 22–32)
Calcium: 8.8 mg/dL — ABNORMAL LOW (ref 8.9–10.3)
Chloride: 108 mmol/L (ref 98–111)
Creatinine: 0.65 mg/dL (ref 0.44–1.00)
GFR, Estimated: >60 mL/min (ref >60)
Glucose, Bld: 108 mg/dL — ABNORMAL HIGH (ref 70–99)
Potassium: 3.6 mmol/L (ref 3.5–5.1)
Sodium: 141 mmol/L (ref 135–145)
Total Bilirubin: 0.4 mg/dL (ref 0.3–1.2)
Total Protein: 5.8 g/dL — ABNORMAL LOW (ref 6.5–8.1)

## 2020-03-25 LAB — CBC WITH DIFFERENTIAL (CANCER CENTER ONLY)
Abs Immature Granulocytes: 0.08 10*3/uL — ABNORMAL HIGH (ref 0.00–0.07)
Basophils Absolute: 0.1 10*3/uL (ref 0.0–0.1)
Basophils Relative: 1 %
Eosinophils Absolute: 0.1 10*3/uL (ref 0.0–0.5)
Eosinophils Relative: 3 %
HCT: 32.1 % — ABNORMAL LOW (ref 36.0–46.0)
Hemoglobin: 10.9 g/dL — ABNORMAL LOW (ref 12.0–15.0)
Immature Granulocytes: 2 %
Lymphocytes Relative: 39 %
Lymphs Abs: 2.2 10*3/uL (ref 0.7–4.0)
MCH: 32.8 pg (ref 26.0–34.0)
MCHC: 34 g/dL (ref 30.0–36.0)
MCV: 96.7 fL (ref 80.0–100.0)
Monocytes Absolute: 0.4 10*3/uL (ref 0.1–1.0)
Monocytes Relative: 8 %
Neutro Abs: 2.6 10*3/uL (ref 1.7–7.7)
Neutrophils Relative %: 47 %
Platelet Count: 175 10*3/uL (ref 150–400)
RBC: 3.32 MIL/uL — ABNORMAL LOW (ref 3.87–5.11)
RDW: 13.5 % (ref 11.5–15.5)
WBC Count: 5.5 10*3/uL (ref 4.0–10.5)
nRBC: 0 % (ref 0.0–0.2)

## 2020-03-25 MED ORDER — PALONOSETRON HCL INJECTION 0.25 MG/5ML
INTRAVENOUS | Status: AC
  Filled 2020-03-25: qty 5

## 2020-03-25 MED ORDER — SODIUM CHLORIDE 0.9% FLUSH
10.0000 mL | INTRAVENOUS | Status: DC | PRN
  Administered 2020-03-25: 10 mL
  Filled 2020-03-25: qty 10

## 2020-03-25 MED ORDER — FAMOTIDINE IN NACL 20-0.9 MG/50ML-% IV SOLN
INTRAVENOUS | Status: AC
  Filled 2020-03-25: qty 50

## 2020-03-25 MED ORDER — PROCHLORPERAZINE MALEATE 10 MG PO TABS
ORAL_TABLET | ORAL | Status: AC
  Filled 2020-03-25: qty 1

## 2020-03-25 MED ORDER — SODIUM CHLORIDE 0.9 % IV SOLN
Freq: Once | INTRAVENOUS | Status: AC
  Filled 2020-03-25: qty 250

## 2020-03-25 MED ORDER — SODIUM CHLORIDE 0.9 % IV SOLN
152.5500 mg | Freq: Once | INTRAVENOUS | Status: AC
  Administered 2020-03-25: 150 mg via INTRAVENOUS
  Filled 2020-03-25: qty 15

## 2020-03-25 MED ORDER — METHYLPREDNISOLONE SODIUM SUCC 40 MG IJ SOLR
20.0000 mg | Freq: Once | INTRAMUSCULAR | Status: AC
  Administered 2020-03-25: 20 mg via INTRAVENOUS

## 2020-03-25 MED ORDER — SODIUM CHLORIDE 0.9 % IV SOLN
60.0000 mg/m2 | Freq: Once | INTRAVENOUS | Status: AC
  Administered 2020-03-25: 114 mg via INTRAVENOUS
  Filled 2020-03-25: qty 19

## 2020-03-25 MED ORDER — PALONOSETRON HCL INJECTION 0.25 MG/5ML
0.2500 mg | Freq: Once | INTRAVENOUS | Status: AC
  Administered 2020-03-25: 0.25 mg via INTRAVENOUS

## 2020-03-25 MED ORDER — SODIUM CHLORIDE 0.9% FLUSH
10.0000 mL | Freq: Once | INTRAVENOUS | Status: AC
  Administered 2020-03-25: 10 mL
  Filled 2020-03-25: qty 10

## 2020-03-25 MED ORDER — SODIUM CHLORIDE 0.9 % IV SOLN
40.0000 mg | Freq: Once | INTRAVENOUS | Status: AC
  Administered 2020-03-25: 40 mg via INTRAVENOUS
  Filled 2020-03-25: qty 4

## 2020-03-25 MED ORDER — LORATADINE 10 MG PO TABS
ORAL_TABLET | ORAL | Status: AC
  Filled 2020-03-25: qty 1

## 2020-03-25 MED ORDER — METHYLPREDNISOLONE SODIUM SUCC 40 MG IJ SOLR
INTRAMUSCULAR | Status: AC
  Filled 2020-03-25: qty 1

## 2020-03-25 MED ORDER — LORATADINE 10 MG PO TABS
10.0000 mg | ORAL_TABLET | Freq: Once | ORAL | Status: AC
  Administered 2020-03-25: 10 mg via ORAL

## 2020-03-25 MED ORDER — HEPARIN SOD (PORK) LOCK FLUSH 100 UNIT/ML IV SOLN
500.0000 [IU] | Freq: Once | INTRAVENOUS | Status: AC | PRN
  Administered 2020-03-25: 500 [IU]
  Filled 2020-03-25: qty 5

## 2020-03-25 NOTE — Patient Instructions (Signed)
[facility] Discharge Instructions for Patients Receiving Chemotherapy  Today you received the following chemotherapy agents: Paclitaxel and Carboplatin  To help prevent nausea and vomiting after your treatment, we encourage you to take your nausea medication as prescribed.    If you develop nausea and vomiting that is not controlled by your nausea medication, call the clinic.   BELOW ARE SYMPTOMS THAT SHOULD BE REPORTED IMMEDIATELY:  *FEVER GREATER THAN 100.5 F  *CHILLS WITH OR WITHOUT FEVER  NAUSEA AND VOMITING THAT IS NOT CONTROLLED WITH YOUR NAUSEA MEDICATION  *UNUSUAL SHORTNESS OF BREATH  *UNUSUAL BRUISING OR BLEEDING  TENDERNESS IN MOUTH AND THROAT WITH OR WITHOUT PRESENCE OF ULCERS  *URINARY PROBLEMS  *BOWEL PROBLEMS  UNUSUAL RASH Items with * indicate a potential emergency and should be followed up as soon as possible.  Feel free to call the clinic should you have any questions or concerns. The clinic phone number is [phone].  Please show the CHEMO ALERT CARD at check-in to the Emergency Department and triage nurse.   

## 2020-03-25 NOTE — Progress Notes (Signed)
Per patient request, patient observed 30-minutes following the completion of her infusion treatment. Patient tolerated infusions well. No signs of distress upon discharge.

## 2020-03-25 NOTE — Patient Instructions (Signed)

## 2020-03-26 ENCOUNTER — Telehealth: Payer: Self-pay | Admitting: Hematology

## 2020-03-26 NOTE — Telephone Encounter (Signed)
Scheduled per 11/18 los. Unable to reach pt. Left voicemail with appt times and dates.

## 2020-03-29 ENCOUNTER — Ambulatory Visit: Payer: Medicare Other

## 2020-03-29 ENCOUNTER — Encounter: Payer: Medicare Other | Admitting: Physical Therapy

## 2020-03-29 ENCOUNTER — Other Ambulatory Visit: Payer: Self-pay

## 2020-03-29 DIAGNOSIS — Z483 Aftercare following surgery for neoplasm: Secondary | ICD-10-CM

## 2020-03-29 NOTE — Therapy (Signed)
[place], [place], [address] Phone: [phone]   Fax:  [phone]  Physical Therapy Treatment  Patient Details  Name: Lindsay [name] MRN: [id number] Date of Birth: 07-Jan-1956 Referring Provider (PT): Dr. Autumn Messing   Encounter Date: 03/29/2020   PT End of Session - 03/29/20 1533    Visit Number 15   # unchanged due to screen only   Number of Visits 18    Date for PT Re-Evaluation 04/09/20    PT Start Time 2778    PT Stop Time 1533    PT Time Calculation (min) 35 min    Activity Tolerance Patient tolerated treatment well    Behavior During Therapy [facility] for tasks assessed/performed           Past Medical History:  Diagnosis Date  . Bronchitis   . Family history of bone cancer   . Family history of breast cancer   . Family history of prostate cancer   . Fibromyalgia   . HCV (hepatitis C virus)   . MRSA (methicillin resistant Staphylococcus aureus) 2012    Past Surgical History:  Procedure Laterality Date  . MASTECTOMY MODIFIED RADICAL Left 12/26/2019   Procedure: LEFT MASTECTOMY MODIFIED RADICAL;  Surgeon: [name], MD;  Location: [facility];  Service: General;  Laterality: Left;  PEC BLOCK  . PORTACATH PLACEMENT Right 12/26/2019   Procedure: INSERTION PORT-A-CATH WITH ULTRASOUND GUIDANCE;  Surgeon: [name], MD;  Location: [facility];  Service: General;  Laterality: Right;    There were no vitals filed for this visit.   Subjective Assessment - 03/29/20 1455    Subjective Pt returns for her 3 month L-Dex screen.    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 Patient underwent a left modified radical mastectomy on 12/26/2019 with 13 of 16 were positive for cancer. The Ki67 is 10%.                  L-DEX FLOWSHEETS - 03/29/20 1500      L-DEX LYMPHEDEMA SCREENING   BASELINE SCORE (UNILATERAL) 7.4      L-DEX SCORE (UNILATERAL) 21.4    VALUE CHANGE (UNILAT) 14                                  PT Long Term Goals - 03/08/20 1430      PT LONG TERM GOAL #1   Title Patient will demonstrate she has regained full shoulder ROM and function post operatively compared to baselines.    Time 4    Period Weeks    Status Achieved      PT LONG TERM GOAL #2   Title Patient will increase left shoulder active flexion to >/= 130 degrees for increased ease reacing overhead.    Baseline 98 degrees post op; 146 pre-op    Time 4    Period Weeks    Status Achieved      PT LONG TERM GOAL #3   Title Patient will increase left shoulder active abduction to >/= 130 degrees for ability to obtain radiation positioning.    Baseline 110 degrees post op; 151 degrees pre-op    Time 4    Period Weeks    Status Achieved      PT LONG TERM GOAL #4   Title Patient will improve the Quick DASH score  to be </= 10 for improved overall upper extremity function.    Baseline 27.27    Time 4    Period Weeks    Status On-going      PT LONG TERM GOAL #5   Title Patient will verbalize good understanding of lymphedema risk reduction practices after attending the ABC class.    Time 4    Period Weeks    Status On-going                 Plan - 03/29/20 1534    Clinical Impression Statement Pt returns for 3 month L-Dex screen. Her change from baseline of 14 was higher than normal limits which indiactes subclinical lymphedema. She was issued a size III compression sleeve and gauntlet and instructed to wear this for 10 hrs/day for next 4 weeks then to return to [place] for another L-Dex screen. Pt was agreeable to this and is to call this therapist back next week to see if fitting well and how she is tolerating it. If well, she would like to look into pursuing getting at least one more from [place].    PT Next Visit Plan Pt is to weawr her new compression sleeve and gauntlet daily for  next 4 weeks then return for L-Dex screen in 1 month to reassess new onset of subclinical lymphedema.    Consulted and Agree with Plan of Care Patient           Patient will benefit from skilled therapeutic intervention in order to improve the following deficits and impairments:     Visit Diagnosis: Aftercare following surgery for neoplasm     Problem List Patient Active Problem List   Diagnosis Date Noted  . Port-A-Cath in place 02/04/2020  . Family history of breast cancer   . Family history of prostate cancer   . Family history of bone cancer   . Malignant neoplasm of upper-outer quadrant of left breast in female, estrogen receptor negative ([place]) 10/31/2019    Lindsay [name], PTA 03/29/2020, 4:25 PM  [place], [place], [address] Phone: [phone]   Fax:  [phone]  Name: Lindsay [name] MRN: [id number] Date of Birth: 19-Jun-1955

## 2020-03-30 NOTE — Progress Notes (Signed)
[facility] OFFICE PROGRESS NOTE  [place] [address], DO [address] [place] [address]  DIAGNOSIS:  F/uof left breastmetaplasticcancer, triple negative   Oncology History Overview Note  Cancer Staging Malignant neoplasm of upper-outer quadrant of left breast in female, estrogen receptor negative ([place]) Staging form: Breast, AJCC 8th Edition - Clinical stage from 10/28/2019: Stage IIIB (cT2, cN1, cM0, G3, ER-, PR-, HER2-) - Signed by Eppie Gibson, MD on 11/05/2019 - Pathologic stage from 12/26/2019: Stage IIIC (pT3, pN3a, cM0, G3, ER-, PR-, HER2-) - Signed by Truitt Merle, MD on 01/28/2020    Malignant neoplasm of upper-outer quadrant of left breast in female, estrogen receptor negative ([place])  10/27/2019 Mammogram   IMPRESSION: 1. Right breast calcifications spanning 2.6 cm in the upper slightly medial breast are indeterminate.   2. Left breast dominant mass at 3 o'clock, 6 cm from nipple measuring 3x3.9x2.9 cm is highly suspicious. There is overlying skin thickening, skin involvement is not excluded.   3. Left breast mass/distorted tissue at 1 o'clock, 4cm from nipple measuring 3.0x1.2x2.9 cm is likely in contiguity with the dominant mass at 3 o'clock and is also highly suspicious. With the dominant lesion the overall span a disease is approximately 6 cm.   4. Left breast distortion at 2 o'clock 10 cm from the nipple is Suspicious, measuring 1.0 x 0.8 cm.   5.  Abnormal left axillary lymph nodes (2). Cortical thickness measures up to 0.6 cm.    10/28/2019 Initial Biopsy   Diagnosis 1. Breast, left, needle core biopsy, 3 o'clock, 5cmfn - INVASIVE MAMMARY CARCINOMA. 2. Breast, left, needle core biopsy, 2 o'clock, 10cmfn - INVASIVE MAMMARY CARCINOMA. 3. 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 Microscopic Comment 1. -3. Overall, immunohistochemistry favors a pronounced desmoplastic response, but metaplastic  carcinoma cannot be excluded (Epithelioid component: CKAE1AE3 strong +, CK5/6 weak +). The greatest linear extent of tumor in any one core in specimen 1 is 14 mm. The greatest linear extent of tumor in any one core in specimen 2 is 16 mm.   10/28/2019 Receptors her2   3. PROGNOSTIC INDICATORS Results: IMMUNOHISTOCHEMICAL AND MORPHOMETRIC ANALYSIS PERFORMED MANUALLY The tumor cells are EQUIVOCAL for Her2 (2+). Her2 by FISH will be performed and results reported separately. Estrogen Receptor: 0%, NEGATIVE Progesterone Receptor: 0%, NEGATIVE Proliferation Marker Ki67: 10%    3. FLUORESCENCE IN-SITU HYBRIDIZATION Results: GROUP 5: HER2 **NEGATIVE** Equivocal form of amplification of the HER2 gene was detected in the IHC 2+ tissue sample received from this individual. HER2 FISH was performed by a technologist and cell imaging and analysis on the [employer].   10/28/2019 Cancer Staging   Staging form: Breast, AJCC 8th Edition - Clinical stage from 10/28/2019: Stage IIIB (cT2, cN1, cM0, G3, ER-, PR-, HER2-) - Signed by Eppie Gibson, MD on 11/05/2019   10/31/2019 Initial Diagnosis   Malignant neoplasm of upper-outer quadrant of left breast in female, estrogen receptor negative ([place])   10/31/2019 Pathology Results   Diagnosis Breast, right, needle core biopsy, UOQ, posterior - FOCAL USUAL DUCTAL HYPERPLASIA AND FIBROCYSTIC CHANGES WITH CALCIFICATIONS - FIBROADENOMATOID CHANGES - NO MALIGNANCY IDENTIFIED Microscopic Comment These results were called to [facility] on November 03, 2019.   11/05/2019 Genetic Testing   She declined Genetic testing    11/21/2019 Imaging   CT CAP w contrast  IMPRESSION: 1. Left breast mass and prominent left axillary lymph nodes, containing biopsy marking clips, consistent with  newly diagnosed breast malignancy. 2. No definite evidence of distant metastatic disease in the chest, abdomen, or pelvis. 3. There are occasional small pulmonary  nodules, measuring 2-3 mm, most likely incidental sequelae of prior infection or inflammation although metastatic disease is not excluded. Attention on follow-up. 4. Hepatic steatosis. 5. Aortic Atherosclerosis (ICD10-I70.0).     11/21/2019 Imaging   Bone Scan Whole Body IMPRESSION: 1. Single focus of uptake associated with a subacute fracture of LEFT anterior fourth rib. 2. No additional areas of abnormal uptake.   12/26/2019 Cancer Staging   Staging form: Breast, AJCC 8th Edition - Pathologic stage from 12/26/2019: Stage IIIC (pT3, pN3a, cM0, G3, ER-, PR-, HER2-) - Signed by Truitt Merle, MD on 01/28/2020   12/26/2019 Surgery   LEFT MASTECTOMY MODIFIED RADICAL and PAC Placement by Dr Marlou Starks   12/26/2019 Pathology Results   FINAL MICROSCOPIC DIAGNOSIS:   A. 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  - Ductal carcinoma in situ, high nuclear grade with central necrosis.  - Margins of resection:  - Metaplastic carcinoma focally involves the anterior margin and is < 1  mm from the posterior margin.  - DCIS is < 1 mm from the anterior margin.  - Metastatic carcinoma in (13) of (16) lymph nodes with extranodal  extension.  - Biopsy clip sites in breast and one lymph node.  - See oncology table.    ADDENDUM:  PROGNOSTIC INDICATOR RESULTS:  Immunohistochemical and morphometric analysis performed manually  The tumor cells are EQUIVOCAL for Her2 (2+). Her2 FISH has been ordered  and will be reported in an addendum.  Estrogen Receptor:       NEGATIVE  Progesterone Receptor:   NEGATIVE  Proliferation Marker Ki-67:   30%    ADDENDUM:  FLOURESCENCE IN-SITU HYBRIDIZATION RESULTS:  GROUP 5:   HER2 NEGATIVE   01/28/2020 Echocardiogram   Baseline Echo  IMPRESSIONS     1. Left ventricular ejection fraction, by estimation, is 60 to 65%. The  left ventricle has normal function. The left ventricle  has no regional  wall motion abnormalities. Left ventricular diastolic parameters are  consistent with Grade I diastolic  dysfunction (impaired relaxation). The average left ventricular global  longitudinal strain is -19.8 %.   2. Right ventricular systolic function is normal. The right ventricular  size is normal. Tricuspid regurgitation signal is inadequate for assessing  PA pressure.   3. The mitral valve is normal in structure. No evidence of mitral valve  regurgitation. No evidence of mitral stenosis.   4. The aortic valve is normal in structure. Aortic valve regurgitation is  not visualized. No aortic stenosis is present.   5. The inferior vena cava is normal in size with greater than 50%  respiratory variability, suggesting right atrial pressure of 3 mmHg.    02/05/2020 -  Chemotherapy   Keytruda q3weeks (to be taken for 1 whole year) with weekly Carboplatin/Taxol for 12 weeks starting 02/05/20 followed by Adriamycin/Cytoxan q2weeeks     CURRENT THERAPY: Keytrudaq3weeks(to be taken for 1 whole year)with weeklyCarboplatin/Taxolfor 12 weeksstarting 9/30/21followed by Adriamycin/Cytoxanq2weeksX4.   INTERVAL HISTORY: Lindsay [name] 64 y.o. female returns to the clinic today for a follow up visit. The patient is feeling fairly well today without any concerning complaints. She is currently undergoing physical therapy. She started getting lymphedema in her left upper extremity and is wearing a compression sleeve today. She had a left mastectomy. She denies  any fevers, chills, night sweats, or weight loss. She denies signs or symptoms of infection. She reports fatigue after she completes her steroids. She denies any diarrhea at this time. She reports some constipation for which she takes stool softener if needed. She denies nausea, or vomiting. The numbness in her left foot is stable. She denies any rashes or skin changes. She is here for evaluation before starting her next cycle  of weekly carboplatin and taxol.     MEDICAL HISTORY: Past Medical History:  Diagnosis Date  . Bronchitis   . Family history of bone cancer   . Family history of breast cancer   . Family history of prostate cancer   . Fibromyalgia   . HCV (hepatitis C virus)   . MRSA (methicillin resistant Staphylococcus aureus) 2012    ALLERGIES:  is allergic to codeine.  MEDICATIONS:  Current Outpatient Medications  Medication Sig Dispense Refill  . acetaminophen (TYLENOL) 650 MG CR tablet Take 650 mg by mouth every 8 (eight) hours as needed for pain.     Marland Kitchen amoxicillin-clavulanate (AUGMENTIN) 875-125 MG tablet Take 1 tablet by mouth 2 (two) times daily. 14 tablet 0  . ascorbic acid (VITAMIN C) 250 MG CHEW Chew by mouth.    . dexamethasone (DECADRON) 4 MG tablet Take 2 tablets (8 mg total) by mouth daily. Start the day after carboplatin chemotherapy for 3 days. 30 tablet 1  . diphenoxylate-atropine (LOMOTIL) 2.5-0.025 MG tablet Take 1-2 tablets by mouth 4 (four) times daily as needed for diarrhea or loose stools. 30 tablet 1  . gabapentin (NEURONTIN) 300 MG capsule Take 300 mg by mouth 3 (three) times daily as needed.    . lidocaine-prilocaine (EMLA) cream Apply 1 application topically as needed. 30 g 0  . methocarbamol (ROBAXIN) 750 MG tablet Take 1 tablet (750 mg total) by mouth 4 (four) times daily as needed (use for muscle cramps/pain). 30 tablet 2  . omeprazole (PRILOSEC) 20 MG capsule Take 1 capsule (20 mg total) by mouth daily. 30 capsule 2  . ondansetron (ZOFRAN) 8 MG tablet Take 1 tablet (8 mg total) by mouth 2 (two) times daily as needed for refractory nausea / vomiting. Start on day 3 after carboplatin chemo. 30 tablet 1  . oxyCODONE (OXY IR/ROXICODONE) 5 MG immediate release tablet Take 1-2 tablets (5-10 mg total) by mouth every 6 (six) hours as needed for moderate pain, severe pain or breakthrough pain. 20 tablet 0  . prochlorperazine (COMPAZINE) 10 MG tablet Take 1 tablet (10 mg total) by  mouth every 6 (six) hours as needed (Nausea or vomiting). 30 tablet 1   No current facility-administered medications for this visit.    SURGICAL HISTORY:  Past Surgical History:  Procedure Laterality Date  . MASTECTOMY MODIFIED RADICAL Left 12/26/2019   Procedure: LEFT MASTECTOMY MODIFIED RADICAL;  Surgeon: [name], MD;  Location: [facility];  Service: General;  Laterality: Left;  PEC BLOCK  . PORTACATH PLACEMENT Right 12/26/2019   Procedure: INSERTION PORT-A-CATH WITH ULTRASOUND GUIDANCE;  Surgeon: [name], MD;  Location: [facility];  Service: General;  Laterality: Right;    REVIEW OF SYSTEMS:   Review of Systems  Constitutional: Negative for appetite change, chills, fatigue, fever and unexpected weight change.  HENT:   Negative for mouth sores, nosebleeds, sore throat and trouble swallowing.   Eyes: Negative for eye problems and icterus.  Respiratory: Negative for cough, hemoptysis, shortness of breath and wheezing.   Cardiovascular: Negative for chest pain and leg  swelling.  Gastrointestinal: Positive for constipation. Negative for abdominal pain, diarrhea, nausea and vomiting.  Genitourinary: Negative for bladder incontinence, difficulty urinating, dysuria, frequency and hematuria.   Musculoskeletal: Positive for swelling in left upper extremity. Negative for back pain, gait problem, neck pain and neck stiffness.  Skin: Negative for itching and rash.  Neurological: Negative for dizziness, extremity weakness, gait problem, headaches, light-headedness and seizures.  Hematological: Negative for adenopathy. Does not bruise/bleed easily.  Psychiatric/Behavioral: Negative for confusion, depression and sleep disturbance. The patient is not nervous/anxious.     PHYSICAL EXAMINATION:  Blood pressure 128/69, pulse 85, temperature 97.6 F (36.4 C), temperature source Tympanic, resp. rate 17, height _0  (1.549 m), weight 188 lb 9.6 oz (85.5 kg), SpO2 100 %.  ECOG PERFORMANCE STATUS: 1 -  Symptomatic but completely ambulatory  Physical Exam  Constitutional: Oriented to person, place, and time and well-developed, well-nourished, and in no distress.  HENT:  Head: Normocephalic and atraumatic.  Mouth/Throat: Oropharynx is clear and moist. No oropharyngeal exudate.  Eyes: Conjunctivae are normal. Right eye exhibits no discharge. Left eye exhibits no discharge. No scleral icterus.  Neck: Normal range of motion. Neck supple.  Cardiovascular: Normal rate, regular rhythm, normal heart sounds and intact distal pulses.   Pulmonary/Chest: Effort normal and breath sounds normal. No respiratory distress. No wheezes. No rales.  Abdominal: Soft. Bowel sounds are normal. Exhibits no distension and no mass. There is no tenderness.  Musculoskeletal: Normal range of motion. Exhibits no edema.  Lymphadenopathy:    No cervical adenopathy.  Neurological: Alert and oriented to person, place, and time. Exhibits normal muscle tone. Gait normal. Coordination normal.  Skin: Skin is warm and dry. No rash noted. Not diaphoretic. No erythema. No pallor.  Psychiatric: Mood, memory and judgment normal.  Vitals reviewed.  LABORATORY DATA: Lab Results  Component Value Date   WBC 3.1 (L) 03/31/2020   HGB 10.7 (L) 03/31/2020   HCT 31.0 (L) 03/31/2020   MCV 95.7 03/31/2020   PLT 163 03/31/2020      Chemistry      Component Value Date/Time   NA 138 03/31/2020 0927   K 3.8 03/31/2020 0927   CL 107 03/31/2020 0927   CO2 26 03/31/2020 0927   BUN 7 (L) 03/31/2020 0927   CREATININE 0.65 03/31/2020 0927      Component Value Date/Time   CALCIUM 8.6 (L) 03/31/2020 0927   ALKPHOS 58 03/31/2020 0927   AST 37 03/31/2020 0927   ALT 14 03/31/2020 0927   BILITOT 0.6 03/31/2020 0927       RADIOGRAPHIC STUDIES:  No results found.   ASSESSMENT/PLAN:  Lindsay [name] is a 64 y.o. female with   1.Left breast Multifocal Metaplastic cancer,pT3N3aM0,including one unresectable axillary node,  metaplastic carcinoma,ER-/PR-/HER2-, grade IIIC -She was diagnosed in 10/2019 with left breastmetaplasticcancer, triple negative disease metastatic to left axillary LN. Her 11/2019 CT CAP and bone scan was negative for distant metastasis.  -She underwent left mastectomy with Dr Marlou Starks on 12/26/19.Surgical pathshowed6cm multifocal metaplastic carcinomaand13/16 positive LN but unfortunately 1 positive LN was not able to be removed given it invasion ofa vessel. -She startedadjuvant chemo with Keytrudaq3weeks(to be taken for 1 whole year)withweekly Carboplatin/Taxolfor 12 weeksbeginning9/30/21followed by Adriamycin/Cytoxanq2weeksX4 -She developed severe diarrhea after second week of carbo and Taxol, she is tolerating much better now with dose reduction andslow infusion of taxol -Lab reviewed, his ANC is 1.2 today. We will arrange for filgrastim injection on 04/02/20.  to continue carbo AUC 1.5, low-dose Taxol 60 mg/mweekly  -  F/U in 1 week.   2. Hepatitis C,untreated,diagnosed in 1992 -Dr. Burr Medico previouslyreferred her to [facility] or [facility] for treatment after she completed chemo. -She also notes a history of MRSA which she is not sure has completed cleared. -We will watch her liver functions closely during the chemotherapy.We discussed her that she may have hepatitis C flare when she has chemo  3. Social and financial support  -She has no children and no close relatives. She does have close friends who are supportive.  -She did not have medical insurance for many years. She recently applied for blue cross and blue shield which is not effective yet.  4. Mild anemia -Secondary to chemotherapy, mild, will continue monitoring.  PLAN: -Lab reviewed, adequate for treatment, withcarboplatin AUC 1.5, and paclitaxel 60 mg/mtoday with slow infusion(37mn)and will continue this dose for the rest of treatment weekly  -f/u in 2 weeks -Will arrange for filgrastim injection on  04/02/20 due to low ANC          No orders of the defined types were placed in this encounter.    Kenyonna Micek L Shantaya Bluestone, PA-C 03/31/20

## 2020-03-31 ENCOUNTER — Ambulatory Visit: Payer: Medicare Other

## 2020-03-31 ENCOUNTER — Inpatient Hospital Stay (HOSPITAL_BASED_OUTPATIENT_CLINIC_OR_DEPARTMENT_OTHER): Payer: Medicare Other | Admitting: Physician Assistant

## 2020-03-31 ENCOUNTER — Inpatient Hospital Stay: Payer: Medicare Other

## 2020-03-31 ENCOUNTER — Other Ambulatory Visit: Payer: Medicare Other

## 2020-03-31 ENCOUNTER — Other Ambulatory Visit: Payer: Self-pay

## 2020-03-31 VITALS — BP 128/69 | HR 85 | Temp 97.6°F | Resp 17 | Ht 61.0 in | Wt 188.6 lb

## 2020-03-31 DIAGNOSIS — C50412 Malignant neoplasm of upper-outer quadrant of left female breast: Secondary | ICD-10-CM

## 2020-03-31 DIAGNOSIS — D701 Agranulocytosis secondary to cancer chemotherapy: Secondary | ICD-10-CM | POA: Diagnosis not present

## 2020-03-31 DIAGNOSIS — T451X5A Adverse effect of antineoplastic and immunosuppressive drugs, initial encounter: Secondary | ICD-10-CM | POA: Diagnosis not present

## 2020-03-31 DIAGNOSIS — Z171 Estrogen receptor negative status [ER-]: Secondary | ICD-10-CM | POA: Diagnosis not present

## 2020-03-31 DIAGNOSIS — Z5111 Encounter for antineoplastic chemotherapy: Secondary | ICD-10-CM | POA: Diagnosis not present

## 2020-03-31 LAB — CBC WITH DIFFERENTIAL (CANCER CENTER ONLY)
Abs Immature Granulocytes: 0.03 10*3/uL (ref 0.00–0.07)
Basophils Absolute: 0 10*3/uL (ref 0.0–0.1)
Basophils Relative: 1 %
Eosinophils Absolute: 0.1 10*3/uL (ref 0.0–0.5)
Eosinophils Relative: 4 %
HCT: 31 % — ABNORMAL LOW (ref 36.0–46.0)
Hemoglobin: 10.7 g/dL — ABNORMAL LOW (ref 12.0–15.0)
Immature Granulocytes: 1 %
Lymphocytes Relative: 44 %
Lymphs Abs: 1.4 10*3/uL (ref 0.7–4.0)
MCH: 33 pg (ref 26.0–34.0)
MCHC: 34.5 g/dL (ref 30.0–36.0)
MCV: 95.7 fL (ref 80.0–100.0)
Monocytes Absolute: 0.2 10*3/uL (ref 0.1–1.0)
Monocytes Relative: 7 %
Neutro Abs: 1.3 10*3/uL — ABNORMAL LOW (ref 1.7–7.7)
Neutrophils Relative %: 43 %
Platelet Count: 163 10*3/uL (ref 150–400)
RBC: 3.24 MIL/uL — ABNORMAL LOW (ref 3.87–5.11)
RDW: 13.6 % (ref 11.5–15.5)
WBC Count: 3.1 10*3/uL — ABNORMAL LOW (ref 4.0–10.5)
nRBC: 0 % (ref 0.0–0.2)

## 2020-03-31 LAB — CMP (CANCER CENTER ONLY)
ALT: 14 U/L (ref 0–44)
AST: 37 U/L (ref 15–41)
Albumin: 3.6 g/dL (ref 3.5–5.0)
Alkaline Phosphatase: 58 U/L (ref 38–126)
Anion gap: 5 (ref 5–15)
BUN: 7 mg/dL — ABNORMAL LOW (ref 8–23)
CO2: 26 mmol/L (ref 22–32)
Calcium: 8.6 mg/dL — ABNORMAL LOW (ref 8.9–10.3)
Chloride: 107 mmol/L (ref 98–111)
Creatinine: 0.65 mg/dL (ref 0.44–1.00)
GFR, Estimated: >60 mL/min (ref >60)
Glucose, Bld: 112 mg/dL — ABNORMAL HIGH (ref 70–99)
Potassium: 3.8 mmol/L (ref 3.5–5.1)
Sodium: 138 mmol/L (ref 135–145)
Total Bilirubin: 0.6 mg/dL (ref 0.3–1.2)
Total Protein: 5.9 g/dL — ABNORMAL LOW (ref 6.5–8.1)

## 2020-03-31 MED ORDER — LORATADINE 10 MG PO TABS
10.0000 mg | ORAL_TABLET | Freq: Once | ORAL | Status: AC
  Administered 2020-03-31: 10 mg via ORAL

## 2020-03-31 MED ORDER — SODIUM CHLORIDE 0.9 % IV SOLN
152.5500 mg | Freq: Once | INTRAVENOUS | Status: AC
  Administered 2020-03-31: 150 mg via INTRAVENOUS
  Filled 2020-03-31: qty 15

## 2020-03-31 MED ORDER — FAMOTIDINE IN NACL 20-0.9 MG/50ML-% IV SOLN
INTRAVENOUS | Status: AC
  Filled 2020-03-31: qty 50

## 2020-03-31 MED ORDER — PALONOSETRON HCL INJECTION 0.25 MG/5ML
INTRAVENOUS | Status: AC
  Filled 2020-03-31: qty 5

## 2020-03-31 MED ORDER — PALONOSETRON HCL INJECTION 0.25 MG/5ML
0.2500 mg | Freq: Once | INTRAVENOUS | Status: AC
  Administered 2020-03-31: 0.25 mg via INTRAVENOUS

## 2020-03-31 MED ORDER — SODIUM CHLORIDE 0.9% FLUSH
10.0000 mL | INTRAVENOUS | Status: DC | PRN
  Administered 2020-03-31: 10 mL
  Filled 2020-03-31: qty 10

## 2020-03-31 MED ORDER — LORATADINE 10 MG PO TABS
ORAL_TABLET | ORAL | Status: AC
  Filled 2020-03-31: qty 1

## 2020-03-31 MED ORDER — SODIUM CHLORIDE 0.9 % IV SOLN
Freq: Once | INTRAVENOUS | Status: AC
  Filled 2020-03-31: qty 250

## 2020-03-31 MED ORDER — SODIUM CHLORIDE 0.9 % IV SOLN
40.0000 mg | Freq: Once | INTRAVENOUS | Status: AC
  Administered 2020-03-31: 40 mg via INTRAVENOUS
  Filled 2020-03-31: qty 4

## 2020-03-31 MED ORDER — HEPARIN SOD (PORK) LOCK FLUSH 100 UNIT/ML IV SOLN
500.0000 [IU] | Freq: Once | INTRAVENOUS | Status: AC | PRN
  Administered 2020-03-31: 500 [IU]
  Filled 2020-03-31: qty 5

## 2020-03-31 MED ORDER — METHYLPREDNISOLONE SODIUM SUCC 40 MG IJ SOLR
20.0000 mg | Freq: Once | INTRAMUSCULAR | Status: AC
  Administered 2020-03-31: 20 mg via INTRAVENOUS

## 2020-03-31 MED ORDER — METHYLPREDNISOLONE SODIUM SUCC 40 MG IJ SOLR
INTRAMUSCULAR | Status: AC
  Filled 2020-03-31: qty 1

## 2020-03-31 MED ORDER — SODIUM CHLORIDE 0.9 % IV SOLN
60.0000 mg/m2 | Freq: Once | INTRAVENOUS | Status: AC
  Administered 2020-03-31: 114 mg via INTRAVENOUS
  Filled 2020-03-31: qty 19

## 2020-03-31 NOTE — Patient Instructions (Signed)
[facility] Discharge Instructions for Patients Receiving Chemotherapy  Today you received the following chemotherapy agents Taxol and Carboplatin.   To help prevent nausea and vomiting after your treatment, we encourage you to take your nausea medication as prescribed.    If you develop nausea and vomiting that is not controlled by your nausea medication, call the clinic.   BELOW ARE SYMPTOMS THAT SHOULD BE REPORTED IMMEDIATELY:  *FEVER GREATER THAN 100.5 F  *CHILLS WITH OR WITHOUT FEVER  NAUSEA AND VOMITING THAT IS NOT CONTROLLED WITH YOUR NAUSEA MEDICATION  *UNUSUAL SHORTNESS OF BREATH  *UNUSUAL BRUISING OR BLEEDING  TENDERNESS IN MOUTH AND THROAT WITH OR WITHOUT PRESENCE OF ULCERS  *URINARY PROBLEMS  *BOWEL PROBLEMS  UNUSUAL RASH Items with * indicate a potential emergency and should be followed up as soon as possible.  Feel free to call the clinic should you have any questions or concerns. The clinic phone number is [phone].  Please show the CHEMO ALERT CARD at check-in to the Emergency Department and triage nurse.   

## 2020-03-31 NOTE — Progress Notes (Signed)
Patient discharged from the Cancer Center in stable condition and with no signs of acute distress.    

## 2020-04-02 ENCOUNTER — Inpatient Hospital Stay: Payer: Medicare Other

## 2020-04-02 ENCOUNTER — Other Ambulatory Visit: Payer: Self-pay

## 2020-04-02 VITALS — BP 133/78 | HR 80 | Temp 97.9°F | Resp 18

## 2020-04-02 DIAGNOSIS — Z95828 Presence of other vascular implants and grafts: Secondary | ICD-10-CM

## 2020-04-02 DIAGNOSIS — Z5111 Encounter for antineoplastic chemotherapy: Secondary | ICD-10-CM | POA: Diagnosis not present

## 2020-04-02 MED ORDER — FILGRASTIM-AAFI 480 MCG/0.8ML IJ SOSY
480.0000 ug | PREFILLED_SYRINGE | Freq: Once | INTRAMUSCULAR | Status: AC
  Administered 2020-04-02: 480 ug via SUBCUTANEOUS
  Filled 2020-04-02: qty 0.8

## 2020-04-05 ENCOUNTER — Other Ambulatory Visit: Payer: Self-pay

## 2020-04-05 ENCOUNTER — Ambulatory Visit: Payer: Medicare Other | Admitting: Physical Therapy

## 2020-04-05 ENCOUNTER — Encounter: Payer: Self-pay | Admitting: Physical Therapy

## 2020-04-05 DIAGNOSIS — M25612 Stiffness of left shoulder, not elsewhere classified: Secondary | ICD-10-CM

## 2020-04-05 DIAGNOSIS — C50412 Malignant neoplasm of upper-outer quadrant of left female breast: Secondary | ICD-10-CM | POA: Diagnosis not present

## 2020-04-05 DIAGNOSIS — Z483 Aftercare following surgery for neoplasm: Secondary | ICD-10-CM

## 2020-04-05 DIAGNOSIS — M79622 Pain in left upper arm: Secondary | ICD-10-CM

## 2020-04-05 DIAGNOSIS — R293 Abnormal posture: Secondary | ICD-10-CM

## 2020-04-05 NOTE — Therapy (Signed)
[facility] [place], [place], [address] Phone: [phone]   Fax:  [phone]  Physical Therapy Treatment  Patient Details  Name: Lindsay [name] MRN: [id number] Date of Birth: 05/27/1955 Referring Provider (PT): Dr. Autumn Messing   Encounter Date: 04/05/2020   PT End of Session - 04/05/20 1357    Visit Number 16    Number of Visits 18    Date for PT Re-Evaluation 04/09/20    PT Start Time 1346    PT Stop Time 1430    PT Time Calculation (min) 44 min    Activity Tolerance Patient tolerated treatment well    Behavior During Therapy [facility] for tasks assessed/performed           Past Medical History:  Diagnosis Date  . Bronchitis   . Family history of bone cancer   . Family history of breast cancer   . Family history of prostate cancer   . Fibromyalgia   . HCV (hepatitis C virus)   . MRSA (methicillin resistant Staphylococcus aureus) 2012    Past Surgical History:  Procedure Laterality Date  . MASTECTOMY MODIFIED RADICAL Left 12/26/2019   Procedure: LEFT MASTECTOMY MODIFIED RADICAL;  Surgeon: [name], MD;  Location: [place];  Service: General;  Laterality: Left;  PEC BLOCK  . PORTACATH PLACEMENT Right 12/26/2019   Procedure: INSERTION PORT-A-CATH WITH ULTRASOUND GUIDANCE;  Surgeon: [name], MD;  Location: [place];  Service: General;  Laterality: Right;    There were no vitals filed for this visit.   Subjective Assessment - 04/05/20 1355    Subjective COVID-19 screening performed upon arrival. Patient arrives reporting a development of lymphedema in left UE. Has compressing dressing donned but reports increased tightness at proximal band.    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 Patient underwent a left modified radical mastectomy on 12/26/2019 with 13 of 16 were positive for cancer. The Ki67 is 10%.    Patient Stated Goals  Get my arm better    Currently in Pain? No/denies              [facility] PT Assessment - 04/05/20 0001      Assessment   Medical Diagnosis s/p left MRM    Referring Provider (PT) Dr. Autumn Messing    Onset Date/Surgical Date 12/26/19    Hand Dominance Right    Next MD Visit unsure    Prior Therapy Baselines      Precautions   Precautions Other (comment)    Precaution Comments active cancer,       Restrictions   Weight Bearing Restrictions No                         [facility] Adult PT Treatment/Exercise - 04/05/20 0001      Elbow Exercises   Elbow Flexion Strengthening;Left;20 reps;10 reps;Standing    Bar Weights/Barbell (Elbow Flexion) 2 lbs    Elbow Extension Strengthening;Left;20 reps;10 reps;Standing    Bar Weights/Barbell (Elbow Extension) 2 lbs    Other elbow exercises left hammer curls 2# x30      Shoulder Exercises: Seated   Other Seated Exercises Lat pull downs x20 Blue  XTS      Shoulder Exercises: Standing   Extension Strengthening;Left;20 reps;Theraband;10 reps    Extension Limitations [employer] Strengthening;20 reps;Theraband;10 reps;Both    Row Limitations Blue XTS  Diagonals Strengthening;Both;20 reps    Diagonals Limitations Blue XTS    Other Standing Exercises shoulder punch outs blue XTS x20    Other Standing Exercises left D2 flexion yellow theraband  x30      Shoulder Exercises: Pulleys   Flexion 5 minutes                  PT Education - 04/05/20 1539    Education Details Summary of Lymphedema Risk Reduction Practices    Person(s) Educated Patient    Methods Explanation    Comprehension Verbalized understanding               PT Long Term Goals - 03/08/20 1430      PT LONG TERM GOAL #1   Title Patient will demonstrate she has regained full shoulder ROM and function post operatively compared to baselines.    Time 4    Period Weeks    Status Achieved      PT LONG TERM GOAL #2   Title Patient will increase  left shoulder active flexion to >/= 130 degrees for increased ease reacing overhead.    Baseline 98 degrees post op; 146 pre-op    Time 4    Period Weeks    Status Achieved      PT LONG TERM GOAL #3   Title Patient will increase left shoulder active abduction to >/= 130 degrees for ability to obtain radiation positioning.    Baseline 110 degrees post op; 151 degrees pre-op    Time 4    Period Weeks    Status Achieved      PT LONG TERM GOAL #4   Title Patient will improve the Quick DASH score to be </= 10 for improved overall upper extremity function.    Baseline 27.27    Time 4    Period Weeks    Status On-going      PT LONG TERM GOAL #5   Title Patient will verbalize good understanding of lymphedema risk reduction practices after attending the ABC class.    Time 4    Period Weeks    Status On-going                 Plan - 04/05/20 1539    Clinical Impression Statement Patient arrives to physical therapy with a diagnosis of lymphedema. Patient guided through strengthening TEs with intermittent tactile and verbal cuing. Patient and PT discussed the importance of compression sleeve and exercise for lymphedema. Patient instructed to call cancer rehab for lymphedema questions. Patient provided with [facility]'s Summary of Lymphedema Risk Reduction Practices to review.    Stability/Clinical Decision Making Stable/Uncomplicated    Clinical Decision Making Low    Rehab Potential Excellent    PT Frequency 2x / week    PT Duration 4 weeks    PT Treatment/Interventions ADLs/Self Care Home Management;Therapeutic exercise;Patient/family education;Manual techniques;Passive range of motion;Scar mobilization;Therapeutic activities    PT Next Visit Plan continue LE strengthening and stability    Consulted and Agree with Plan of Care Patient           Patient will benefit from skilled therapeutic intervention in order to improve the following deficits and impairments:  Postural  dysfunction, Decreased range of motion, Decreased knowledge of precautions, Impaired UE functional use, Pain, Increased fascial restricitons, Decreased scar mobility  Visit Diagnosis: Aftercare following surgery for neoplasm  Malignant neoplasm of upper-outer quadrant of left breast in female, estrogen receptor positive (HCC)  Abnormal posture  Stiffness of left shoulder, not elsewhere classified  Pain in left upper arm     Problem List Patient Active Problem List   Diagnosis Date Noted  . Chemotherapy induced neutropenia ([place]) 03/31/2020  . Port-A-Cath in place 02/04/2020  . Family history of breast cancer   . Family history of prostate cancer   . Family history of bone cancer   . Malignant neoplasm of upper-outer quadrant of left breast in female, estrogen receptor negative ([place]) 10/31/2019    Gabriela Eves, PT, DPT 04/05/2020, 3:42 PM  [facility] [place], [place], [address] Phone: [phone]   Fax:  [phone]  Name: Lindsay [name] MRN: [id number] Date of Birth: [birth year]

## 2020-04-06 NOTE — Progress Notes (Signed)
Lindsay [name]   [phone] [phone]   Clinic Follow up Note   Patient Care Team: Scotty Court, DO as PCP - General Mauro Kaufmann, RN as Oncology Nurse Navigator Rockwell Germany, RN as Oncology Nurse Navigator [name], MD as Consulting Physician (General Surgery) Truitt Merle, MD as Consulting Physician (Hematology) Eppie Gibson, MD as Attending Physician (Radiation Oncology)  Date of Service:  04/08/2020  CHIEF COMPLAINT:  F/uof left breastmetaplasticcancer, triple negative  SUMMARY OF ONCOLOGIC HISTORY: Oncology History Overview Note  Cancer Staging Malignant neoplasm of upper-outer quadrant of left breast in female, estrogen receptor negative ([place]) Staging form: Breast, AJCC 8th Edition - Clinical stage from 10/28/2019: Stage IIIB (cT2, cN1, cM0, G3, ER-, PR-, HER2-) - Signed by Eppie Gibson, MD on 11/05/2019 - Pathologic stage from 12/26/2019: Stage IIIC (pT3, pN3a, cM0, G3, ER-, PR-, HER2-) - Signed by Truitt Merle, MD on 01/28/2020    Malignant neoplasm of upper-outer quadrant of left breast in female, estrogen receptor negative ([place])  10/27/2019 Mammogram   IMPRESSION: 1. Right breast calcifications spanning 2.6 cm in the upper slightly medial breast are indeterminate.   2. Left breast dominant mass at 3 o'clock, 6 cm from nipple measuring 3x3.9x2.9 cm is highly suspicious. There is overlying skin thickening, skin involvement is not excluded.   3. Left breast mass/distorted tissue at 1 o'clock, 4cm from nipple measuring 3.0x1.2x2.9 cm is likely in contiguity with the dominant mass at 3 o'clock and is also highly suspicious. With the dominant lesion the overall span a disease is approximately 6 cm.   4. Left breast distortion at 2 o'clock 10 cm from the nipple is Suspicious, measuring 1.0 x 0.8 cm.   5.  Abnormal left axillary lymph nodes (2). Cortical thickness measures up to 0.6 cm.    10/28/2019 Initial Biopsy    Diagnosis 1. Breast, left, needle core biopsy, 3 o'clock, 5cmfn - INVASIVE MAMMARY CARCINOMA. 2. Breast, left, needle core biopsy, 2 o'clock, 10cmfn - INVASIVE MAMMARY CARCINOMA. 3. 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 Microscopic Comment 1. -3. Overall, immunohistochemistry favors a pronounced desmoplastic response, but metaplastic carcinoma cannot be excluded (Epithelioid component: CKAE1AE3 strong +, CK5/6 weak +). The greatest linear extent of tumor in any one core in specimen 1 is 14 mm. The greatest linear extent of tumor in any one core in specimen 2 is 16 mm.   10/28/2019 Receptors her2   3. PROGNOSTIC INDICATORS Results: IMMUNOHISTOCHEMICAL AND MORPHOMETRIC ANALYSIS PERFORMED MANUALLY The tumor cells are EQUIVOCAL for Her2 (2+). Her2 by FISH will be performed and results reported separately. Estrogen Receptor: 0%, NEGATIVE Progesterone Receptor: 0%, NEGATIVE Proliferation Marker Ki67: 10%    3. FLUORESCENCE IN-SITU HYBRIDIZATION Results: GROUP 5: HER2 **NEGATIVE** Equivocal form of amplification of the HER2 gene was detected in the IHC 2+ tissue sample received from this individual. HER2 FISH was performed by a technologist and cell imaging and analysis on the [employer].   10/28/2019 Cancer Staging   Staging form: Breast, AJCC 8th Edition - Clinical stage from 10/28/2019: Stage IIIB (cT2, cN1, cM0, G3, ER-, PR-, HER2-) - Signed by Eppie Gibson, MD on 11/05/2019   10/31/2019 Initial Diagnosis   Malignant neoplasm of upper-outer quadrant of left breast in female, estrogen receptor negative ([place])   10/31/2019 Pathology Results   Diagnosis Breast, right, needle core biopsy, UOQ, posterior - FOCAL USUAL DUCTAL HYPERPLASIA AND FIBROCYSTIC CHANGES WITH CALCIFICATIONS - FIBROADENOMATOID CHANGES - NO  MALIGNANCY IDENTIFIED Microscopic Comment These results were called to [facility] on November 03, 2019.     11/05/2019 Genetic Testing   She declined Genetic testing    11/21/2019 Imaging   CT CAP w contrast  IMPRESSION: 1. Left breast mass and prominent left axillary lymph nodes, containing biopsy marking clips, consistent with newly diagnosed breast malignancy. 2. No definite evidence of distant metastatic disease in the chest, abdomen, or pelvis. 3. There are occasional small pulmonary nodules, measuring 2-3 mm, most likely incidental sequelae of prior infection or inflammation although metastatic disease is not excluded. Attention on follow-up. 4. Hepatic steatosis. 5. Aortic Atherosclerosis (ICD10-I70.0).     11/21/2019 Imaging   Bone Scan Whole Body IMPRESSION: 1. Single focus of uptake associated with a subacute fracture of LEFT anterior fourth rib. 2. No additional areas of abnormal uptake.   12/26/2019 Cancer Staging   Staging form: Breast, AJCC 8th Edition - Pathologic stage from 12/26/2019: Stage IIIC (pT3, pN3a, cM0, G3, ER-, PR-, HER2-) - Signed by Truitt Merle, MD on 01/28/2020   12/26/2019 Surgery   LEFT MASTECTOMY MODIFIED RADICAL and PAC Placement by Dr Marlou Starks   12/26/2019 Pathology Results   FINAL MICROSCOPIC DIAGNOSIS:   A. 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  - Ductal carcinoma in situ, high nuclear grade with central necrosis.  - Margins of resection:  - Metaplastic carcinoma focally involves the anterior margin and is < 1  mm from the posterior margin.  - DCIS is < 1 mm from the anterior margin.  - Metastatic carcinoma in (13) of (16) lymph nodes with extranodal  extension.  - Biopsy clip sites in breast and one lymph node.  - See oncology table.    ADDENDUM:  PROGNOSTIC INDICATOR RESULTS:  Immunohistochemical and morphometric analysis performed manually  The tumor cells are EQUIVOCAL for Her2 (2+). Her2 FISH has been ordered  and will be reported in an  addendum.  Estrogen Receptor:       NEGATIVE  Progesterone Receptor:   NEGATIVE  Proliferation Marker Ki-67:   30%    ADDENDUM:  FLOURESCENCE IN-SITU HYBRIDIZATION RESULTS:  GROUP 5:   HER2 NEGATIVE   01/28/2020 Echocardiogram   Baseline Echo  IMPRESSIONS     1. Left ventricular ejection fraction, by estimation, is 60 to 65%. The  left ventricle has normal function. The left ventricle has no regional  wall motion abnormalities. Left ventricular diastolic parameters are  consistent with Grade I diastolic  dysfunction (impaired relaxation). The average left ventricular global  longitudinal strain is -19.8 %.   2. Right ventricular systolic function is normal. The right ventricular  size is normal. Tricuspid regurgitation signal is inadequate for assessing  PA pressure.   3. The mitral valve is normal in structure. No evidence of mitral valve  regurgitation. No evidence of mitral stenosis.   4. The aortic valve is normal in structure. Aortic valve regurgitation is  not visualized. No aortic stenosis is present.   5. The inferior vena cava is normal in size with greater than 50%  respiratory variability, suggesting right atrial pressure of 3 mmHg.    02/05/2020 -  Chemotherapy   Keytruda q3weeks (to be taken for 1 whole year) with weekly Carboplatin/Taxol for 12 weeks starting 02/05/20 followed by Adriamycin/Cytoxan q2weeeks      CURRENT THERAPY:  Keytrudaq3weeks(to be taken for 1 whole year)with weeklyCarboplatin/Taxolfor 12 weeksstarting 9/30/21followed by  Adriamycin/Cytoxanq2weeksX4  INTERVAL HISTORY:  Lindsay [name] is here for a follow up. She presents to the clinic alone. She is waring left arm compression sleeves, mild lymphedema is stable  She is tolerating chemo well, mild fatigue, still works full time No diarrhea, appetite and energy level are faire  Weight stable   All other systems were reviewed with the patient and are negative.  MEDICAL HISTORY:   Past Medical History:  Diagnosis Date  . Bronchitis   . Family history of bone cancer   . Family history of breast cancer   . Family history of prostate cancer   . Fibromyalgia   . HCV (hepatitis C virus)   . MRSA (methicillin resistant Staphylococcus aureus) 2012    SURGICAL HISTORY: Past Surgical History:  Procedure Laterality Date  . MASTECTOMY MODIFIED RADICAL Left 12/26/2019   Procedure: LEFT MASTECTOMY MODIFIED RADICAL;  Surgeon: [name], MD;  Location: [place];  Service: General;  Laterality: Left;  PEC BLOCK  . PORTACATH PLACEMENT Right 12/26/2019   Procedure: INSERTION PORT-A-CATH WITH ULTRASOUND GUIDANCE;  Surgeon: [name], MD;  Location: [facility];  Service: General;  Laterality: Right;    I have reviewed the social history and family history with the patient and they are unchanged from previous note.  ALLERGIES:  is allergic to codeine.  MEDICATIONS:  Current Outpatient Medications  Medication Sig Dispense Refill  . acetaminophen (TYLENOL) 650 MG CR tablet Take 650 mg by mouth every 8 (eight) hours as needed for pain.     Marland Kitchen amoxicillin-clavulanate (AUGMENTIN) 875-125 MG tablet Take 1 tablet by mouth 2 (two) times daily. 14 tablet 0  . ascorbic acid (VITAMIN C) 250 MG CHEW Chew by mouth.    . dexamethasone (DECADRON) 4 MG tablet Take 2 tablets (8 mg total) by mouth daily. Start the day after carboplatin chemotherapy for 3 days. 30 tablet 1  . diphenoxylate-atropine (LOMOTIL) 2.5-0.025 MG tablet Take 1-2 tablets by mouth 4 (four) times daily as needed for diarrhea or loose stools. 30 tablet 1  . gabapentin (NEURONTIN) 300 MG capsule Take 300 mg by mouth 3 (three) times daily as needed.    . lidocaine-prilocaine (EMLA) cream Apply 1 application topically as needed. 30 g 2  . methocarbamol (ROBAXIN) 750 MG tablet Take 1 tablet (750 mg total) by mouth 4 (four) times daily as needed (use for muscle cramps/pain). 30 tablet 2  . omeprazole (PRILOSEC) 20 MG capsule Take  1 capsule (20 mg total) by mouth daily. 30 capsule 2  . ondansetron (ZOFRAN) 8 MG tablet Take 1 tablet (8 mg total) by mouth 2 (two) times daily as needed for refractory nausea / vomiting. Start on day 3 after carboplatin chemo. 30 tablet 1  . prochlorperazine (COMPAZINE) 10 MG tablet Take 1 tablet (10 mg total) by mouth every 6 (six) hours as needed (Nausea or vomiting). 30 tablet 1   No current facility-administered medications for this visit.    PHYSICAL EXAMINATION: ECOG PERFORMANCE STATUS: 1 - Symptomatic but completely ambulatory  Vitals:   04/08/20 0856  BP: 126/73  Pulse: 87  Resp: 18  Temp: 97.9 F (36.6 C)  SpO2: 99%   Filed Weights   04/08/20 0856  Weight: 187 lb (84.8 kg)    GENERAL:alert, no distress and comfortable SKIN: skin color, texture, turgor are normal, no rashes or significant lesions EYES: normal, Conjunctiva are pink and non-injected, sclera clear NECK: supple, thyroid normal size, non-tender, without nodularity LYMPH:  no palpable lymphadenopathy in  the cervical, axillary  LUNGS: clear to auscultation and percussion with normal breathing effort HEART: regular rate & rhythm and no murmurs and no lower extremity edema ABDOMEN:abdomen soft, non-tender and normal bowel sounds Musculoskeletal:no cyanosis of digits and no clubbing  NEURO: alert & oriented x 3 with fluent speech, no focal motor/sensory deficits Left mastectomy site well healed, mild lymphoedema in left arm   LABORATORY DATA:  I have reviewed the data as listed CBC Latest Ref Rng & Units 04/08/2020 03/31/2020 03/25/2020  WBC 4.0 - 10.5 K/uL 3.4(L) 3.1(L) 5.5  Hemoglobin 12.0 - 15.0 g/dL 10.2(L) 10.7(L) 10.9(L)  Hematocrit 36 - 46 % 30.1(L) 31.0(L) 32.1(L)  Platelets 150 - 400 K/uL 183 163 175     CMP Latest Ref Rng & Units 03/31/2020 03/25/2020 03/17/2020  Glucose 70 - 99 mg/dL 112(H) 108(H) 106(H)  BUN 8 - 23 mg/dL 7(L) 8 5(L)  Creatinine 0.44 - 1.00 mg/dL 0.65 0.65 0.65  Sodium 135  - 145 mmol/L 138 141 139  Potassium 3.5 - 5.1 mmol/L 3.8 3.6 3.7  Chloride 98 - 111 mmol/L 107 108 105  CO2 22 - 32 mmol/L _0 Calcium 8.9 - 10.3 mg/dL 8.6(L) 8.8(L) 8.7(L)  Total Protein 6.5 - 8.1 g/dL 5.9(L) 5.8(L) 6.0(L)  Total Bilirubin 0.3 - 1.2 mg/dL 0.6 0.4 0.8  Alkaline Phos 38 - 126 U/L 58 66 60  AST 15 - 41 U/L 37 31 42(H)  ALT 0 - 44 U/L _1 RADIOGRAPHIC STUDIES: I have personally reviewed the radiological images as listed and agreed with the findings in the report. No results found.   ASSESSMENT & PLAN:  Lindsay [name] is a 64 y.o. female with    1.Left breast Multifocal Metaplastic cancer,pT3N3aM0,including one unresectable axillary node, metaplastic carcinoma,ER-/PR-/HER2-, grade IIIC -She was diagnosed in 10/2019 with left breastmetaplasticcancer, triple negative disease metastatic to left axillary LN. Her 11/2019 CT CAP and bone scan was negative for distant metastasis.  -She underwent left mastectomy with Dr Marlou Starks on 12/26/19.Surgical pathshowed6cm multifocal metaplastic carcinomaand13/16 positive LN but unfortunately 1 positive LN was not able to be removed given it invasion ofa vessel. -She startedadjuvant chemo with Keytrudaq3weeks(to be taken for 1 whole year)withweekly Carboplatin/Taxolfor 12 weeksbeginning9/30/21followed by Adriamycin/Cytoxanq2weeksX4. -she is tolerating chemo well overall, continue    2. Hepatitis C,untreated,diagnosed in [place] her to ID or [employer] for treatment after she completed chemo. -She also notes a history of MRSA which she is not sure has completed cleared. -We will watch her liver functions closely during the chemotherapy.We discussed her that she may have hepatitis C flare when she has chemo  3. Social and financial support  -She has no children and no close relatives. She does have close friends who are supportive.  -She did not have medical insurance for many  years. She recently applied for blue cross and blue shield which is not effective yet.  4. Mild anemia -Secondary to chemotherapy, mild, will continue monitoring.  PLAN: -Lab reviewed, adequate for treatment, will proceed week9carboplatin AUC 1.5, and paclitaxel 60 mg/mtoday with slow infusion(13mn)and will continue this dose for the rest of treatment weekly  -f/u in 2 weeks -Patient would like to have GCSF injection at home if needed, we will check with her insurance, will give if ANC<1.5k -off chemo around Christmas    No problem-specific Assessment & Plan notes found for this encounter.   No orders of the defined types were placed in this encounter.  All questions were answered. The patient knows to call the clinic with any problems, questions or concerns. No barriers to learning was detected. The total time spent in the appointment was 30 minutes.     Truitt Merle, MD 04/08/2020   I, Joslyn Devon, am acting as scribe for Truitt Merle, MD.   I have reviewed the above documentation for accuracy and completeness, and I agree with the above.

## 2020-04-07 ENCOUNTER — Ambulatory Visit: Payer: Medicare Other | Attending: General Surgery | Admitting: Physical Therapy

## 2020-04-07 DIAGNOSIS — C50412 Malignant neoplasm of upper-outer quadrant of left female breast: Secondary | ICD-10-CM | POA: Insufficient documentation

## 2020-04-07 DIAGNOSIS — M79622 Pain in left upper arm: Secondary | ICD-10-CM | POA: Insufficient documentation

## 2020-04-07 DIAGNOSIS — Z170 Estrogen receptor positive status [ER+]: Secondary | ICD-10-CM | POA: Insufficient documentation

## 2020-04-07 DIAGNOSIS — R293 Abnormal posture: Secondary | ICD-10-CM | POA: Insufficient documentation

## 2020-04-07 DIAGNOSIS — Z483 Aftercare following surgery for neoplasm: Secondary | ICD-10-CM | POA: Insufficient documentation

## 2020-04-07 DIAGNOSIS — M25612 Stiffness of left shoulder, not elsewhere classified: Secondary | ICD-10-CM | POA: Insufficient documentation

## 2020-04-08 ENCOUNTER — Other Ambulatory Visit: Payer: Self-pay

## 2020-04-08 ENCOUNTER — Encounter: Payer: Self-pay | Admitting: *Deleted

## 2020-04-08 ENCOUNTER — Inpatient Hospital Stay: Payer: Medicare Other | Attending: Hematology

## 2020-04-08 ENCOUNTER — Inpatient Hospital Stay: Payer: Medicare Other

## 2020-04-08 ENCOUNTER — Encounter: Payer: Self-pay | Admitting: Hematology

## 2020-04-08 ENCOUNTER — Inpatient Hospital Stay (HOSPITAL_BASED_OUTPATIENT_CLINIC_OR_DEPARTMENT_OTHER): Payer: Medicare Other | Admitting: Hematology

## 2020-04-08 VITALS — BP 126/73 | HR 87 | Temp 97.9°F | Resp 18 | Ht 61.0 in | Wt 187.0 lb

## 2020-04-08 DIAGNOSIS — C50412 Malignant neoplasm of upper-outer quadrant of left female breast: Secondary | ICD-10-CM | POA: Insufficient documentation

## 2020-04-08 DIAGNOSIS — Z171 Estrogen receptor negative status [ER-]: Secondary | ICD-10-CM

## 2020-04-08 DIAGNOSIS — Z5112 Encounter for antineoplastic immunotherapy: Secondary | ICD-10-CM | POA: Insufficient documentation

## 2020-04-08 DIAGNOSIS — Z452 Encounter for adjustment and management of vascular access device: Secondary | ICD-10-CM | POA: Diagnosis not present

## 2020-04-08 DIAGNOSIS — D6481 Anemia due to antineoplastic chemotherapy: Secondary | ICD-10-CM | POA: Diagnosis not present

## 2020-04-08 DIAGNOSIS — Z95828 Presence of other vascular implants and grafts: Secondary | ICD-10-CM

## 2020-04-08 DIAGNOSIS — Z5111 Encounter for antineoplastic chemotherapy: Secondary | ICD-10-CM | POA: Insufficient documentation

## 2020-04-08 DIAGNOSIS — Z5189 Encounter for other specified aftercare: Secondary | ICD-10-CM | POA: Diagnosis not present

## 2020-04-08 DIAGNOSIS — B1920 Unspecified viral hepatitis C without hepatic coma: Secondary | ICD-10-CM | POA: Insufficient documentation

## 2020-04-08 LAB — CMP (CANCER CENTER ONLY)
ALT: 15 U/L (ref 0–44)
AST: 36 U/L (ref 15–41)
Albumin: 3.7 g/dL (ref 3.5–5.0)
Alkaline Phosphatase: 63 U/L (ref 38–126)
Anion gap: 7 (ref 5–15)
BUN: 9 mg/dL (ref 8–23)
CO2: 25 mmol/L (ref 22–32)
Calcium: 8.9 mg/dL (ref 8.9–10.3)
Chloride: 106 mmol/L (ref 98–111)
Creatinine: 0.67 mg/dL (ref 0.44–1.00)
GFR, Estimated: >60 mL/min (ref >60)
Glucose, Bld: 112 mg/dL — ABNORMAL HIGH (ref 70–99)
Potassium: 3.6 mmol/L (ref 3.5–5.1)
Sodium: 138 mmol/L (ref 135–145)
Total Bilirubin: 0.3 mg/dL (ref 0.3–1.2)
Total Protein: 5.9 g/dL — ABNORMAL LOW (ref 6.5–8.1)

## 2020-04-08 LAB — CBC WITH DIFFERENTIAL (CANCER CENTER ONLY)
Abs Immature Granulocytes: 0.05 10*3/uL (ref 0.00–0.07)
Basophils Absolute: 0.1 10*3/uL (ref 0.0–0.1)
Basophils Relative: 2 %
Eosinophils Absolute: 0.1 10*3/uL (ref 0.0–0.5)
Eosinophils Relative: 2 %
HCT: 30.1 % — ABNORMAL LOW (ref 36.0–46.0)
Hemoglobin: 10.2 g/dL — ABNORMAL LOW (ref 12.0–15.0)
Immature Granulocytes: 2 %
Lymphocytes Relative: 48 %
Lymphs Abs: 1.7 10*3/uL (ref 0.7–4.0)
MCH: 33 pg (ref 26.0–34.0)
MCHC: 33.9 g/dL (ref 30.0–36.0)
MCV: 97.4 fL (ref 80.0–100.0)
Monocytes Absolute: 0.6 10*3/uL (ref 0.1–1.0)
Monocytes Relative: 18 %
Neutro Abs: 1 10*3/uL — ABNORMAL LOW (ref 1.7–7.7)
Neutrophils Relative %: 28 %
Platelet Count: 183 10*3/uL (ref 150–400)
RBC: 3.09 MIL/uL — ABNORMAL LOW (ref 3.87–5.11)
RDW: 14.1 % (ref 11.5–15.5)
WBC Count: 3.4 10*3/uL — ABNORMAL LOW (ref 4.0–10.5)
nRBC: 0 % (ref 0.0–0.2)

## 2020-04-08 MED ORDER — LIDOCAINE-PRILOCAINE 2.5-2.5 % EX CREA
1.0000 | TOPICAL_CREAM | CUTANEOUS | 2 refills | Status: DC | PRN
Start: 2020-04-08 — End: 2020-08-25

## 2020-04-08 MED ORDER — SODIUM CHLORIDE 0.9 % IV SOLN
200.0000 mg | Freq: Once | INTRAVENOUS | Status: AC
  Administered 2020-04-08: 200 mg via INTRAVENOUS
  Filled 2020-04-08: qty 8

## 2020-04-08 MED ORDER — SODIUM CHLORIDE 0.9 % IV SOLN
152.5500 mg | Freq: Once | INTRAVENOUS | Status: AC
  Administered 2020-04-08: 150 mg via INTRAVENOUS
  Filled 2020-04-08: qty 15

## 2020-04-08 MED ORDER — SODIUM CHLORIDE 0.9 % IV SOLN
40.0000 mg | Freq: Once | INTRAVENOUS | Status: AC
  Administered 2020-04-08: 40 mg via INTRAVENOUS
  Filled 2020-04-08: qty 4

## 2020-04-08 MED ORDER — PALONOSETRON HCL INJECTION 0.25 MG/5ML
INTRAVENOUS | Status: AC
  Filled 2020-04-08: qty 5

## 2020-04-08 MED ORDER — HEPARIN SOD (PORK) LOCK FLUSH 100 UNIT/ML IV SOLN
500.0000 [IU] | Freq: Once | INTRAVENOUS | Status: AC | PRN
  Administered 2020-04-08: 500 [IU]
  Filled 2020-04-08: qty 5

## 2020-04-08 MED ORDER — METHYLPREDNISOLONE SODIUM SUCC 40 MG IJ SOLR
INTRAMUSCULAR | Status: AC
  Filled 2020-04-08: qty 1

## 2020-04-08 MED ORDER — SODIUM CHLORIDE 0.9% FLUSH
10.0000 mL | Freq: Once | INTRAVENOUS | Status: AC
  Administered 2020-04-08: 10 mL
  Filled 2020-04-08: qty 10

## 2020-04-08 MED ORDER — SODIUM CHLORIDE 0.9 % IV SOLN
Freq: Once | INTRAVENOUS | Status: AC
  Filled 2020-04-08: qty 250

## 2020-04-08 MED ORDER — PALONOSETRON HCL INJECTION 0.25 MG/5ML
0.2500 mg | Freq: Once | INTRAVENOUS | Status: AC
  Administered 2020-04-08: 0.25 mg via INTRAVENOUS

## 2020-04-08 MED ORDER — METHYLPREDNISOLONE SODIUM SUCC 40 MG IJ SOLR
20.0000 mg | Freq: Once | INTRAMUSCULAR | Status: AC
  Administered 2020-04-08: 20 mg via INTRAVENOUS

## 2020-04-08 MED ORDER — LORATADINE 10 MG PO TABS
10.0000 mg | ORAL_TABLET | Freq: Every day | ORAL | Status: DC
  Administered 2020-04-08: 10 mg via ORAL

## 2020-04-08 MED ORDER — SODIUM CHLORIDE 0.9% FLUSH
10.0000 mL | INTRAVENOUS | Status: DC | PRN
  Administered 2020-04-08: 10 mL
  Filled 2020-04-08: qty 10

## 2020-04-08 MED ORDER — SODIUM CHLORIDE 0.9 % IV SOLN
60.0000 mg/m2 | Freq: Once | INTRAVENOUS | Status: AC
  Administered 2020-04-08: 114 mg via INTRAVENOUS
  Filled 2020-04-08: qty 19

## 2020-04-08 MED ORDER — LORATADINE 10 MG PO TABS
ORAL_TABLET | ORAL | Status: AC
  Filled 2020-04-08: qty 1

## 2020-04-08 NOTE — Patient Instructions (Signed)
[facility] Discharge Instructions for Patients Receiving Chemotherapy  Today you received the following chemotherapy agents Taxol and Carboplatin.   To help prevent nausea and vomiting after your treatment, we encourage you to take your nausea medication as prescribed.    If you develop nausea and vomiting that is not controlled by your nausea medication, call the clinic.   BELOW ARE SYMPTOMS THAT SHOULD BE REPORTED IMMEDIATELY:  *FEVER GREATER THAN 100.5 F  *CHILLS WITH OR WITHOUT FEVER  NAUSEA AND VOMITING THAT IS NOT CONTROLLED WITH YOUR NAUSEA MEDICATION  *UNUSUAL SHORTNESS OF BREATH  *UNUSUAL BRUISING OR BLEEDING  TENDERNESS IN MOUTH AND THROAT WITH OR WITHOUT PRESENCE OF ULCERS  *URINARY PROBLEMS  *BOWEL PROBLEMS  UNUSUAL RASH Items with * indicate a potential emergency and should be followed up as soon as possible.  Feel free to call the clinic should you have any questions or concerns. The clinic phone number is [phone].  Please show the CHEMO ALERT CARD at check-in to the Emergency Department and triage nurse.   

## 2020-04-08 NOTE — Progress Notes (Signed)
Per Dr. Grant Ruts OK to treat with ANC 1.0

## 2020-04-08 NOTE — Patient Instructions (Signed)

## 2020-04-09 ENCOUNTER — Other Ambulatory Visit: Payer: Self-pay

## 2020-04-09 ENCOUNTER — Telehealth: Payer: Self-pay | Admitting: Hematology

## 2020-04-09 DIAGNOSIS — D701 Agranulocytosis secondary to cancer chemotherapy: Secondary | ICD-10-CM

## 2020-04-09 MED ORDER — NIVESTYM 480 MCG/0.8ML IJ SOSY: 480.0000 ug | PREFILLED_SYRINGE | INTRAMUSCULAR | 1 refills | Status: AC

## 2020-04-09 NOTE — Telephone Encounter (Signed)
Scheduled appointments per 12/2 los. Will have updated calendar printed for patient at next visit.

## 2020-04-10 ENCOUNTER — Inpatient Hospital Stay: Payer: Medicare Other

## 2020-04-10 VITALS — BP 144/82 | HR 90 | Temp 98.1°F | Resp 18

## 2020-04-10 DIAGNOSIS — Z5112 Encounter for antineoplastic immunotherapy: Secondary | ICD-10-CM | POA: Diagnosis not present

## 2020-04-10 DIAGNOSIS — Z95828 Presence of other vascular implants and grafts: Secondary | ICD-10-CM

## 2020-04-10 MED ORDER — FILGRASTIM-AAFI 480 MCG/0.8ML IJ SOSY
480.0000 ug | PREFILLED_SYRINGE | Freq: Once | INTRAMUSCULAR | Status: AC
  Administered 2020-04-10: 480 ug via SUBCUTANEOUS

## 2020-04-12 ENCOUNTER — Encounter: Payer: Self-pay | Admitting: Physical Therapy

## 2020-04-12 ENCOUNTER — Other Ambulatory Visit: Payer: Self-pay

## 2020-04-12 ENCOUNTER — Ambulatory Visit: Payer: Medicare Other | Admitting: Physical Therapy

## 2020-04-12 DIAGNOSIS — R293 Abnormal posture: Secondary | ICD-10-CM | POA: Diagnosis present

## 2020-04-12 DIAGNOSIS — C50412 Malignant neoplasm of upper-outer quadrant of left female breast: Secondary | ICD-10-CM

## 2020-04-12 DIAGNOSIS — Z170 Estrogen receptor positive status [ER+]: Secondary | ICD-10-CM | POA: Diagnosis present

## 2020-04-12 DIAGNOSIS — Z483 Aftercare following surgery for neoplasm: Secondary | ICD-10-CM

## 2020-04-12 DIAGNOSIS — M25612 Stiffness of left shoulder, not elsewhere classified: Secondary | ICD-10-CM | POA: Diagnosis present

## 2020-04-12 DIAGNOSIS — M79622 Pain in left upper arm: Secondary | ICD-10-CM

## 2020-04-12 NOTE — Addendum Note (Signed)
Addended by: Gabriela Eves on: 04/12/2020 04:17 PM   Modules accepted: Orders

## 2020-04-12 NOTE — Therapy (Addendum)
[facility] [place], [place], [address] Phone: [phone]   Fax:  [phone]  Physical Therapy Treatment  Patient Details  Name: Lindsay [name] MRN: [id number] Date of Birth: 1955/12/20 Referring Provider (PT): Dr. Autumn Messing   Encounter Date: 04/12/2020   PT End of Session - 04/12/20 1436    Visit Number 17    Number of Visits 18    Date for PT Re-Evaluation 04/23/20    PT Start Time 1696    PT Stop Time 1430    PT Time Calculation (min) 42 min    Activity Tolerance Patient tolerated treatment well    Behavior During Therapy [facility] for tasks assessed/performed           Past Medical History:  Diagnosis Date  . Bronchitis   . Family history of bone cancer   . Family history of breast cancer   . Family history of prostate cancer   . Fibromyalgia   . HCV (hepatitis C virus)   . MRSA (methicillin resistant Staphylococcus aureus) 2012    Past Surgical History:  Procedure Laterality Date  . MASTECTOMY MODIFIED RADICAL Left 12/26/2019   Procedure: LEFT MASTECTOMY MODIFIED RADICAL;  Surgeon: [name], MD;  Location: [place];  Service: General;  Laterality: Left;  PEC BLOCK  . PORTACATH PLACEMENT Right 12/26/2019   Procedure: INSERTION PORT-A-CATH WITH ULTRASOUND GUIDANCE;  Surgeon: [name], MD;  Location: [facility];  Service: General;  Laterality: Right;    There were no vitals filed for this visit.   Subjective Assessment - 04/12/20 1349    Subjective COVID 19 screening performed on patient upon arrival. Reports she has did not wear her stocking yesterday but got up leaves. Used LUE to hold leaf blower but used RUE for directing. States she is unable to detect a difference with or without stocking. Reporting numbness along dorsal surface of R great toe.    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 Patient underwent a left modified  radical mastectomy on 12/26/2019 with 13 of 16 were positive for cancer. The Ki67 is 10%.    Patient Stated Goals Get my arm better    Currently in Pain? No/denies              [facility] PT Assessment - 04/12/20 0001      Assessment   Medical Diagnosis s/p left MRM    Referring Provider (PT) Dr. Autumn Messing    Onset Date/Surgical Date 12/26/19    Hand Dominance Right    Next MD Visit unsure    Prior Therapy Baselines      Precautions   Precautions Other (comment)    Precaution Comments active cancer,       Restrictions   Weight Bearing Restrictions No                         [facility] Adult PT Treatment/Exercise - 04/12/20 0001      Shoulder Exercises: Prone   Retraction Strengthening;Left;20 reps;10 reps    Retraction Weight (lbs) 2      Shoulder Exercises: Standing   Protraction Strengthening;Left;20 reps;Theraband;10 reps    Theraband Level (Shoulder Protraction) Level 2 (Red)    External Rotation Strengthening;Left;20 reps;Theraband;10 reps    Theraband Level (Shoulder External Rotation) Level 2 (Red)    Internal Rotation Strengthening;Left;20 reps;Theraband;10 reps    Theraband Level (Shoulder Internal Rotation) Level 2 (  Red)    Flexion Strengthening;20 reps;10 reps;Weights;Left    Shoulder Flexion Weight (lbs) 2    Extension Strengthening;20 reps;Theraband;10 reps;Both    Theraband Level (Shoulder Extension) Level 2 (Red)    Row Strengthening;20 reps;Theraband;10 reps;Both    Theraband Level (Shoulder Row) Level 2 (Red)    Other Standing Exercises L shoulder scaption 2# x30 reps      Shoulder Exercises: Pulleys   Flexion 5 minutes      Shoulder Exercises: ROM/Strengthening   Wall Wash x20 reps    Other ROM/Strengthening Exercises LUE wall clock flex/scaption x10 reps                       PT Long Term Goals - 03/08/20 1430      PT LONG TERM GOAL #1   Title Patient will demonstrate she has regained full shoulder ROM and function post  operatively compared to baselines.    Time 4    Period Weeks    Status Achieved      PT LONG TERM GOAL #2   Title Patient will increase left shoulder active flexion to >/= 130 degrees for increased ease reacing overhead.    Baseline 98 degrees post op; 146 pre-op    Time 4    Period Weeks    Status Achieved      PT LONG TERM GOAL #3   Title Patient will increase left shoulder active abduction to >/= 130 degrees for ability to obtain radiation positioning.    Baseline 110 degrees post op; 151 degrees pre-op    Time 4    Period Weeks    Status Achieved      PT LONG TERM GOAL #4   Title Patient will improve the Quick DASH score to be </= 10 for improved overall upper extremity function.    Baseline 27.27    Time 4    Period Weeks    Status On-going      PT LONG TERM GOAL #5   Title Patient will verbalize good understanding of lymphedema risk reduction practices after attending the ABC class.    Time 4    Period Weeks    Status On-going                 Plan - 04/12/20 1544    Clinical Impression Statement Patient presented in clinic reporting she did not wear her compression stocking yesterday while doing yardwork. Patient guided through LUE strengthening routine without complaint of pain or fatigue. Patient had no negetive complaints during treatment. Required intermittant multimodal cueing for technique correction.    Stability/Clinical Decision Making Stable/Uncomplicated    Rehab Potential Excellent    PT Frequency 2x / week    PT Duration 4 weeks    PT Treatment/Interventions ADLs/Self Care Home Management;Therapeutic exercise;Patient/family education;Manual techniques;Passive range of motion;Scar mobilization;Therapeutic activities    PT Next Visit Plan continue LE strengthening and stability    PT Home Exercise Plan Post op shoulder ROM HEP    Consulted and Agree with Plan of Care Patient           Patient will benefit from skilled therapeutic intervention  in order to improve the following deficits and impairments:  Postural dysfunction, Decreased range of motion, Decreased knowledge of precautions, Impaired UE functional use, Pain, Increased fascial restricitons, Decreased scar mobility  Visit Diagnosis: Abnormal posture  Stiffness of left shoulder, not elsewhere classified  Pain in left upper arm  Aftercare following surgery for neoplasm  Malignant neoplasm of upper-outer quadrant of left breast in female, estrogen receptor positive [facility])     Problem List Patient Active Problem List   Diagnosis Date Noted  . Chemotherapy induced neutropenia ([place]) 03/31/2020  . Port-A-Cath in place 02/04/2020  . Family history of breast cancer   . Family history of prostate cancer   . Family history of bone cancer   . Malignant neoplasm of upper-outer quadrant of left breast in female, estrogen receptor negative ([place]) 10/31/2019    Standley Brooking, PTA 04/12/2020, 4:16 PM  [facility] [address] [place], [place], [address] Phone: [phone]   Fax:  [phone]  Name: Shan Padgett MRN: [id number] Date of Birth: November 07, 1955

## 2020-04-13 ENCOUNTER — Telehealth: Payer: Self-pay | Admitting: Hematology

## 2020-04-13 NOTE — Telephone Encounter (Signed)
R/s appt per 12/6 sch msg - called pt , no answer . Left message for patient with appt date and time .

## 2020-04-14 ENCOUNTER — Ambulatory Visit: Payer: Medicare Other | Admitting: Physical Therapy

## 2020-04-14 ENCOUNTER — Encounter: Payer: Self-pay | Admitting: Physical Therapy

## 2020-04-14 ENCOUNTER — Other Ambulatory Visit: Payer: Self-pay

## 2020-04-14 DIAGNOSIS — R293 Abnormal posture: Secondary | ICD-10-CM | POA: Diagnosis not present

## 2020-04-14 DIAGNOSIS — Z483 Aftercare following surgery for neoplasm: Secondary | ICD-10-CM

## 2020-04-14 DIAGNOSIS — M25612 Stiffness of left shoulder, not elsewhere classified: Secondary | ICD-10-CM

## 2020-04-14 DIAGNOSIS — C50412 Malignant neoplasm of upper-outer quadrant of left female breast: Secondary | ICD-10-CM

## 2020-04-14 DIAGNOSIS — M79622 Pain in left upper arm: Secondary | ICD-10-CM

## 2020-04-14 NOTE — Therapy (Signed)
[facility] [place], [place], [address] Phone: [phone]   Fax:  [phone]  Physical Therapy Treatment PHYSICAL THERAPY DISCHARGE SUMMARY  Visits from Start of Care: 18  Current functional level related to goals / functional outcomes: See below   Remaining deficits: See goals   Education / Equipment: HEP Plan: Patient agrees to discharge.  Patient goals were met. Patient is being discharged due to meeting the stated rehab goals.  ?????  **Episode remained open for cancer rehab screenings.**     Patient Details  Name: Lindsay [name] MRN: [id number] Date of Birth: 1955/08/23 Referring Provider (PT): Dr. Autumn Messing   Encounter Date: 04/14/2020   PT End of Session - 04/14/20 1305    Visit Number 18    Number of Visits 18    Date for PT Re-Evaluation 04/23/20    PT Start Time 1307    PT Stop Time 1348    PT Time Calculation (min) 41 min    Activity Tolerance Patient tolerated treatment well    Behavior During Therapy [facility] for tasks assessed/performed           Past Medical History:  Diagnosis Date  . Bronchitis   . Family history of bone cancer   . Family history of breast cancer   . Family history of prostate cancer   . Fibromyalgia   . HCV (hepatitis C virus)   . MRSA (methicillin resistant Staphylococcus aureus) 2012    Past Surgical History:  Procedure Laterality Date  . MASTECTOMY MODIFIED RADICAL Left 12/26/2019   Procedure: LEFT MASTECTOMY MODIFIED RADICAL;  Surgeon: [name], MD;  Location: [place];  Service: General;  Laterality: Left;  PEC BLOCK  . PORTACATH PLACEMENT Right 12/26/2019   Procedure: INSERTION PORT-A-CATH WITH ULTRASOUND GUIDANCE;  Surgeon: [name], MD;  Location: [facility];  Service: General;  Laterality: Right;    There were no vitals filed for this visit.   Subjective Assessment - 04/14/20 1304    Subjective COVID 19 screening performed on patient upon  arrival. No new complaints.    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 Patient underwent a left modified radical mastectomy on 12/26/2019 with 13 of 16 were positive for cancer. The Ki67 is 10%.    Patient Stated Goals Get my arm better    Currently in Pain? No/denies              [facility] PT Assessment - 04/14/20 0001      Assessment   Medical Diagnosis s/p left MRM    Referring Provider (PT) Dr. Autumn Messing    Onset Date/Surgical Date 12/26/19    Hand Dominance Right    Next MD Visit unsure    Prior Therapy Baselines      Precautions   Precautions Other (comment)    Precaution Comments active cancer,       Restrictions   Weight Bearing Restrictions No      ROM / Strength   AROM / PROM / Strength AROM      AROM   Overall AROM  Within functional limits for tasks performed    AROM Assessment Site Shoulder    Right/Left Shoulder Left    Right Shoulder Flexion 150 Degrees    Right Shoulder ABduction 150 Degrees                 Quick Dash - 04/14/20 0001  Open a tight or new jar Mild difficulty    Do heavy household chores (wash walls, wash floors) Mild difficulty    Carry a shopping bag or briefcase Moderate difficulty    Wash your back Mild difficulty    Use a knife to cut food No difficulty    Recreational activities in which you take some force or impact through your arm, shoulder, or hand (golf, hammering, tennis) Mild difficulty    During the past week, to what extent has your arm, shoulder or hand problem interfered with your normal social activities with family, friends, neighbors, or groups? Not at all    During the past week, to what extent has your arm, shoulder or hand problem limited your work or other regular daily activities Not at all    Arm, shoulder, or hand pain. Moderate    Tingling (pins and needles) in your arm, shoulder, or hand None    Difficulty Sleeping No  difficulty    DASH Score 18.18 %                  [facility] Adult PT Treatment/Exercise - 04/14/20 0001      Shoulder Exercises: Standing   Protraction Strengthening;Left;20 reps;Theraband;10 reps    Theraband Level (Shoulder Protraction) Level 3 (Green)    External Rotation Strengthening;Left;20 reps;Theraband;10 reps    Theraband Level (Shoulder External Rotation) Level 3 (Green)    Internal Rotation Strengthening;Left;20 reps;Theraband;10 reps    Theraband Level (Shoulder Internal Rotation) Level 3 (Green)    Flexion Strengthening;20 reps;10 reps;Weights;Left    Shoulder Flexion Weight (lbs) 2    Extension Strengthening;20 reps;Theraband;10 reps;Both    Theraband Level (Shoulder Extension) Level 3 (Green)      Shoulder Exercises: Pulleys   Flexion 5 minutes      Shoulder Exercises: ROM/Strengthening   Wall Wash x30 reps CW and CCW    Wall Pushups 20 reps                       PT Long Term Goals - 04/14/20 1311      PT LONG TERM GOAL #1   Title Patient will demonstrate she has regained full shoulder ROM and function post operatively compared to baselines.    Time 4    Period Weeks    Status Achieved      PT LONG TERM GOAL #2   Title Patient will increase left shoulder active flexion to >/= 130 degrees for increased ease reacing overhead.    Baseline 98 degrees post op; 146 pre-op    Time 4    Period Weeks    Status Achieved      PT LONG TERM GOAL #3   Title Patient will increase left shoulder active abduction to >/= 130 degrees for ability to obtain radiation positioning.    Baseline 110 degrees post op; 151 degrees pre-op    Time 4    Period Weeks    Status Achieved      PT LONG TERM GOAL #4   Title Patient will improve the Quick DASH score to be </= 10 for improved overall upper extremity function.    Baseline 27.27    Time 4    Period Weeks    Status Not Met      PT LONG TERM GOAL #5   Title Patient will verbalize good understanding of  lymphedema risk reduction practices after attending the ABC class.    Time 4    Period  Weeks    Status Achieved                 Plan - 04/14/20 1440    Clinical Impression Statement Patient has progressed with functional strength as well as ROM since beginning PT. Patient now has compression stockings for lymphedema control and understanding of lymphedema reduction techniques. Patient able to tolerate all therex well without complaint of pain. Patient reports completion of HEP. Patient able to achieve all orthopedic related LTGs. The patient was also encouraged to continue HEP as directed to maintain the gains from PT.    Stability/Clinical Decision Making Stable/Uncomplicated    Rehab Potential Excellent    PT Frequency 2x / week    PT Duration 4 weeks    PT Treatment/Interventions ADLs/Self Care Home Management;Therapeutic exercise;Patient/family education;Manual techniques;Passive range of motion;Scar mobilization;Therapeutic activities    PT Next Visit Plan D/C from orthopedics.    PT Home Exercise Plan Post op shoulder ROM HEP    Consulted and Agree with Plan of Care Patient           Patient will benefit from skilled therapeutic intervention in order to improve the following deficits and impairments:  Postural dysfunction, Decreased range of motion, Decreased knowledge of precautions, Impaired UE functional use, Pain, Increased fascial restricitons, Decreased scar mobility  Visit Diagnosis: Abnormal posture  Stiffness of left shoulder, not elsewhere classified  Pain in left upper arm  Aftercare following surgery for neoplasm  Malignant neoplasm of upper-outer quadrant of left breast in female, estrogen receptor positive ([place])     Problem List Patient Active Problem List   Diagnosis Date Noted  . Chemotherapy induced neutropenia ([place]) 03/31/2020  . Port-A-Cath in place 02/04/2020  . Family history of breast cancer   . Family history of prostate cancer   .  Family history of bone cancer   . Malignant neoplasm of upper-outer quadrant of left breast in female, estrogen receptor negative ([place]) 10/31/2019   Standley Brooking, PTA 04/14/20 2:44 PM   [facility] [address] [place], [place], [address] Phone: [phone]   Fax:  [phone]  Name: Lindsay [name] MRN: [id number] Date of Birth: 01-08-1956

## 2020-04-15 ENCOUNTER — Other Ambulatory Visit: Payer: Medicare Other

## 2020-04-15 ENCOUNTER — Ambulatory Visit: Payer: Medicare Other

## 2020-04-17 ENCOUNTER — Ambulatory Visit: Payer: Medicare Other

## 2020-04-20 ENCOUNTER — Telehealth: Payer: Self-pay

## 2020-04-20 NOTE — Telephone Encounter (Signed)
Lindsay [name] left message stating [facility] called wanting to ship nivestym.  The cost per shot is too expensive for Lindsay [name].  I called [facility] and cancelled nivestym rx.  I send for expedited appeal to [facility] on Friday 04/16/2020. Lindsay [name] updated.

## 2020-04-21 NOTE — Progress Notes (Signed)
[place]   [phone] [phone]   Clinic Follow up Note   Patient Care Team: Scotty Court, DO as PCP - General Mauro Kaufmann, RN as Oncology Nurse Navigator Rockwell Germany, RN as Oncology Nurse Navigator [name], MD as Consulting Physician (General Surgery) Truitt Merle, MD as Consulting Physician (Hematology) Eppie Gibson, MD as Attending Physician (Radiation Oncology)  Date of Service:  04/22/2020  CHIEF COMPLAINT: F/uof left breastmetaplasticcancer, triple negative  SUMMARY OF ONCOLOGIC HISTORY: Oncology History Overview Note  Cancer Staging Malignant neoplasm of upper-outer quadrant of left breast in female, estrogen receptor negative ([place]) Staging form: Breast, AJCC 8th Edition - Clinical stage from 10/28/2019: Stage IIIB (cT2, cN1, cM0, G3, ER-, PR-, HER2-) - Signed by Eppie Gibson, MD on 11/05/2019 - Pathologic stage from 12/26/2019: Stage IIIC (pT3, pN3a, cM0, G3, ER-, PR-, HER2-) - Signed by Truitt Merle, MD on 01/28/2020    Malignant neoplasm of upper-outer quadrant of left breast in female, estrogen receptor negative ([place])  10/27/2019 Mammogram   IMPRESSION: 1. Right breast calcifications spanning 2.6 cm in the upper slightly medial breast are indeterminate.   2. Left breast dominant mass at 3 o'clock, 6 cm from nipple measuring 3x3.9x2.9 cm is highly suspicious. There is overlying skin thickening, skin involvement is not excluded.   3. Left breast mass/distorted tissue at 1 o'clock, 4cm from nipple measuring 3.0x1.2x2.9 cm is likely in contiguity with the dominant mass at 3 o'clock and is also highly suspicious. With the dominant lesion the overall span a disease is approximately 6 cm.   4. Left breast distortion at 2 o'clock 10 cm from the nipple is Suspicious, measuring 1.0 x 0.8 cm.   5.  Abnormal left axillary lymph nodes (2). Cortical thickness measures up to 0.6 cm.    10/28/2019 Initial Biopsy    Diagnosis 1. Breast, left, needle core biopsy, 3 o'clock, 5cmfn - INVASIVE MAMMARY CARCINOMA. 2. Breast, left, needle core biopsy, 2 o'clock, 10cmfn - INVASIVE MAMMARY CARCINOMA. 3. 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 Microscopic Comment 1. -3. Overall, immunohistochemistry favors a pronounced desmoplastic response, but metaplastic carcinoma cannot be excluded (Epithelioid component: CKAE1AE3 strong +, CK5/6 weak +). The greatest linear extent of tumor in any one core in specimen 1 is 14 mm. The greatest linear extent of tumor in any one core in specimen 2 is 16 mm.   10/28/2019 Receptors her2   3. PROGNOSTIC INDICATORS Results: IMMUNOHISTOCHEMICAL AND MORPHOMETRIC ANALYSIS PERFORMED MANUALLY The tumor cells are EQUIVOCAL for Her2 (2+). Her2 by FISH will be performed and results reported separately. Estrogen Receptor: 0%, NEGATIVE Progesterone Receptor: 0%, NEGATIVE Proliferation Marker Ki67: 10%    3. FLUORESCENCE IN-SITU HYBRIDIZATION Results: GROUP 5: HER2 **NEGATIVE** Equivocal form of amplification of the HER2 gene was detected in the IHC 2+ tissue sample received from this individual. HER2 FISH was performed by a technologist and cell imaging and analysis on the [employer].   10/28/2019 Cancer Staging   Staging form: Breast, AJCC 8th Edition - Clinical stage from 10/28/2019: Stage IIIB (cT2, cN1, cM0, G3, ER-, PR-, HER2-) - Signed by Eppie Gibson, MD on 11/05/2019   10/31/2019 Initial Diagnosis   Malignant neoplasm of upper-outer quadrant of left breast in female, estrogen receptor negative ([place])   10/31/2019 Pathology Results   Diagnosis Breast, right, needle core biopsy, UOQ, posterior - FOCAL USUAL DUCTAL HYPERPLASIA AND FIBROCYSTIC CHANGES WITH CALCIFICATIONS - FIBROADENOMATOID CHANGES - NO MALIGNANCY  IDENTIFIED Microscopic Comment These results were called to [facility] on November 03, 2019.    11/05/2019 Genetic Testing   She declined Genetic testing    11/21/2019 Imaging   CT CAP w contrast  IMPRESSION: 1. Left breast mass and prominent left axillary lymph nodes, containing biopsy marking clips, consistent with newly diagnosed breast malignancy. 2. No definite evidence of distant metastatic disease in the chest, abdomen, or pelvis. 3. There are occasional small pulmonary nodules, measuring 2-3 mm, most likely incidental sequelae of prior infection or inflammation although metastatic disease is not excluded. Attention on follow-up. 4. Hepatic steatosis. 5. Aortic Atherosclerosis (ICD10-I70.0).     11/21/2019 Imaging   Bone Scan Whole Body IMPRESSION: 1. Single focus of uptake associated with a subacute fracture of LEFT anterior fourth rib. 2. No additional areas of abnormal uptake.   12/26/2019 Cancer Staging   Staging form: Breast, AJCC 8th Edition - Pathologic stage from 12/26/2019: Stage IIIC (pT3, pN3a, cM0, G3, ER-, PR-, HER2-) - Signed by Truitt Merle, MD on 01/28/2020   12/26/2019 Surgery   LEFT MASTECTOMY MODIFIED RADICAL and PAC Placement by Dr Marlou Starks   12/26/2019 Pathology Results   FINAL MICROSCOPIC DIAGNOSIS:   A. 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  - Ductal carcinoma in situ, high nuclear grade with central necrosis.  - Margins of resection:  - Metaplastic carcinoma focally involves the anterior margin and is < 1  mm from the posterior margin.  - DCIS is < 1 mm from the anterior margin.  - Metastatic carcinoma in (13) of (16) lymph nodes with extranodal  extension.  - Biopsy clip sites in breast and one lymph node.  - See oncology table.    ADDENDUM:  PROGNOSTIC INDICATOR RESULTS:  Immunohistochemical and morphometric analysis performed manually  The tumor cells are EQUIVOCAL for Her2 (2+). Her2 FISH has been ordered  and will be reported in an  addendum.  Estrogen Receptor:       NEGATIVE  Progesterone Receptor:   NEGATIVE  Proliferation Marker Ki-67:   30%    ADDENDUM:  FLOURESCENCE IN-SITU HYBRIDIZATION RESULTS:  GROUP 5:   HER2 NEGATIVE   01/28/2020 Echocardiogram   Baseline Echo  IMPRESSIONS     1. Left ventricular ejection fraction, by estimation, is 60 to 65%. The  left ventricle has normal function. The left ventricle has no regional  wall motion abnormalities. Left ventricular diastolic parameters are  consistent with Grade I diastolic  dysfunction (impaired relaxation). The average left ventricular global  longitudinal strain is -19.8 %.   2. Right ventricular systolic function is normal. The right ventricular  size is normal. Tricuspid regurgitation signal is inadequate for assessing  PA pressure.   3. The mitral valve is normal in structure. No evidence of mitral valve  regurgitation. No evidence of mitral stenosis.   4. The aortic valve is normal in structure. Aortic valve regurgitation is  not visualized. No aortic stenosis is present.   5. The inferior vena cava is normal in size with greater than 50%  respiratory variability, suggesting right atrial pressure of 3 mmHg.    02/05/2020 -  Chemotherapy   Keytruda q3weeks (to be taken for 1 whole year) with weekly Carboplatin/Taxol for 12 weeks starting 02/05/20 followed by Adriamycin/Cytoxan q2weeeks      CURRENT THERAPY:  Keytrudaq3weeks(to be taken for 1 whole year)with weeklyCarboplatin/Taxolfor 12 weeksstarting 9/30/21followed by Adriamycin/Cytoxanq2weeksX4  INTERVAL HISTORY:  Lindsay [name] is here for a follow up and treatment. She presents to the clinic alone.  She is clinically doing well.  She had to cancel her chemo treatment last week due to a sudden death of her family member.  She is tolerating chemotherapy well overall, no diarrhea, nausea.  She does notice bilateral feet numbness, more on the right no issue with balance, no fall  lately.  No neuropathy fingers and hands.  She has good appetite and energy level, weight is stable.  All other systems were reviewed with the patient and are negative.  MEDICAL HISTORY:  Past Medical History:  Diagnosis Date  . Bronchitis   . Family history of bone cancer   . Family history of breast cancer   . Family history of prostate cancer   . Fibromyalgia   . HCV (hepatitis C virus)   . MRSA (methicillin resistant Staphylococcus aureus) 2012    SURGICAL HISTORY: Past Surgical History:  Procedure Laterality Date  . MASTECTOMY MODIFIED RADICAL Left 12/26/2019   Procedure: LEFT MASTECTOMY MODIFIED RADICAL;  Surgeon: [name], MD;  Location: [facility];  Service: General;  Laterality: Left;  PEC BLOCK  . PORTACATH PLACEMENT Right 12/26/2019   Procedure: INSERTION PORT-A-CATH WITH ULTRASOUND GUIDANCE;  Surgeon: [name], MD;  Location: [facility];  Service: General;  Laterality: Right;    I have reviewed the social history and family history with the patient and they are unchanged from previous note.  ALLERGIES:  is allergic to codeine.  MEDICATIONS:  Current Outpatient Medications  Medication Sig Dispense Refill  . acetaminophen (TYLENOL) 650 MG CR tablet Take 650 mg by mouth every 8 (eight) hours as needed for pain.     Mar[place] Kitchen amoxicillin-clavulanate (AUGMENTIN) 875-125 MG tablet Take 1 tablet by mouth 2 (two) times daily. 14 tablet 0  . ascorbic acid (VITAMIN C) 250 MG CHEW Chew by mouth.    . dexamethasone (DECADRON) 4 MG tablet Take 2 tablets (8 mg total) by mouth daily. Start the day after carboplatin chemotherapy for 3 days. 30 tablet 1  . diphenoxylate-atropine (LOMOTIL) 2.5-0.025 MG tablet Take 1-2 tablets by mouth 4 (four) times daily as needed for diarrhea or loose stools. 30 tablet 1  . filgrastim-aafi (NIVESTYM) 480 MCG/0.8ML SOSY injection Inject 0.8 mLs (480 mcg total) into the skin once a week for 3 doses. 2.4 mL 1  . gabapentin (NEURONTIN) 300 MG capsule Take 300  mg by mouth 3 (three) times daily as needed.    . lidocaine-prilocaine (EMLA) cream Apply 1 application topically as needed. 30 g 2  . methocarbamol (ROBAXIN) 750 MG tablet Take 1 tablet (750 mg total) by mouth 4 (four) times daily as needed (use for muscle cramps/pain). 30 tablet 2  . omeprazole (PRILOSEC) 20 MG capsule Take 1 capsule (20 mg total) by mouth daily. 30 capsule 2  . ondansetron (ZOFRAN) 8 MG tablet Take 1 tablet (8 mg total) by mouth 2 (two) times daily as needed for refractory nausea / vomiting. Start on day 3 after carboplatin chemo. 30 tablet 1  . prochlorperazine (COMPAZINE) 10 MG tablet Take 1 tablet (10 mg total) by mouth every 6 (six) hours as needed (Nausea or vomiting). 30 tablet 1   No current facility-administered medications for this visit.   Facility-Administered Medications Ordered in Other Visits  Medication Dose Route Frequency Provider Last Rate Last Admin  . CARBOplatin (PARAPLATIN) 150 mg in sodium chloride 0.9 % 100 mL chemo infusion  150 mg Intravenous Once Truitt Merle, MD      . heparin lock flush 100 unit/mL  500 Units Intracatheter Once PRN Truitt Merle, MD      . loratadine (CLARITIN) tablet 10 mg  10 mg Oral Daily Truitt Merle, MD   10 mg at 04/22/20 1013  . PACLitaxel (TAXOL) 114 mg in sodium chloride 0.9 % 250 mL chemo infusion (</= 41m/m2)  60 mg/m2 (Treatment Plan Recorded) Intravenous Once FTruitt Merle MD 179 mL/hr at 04/22/20 1114 114 mg at 04/22/20 1114  . sodium chloride flush (NS) 0.9 % injection 10 mL  10 mL Intracatheter PRN FTruitt Merle MD        PHYSICAL EXAMINATION: ECOG PERFORMANCE STATUS: 1 - Symptomatic but completely ambulatory  Vitals:   04/22/20 0921  BP: (!) 142/73  Pulse: 78  Resp: 16  Temp: (!) 97.3 F (36.3 C)  SpO2: 100%   Filed Weights   04/22/20 0921  Weight: 186 lb 14.4 oz (84.8 kg)    GENERAL:alert, no distress and comfortable SKIN: skin color, texture, turgor are normal, no rashes or significant lesions EYES: normal,  Conjunctiva are pink and non-injected, sclera clear NECK: supple, thyroid normal size, non-tender, without nodularity LYMPH:  no palpable lymphadenopathy in the cervical, axillary  LUNGS: clear to auscultation and percussion with normal breathing effort HEART: regular rate & rhythm and no murmurs and no lower extremity edema ABDOMEN:abdomen soft, non-tender and normal bowel sounds Musculoskeletal:no cyanosis of digits and no clubbing  NEURO: alert & oriented x 3 with fluent speech, no focal motor/sensory deficits  LABORATORY DATA:  I have reviewed the data as listed CBC Latest Ref Rng & Units 04/22/2020 04/08/2020 03/31/2020  WBC 4.0 - 10.5 K/uL 4.7 3.4(L) 3.1(L)  Hemoglobin 12.0 - 15.0 g/dL 10.8(L) 10.2(L) 10.7(L)  Hematocrit 36.0 - 46.0 % 31.8(L) 30.1(L) 31.0(L)  Platelets 150 - 400 K/uL 177 183 163     CMP Latest Ref Rng & Units 04/22/2020 04/08/2020 03/31/2020  Glucose 70 - 99 mg/dL 106(H) 112(H) 112(H)  BUN 8 - 23 mg/dL 8 9 7(L)  Creatinine 0.44 - 1.00 mg/dL 0.67 0.67 0.65  Sodium 135 - 145 mmol/L 141 138 138  Potassium 3.5 - 5.1 mmol/L 3.8 3.6 3.8  Chloride 98 - 111 mmol/L 108 106 107  CO2 22 - 32 mmol/L '24 25 26  ' Calcium 8.9 - 10.3 mg/dL 9.0 8.9 8.6(L)  Total Protein 6.5 - 8.1 g/dL 6.2(L) 5.9(L) 5.9(L)  Total Bilirubin 0.3 - 1.2 mg/dL 0.4 0.3 0.6  Alkaline Phos 38 - 126 U/L 69 63 58  AST 15 - 41 U/L 37 36 37  ALT 0 - 44 U/L '14 15 14   ' S/p week 9   RADIOGRAPHIC STUDIES: I have personally reviewed the radiological images as listed and agreed with the findings in the report. No results found.   ASSESSMENT & PLAN:  FNorene Oliveriis a 64y.o. female with    1.Left breast Multifocal Metaplastic cancer,pT3N3aM0,including one unresectable axillary node, metaplastic carcinoma,ER-/PR-/HER2-, grade IIIC -She was diagnosed in 10/2019 with left breastmetaplasticcancer, triple negative disease metastatic to left axillary LN. Her 11/2019 CT CAP and bone scan was  negative for distant metastasis.  -She underwent left mastectomy with Dr TMarlou Starkson 12/26/19.Surgical pathshowed6cm multifocal metaplastic carcinomaand13/16 positive LN but unfortunately 1 positive LN was not able to be removed given it invasion ofa vessel. -She startedadjuvant chemo with Keytrudaq3weeks(to be taken for 1 whole year)withweekly Carboplatin/Taxolfor 12 weeksbeginning9/30/21followed by Adriamycin/Cytoxanq2weeksX4. -She is tolerating chemotherapy well  with slow infusions and did reduce her Taxol dose.  Mild neuropathy is stable. -Lab reviewed, adequate for treatment, will proceed [place] and Taxol today -Due to the upcoming holidays, she would like to skip chemo next week -Next cycle in 2 weeks, she has 2 more doses of couple weeks on, then will switch to [facility]  2. Hepatitis C,untreated,diagnosed in [place] her to ID or [employer] for treatment after she completed chemo. -She also notes a history of MRSA which she is not sure has completed cleared. -We will watch her liver functions closely during the chemotherapy.We discussed her that she may have hepatitis C flare when she has chemo  3. Social and financial support  -She has no children and no close relatives. She does have close friends who are supportive.  -She did not have medical insurance for many years. She recently applied for blue cross and blue shield which is not effective yet.  4. Mild anemia -Secondary to chemotherapy, mild, will continue monitoring. -Stable  PLAN: -Lab reviewed, adequate for treatment, will proceed week10carboplatin AUC 1.5, and paclitaxel 60 mg/mtoday with slow infusion(10mn)and will continue this dose for the rest of treatment weekly  -f/u in 2 weeksfor week 10 carbo/taxol    No problem-specific Assessment & Plan notes found for this encounter.   No orders of the defined types were placed in this encounter.  All questions were answered. The patient  knows to call the clinic with any problems, questions or concerns. No barriers to learning was detected. The total time spent in the appointment was 30 minutes.     YTruitt Merle MD 04/22/2020   I, AJoslyn Devon am acting as scribe for YTruitt Merle MD.   I have reviewed the above documentation for accuracy and completeness, and I agree with the above.

## 2020-04-22 ENCOUNTER — Ambulatory Visit: Payer: Medicare Other

## 2020-04-22 ENCOUNTER — Encounter: Payer: Self-pay | Admitting: Hematology

## 2020-04-22 ENCOUNTER — Inpatient Hospital Stay: Payer: Medicare Other

## 2020-04-22 ENCOUNTER — Other Ambulatory Visit: Payer: Self-pay

## 2020-04-22 ENCOUNTER — Inpatient Hospital Stay (HOSPITAL_BASED_OUTPATIENT_CLINIC_OR_DEPARTMENT_OTHER): Payer: Medicare Other | Admitting: Hematology

## 2020-04-22 ENCOUNTER — Ambulatory Visit: Payer: Medicare Other | Admitting: Hematology

## 2020-04-22 ENCOUNTER — Other Ambulatory Visit: Payer: Medicare Other

## 2020-04-22 VITALS — BP 142/73 | HR 78 | Temp 97.3°F | Resp 16 | Ht 61.0 in | Wt 186.9 lb

## 2020-04-22 DIAGNOSIS — C50412 Malignant neoplasm of upper-outer quadrant of left female breast: Secondary | ICD-10-CM

## 2020-04-22 DIAGNOSIS — Z95828 Presence of other vascular implants and grafts: Secondary | ICD-10-CM

## 2020-04-22 DIAGNOSIS — Z5112 Encounter for antineoplastic immunotherapy: Secondary | ICD-10-CM | POA: Diagnosis not present

## 2020-04-22 DIAGNOSIS — Z171 Estrogen receptor negative status [ER-]: Secondary | ICD-10-CM

## 2020-04-22 LAB — CMP (CANCER CENTER ONLY)
ALT: 14 U/L (ref 0–44)
AST: 37 U/L (ref 15–41)
Albumin: 3.8 g/dL (ref 3.5–5.0)
Alkaline Phosphatase: 69 U/L (ref 38–126)
Anion gap: 9 (ref 5–15)
BUN: 8 mg/dL (ref 8–23)
CO2: 24 mmol/L (ref 22–32)
Calcium: 9 mg/dL (ref 8.9–10.3)
Chloride: 108 mmol/L (ref 98–111)
Creatinine: 0.67 mg/dL (ref 0.44–1.00)
GFR, Estimated: >60 mL/min (ref >60)
Glucose, Bld: 106 mg/dL — ABNORMAL HIGH (ref 70–99)
Potassium: 3.8 mmol/L (ref 3.5–5.1)
Sodium: 141 mmol/L (ref 135–145)
Total Bilirubin: 0.4 mg/dL (ref 0.3–1.2)
Total Protein: 6.2 g/dL — ABNORMAL LOW (ref 6.5–8.1)

## 2020-04-22 LAB — CBC WITH DIFFERENTIAL (CANCER CENTER ONLY)
Abs Immature Granulocytes: 0.01 10*3/uL (ref 0.00–0.07)
Basophils Absolute: 0.1 10*3/uL (ref 0.0–0.1)
Basophils Relative: 1 %
Eosinophils Absolute: 0.1 10*3/uL (ref 0.0–0.5)
Eosinophils Relative: 2 %
HCT: 31.8 % — ABNORMAL LOW (ref 36.0–46.0)
Hemoglobin: 10.8 g/dL — ABNORMAL LOW (ref 12.0–15.0)
Immature Granulocytes: 0 %
Lymphocytes Relative: 39 %
Lymphs Abs: 1.8 10*3/uL (ref 0.7–4.0)
MCH: 33.5 pg (ref 26.0–34.0)
MCHC: 34 g/dL (ref 30.0–36.0)
MCV: 98.8 fL (ref 80.0–100.0)
Monocytes Absolute: 0.5 10*3/uL (ref 0.1–1.0)
Monocytes Relative: 10 %
Neutro Abs: 2.2 10*3/uL (ref 1.7–7.7)
Neutrophils Relative %: 48 %
Platelet Count: 177 10*3/uL (ref 150–400)
RBC: 3.22 MIL/uL — ABNORMAL LOW (ref 3.87–5.11)
RDW: 15.2 % (ref 11.5–15.5)
WBC Count: 4.7 10*3/uL (ref 4.0–10.5)
nRBC: 0 % (ref 0.0–0.2)

## 2020-04-22 MED ORDER — SODIUM CHLORIDE 0.9% FLUSH
10.0000 mL | INTRAVENOUS | Status: DC | PRN
  Administered 2020-04-22: 14:00:00 10 mL
  Filled 2020-04-22: qty 10

## 2020-04-22 MED ORDER — LORATADINE 10 MG PO TABS
10.0000 mg | ORAL_TABLET | Freq: Every day | ORAL | Status: DC
  Administered 2020-04-22: 10:00:00 10 mg via ORAL

## 2020-04-22 MED ORDER — SODIUM CHLORIDE 0.9 % IV SOLN
Freq: Once | INTRAVENOUS | Status: AC
  Filled 2020-04-22: qty 250

## 2020-04-22 MED ORDER — SODIUM CHLORIDE 0.9% FLUSH
10.0000 mL | Freq: Once | INTRAVENOUS | Status: AC
  Administered 2020-04-22: 09:00:00 10 mL
  Filled 2020-04-22: qty 10

## 2020-04-22 MED ORDER — HEPARIN SOD (PORK) LOCK FLUSH 100 UNIT/ML IV SOLN
500.0000 [IU] | Freq: Once | INTRAVENOUS | Status: AC | PRN
  Administered 2020-04-22: 14:00:00 500 [IU]
  Filled 2020-04-22: qty 5

## 2020-04-22 MED ORDER — METHYLPREDNISOLONE SODIUM SUCC 40 MG IJ SOLR
20.0000 mg | Freq: Once | INTRAMUSCULAR | Status: AC
  Administered 2020-04-22: 10:00:00 20 mg via INTRAVENOUS

## 2020-04-22 MED ORDER — SODIUM CHLORIDE 0.9 % IV SOLN
60.0000 mg/m2 | Freq: Once | INTRAVENOUS | Status: AC
  Administered 2020-04-22: 11:00:00 114 mg via INTRAVENOUS
  Filled 2020-04-22: qty 19

## 2020-04-22 MED ORDER — SODIUM CHLORIDE 0.9 % IV SOLN
152.5500 mg | Freq: Once | INTRAVENOUS | Status: AC
  Administered 2020-04-22: 13:00:00 150 mg via INTRAVENOUS
  Filled 2020-04-22: qty 15

## 2020-04-22 MED ORDER — METHYLPREDNISOLONE SODIUM SUCC 40 MG IJ SOLR
INTRAMUSCULAR | Status: AC
  Filled 2020-04-22: qty 1

## 2020-04-22 MED ORDER — SODIUM CHLORIDE 0.9 % IV SOLN
40.0000 mg | Freq: Once | INTRAVENOUS | Status: AC
  Administered 2020-04-22: 10:00:00 40 mg via INTRAVENOUS
  Filled 2020-04-22: qty 4

## 2020-04-22 MED ORDER — PALONOSETRON HCL INJECTION 0.25 MG/5ML
INTRAVENOUS | Status: AC
  Filled 2020-04-22: qty 5

## 2020-04-22 MED ORDER — LORATADINE 10 MG PO TABS
ORAL_TABLET | ORAL | Status: AC
  Filled 2020-04-22: qty 1

## 2020-04-22 MED ORDER — PALONOSETRON HCL INJECTION 0.25 MG/5ML: 0.2500 mg | Freq: Once | INTRAVENOUS | Status: AC

## 2020-04-22 MED ORDER — PALONOSETRON HCL INJECTION 0.25 MG/5ML
0.2500 mg | Freq: Once | INTRAVENOUS | Status: AC
  Administered 2020-04-22: 10:00:00 0.25 mg via INTRAVENOUS

## 2020-04-22 NOTE — Patient Instructions (Signed)
[facility] Discharge Instructions for Patients Receiving Chemotherapy  Today you received the following chemotherapy agents: Paclitaxel (Taxol) and Carboplatin  To help prevent nausea and vomiting after your treatment, we encourage you to take your nausea medication  as prescribed.    If you develop nausea and vomiting that is not controlled by your nausea medication, call the clinic.   BELOW ARE SYMPTOMS THAT SHOULD BE REPORTED IMMEDIATELY:  *FEVER GREATER THAN 100.5 F  *CHILLS WITH OR WITHOUT FEVER  NAUSEA AND VOMITING THAT IS NOT CONTROLLED WITH YOUR NAUSEA MEDICATION  *UNUSUAL SHORTNESS OF BREATH  *UNUSUAL BRUISING OR BLEEDING  TENDERNESS IN MOUTH AND THROAT WITH OR WITHOUT PRESENCE OF ULCERS  *URINARY PROBLEMS  *BOWEL PROBLEMS  UNUSUAL RASH Items with * indicate a potential emergency and should be followed up as soon as possible.  Feel free to call the clinic should you have any questions or concerns. The clinic phone number is [phone].  Please show the CHEMO ALERT CARD at check-in to the Emergency Department and triage nurse.   

## 2020-04-23 ENCOUNTER — Telehealth: Payer: Self-pay | Admitting: Hematology

## 2020-04-23 NOTE — Telephone Encounter (Signed)
Scheduled appts per 12/16 los. Pt to refer to mychart for appts

## 2020-04-24 ENCOUNTER — Inpatient Hospital Stay: Payer: Medicare Other

## 2020-04-24 ENCOUNTER — Other Ambulatory Visit: Payer: Self-pay

## 2020-04-24 ENCOUNTER — Ambulatory Visit: Payer: Medicare Other

## 2020-04-24 VITALS — BP 135/74 | HR 75 | Temp 97.3°F | Resp 18

## 2020-04-24 DIAGNOSIS — Z95828 Presence of other vascular implants and grafts: Secondary | ICD-10-CM

## 2020-04-24 DIAGNOSIS — Z5112 Encounter for antineoplastic immunotherapy: Secondary | ICD-10-CM | POA: Diagnosis not present

## 2020-04-24 MED ORDER — FILGRASTIM-AAFI 480 MCG/0.8ML IJ SOSY
480.0000 ug | PREFILLED_SYRINGE | Freq: Once | INTRAMUSCULAR | Status: AC
  Administered 2020-04-24: 10:00:00 480 ug via SUBCUTANEOUS

## 2020-04-24 NOTE — Patient Instructions (Signed)

## 2020-05-05 ENCOUNTER — Inpatient Hospital Stay: Payer: Medicare Other

## 2020-05-05 ENCOUNTER — Other Ambulatory Visit: Payer: Self-pay

## 2020-05-05 ENCOUNTER — Ambulatory Visit: Payer: Medicare Other

## 2020-05-05 ENCOUNTER — Inpatient Hospital Stay (HOSPITAL_BASED_OUTPATIENT_CLINIC_OR_DEPARTMENT_OTHER): Payer: Medicare Other | Admitting: Nurse Practitioner

## 2020-05-05 ENCOUNTER — Ambulatory Visit: Payer: Medicare Other | Admitting: Nurse Practitioner

## 2020-05-05 ENCOUNTER — Other Ambulatory Visit: Payer: Medicare Other

## 2020-05-05 ENCOUNTER — Telehealth: Payer: Self-pay

## 2020-05-05 VITALS — BP 138/84 | HR 96 | Temp 98.2°F | Resp 18 | Ht 61.0 in | Wt 184.2 lb

## 2020-05-05 DIAGNOSIS — Z171 Estrogen receptor negative status [ER-]: Secondary | ICD-10-CM

## 2020-05-05 DIAGNOSIS — Z5112 Encounter for antineoplastic immunotherapy: Secondary | ICD-10-CM | POA: Diagnosis not present

## 2020-05-05 DIAGNOSIS — C50412 Malignant neoplasm of upper-outer quadrant of left female breast: Secondary | ICD-10-CM

## 2020-05-05 DIAGNOSIS — Z95828 Presence of other vascular implants and grafts: Secondary | ICD-10-CM

## 2020-05-05 LAB — CMP (CANCER CENTER ONLY)
ALT: 19 U/L (ref 0–44)
AST: 54 U/L — ABNORMAL HIGH (ref 15–41)
Albumin: 3.7 g/dL (ref 3.5–5.0)
Alkaline Phosphatase: 72 U/L (ref 38–126)
Anion gap: 6 (ref 5–15)
BUN: 10 mg/dL (ref 8–23)
CO2: 27 mmol/L (ref 22–32)
Calcium: 9.1 mg/dL (ref 8.9–10.3)
Chloride: 106 mmol/L (ref 98–111)
Creatinine: 0.68 mg/dL (ref 0.44–1.00)
GFR, Estimated: >60 mL/min (ref >60)
Glucose, Bld: 110 mg/dL — ABNORMAL HIGH (ref 70–99)
Potassium: 4.1 mmol/L (ref 3.5–5.1)
Sodium: 139 mmol/L (ref 135–145)
Total Bilirubin: 0.5 mg/dL (ref 0.3–1.2)
Total Protein: 6.4 g/dL — ABNORMAL LOW (ref 6.5–8.1)

## 2020-05-05 LAB — CBC WITH DIFFERENTIAL (CANCER CENTER ONLY)
Abs Immature Granulocytes: 0.02 10*3/uL (ref 0.00–0.07)
Basophils Absolute: 0.1 10*3/uL (ref 0.0–0.1)
Basophils Relative: 1 %
Eosinophils Absolute: 0.2 10*3/uL (ref 0.0–0.5)
Eosinophils Relative: 2 %
HCT: 36.4 % (ref 36.0–46.0)
Hemoglobin: 12.3 g/dL (ref 12.0–15.0)
Immature Granulocytes: 0 %
Lymphocytes Relative: 31 %
Lymphs Abs: 1.9 10*3/uL (ref 0.7–4.0)
MCH: 34.1 pg — ABNORMAL HIGH (ref 26.0–34.0)
MCHC: 33.8 g/dL (ref 30.0–36.0)
MCV: 100.8 fL — ABNORMAL HIGH (ref 80.0–100.0)
Monocytes Absolute: 0.5 10*3/uL (ref 0.1–1.0)
Monocytes Relative: 8 %
Neutro Abs: 3.5 10*3/uL (ref 1.7–7.7)
Neutrophils Relative %: 58 %
Platelet Count: 205 10*3/uL (ref 150–400)
RBC: 3.61 MIL/uL — ABNORMAL LOW (ref 3.87–5.11)
RDW: 14.8 % (ref 11.5–15.5)
WBC Count: 6.2 10*3/uL (ref 4.0–10.5)
nRBC: 0 % (ref 0.0–0.2)

## 2020-05-05 MED ORDER — HEPARIN SOD (PORK) LOCK FLUSH 100 UNIT/ML IV SOLN
500.0000 [IU] | Freq: Once | INTRAVENOUS | Status: AC
  Administered 2020-05-05: 500 [IU]
  Filled 2020-05-05: qty 5

## 2020-05-05 MED ORDER — SODIUM CHLORIDE 0.9% FLUSH
10.0000 mL | Freq: Once | INTRAVENOUS | Status: AC
  Administered 2020-05-05: 13:00:00 10 mL
  Filled 2020-05-05: qty 10

## 2020-05-05 NOTE — Patient Instructions (Signed)

## 2020-05-05 NOTE — Progress Notes (Signed)
[place]   [phone] [phone]   Clinic Follow up Note   Patient Care Team: Scotty Court, DO as PCP - General Mauro Kaufmann, RN as Oncology Nurse Navigator Rockwell Germany, RN as Oncology Nurse Navigator [name], MD as Consulting Physician (General Surgery) Truitt Merle, MD as Consulting Physician (Hematology) Eppie Gibson, MD as Attending Physician (Radiation Oncology) Date of Service: 05/05/2020   CHIEF COMPLAINT: Follow up metaplastic left breast cancer   SUMMARY OF ONCOLOGIC HISTORY: Oncology History Overview Note  Cancer Staging Malignant neoplasm of upper-outer quadrant of left breast in female, estrogen receptor negative ([place]) Staging form: Breast, AJCC 8th Edition - Clinical stage from 10/28/2019: Stage IIIB (cT2, cN1, cM0, G3, ER-, PR-, HER2-) - Signed by Eppie Gibson, MD on 11/05/2019 - Pathologic stage from 12/26/2019: Stage IIIC (pT3, pN3a, cM0, G3, ER-, PR-, HER2-) - Signed by Truitt Merle, MD on 01/28/2020    Malignant neoplasm of upper-outer quadrant of left breast in female, estrogen receptor negative ([place])  10/27/2019 Mammogram   IMPRESSION: 1. Right breast calcifications spanning 2.6 cm in the upper slightly medial breast are indeterminate.   2. Left breast dominant mass at 3 o'clock, 6 cm from nipple measuring 3x3.9x2.9 cm is highly suspicious. There is overlying skin thickening, skin involvement is not excluded.   3. Left breast mass/distorted tissue at 1 o'clock, 4cm from nipple measuring 3.0x1.2x2.9 cm is likely in contiguity with the dominant mass at 3 o'clock and is also highly suspicious. With the dominant lesion the overall span a disease is approximately 6 cm.   4. Left breast distortion at 2 o'clock 10 cm from the nipple is Suspicious, measuring 1.0 x 0.8 cm.   5.  Abnormal left axillary lymph nodes (2). Cortical thickness measures up to 0.6 cm.    10/28/2019 Initial Biopsy   Diagnosis 1. Breast,  left, needle core biopsy, 3 o'clock, 5cmfn - INVASIVE MAMMARY CARCINOMA. 2. Breast, left, needle core biopsy, 2 o'clock, 10cmfn - INVASIVE MAMMARY CARCINOMA. 3. 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 Microscopic Comment 1. -3. Overall, immunohistochemistry favors a pronounced desmoplastic response, but metaplastic carcinoma cannot be excluded (Epithelioid component: CKAE1AE3 strong +, CK5/6 weak +). The greatest linear extent of tumor in any one core in specimen 1 is 14 mm. The greatest linear extent of tumor in any one core in specimen 2 is 16 mm.   10/28/2019 Receptors her2   3. PROGNOSTIC INDICATORS Results: IMMUNOHISTOCHEMICAL AND MORPHOMETRIC ANALYSIS PERFORMED MANUALLY The tumor cells are EQUIVOCAL for Her2 (2+). Her2 by FISH will be performed and results reported separately. Estrogen Receptor: 0%, NEGATIVE Progesterone Receptor: 0%, NEGATIVE Proliferation Marker Ki67: 10%    3. FLUORESCENCE IN-SITU HYBRIDIZATION Results: GROUP 5: HER2 **NEGATIVE** Equivocal form of amplification of the HER2 gene was detected in the IHC 2+ tissue sample received from this individual. HER2 FISH was performed by a technologist and cell imaging and analysis on the [employer].   10/28/2019 Cancer Staging   Staging form: Breast, AJCC 8th Edition - Clinical stage from 10/28/2019: Stage IIIB (cT2, cN1, cM0, G3, ER-, PR-, HER2-) - Signed by Eppie Gibson, MD on 11/05/2019   10/31/2019 Initial Diagnosis   Malignant neoplasm of upper-outer quadrant of left breast in female, estrogen receptor negative ([place])   10/31/2019 Pathology Results   Diagnosis Breast, right, needle core biopsy, UOQ, posterior - FOCAL USUAL DUCTAL HYPERPLASIA AND FIBROCYSTIC CHANGES WITH CALCIFICATIONS - FIBROADENOMATOID CHANGES - NO  MALIGNANCY IDENTIFIED Microscopic Comment These results were called to [facility] on November 03, 2019.   11/05/2019 Genetic  Testing   She declined Genetic testing    11/21/2019 Imaging   CT CAP w contrast  IMPRESSION: 1. Left breast mass and prominent left axillary lymph nodes, containing biopsy marking clips, consistent with newly diagnosed breast malignancy. 2. No definite evidence of distant metastatic disease in the chest, abdomen, or pelvis. 3. There are occasional small pulmonary nodules, measuring 2-3 mm, most likely incidental sequelae of prior infection or inflammation although metastatic disease is not excluded. Attention on follow-up. 4. Hepatic steatosis. 5. Aortic Atherosclerosis (ICD10-I70.0).     11/21/2019 Imaging   Bone Scan Whole Body IMPRESSION: 1. Single focus of uptake associated with a subacute fracture of LEFT anterior fourth rib. 2. No additional areas of abnormal uptake.   12/26/2019 Cancer Staging   Staging form: Breast, AJCC 8th Edition - Pathologic stage from 12/26/2019: Stage IIIC (pT3, pN3a, cM0, G3, ER-, PR-, HER2-) - Signed by Truitt Merle, MD on 01/28/2020   12/26/2019 Surgery   LEFT MASTECTOMY MODIFIED RADICAL and PAC Placement by Dr Marlou Starks   12/26/2019 Pathology Results   FINAL MICROSCOPIC DIAGNOSIS:   A. 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  - Ductal carcinoma in situ, high nuclear grade with central necrosis.  - Margins of resection:  - Metaplastic carcinoma focally involves the anterior margin and is < 1  mm from the posterior margin.  - DCIS is < 1 mm from the anterior margin.  - Metastatic carcinoma in (13) of (16) lymph nodes with extranodal  extension.  - Biopsy clip sites in breast and one lymph node.  - See oncology table.    ADDENDUM:  PROGNOSTIC INDICATOR RESULTS:  Immunohistochemical and morphometric analysis performed manually  The tumor cells are EQUIVOCAL for Her2 (2+). Her2 FISH has been ordered  and will be reported in an addendum.  Estrogen  Receptor:       NEGATIVE  Progesterone Receptor:   NEGATIVE  Proliferation Marker Ki-67:   30%    ADDENDUM:  FLOURESCENCE IN-SITU HYBRIDIZATION RESULTS:  GROUP 5:   HER2 NEGATIVE   01/28/2020 Echocardiogram   Baseline Echo  IMPRESSIONS     1. Left ventricular ejection fraction, by estimation, is 60 to 65%. The  left ventricle has normal function. The left ventricle has no regional  wall motion abnormalities. Left ventricular diastolic parameters are  consistent with Grade I diastolic  dysfunction (impaired relaxation). The average left ventricular global  longitudinal strain is -19.8 %.   2. Right ventricular systolic function is normal. The right ventricular  size is normal. Tricuspid regurgitation signal is inadequate for assessing  PA pressure.   3. The mitral valve is normal in structure. No evidence of mitral valve  regurgitation. No evidence of mitral stenosis.   4. The aortic valve is normal in structure. Aortic valve regurgitation is  not visualized. No aortic stenosis is present.   5. The inferior vena cava is normal in size with greater than 50%  respiratory variability, suggesting right atrial pressure of 3 mmHg.    02/05/2020 -  Chemotherapy   Keytruda q3weeks (to be taken for 1 whole year) with weekly Carboplatin/Taxol for 12 weeks starting 02/05/20 followed by Adriamycin/Cytoxan q2weeeks     CURRENT THERAPY:  Keytrudaq3weeks(to be taken for 1 whole year)with weeklyCarboplatin/Taxolfor 12 weeksstarting 9/30/21followed by Adriamycin/Cytoxanq2weeksX4  INTERVAL HISTORY: Ms. Pounds returns for follow up and treatment. She continues weekly carbo/taxol and q3 weeks [place]. She took last week off for Christmas which she enjoyed. Due week 11. Energy and appetite are adequate. Feet swell and she has more neuropathy (pre-dates chemo) if she is very active but improves with rest. Neuropathy stable on gabapentin 100 mg PRN. She has occasional phantom pain in left breast.  She is doing PT exercises. Has pain in right hip after filgrastim injection. Mild DOE is stable she relates to chronic bronchitis. Denies fever, chills, cough, chest pain, dyspnea, leg edema, rash, or other concerns.    MEDICAL HISTORY:  Past Medical History:  Diagnosis Date  . Bronchitis   . Family history of bone cancer   . Family history of breast cancer   . Family history of prostate cancer   . Fibromyalgia   . HCV (hepatitis C virus)   . MRSA (methicillin resistant Staphylococcus aureus) 2012    SURGICAL HISTORY: Past Surgical History:  Procedure Laterality Date  . MASTECTOMY MODIFIED RADICAL Left 12/26/2019   Procedure: LEFT MASTECTOMY MODIFIED RADICAL;  Surgeon: [name], MD;  Location: [facility];  Service: General;  Laterality: Left;  PEC BLOCK  . PORTACATH PLACEMENT Right 12/26/2019   Procedure: INSERTION PORT-A-CATH WITH ULTRASOUND GUIDANCE;  Surgeon: [name], MD;  Location: [facility];  Service: General;  Laterality: Right;    I have reviewed the social history and family history with the patient and they are unchanged from previous note.  ALLERGIES:  is allergic to codeine.  MEDICATIONS:  Current Outpatient Medications  Medication Sig Dispense Refill  . acetaminophen (TYLENOL) 650 MG CR tablet Take 650 mg by mouth every 8 (eight) hours as needed for pain.     Marland Kitchen amoxicillin-clavulanate (AUGMENTIN) 875-125 MG tablet Take 1 tablet by mouth 2 (two) times daily. 14 tablet 0  . ascorbic acid (VITAMIN C) 250 MG CHEW Chew by mouth.    . dexamethasone (DECADRON) 4 MG tablet Take 2 tablets (8 mg total) by mouth daily. Start the day after carboplatin chemotherapy for 3 days. 30 tablet 1  . diphenoxylate-atropine (LOMOTIL) 2.5-0.025 MG tablet Take 1-2 tablets by mouth 4 (four) times daily as needed for diarrhea or loose stools. 30 tablet 1  . gabapentin (NEURONTIN) 300 MG capsule Take 300 mg by mouth 3 (three) times daily as needed.    . lidocaine-prilocaine (EMLA) cream Apply  1 application topically as needed. 30 g 2  . methocarbamol (ROBAXIN) 750 MG tablet Take 1 tablet (750 mg total) by mouth 4 (four) times daily as needed (use for muscle cramps/pain). 30 tablet 2  . omeprazole (PRILOSEC) 20 MG capsule Take 1 capsule (20 mg total) by mouth daily. 30 capsule 2  . ondansetron (ZOFRAN) 8 MG tablet Take 1 tablet (8 mg total) by mouth 2 (two) times daily as needed for refractory nausea / vomiting. Start on day 3 after carboplatin chemo. 30 tablet 1  . prochlorperazine (COMPAZINE) 10 MG tablet Take 1 tablet (10 mg total) by mouth every 6 (six) hours as needed (Nausea or vomiting). 30 tablet 1   No current facility-administered medications for this visit.    PHYSICAL EXAMINATION: ECOG PERFORMANCE STATUS: 1 - Symptomatic but completely ambulatory  Vitals:   05/05/20 1407  BP: 138/84  Pulse: 96  Resp: 18  Temp: 98.2 F (36.8 C)  SpO2: 99%   Filed Weights   05/05/20 1407  Weight: 184 lb 3.2 oz (83.6 kg)  GENERAL:alert, no distress and comfortable SKIN: no rash  EYES:  sclera clear OROPHARYNX: no thrush or ulcers LUNGS:  normal breathing effort HEART: no lower extremity edema NEURO: alert & oriented x 3 with fluent speech, no focal motor/sensory deficits Breast: s/p left mastectomy. Incision completely healed. Small palpable left ax LN  PAC without erythema   LABORATORY DATA:  I have reviewed the data as listed CBC Latest Ref Rng & Units 05/05/2020 04/22/2020 04/08/2020  WBC 4.0 - 10.5 K/uL 6.2 4.7 3.4(L)  Hemoglobin 12.0 - 15.0 g/dL 12.3 10.8(L) 10.2(L)  Hematocrit 36.0 - 46.0 % 36.4 31.8(L) 30.1(L)  Platelets 150 - 400 K/uL 205 177 183     CMP Latest Ref Rng & Units 05/05/2020 04/22/2020 04/08/2020  Glucose 70 - 99 mg/dL 110(H) 106(H) 112(H)  BUN 8 - 23 mg/dL _0 Creatinine 0.44 - 1.00 mg/dL 0.68 0.67 0.67  Sodium 135 - 145 mmol/L 139 141 138  Potassium 3.5 - 5.1 mmol/L 4.1 3.8 3.6  Chloride 98 - 111 mmol/L 106 108 106  CO2 22 - 32  mmol/L _1 Calcium 8.9 - 10.3 mg/dL 9.1 9.0 8.9  Total Protein 6.5 - 8.1 g/dL 6.4(L) 6.2(L) 5.9(L)  Total Bilirubin 0.3 - 1.2 mg/dL 0.5 0.4 0.3  Alkaline Phos 38 - 126 U/L 72 69 63  AST 15 - 41 U/L 54(H) 37 36  ALT 0 - 44 U/L _2 RADIOGRAPHIC STUDIES: I have personally reviewed the radiological images as listed and agreed with the findings in the report. No results found.   ASSESSMENT & PLAN: Lindsay [name] is a 64 y.o. female with    1.Left breast Multifocal Metaplastic cancer,pT3N3aM0,including one unresectable axillary node, metaplastic carcinoma,ER-/PR-/HER2-, grade IIIC -She was diagnosed in 10/2019 with left breastmetaplasticcancer, triple negative disease metastatic to left axillary LN. Her 11/2019 CT CAP and bone scan was negative for distant metastasis.  -S/p left mastectomy with Dr Marlou Starks on 12/26/19.Surgical pathshowed6cm multifocal metaplastic carcinomaand13/16 positive LN but unfortunately 1 positive LN was not able to be removed due to vascular invasion. -She startedadjuvant chemo with Keytrudaq3weeks(to be taken for 1 whole year)withweekly Carboplatin/Taxolfor 12 weeksbeginning9/30/21follow[facility] x4. -tolerating treatment with dose-reduced taxol   2. Hepatitis C,untreated,diagnosed in 1992 -pending referral to [facility] or [facility] for treatment after she completed chemo. -monitoring LFTs and for possible hep C flare while on chemo/immunotherapy  3. Social and financial support  -She has no children and no close relatives. She does have close friends and church community who are supportive and strong faith.  -She did not have medical insurance for many years. She recently applied for blue cross and blue shield which is not effective yet.  4. Mild anemia -Secondary to chemotherapy, mild, will continue monitoring. -Stable   Dispo:  Ms. Regas appears stable. She completed 10 cycles of weekly carboplatin and taxol  and continues q3 week pembrolizumab. She tolerates treatment well without significant toxicities. Baseline DOE and neuropathy are stable. She is able to recover and function well.   Breast exam shows a small palpable left axillary LN. Labs adequate to proceed with cycle 11 weekly carboplatin and taxol, and cycle 5 pembrolizumab on 05/06/20. She will not need filgrastim this cycle.   She will return for last carbo/taxol next week, then follow up and start AC in 2 weeks. Next pembrolizumab due in 3 weeks.   All questions were answered. The patient knows to call the clinic  with any problems, questions or concerns. No barriers to learning were detected.     Alla Feeling, NP 05/06/20

## 2020-05-05 NOTE — Telephone Encounter (Signed)
Message left concerning appt for injection sat 05/08/20 date and time left

## 2020-05-06 ENCOUNTER — Other Ambulatory Visit: Payer: Self-pay

## 2020-05-06 ENCOUNTER — Encounter: Payer: Self-pay | Admitting: Nurse Practitioner

## 2020-05-06 ENCOUNTER — Encounter: Payer: Self-pay | Admitting: *Deleted

## 2020-05-06 ENCOUNTER — Ambulatory Visit: Payer: Medicare Other

## 2020-05-06 ENCOUNTER — Inpatient Hospital Stay: Payer: Medicare Other

## 2020-05-06 VITALS — BP 119/78 | HR 92 | Temp 98.1°F | Resp 16

## 2020-05-06 DIAGNOSIS — Z171 Estrogen receptor negative status [ER-]: Secondary | ICD-10-CM

## 2020-05-06 DIAGNOSIS — Z5112 Encounter for antineoplastic immunotherapy: Secondary | ICD-10-CM | POA: Diagnosis not present

## 2020-05-06 MED ORDER — SODIUM CHLORIDE 0.9 % IV SOLN
200.0000 mg | Freq: Once | INTRAVENOUS | Status: AC
  Administered 2020-05-06: 11:00:00 200 mg via INTRAVENOUS
  Filled 2020-05-06: qty 8

## 2020-05-06 MED ORDER — SODIUM CHLORIDE 0.9 % IV SOLN
152.5500 mg | Freq: Once | INTRAVENOUS | Status: AC
Start: 2020-05-06 — End: 2020-05-06
  Administered 2020-05-06: 13:00:00 150 mg via INTRAVENOUS
  Filled 2020-05-06: qty 15

## 2020-05-06 MED ORDER — LORATADINE 10 MG PO TABS
ORAL_TABLET | ORAL | Status: AC
  Filled 2020-05-06: qty 1

## 2020-05-06 MED ORDER — LORATADINE 10 MG PO TABS
10.0000 mg | ORAL_TABLET | Freq: Every day | ORAL | Status: DC
  Administered 2020-05-06: 10:00:00 10 mg via ORAL

## 2020-05-06 MED ORDER — SODIUM CHLORIDE 0.9 % IV SOLN
60.0000 mg/m2 | Freq: Once | INTRAVENOUS | Status: AC
  Administered 2020-05-06: 12:00:00 114 mg via INTRAVENOUS
  Filled 2020-05-06: qty 19

## 2020-05-06 MED ORDER — PALONOSETRON HCL INJECTION 0.25 MG/5ML
INTRAVENOUS | Status: AC
  Filled 2020-05-06: qty 5

## 2020-05-06 MED ORDER — PALONOSETRON HCL INJECTION 0.25 MG/5ML
0.2500 mg | Freq: Once | INTRAVENOUS | Status: AC
  Administered 2020-05-06: 10:00:00 0.25 mg via INTRAVENOUS

## 2020-05-06 MED ORDER — HEPARIN SOD (PORK) LOCK FLUSH 100 UNIT/ML IV SOLN
500.0000 [IU] | Freq: Once | INTRAVENOUS | Status: AC | PRN
  Administered 2020-05-06: 13:00:00 500 [IU]
  Filled 2020-05-06: qty 5

## 2020-05-06 MED ORDER — SODIUM CHLORIDE 0.9 % IV SOLN
Freq: Once | INTRAVENOUS | Status: AC
  Filled 2020-05-06: qty 250

## 2020-05-06 MED ORDER — METHYLPREDNISOLONE SODIUM SUCC 40 MG IJ SOLR
INTRAMUSCULAR | Status: AC
  Filled 2020-05-06: qty 1

## 2020-05-06 MED ORDER — SODIUM CHLORIDE 0.9% FLUSH
10.0000 mL | INTRAVENOUS | Status: DC | PRN
  Administered 2020-05-06: 13:00:00 10 mL
  Filled 2020-05-06: qty 10

## 2020-05-06 MED ORDER — SODIUM CHLORIDE 0.9 % IV SOLN
40.0000 mg | Freq: Once | INTRAVENOUS | Status: AC
  Administered 2020-05-06: 10:00:00 40 mg via INTRAVENOUS
  Filled 2020-05-06: qty 4

## 2020-05-06 MED ORDER — METHYLPREDNISOLONE SODIUM SUCC 40 MG IJ SOLR
20.0000 mg | Freq: Once | INTRAMUSCULAR | Status: AC
  Administered 2020-05-06: 10:00:00 20 mg via INTRAVENOUS

## 2020-05-06 NOTE — Patient Instructions (Signed)
[facility] Discharge Instructions for Patients Receiving Chemotherapy  Today you received the following chemotherapy agents: pembrolizumab, paclitaxel, and carboplatin.  To help prevent nausea and vomiting after your treatment, we encourage you to take your nausea medication as directed.   If you develop nausea and vomiting that is not controlled by your nausea medication, call the clinic.   BELOW ARE SYMPTOMS THAT SHOULD BE REPORTED IMMEDIATELY:  *FEVER GREATER THAN 100.5 F  *CHILLS WITH OR WITHOUT FEVER  NAUSEA AND VOMITING THAT IS NOT CONTROLLED WITH YOUR NAUSEA MEDICATION  *UNUSUAL SHORTNESS OF BREATH  *UNUSUAL BRUISING OR BLEEDING  TENDERNESS IN MOUTH AND THROAT WITH OR WITHOUT PRESENCE OF ULCERS  *URINARY PROBLEMS  *BOWEL PROBLEMS  UNUSUAL RASH Items with * indicate a potential emergency and should be followed up as soon as possible.  Feel free to call the clinic should you have any questions or concerns. The clinic phone number is [phone].  Please show the CHEMO ALERT CARD at check-in to the Emergency Department and triage nurse.   

## 2020-05-08 ENCOUNTER — Inpatient Hospital Stay: Payer: Medicare Other

## 2020-05-08 ENCOUNTER — Ambulatory Visit: Payer: Medicare Other

## 2020-05-10 ENCOUNTER — Telehealth: Payer: Self-pay | Admitting: Nurse Practitioner

## 2020-05-10 NOTE — Telephone Encounter (Signed)
Scheduled appointments per 12/29 los. Called patient, no answer. Left message with appointments dates and times. Will have updated calendar printed for patient at next visit.

## 2020-05-13 ENCOUNTER — Inpatient Hospital Stay: Payer: Medicare Other

## 2020-05-13 ENCOUNTER — Ambulatory Visit: Payer: Medicare Other | Attending: General Surgery | Admitting: Rehabilitation

## 2020-05-13 ENCOUNTER — Other Ambulatory Visit: Payer: Self-pay

## 2020-05-13 ENCOUNTER — Inpatient Hospital Stay: Payer: Medicare Other | Attending: Hematology

## 2020-05-13 VITALS — BP 126/74 | HR 86 | Temp 98.7°F | Resp 17

## 2020-05-13 DIAGNOSIS — Z79899 Other long term (current) drug therapy: Secondary | ICD-10-CM | POA: Insufficient documentation

## 2020-05-13 DIAGNOSIS — C50412 Malignant neoplasm of upper-outer quadrant of left female breast: Secondary | ICD-10-CM | POA: Insufficient documentation

## 2020-05-13 DIAGNOSIS — Z5112 Encounter for antineoplastic immunotherapy: Secondary | ICD-10-CM | POA: Diagnosis present

## 2020-05-13 DIAGNOSIS — Z171 Estrogen receptor negative status [ER-]: Secondary | ICD-10-CM | POA: Diagnosis not present

## 2020-05-13 DIAGNOSIS — C773 Secondary and unspecified malignant neoplasm of axilla and upper limb lymph nodes: Secondary | ICD-10-CM | POA: Diagnosis not present

## 2020-05-13 DIAGNOSIS — Z5189 Encounter for other specified aftercare: Secondary | ICD-10-CM | POA: Insufficient documentation

## 2020-05-13 DIAGNOSIS — D6481 Anemia due to antineoplastic chemotherapy: Secondary | ICD-10-CM | POA: Diagnosis not present

## 2020-05-13 DIAGNOSIS — Z483 Aftercare following surgery for neoplasm: Secondary | ICD-10-CM | POA: Insufficient documentation

## 2020-05-13 DIAGNOSIS — B1920 Unspecified viral hepatitis C without hepatic coma: Secondary | ICD-10-CM | POA: Insufficient documentation

## 2020-05-13 DIAGNOSIS — Z5111 Encounter for antineoplastic chemotherapy: Secondary | ICD-10-CM | POA: Insufficient documentation

## 2020-05-13 LAB — CBC WITH DIFFERENTIAL (CANCER CENTER ONLY)
Abs Immature Granulocytes: 0.02 10*3/uL (ref 0.00–0.07)
Basophils Absolute: 0 10*3/uL (ref 0.0–0.1)
Basophils Relative: 1 %
Eosinophils Absolute: 0.1 10*3/uL (ref 0.0–0.5)
Eosinophils Relative: 3 %
HCT: 31.4 % — ABNORMAL LOW (ref 36.0–46.0)
Hemoglobin: 10.9 g/dL — ABNORMAL LOW (ref 12.0–15.0)
Immature Granulocytes: 0 %
Lymphocytes Relative: 43 %
Lymphs Abs: 2 10*3/uL (ref 0.7–4.0)
MCH: 34.5 pg — ABNORMAL HIGH (ref 26.0–34.0)
MCHC: 34.7 g/dL (ref 30.0–36.0)
MCV: 99.4 fL (ref 80.0–100.0)
Monocytes Absolute: 0.4 10*3/uL (ref 0.1–1.0)
Monocytes Relative: 9 %
Neutro Abs: 2 10*3/uL (ref 1.7–7.7)
Neutrophils Relative %: 44 %
Platelet Count: 176 10*3/uL (ref 150–400)
RBC: 3.16 MIL/uL — ABNORMAL LOW (ref 3.87–5.11)
RDW: 13.9 % (ref 11.5–15.5)
WBC Count: 4.6 10*3/uL (ref 4.0–10.5)
nRBC: 0 % (ref 0.0–0.2)

## 2020-05-13 LAB — CMP (CANCER CENTER ONLY)
ALT: 16 U/L (ref 0–44)
AST: 38 U/L (ref 15–41)
Albumin: 3.6 g/dL (ref 3.5–5.0)
Alkaline Phosphatase: 74 U/L (ref 38–126)
Anion gap: 8 (ref 5–15)
BUN: 12 mg/dL (ref 8–23)
CO2: 24 mmol/L (ref 22–32)
Calcium: 8.8 mg/dL — ABNORMAL LOW (ref 8.9–10.3)
Chloride: 105 mmol/L (ref 98–111)
Creatinine: 0.71 mg/dL (ref 0.44–1.00)
GFR, Estimated: >60 mL/min (ref >60)
Glucose, Bld: 108 mg/dL — ABNORMAL HIGH (ref 70–99)
Potassium: 3.6 mmol/L (ref 3.5–5.1)
Sodium: 137 mmol/L (ref 135–145)
Total Bilirubin: 0.6 mg/dL (ref 0.3–1.2)
Total Protein: 6.2 g/dL — ABNORMAL LOW (ref 6.5–8.1)

## 2020-05-13 MED ORDER — SODIUM CHLORIDE 0.9 % IV SOLN
40.0000 mg | Freq: Once | INTRAVENOUS | Status: AC
  Administered 2020-05-13: 40 mg via INTRAVENOUS
  Filled 2020-05-13: qty 4

## 2020-05-13 MED ORDER — LORATADINE 10 MG PO TABS
10.0000 mg | ORAL_TABLET | Freq: Every day | ORAL | Status: DC
  Administered 2020-05-13: 10 mg via ORAL

## 2020-05-13 MED ORDER — SODIUM CHLORIDE 0.9 % IV SOLN
152.5500 mg | Freq: Once | INTRAVENOUS | Status: AC
  Administered 2020-05-13: 150 mg via INTRAVENOUS
  Filled 2020-05-13: qty 15

## 2020-05-13 MED ORDER — SODIUM CHLORIDE 0.9% FLUSH
10.0000 mL | INTRAVENOUS | Status: DC | PRN
  Administered 2020-05-13: 10 mL
  Filled 2020-05-13: qty 10

## 2020-05-13 MED ORDER — PALONOSETRON HCL INJECTION 0.25 MG/5ML
INTRAVENOUS | Status: AC
  Filled 2020-05-13: qty 5

## 2020-05-13 MED ORDER — LORATADINE 10 MG PO TABS
ORAL_TABLET | ORAL | Status: AC
  Filled 2020-05-13: qty 1

## 2020-05-13 MED ORDER — PALONOSETRON HCL INJECTION 0.25 MG/5ML
0.2500 mg | Freq: Once | INTRAVENOUS | Status: AC
  Administered 2020-05-13: 0.25 mg via INTRAVENOUS

## 2020-05-13 MED ORDER — HEPARIN SOD (PORK) LOCK FLUSH 100 UNIT/ML IV SOLN
500.0000 [IU] | Freq: Once | INTRAVENOUS | Status: AC | PRN
  Administered 2020-05-13: 500 [IU]
  Filled 2020-05-13: qty 5

## 2020-05-13 MED ORDER — SODIUM CHLORIDE 0.9 % IV SOLN
60.0000 mg/m2 | Freq: Once | INTRAVENOUS | Status: AC
  Administered 2020-05-13: 114 mg via INTRAVENOUS
  Filled 2020-05-13: qty 19

## 2020-05-13 MED ORDER — SODIUM CHLORIDE 0.9 % IV SOLN
Freq: Once | INTRAVENOUS | Status: AC
  Filled 2020-05-13: qty 250

## 2020-05-13 MED ORDER — METHYLPREDNISOLONE SODIUM SUCC 40 MG IJ SOLR
20.0000 mg | Freq: Once | INTRAMUSCULAR | Status: AC
  Administered 2020-05-13: 20 mg via INTRAVENOUS

## 2020-05-13 MED ORDER — METHYLPREDNISOLONE SODIUM SUCC 40 MG IJ SOLR
INTRAMUSCULAR | Status: AC
  Filled 2020-05-13: qty 1

## 2020-05-13 NOTE — Patient Instructions (Signed)
[facility] Discharge Instructions for Patients Receiving Chemotherapy  Today you received the following chemotherapy agents Taxol, Carboplatin  To help prevent nausea and vomiting after your treatment, we encourage you to take your nausea medication as directed  If you develop nausea and vomiting that is not controlled by your nausea medication, call the clinic.   BELOW ARE SYMPTOMS THAT SHOULD BE REPORTED IMMEDIATELY:  *FEVER GREATER THAN 100.5 F  *CHILLS WITH OR WITHOUT FEVER  NAUSEA AND VOMITING THAT IS NOT CONTROLLED WITH YOUR NAUSEA MEDICATION  *UNUSUAL SHORTNESS OF BREATH  *UNUSUAL BRUISING OR BLEEDING  TENDERNESS IN MOUTH AND THROAT WITH OR WITHOUT PRESENCE OF ULCERS  *URINARY PROBLEMS  *BOWEL PROBLEMS  UNUSUAL RASH Items with * indicate a potential emergency and should be followed up as soon as possible.  Feel free to call the clinic should you have any questions or concerns. The clinic phone number is [phone].  Please show the CHEMO ALERT CARD at check-in to the Emergency Department and triage nurse.   

## 2020-05-13 NOTE — Therapy (Signed)
[place], [place], [address] Phone: [phone]   Fax:  [phone]  Physical Therapy Treatment  Patient Details  Name: Lindsay [name] MRN: [id number] Date of Birth: 1956-03-25 Referring Provider (PT): Dr. Autumn Messing   Encounter Date: 05/13/2020   PT End of Session - 05/13/20 1522    Visit Number 18   screen only   Number of Visits 18    PT Start Time 7493    PT Stop Time 1520    PT Time Calculation (min) 38 min    Activity Tolerance Patient tolerated treatment well    Behavior During Therapy [facility] for tasks assessed/performed           Past Medical History:  Diagnosis Date  . Bronchitis   . Family history of bone cancer   . Family history of breast cancer   . Family history of prostate cancer   . Fibromyalgia   . HCV (hepatitis C virus)   . MRSA (methicillin resistant Staphylococcus aureus) 2012    Past Surgical History:  Procedure Laterality Date  . MASTECTOMY MODIFIED RADICAL Left 12/26/2019   Procedure: LEFT MASTECTOMY MODIFIED RADICAL;  Surgeon: [name], MD;  Location: [facility];  Service: General;  Laterality: Left;  PEC BLOCK  . PORTACATH PLACEMENT Right 12/26/2019   Procedure: INSERTION PORT-A-CATH WITH ULTRASOUND GUIDANCE;  Surgeon: [name], MD;  Location: [facility];  Service: General;  Laterality: Right;    There were no vitals filed for this visit.   Subjective Assessment - 05/13/20 1441    Subjective Here for SOZO screen. I really haven't worn my sleeve i just dont' like it.    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 Patient underwent a left modified radical mastectomy on 12/26/2019 with 13 of 16 were positive for cancer. The Ki67 is 10%.                  L-DEX FLOWSHEETS - 05/13/20 1500      L-DEX LYMPHEDEMA SCREENING   Measurement Type Unilateral    L-DEX MEASUREMENT EXTREMITY  Upper Extremity    POSITION  Standing    DOMINANT SIDE Right    At Risk Side Left    BASELINE SCORE (UNILATERAL) 7.4    L-DEX SCORE (UNILATERAL) 16.7    VALUE CHANGE (UNILAT) 9.3                                  PT Long Term Goals - 04/14/20 1311      PT LONG TERM GOAL #1   Title Patient will demonstrate she has regained full shoulder ROM and function post operatively compared to baselines.    Time 4    Period Weeks    Status Achieved      PT LONG TERM GOAL #2   Title Patient will increase left shoulder active flexion to >/= 130 degrees for increased ease reacing overhead.    Baseline 98 degrees post op; 146 pre-op    Time 4    Period Weeks    Status Achieved      PT LONG TERM GOAL #3   Title Patient will increase left shoulder active abduction to >/= 130 degrees for ability to obtain radiation positioning.    Baseline 110 degrees post op; 151 degrees pre-op    Time 4  Period Weeks    Status Achieved      PT LONG TERM GOAL #4   Title Patient will improve the Quick DASH score to be </= 10 for improved overall upper extremity function.    Baseline 27.27    Time 4    Period Weeks    Status Not Met      PT LONG TERM GOAL #5   Title Patient will verbalize good understanding of lymphedema risk reduction practices after attending the ABC class.    Time 4    Period Weeks    Status Achieved                 Plan - 05/13/20 1522    Clinical Impression Statement Pt returns 6 weeks after  last SOZO screen which was red flagged with lymphedema onset.  Pt has not worn the sleeve more than 4 times in 6 weeks but her SOZO has decreased from 21.4 to 16.7 which is better but still 9 higher than baseline.  Pt was encouraged to wear the sleeve espeically when active and using the UE.  Explained the importance of compression which pt seemed to understand better post session.  pt will try to wear the sleeve again and was given prescription to obtain a  different sleeve so she has a comfortable one.  Pt is beginning infusion today to last around 8 weeks and then starting radiation so screen was scheduled for 3 months with pt aware to return if it gets any worse during treatment.           Patient will benefit from skilled therapeutic intervention in order to improve the following deficits and impairments:     Visit Diagnosis: Aftercare following surgery for neoplasm     Problem List Patient Active Problem List   Diagnosis Date Noted  . Chemotherapy induced neutropenia ([place]) 03/31/2020  . Port-A-Cath in place 02/04/2020  . Family history of breast cancer   . Family history of prostate cancer   . Family history of bone cancer   . Malignant neoplasm of upper-outer quadrant of left breast in female, estrogen receptor negative ([place]) 10/31/2019    Stark [name] 05/13/2020, 3:28 PM  [place], [place], [address] Phone: [phone]   Fax:  [phone]  Name: Lindsay [name] MRN: [id number] Date of Birth: 1955-08-30

## 2020-05-14 ENCOUNTER — Telehealth: Payer: Self-pay

## 2020-05-14 ENCOUNTER — Other Ambulatory Visit: Payer: Self-pay

## 2020-05-14 MED ORDER — AMOXICILLIN-POT CLAVULANATE 875-125 MG PO TABS: 1.0000 | ORAL_TABLET | Freq: Two times a day (BID) | ORAL | 0 refills | Status: DC

## 2020-05-14 NOTE — Telephone Encounter (Signed)
Lindsay [name] called stating she has the beginnings of bronchitis.  She as a mild scratchy cough for the past 2-3 days.  She denies, fever, chills, h/a.  She is fatigued but not any worse than usual.  She is requesting augmentin.  Reviewed with Dr Burr Medico. Lindsay Rooks is too call if she does not get better.

## 2020-05-15 ENCOUNTER — Ambulatory Visit: Payer: Medicare Other

## 2020-05-19 NOTE — Progress Notes (Signed)
[name]   [phone] Fax:([phone]) [phone]   Clinic Follow up Note   Patient Care Team: Scotty Court, DO as PCP - General Mauro Kaufmann, RN as Oncology Nurse Navigator Rockwell Germany, RN as Oncology Nurse Navigator [name], MD as Consulting Physician (General Surgery) Truitt Merle, MD as Consulting Physician (Hematology) Eppie Gibson, MD as Attending Physician (Radiation Oncology)  Date of Service:  05/20/2020  CHIEF COMPLAINT: F/uof left breastmetaplasticcancer, triple negative  SUMMARY OF ONCOLOGIC HISTORY: Oncology History Overview Note  Cancer Staging Malignant neoplasm of upper-outer quadrant of left breast in female, estrogen receptor negative (Lindsay [name]) Staging form: Breast, AJCC 8th Edition - Clinical stage from 10/28/2019: Stage IIIB (cT2, cN1, cM0, G3, ER-, PR-, HER2-) - Signed by Eppie Gibson, MD on 11/05/2019 - Pathologic stage from 12/26/2019: Stage IIIC (pT3, pN3a, cM0, G3, ER-, PR-, HER2-) - Signed by Truitt Merle, MD on 01/28/2020    Malignant neoplasm of upper-outer quadrant of left breast in female, estrogen receptor negative (Lindsay [name])  10/27/2019 Mammogram   IMPRESSION: 1. Right breast calcifications spanning 2.6 cm in the upper slightly medial breast are indeterminate.   2. Left breast dominant mass at 3 o'clock, 6 cm from nipple measuring 3x3.9x2.9 cm is highly suspicious. There is overlying skin thickening, skin involvement is not excluded.   3. Left breast mass/distorted tissue at 1 o'clock, 4cm from nipple measuring 3.0x1.2x2.9 cm is likely in contiguity with the dominant mass at 3 o'clock and is also highly suspicious. With the dominant lesion the overall span a disease is approximately 6 cm.   4. Left breast distortion at 2 o'clock 10 cm from the nipple is Suspicious, measuring 1.0 x 0.8 cm.   5.  Abnormal left axillary lymph nodes (2). Cortical thickness measures up to 0.6 cm.    10/28/2019 Initial Biopsy    Diagnosis 1. Breast, left, needle core biopsy, 3 o'clock, 5cmfn - INVASIVE MAMMARY CARCINOMA. 2. Breast, left, needle core biopsy, 2 o'clock, 10cmfn - INVASIVE MAMMARY CARCINOMA. 3. 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 Microscopic Comment 1. -3. Overall, immunohistochemistry favors a pronounced desmoplastic response, but metaplastic carcinoma cannot be excluded (Epithelioid component: CKAE1AE3 strong +, CK5/6 weak +). The greatest linear extent of tumor in any one core in specimen 1 is 14 mm. The greatest linear extent of tumor in any one core in specimen 2 is 16 mm.   10/28/2019 Receptors her2   3. PROGNOSTIC INDICATORS Results: IMMUNOHISTOCHEMICAL AND MORPHOMETRIC ANALYSIS PERFORMED MANUALLY The tumor cells are EQUIVOCAL for Her2 (2+). Her2 by FISH will be performed and results reported separately. Estrogen Receptor: 0%, NEGATIVE Progesterone Receptor: 0%, NEGATIVE Proliferation Marker Ki67: 10%    3. FLUORESCENCE IN-SITU HYBRIDIZATION Results: GROUP 5: HER2 **NEGATIVE** Equivocal form of amplification of the HER2 gene was detected in the IHC 2+ tissue sample received from this individual. HER2 FISH was performed by a technologist and cell imaging and analysis on the [employer].   10/28/2019 Cancer Staging   Staging form: Breast, AJCC 8th Edition - Clinical stage from 10/28/2019: Stage IIIB (cT2, cN1, cM0, G3, ER-, PR-, HER2-) - Signed by Eppie Gibson, MD on 11/05/2019   10/31/2019 Initial Diagnosis   Malignant neoplasm of upper-outer quadrant of left breast in female, estrogen receptor negative (Lindsay [name])   10/31/2019 Pathology Results   Diagnosis Breast, right, needle core biopsy, UOQ, posterior - FOCAL USUAL DUCTAL HYPERPLASIA AND FIBROCYSTIC CHANGES WITH CALCIFICATIONS - FIBROADENOMATOID CHANGES - NO MALIGNANCY  IDENTIFIED Microscopic Comment These results were called to [facility] on November 03, 2019.    11/05/2019 Genetic Testing   She declined Genetic testing    11/21/2019 Imaging   CT CAP w contrast  IMPRESSION: 1. Left breast mass and prominent left axillary lymph nodes, containing biopsy marking clips, consistent with newly diagnosed breast malignancy. 2. No definite evidence of distant metastatic disease in the chest, abdomen, or pelvis. 3. There are occasional small pulmonary nodules, measuring 2-3 mm, most likely incidental sequelae of prior infection or inflammation although metastatic disease is not excluded. Attention on follow-up. 4. Hepatic steatosis. 5. Aortic Atherosclerosis (ICD10-I70.0).     11/21/2019 Imaging   Bone Scan Whole Body IMPRESSION: 1. Single focus of uptake associated with a subacute fracture of LEFT anterior fourth rib. 2. No additional areas of abnormal uptake.   12/26/2019 Cancer Staging   Staging form: Breast, AJCC 8th Edition - Pathologic stage from 12/26/2019: Stage IIIC (pT3, pN3a, cM0, G3, ER-, PR-, HER2-) - Signed by Truitt Merle, MD on 01/28/2020   12/26/2019 Surgery   LEFT MASTECTOMY MODIFIED RADICAL and PAC Placement by Dr Marlou Starks   12/26/2019 Pathology Results   FINAL MICROSCOPIC DIAGNOSIS:   A. 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  - Ductal carcinoma in situ, high nuclear grade with central necrosis.  - Margins of resection:  - Metaplastic carcinoma focally involves the anterior margin and is < 1  mm from the posterior margin.  - DCIS is < 1 mm from the anterior margin.  - Metastatic carcinoma in (13) of (16) lymph nodes with extranodal  extension.  - Biopsy clip sites in breast and one lymph node.  - See oncology table.    ADDENDUM:  PROGNOSTIC INDICATOR RESULTS:  Immunohistochemical and morphometric analysis performed manually  The tumor cells are EQUIVOCAL for Her2 (2+). Her2 FISH has been ordered  and will be reported in an  addendum.  Estrogen Receptor:       NEGATIVE  Progesterone Receptor:   NEGATIVE  Proliferation Marker Ki-67:   30%    ADDENDUM:  FLOURESCENCE IN-SITU HYBRIDIZATION RESULTS:  GROUP 5:   HER2 NEGATIVE   01/28/2020 Echocardiogram   Baseline Echo  IMPRESSIONS     1. Left ventricular ejection fraction, by estimation, is 60 to 65%. The  left ventricle has normal function. The left ventricle has no regional  wall motion abnormalities. Left ventricular diastolic parameters are  consistent with Grade I diastolic  dysfunction (impaired relaxation). The average left ventricular global  longitudinal strain is -19.8 %.   2. Right ventricular systolic function is normal. The right ventricular  size is normal. Tricuspid regurgitation signal is inadequate for assessing  PA pressure.   3. The mitral valve is normal in structure. No evidence of mitral valve  regurgitation. No evidence of mitral stenosis.   4. The aortic valve is normal in structure. Aortic valve regurgitation is  not visualized. No aortic stenosis is present.   5. The inferior vena cava is normal in size with greater than 50%  respiratory variability, suggesting right atrial pressure of 3 mmHg.    02/05/2020 -  Chemotherapy   Keytruda q3weeks (to be taken for 1 whole year) with weekly Carboplatin/Taxol for 12 weeks starting 02/05/20-05/13/20 followed by Adriamycin/Cytoxan q2weeks X4 starting 05/20/20      CURRENT THERAPY:  Keytrudaq3weeks(to be taken for 1 whole year)with weeklyCarboplatin/Taxolfor 12 weeksstarting 02/05/20-1/6/22followed  by Adriamycin/Cytoxanq2weeksX4 starting 05/20/20  INTERVAL HISTORY:  Gaylon Melchor is here for a follow up and treatment. She presents to the clinic alone. She tolerated last cycle carboplatin and Taxol overall well, with moderate fatigue, no other side effects.  She noticed mild nasal congestion last week, no fever or chills, we called in Augmentin for her, and her symptoms resolved.   Her neuropathy is overall stable, her feet does hurt when she stands for long time.  No balance issue, her hand function is also normal.  All other systems were reviewed with the patient and are negative.  MEDICAL HISTORY:  Past Medical History:  Diagnosis Date  . Bronchitis   . Family history of bone cancer   . Family history of breast cancer   . Family history of prostate cancer   . Fibromyalgia   . HCV (hepatitis C virus)   . MRSA (methicillin resistant Staphylococcus aureus) 2012    SURGICAL HISTORY: Past Surgical History:  Procedure Laterality Date  . MASTECTOMY MODIFIED RADICAL Left 12/26/2019   Procedure: LEFT MASTECTOMY MODIFIED RADICAL;  Surgeon: [name], MD;  Location: [facility];  Service: General;  Laterality: Left;  PEC BLOCK  . PORTACATH PLACEMENT Right 12/26/2019   Procedure: INSERTION PORT-A-CATH WITH ULTRASOUND GUIDANCE;  Surgeon: [name], MD;  Location: [facility];  Service: General;  Laterality: Right;    I have reviewed the social history and family history with the patient and they are unchanged from previous note.  ALLERGIES:  is allergic to codeine.  MEDICATIONS:  Current Outpatient Medications  Medication Sig Dispense Refill  . acetaminophen (TYLENOL) 650 MG CR tablet Take 650 mg by mouth every 8 (eight) hours as needed for pain.     Marland Kitchen amoxicillin-clavulanate (AUGMENTIN) 875-125 MG tablet Take 1 tablet by mouth 2 (two) times daily. 14 tablet 0  . ascorbic acid (VITAMIN C) 250 MG CHEW Chew by mouth.    . dexamethasone (DECADRON) 4 MG tablet Take 2 tablets (8 mg total) by mouth daily. Start the day after carboplatin chemotherapy for 3 days. 30 tablet 1  . diphenoxylate-atropine (LOMOTIL) 2.5-0.025 MG tablet Take 1-2 tablets by mouth 4 (four) times daily as needed for diarrhea or loose stools. 30 tablet 1  . gabapentin (NEURONTIN) 300 MG capsule Take 300 mg by mouth 3 (three) times daily as needed.    . lidocaine-prilocaine (EMLA) cream Apply 1  application topically as needed. 30 g 2  . methocarbamol (ROBAXIN) 750 MG tablet Take 1 tablet (750 mg total) by mouth 4 (four) times daily as needed (use for muscle cramps/pain). 30 tablet 2  . omeprazole (PRILOSEC) 20 MG capsule Take 1 capsule (20 mg total) by mouth daily. 30 capsule 2  . ondansetron (ZOFRAN) 8 MG tablet Take 1 tablet (8 mg total) by mouth 2 (two) times daily as needed for refractory nausea / vomiting. Start on day 3 after carboplatin chemo. 30 tablet 1  . prochlorperazine (COMPAZINE) 10 MG tablet Take 1 tablet (10 mg total) by mouth every 6 (six) hours as needed (Nausea or vomiting). 30 tablet 1   No current facility-administered medications for this visit.    PHYSICAL EXAMINATION: ECOG PERFORMANCE STATUS: 1 - Symptomatic but completely ambulatory  Vitals:   05/20/20 1147  BP: 139/78  Pulse: 70  Resp: 15  Temp: (!) 97.1 F (36.2 C)  SpO2: 100%   Filed Weights   05/20/20 1147  Weight: 186 lb 9.6 oz (84.6 kg)    GENERAL:alert, no distress  and comfortable SKIN: skin color, texture, turgor are normal, no rashes or significant lesions EYES: normal, Conjunctiva are pink and non-injected, sclera clear Musculoskeletal:no cyanosis of digits and no clubbing  NEURO: alert & oriented x 3 with fluent speech, no focal motor/sensory deficits  LABORATORY DATA:  I have reviewed the data as listed CBC Latest Ref Rng & Units 05/20/2020 05/13/2020 05/05/2020  WBC 4.0 - 10.5 K/uL 3.3(L) 4.6 6.2  Hemoglobin 12.0 - 15.0 g/dL 10.5(L) 10.9(L) 12.3  Hematocrit 36.0 - 46.0 % 30.3(L) 31.4(L) 36.4  Platelets 150 - 400 K/uL 161 176 205     CMP Latest Ref Rng & Units 05/13/2020 05/05/2020 04/22/2020  Glucose 70 - 99 mg/dL 108(H) 110(H) 106(H)  BUN 8 - 23 mg/dL '12 10 8  ' Creatinine 0.44 - 1.00 mg/dL 0.71 0.68 0.67  Sodium 135 - 145 mmol/L 137 139 141  Potassium 3.5 - 5.1 mmol/L 3.6 4.1 3.8  Chloride 98 - 111 mmol/L 105 106 108  CO2 22 - 32 mmol/L '24 27 24  ' Calcium 8.9 - 10.3 mg/dL  8.8(L) 9.1 9.0  Total Protein 6.5 - 8.1 g/dL 6.2(L) 6.4(L) 6.2(L)  Total Bilirubin 0.3 - 1.2 mg/dL 0.6 0.5 0.4  Alkaline Phos 38 - 126 U/L 74 72 69  AST 15 - 41 U/L 38 54(H) 37  ALT 0 - 44 U/L '16 19 14      ' RADIOGRAPHIC STUDIES: I have personally reviewed the radiological images as listed and agreed with the findings in the report. No results found.   ASSESSMENT & PLAN:  Lindsay [name] is a 65 y.o. female with   1.Left breast Multifocal Metaplastic cancer,pT3N3aM0,including one unresectable axillary node, metaplastic carcinoma,ER-/PR-/HER2-, grade IIIC -She was diagnosed in 10/2019 with left breastmetaplasticcancer, triple negative disease metastatic to left axillary LN. Her 11/2019 CT CAP and bone scan was negative for distant metastasis.  -She underwent left mastectomy with Dr Marlou Starks on 12/26/19.Surgical pathshowed6cm multifocal metaplastic carcinomaand13/16 positive LN but unfortunately 1 positive LN was not able to be removed given it invasion ofa vessel. -She startedadjuvant chemo with Keytrudaq3weeks(to be taken for 1 whole year)withweekly Carboplatin/Taxolfor 12 weeksbeginning9/30/21-1/6/22followed by Adriamycin/Cytoxanq2weeks X4 starting 05/20/20.  -she has completed weekly carboplatin and Taxol, overall tolerated moderately well. -Lab reviewed, mild neutropenia, adequate for treatment, cycle 1 Adriamycin and Cytoxan today, with G-CSF on day 3  2. Hepatitis C,untreated,diagnosed in [place] her to ID or [employer] for treatment after she completed chemo. -She also notes a history of MRSA which she is not sure has completed cleared. -We will watch her liver functions closely during the chemotherapy.We discussed her that she may have hepatitis C flare when she has chemo  3. Social and financial support  -She has no children and no close relatives. She does have close friends who are supportive.  -She did not have medical insurance for  many years. She recently applied for blue cross and blue shield which is not effective yet.  4. Mild anemia -Secondary to chemotherapy, mild, will continue monitoring. -Stable  PLAN: -lab reviewed, adequate for treatment, will proceed for cycle AC today, with G-CSF on day 3 -She will return next week for Keytruda -Lab, flush, follow-up and cycle 2 AC in 2 weeks -Plan to repeat ultrasound of left axilla (she has 1 unresectable metastatic node) in 1-2 weeks    No problem-specific Assessment & Plan notes found for this encounter.   Orders Placed This Encounter  Procedures  . US BREAST LTD UNI LEFT INC AXILLA  Standing Status:   Future    Standing Expiration Date:   05/20/2021    Order Specific Question:   Reason for Exam (SYMPTOM  OR DIAGNOSIS REQUIRED)    Answer:   follow up resectable metastatic left axillary node    Order Specific Question:   Preferred imaging location?    Answer:   [facility]   All questions were answered. The patient knows to call the clinic with any problems, questions or concerns. No barriers to learning was detected. The total time spent in the appointment was 30 minutes.     Truitt Merle, MD 05/20/2020   I, Joslyn Devon, am acting as scribe for Truitt Merle, MD.   I have reviewed the above documentation for accuracy and completeness, and I agree with the above.

## 2020-05-20 ENCOUNTER — Inpatient Hospital Stay: Payer: Medicare Other

## 2020-05-20 ENCOUNTER — Ambulatory Visit: Payer: Medicare Other | Admitting: Hematology

## 2020-05-20 ENCOUNTER — Inpatient Hospital Stay (HOSPITAL_BASED_OUTPATIENT_CLINIC_OR_DEPARTMENT_OTHER): Payer: Medicare Other | Admitting: Hematology

## 2020-05-20 ENCOUNTER — Encounter: Payer: Self-pay | Admitting: Hematology

## 2020-05-20 ENCOUNTER — Ambulatory Visit: Payer: Medicare Other

## 2020-05-20 ENCOUNTER — Other Ambulatory Visit: Payer: Medicare Other

## 2020-05-20 ENCOUNTER — Other Ambulatory Visit: Payer: Self-pay

## 2020-05-20 ENCOUNTER — Encounter: Payer: Self-pay | Admitting: *Deleted

## 2020-05-20 VITALS — BP 139/78 | HR 70 | Temp 97.1°F | Resp 15 | Ht 61.0 in | Wt 186.6 lb

## 2020-05-20 DIAGNOSIS — Z171 Estrogen receptor negative status [ER-]: Secondary | ICD-10-CM

## 2020-05-20 DIAGNOSIS — Z5112 Encounter for antineoplastic immunotherapy: Secondary | ICD-10-CM | POA: Diagnosis not present

## 2020-05-20 DIAGNOSIS — C50412 Malignant neoplasm of upper-outer quadrant of left female breast: Secondary | ICD-10-CM

## 2020-05-20 LAB — CBC WITH DIFFERENTIAL (CANCER CENTER ONLY)
Abs Immature Granulocytes: 0.01 10*3/uL (ref 0.00–0.07)
Basophils Absolute: 0 10*3/uL (ref 0.0–0.1)
Basophils Relative: 1 %
Eosinophils Absolute: 0.2 10*3/uL (ref 0.0–0.5)
Eosinophils Relative: 6 %
HCT: 30.3 % — ABNORMAL LOW (ref 36.0–46.0)
Hemoglobin: 10.5 g/dL — ABNORMAL LOW (ref 12.0–15.0)
Immature Granulocytes: 0 %
Lymphocytes Relative: 44 %
Lymphs Abs: 1.5 10*3/uL (ref 0.7–4.0)
MCH: 34.5 pg — ABNORMAL HIGH (ref 26.0–34.0)
MCHC: 34.7 g/dL (ref 30.0–36.0)
MCV: 99.7 fL (ref 80.0–100.0)
Monocytes Absolute: 0.3 10*3/uL (ref 0.1–1.0)
Monocytes Relative: 9 %
Neutro Abs: 1.3 10*3/uL — ABNORMAL LOW (ref 1.7–7.7)
Neutrophils Relative %: 40 %
Platelet Count: 161 10*3/uL (ref 150–400)
RBC: 3.04 MIL/uL — ABNORMAL LOW (ref 3.87–5.11)
RDW: 13.7 % (ref 11.5–15.5)
WBC Count: 3.3 10*3/uL — ABNORMAL LOW (ref 4.0–10.5)
nRBC: 0 % (ref 0.0–0.2)

## 2020-05-20 LAB — CMP (CANCER CENTER ONLY)
ALT: 17 U/L (ref 0–44)
AST: 43 U/L — ABNORMAL HIGH (ref 15–41)
Albumin: 3.5 g/dL (ref 3.5–5.0)
Alkaline Phosphatase: 66 U/L (ref 38–126)
Anion gap: 6 (ref 5–15)
BUN: 11 mg/dL (ref 8–23)
CO2: 25 mmol/L (ref 22–32)
Calcium: 8.8 mg/dL — ABNORMAL LOW (ref 8.9–10.3)
Chloride: 109 mmol/L (ref 98–111)
Creatinine: 0.68 mg/dL (ref 0.44–1.00)
GFR, Estimated: >60 mL/min (ref >60)
Glucose, Bld: 103 mg/dL — ABNORMAL HIGH (ref 70–99)
Potassium: 3.5 mmol/L (ref 3.5–5.1)
Sodium: 140 mmol/L (ref 135–145)
Total Bilirubin: 0.6 mg/dL (ref 0.3–1.2)
Total Protein: 5.9 g/dL — ABNORMAL LOW (ref 6.5–8.1)

## 2020-05-20 MED ORDER — METHYLPREDNISOLONE SODIUM SUCC 40 MG IJ SOLR
10.0000 mg | Freq: Once | INTRAMUSCULAR | Status: AC
  Administered 2020-05-20: 10 mg via INTRAVENOUS

## 2020-05-20 MED ORDER — SODIUM CHLORIDE 0.9 % IV SOLN
Freq: Once | INTRAVENOUS | Status: AC
  Filled 2020-05-20: qty 250

## 2020-05-20 MED ORDER — SODIUM CHLORIDE 0.9 % IV SOLN
600.0000 mg/m2 | Freq: Once | INTRAVENOUS | Status: AC
  Administered 2020-05-20: 1160 mg via INTRAVENOUS
  Filled 2020-05-20: qty 58

## 2020-05-20 MED ORDER — PALONOSETRON HCL INJECTION 0.25 MG/5ML
INTRAVENOUS | Status: AC
  Filled 2020-05-20: qty 5

## 2020-05-20 MED ORDER — HEPARIN SOD (PORK) LOCK FLUSH 100 UNIT/ML IV SOLN
500.0000 [IU] | Freq: Once | INTRAVENOUS | Status: AC | PRN
  Administered 2020-05-20: 500 [IU]
  Filled 2020-05-20: qty 5

## 2020-05-20 MED ORDER — DOXORUBICIN HCL CHEMO IV INJECTION 2 MG/ML
60.0000 mg/m2 | Freq: Once | INTRAVENOUS | Status: AC
  Administered 2020-05-20: 116 mg via INTRAVENOUS
  Filled 2020-05-20: qty 58

## 2020-05-20 MED ORDER — PALONOSETRON HCL INJECTION 0.25 MG/5ML
0.2500 mg | Freq: Once | INTRAVENOUS | Status: AC
Start: 2020-05-20 — End: 2020-05-20
  Administered 2020-05-20: 0.25 mg via INTRAVENOUS

## 2020-05-20 MED ORDER — SODIUM CHLORIDE 0.9% FLUSH
10.0000 mL | INTRAVENOUS | Status: DC | PRN
  Administered 2020-05-20: 10 mL
  Filled 2020-05-20: qty 10

## 2020-05-20 MED ORDER — METHYLPREDNISOLONE SODIUM SUCC 40 MG IJ SOLR
INTRAMUSCULAR | Status: AC
  Filled 2020-05-20: qty 1

## 2020-05-20 MED ORDER — SODIUM CHLORIDE 0.9 % IV SOLN
150.0000 mg | Freq: Once | INTRAVENOUS | Status: AC
  Administered 2020-05-20: 150 mg via INTRAVENOUS
  Filled 2020-05-20: qty 150

## 2020-05-20 NOTE — Patient Instructions (Signed)
[place] Discharge Instructions for Patients Receiving Chemotherapy  Today you received the following chemotherapy agents: Doxorubicin, cyclophosphamide   To help prevent nausea and vomiting after your treatment, we encourage you to take your nausea medication as needed.   If you develop nausea and vomiting that is not controlled by your nausea medication, call the clinic.   BELOW ARE SYMPTOMS THAT SHOULD BE REPORTED IMMEDIATELY:  *FEVER GREATER THAN 100.5 F  *CHILLS WITH OR WITHOUT FEVER  NAUSEA AND VOMITING THAT IS NOT CONTROLLED WITH YOUR NAUSEA MEDICATION  *UNUSUAL SHORTNESS OF BREATH  *UNUSUAL BRUISING OR BLEEDING  TENDERNESS IN MOUTH AND THROAT WITH OR WITHOUT PRESENCE OF ULCERS  *URINARY PROBLEMS  *BOWEL PROBLEMS  UNUSUAL RASH Items with * indicate a potential emergency and should be followed up as soon as possible.  Feel free to call the clinic should you have any questions or concerns. The clinic phone number is [phone].  Please show the [place] at check-in to the Emergency Department and triage nurse.  Doxorubicin injection What is this medicine? DOXORUBICIN (dox oh ROO bi sin) is a chemotherapy drug. It is used to treat many kinds of cancer like leukemia, lymphoma, neuroblastoma, sarcoma, and Wilms' tumor. It is also used to treat bladder cancer, breast cancer, lung cancer, ovarian cancer, stomach cancer, and thyroid cancer. This medicine may be used for other purposes; ask your health care provider or pharmacist if you have questions. COMMON BRAND NAME(S): Adriamycin, Adriamycin PFS, Adriamycin RDF, Rubex What should I tell my health care provider before I take this medicine? They need to know if you have any of these conditions:  heart disease  history of low blood counts caused by a medicine  liver disease  recent or ongoing radiation therapy  an unusual or allergic reaction to doxorubicin, other chemotherapy agents,  other medicines, foods, dyes, or preservatives  pregnant or trying to get pregnant  breast-feeding How should I use this medicine? This drug is given as an infusion into a vein. It is administered in a hospital or clinic by a specially trained health care professional. If you have pain, swelling, burning or any unusual feeling around the site of your injection, tell your health care professional right away. Talk to your pediatrician regarding the use of this medicine in children. Special care may be needed. Overdosage: If you think you have taken too much of this medicine contact a poison control center or emergency room at once. NOTE: This medicine is only for you. Do not share this medicine with others. What if I miss a dose? It is important not to miss your dose. Call your doctor or health care professional if you are unable to keep an appointment. What may interact with this medicine? This medicine may interact with the following medications:  6-mercaptopurine  paclitaxel  phenytoin  St. John's Wort  trastuzumab  verapamil This list may not describe all possible interactions. Give your health care provider a list of all the medicines, herbs, non-prescription drugs, or dietary supplements you use. Also tell them if you smoke, drink alcohol, or use illegal drugs. Some items may interact with your medicine. What should I watch for while using this medicine? This drug may make you feel generally unwell. This is not uncommon, as chemotherapy can affect healthy cells as well as cancer cells. Report any side effects. Continue your course of treatment even though you feel ill unless your doctor tells you to stop. There is a maximum amount  of this medicine you should receive throughout your life. The amount depends on the medical condition being treated and your overall health. Your doctor will watch how much of this medicine you receive in your lifetime. Tell your doctor if you have taken  this medicine before. You may need blood work done while you are taking this medicine. Your urine may turn red for a few days after your dose. This is not blood. If your urine is dark or brown, call your doctor. In some cases, you may be given additional medicines to help with side effects. Follow all directions for their use. Call your doctor or health care professional for advice if you get a fever, chills or sore throat, or other symptoms of a cold or flu. Do not treat yourself. This drug decreases your body's ability to fight infections. Try to avoid being around people who are sick. This medicine may increase your risk to bruise or bleed. Call your doctor or health care professional if you notice any unusual bleeding. Talk to your doctor about your risk of cancer. You may be more at risk for certain types of cancers if you take this medicine. Do not become pregnant while taking this medicine or for 6 months after stopping it. Women should inform their doctor if they wish to become pregnant or think they might be pregnant. Men should not father a child while taking this medicine and for 6 months after stopping it. There is a potential for serious side effects to an unborn child. Talk to your health care professional or pharmacist for more information. Do not breast-feed an infant while taking this medicine. This medicine has caused ovarian failure in some women and reduced sperm counts in some men This medicine may interfere with the ability to have a child. Talk with your doctor or health care professional if you are concerned about your fertility. This medicine may cause a decrease in Co-Enzyme Q-10. You should make sure that you get enough Co-Enzyme Q-10 while you are taking this medicine. Discuss the foods you eat and the vitamins you take with your health care professional. What side effects may I notice from receiving this medicine? Side effects that you should report to your doctor or health  care professional as soon as possible:  allergic reactions like skin rash, itching or hives, swelling of the face, lips, or tongue  breathing problems  chest pain  fast or irregular heartbeat  low blood counts - this medicine may decrease the number of white blood cells, red blood cells and platelets. You may be at increased risk for infections and bleeding.  pain, redness, or irritation at site where injected  signs of infection - fever or chills, cough, sore throat, pain or difficulty passing urine  signs of decreased platelets or bleeding - bruising, pinpoint red spots on the skin, black, tarry stools, blood in the urine  swelling of the ankles, feet, hands  tiredness  weakness Side effects that usually do not require medical attention (report to your doctor or health care professional if they continue or are bothersome):  diarrhea  hair loss  mouth sores  nail discoloration or damage  nausea  red colored urine  vomiting This list may not describe all possible side effects. Call your doctor for medical advice about side effects. You may report side effects to FDA at 1-800-FDA-1088. Where should I keep my medicine? This drug is given in a hospital or clinic and will not be stored at home.  NOTE: This sheet is a summary. It may not cover all possible information. If you have questions about this medicine, talk to your doctor, pharmacist, or health care provider.  2021 Elsevier/Gold Standard (2016-12-06 11:01:26)  Cyclophosphamide Injection What is this medicine? CYCLOPHOSPHAMIDE (sye kloe FOSS fa mide) is a chemotherapy drug. It slows the growth of cancer cells. This medicine is used to treat many types of cancer like lymphoma, myeloma, leukemia, breast cancer, and ovarian cancer, to name a few. This medicine may be used for other purposes; ask your health care provider or pharmacist if you have questions. COMMON BRAND NAME(S): Cytoxan, Neosar What should I tell my  health care provider before I take this medicine? They need to know if you have any of these conditions:  heart disease  history of irregular heartbeat  infection  kidney disease  liver disease  low blood counts, like white cells, platelets, or red blood cells  on hemodialysis  recent or ongoing radiation therapy  scarring or thickening of the lungs  trouble passing urine  an unusual or allergic reaction to cyclophosphamide, other medicines, foods, dyes, or preservatives  pregnant or trying to get pregnant  breast-feeding How should I use this medicine? This drug is usually given as an injection into a vein or muscle or by infusion into a vein. It is administered in a hospital or clinic by a specially trained health care professional. Talk to your pediatrician regarding the use of this medicine in children. Special care may be needed. Overdosage: If you think you have taken too much of this medicine contact a poison control center or emergency room at once. NOTE: This medicine is only for you. Do not share this medicine with others. What if I miss a dose? It is important not to miss your dose. Call your doctor or health care professional if you are unable to keep an appointment. What may interact with this medicine?  amphotericin B  azathioprine  certain antivirals for HIV or hepatitis  certain medicines for blood pressure, heart disease, irregular heart beat  certain medicines that treat or prevent blood clots like warfarin  certain other medicines for cancer  cyclosporine  etanercept  indomethacin  medicines that relax muscles for surgery  medicines to increase blood counts  metronidazole This list may not describe all possible interactions. Give your health care provider a list of all the medicines, herbs, non-prescription drugs, or dietary supplements you use. Also tell them if you smoke, drink alcohol, or use illegal drugs. Some items may interact with  your medicine. What should I watch for while using this medicine? Your condition will be monitored carefully while you are receiving this medicine. You may need blood work done while you are taking this medicine. Drink water or other fluids as directed. Urinate often, even at night. Some products may contain alcohol. Ask your health care professional if this medicine contains alcohol. Be sure to tell all health care professionals you are taking this medicine. Certain medicines, like metronidazole and disulfiram, can cause an unpleasant reaction when taken with alcohol. The reaction includes flushing, headache, nausea, vomiting, sweating, and increased thirst. The reaction can last from 30 minutes to several hours. Do not become pregnant while taking this medicine or for 1 year after stopping it. Women should inform their health care professional if they wish to become pregnant or think they might be pregnant. Men should not father a child while taking this medicine and for 4 months after stopping it. There is  potential for serious side effects to an unborn child. Talk to your health care professional for more information. Do not breast-feed an infant while taking this medicine or for 1 week after stopping it. This medicine has caused ovarian failure in some women. This medicine may make it more difficult to get pregnant. Talk to your health care professional if you are concerned about your fertility. This medicine has caused decreased sperm counts in some men. This may make it more difficult to father a child. Talk to your health care professional if you are concerned about your fertility. Call your health care professional for advice if you get a fever, chills, or sore throat, or other symptoms of a cold or flu. Do not treat yourself. This medicine decreases your body's ability to fight infections. Try to avoid being around people who are sick. Avoid taking medicines that contain aspirin, acetaminophen,  ibuprofen, naproxen, or ketoprofen unless instructed by your health care professional. These medicines may hide a fever. Talk to your health care professional about your risk of cancer. You may be more at risk for certain types of cancer if you take this medicine. If you are going to need surgery or other procedure, tell your health care professional that you are using this medicine. Be careful brushing or flossing your teeth or using a toothpick because you may get an infection or bleed more easily. If you have any dental work done, tell your dentist you are receiving this medicine. What side effects may I notice from receiving this medicine? Side effects that you should report to your doctor or health care professional as soon as possible:  allergic reactions like skin rash, itching or hives, swelling of the face, lips, or tongue  breathing problems  nausea, vomiting  signs and symptoms of bleeding such as bloody or black, tarry stools; red or dark brown urine; spitting up blood or brown material that looks like coffee grounds; red spots on the skin; unusual bruising or bleeding from the eyes, gums, or nose  signs and symptoms of heart failure like fast, irregular heartbeat, sudden weight gain; swelling of the ankles, feet, hands  signs and symptoms of infection like fever; chills; cough; sore throat; pain or trouble passing urine  signs and symptoms of kidney injury like trouble passing urine or change in the amount of urine  signs and symptoms of liver injury like dark yellow or brown urine; general ill feeling or flu-like symptoms; light-colored stools; loss of appetite; nausea; right upper belly pain; unusually weak or tired; yellowing of the eyes or skin Side effects that usually do not require medical attention (report to your doctor or health care professional if they continue or are bothersome):  confusion  decreased hearing  diarrhea  facial flushing  hair  loss  headache  loss of appetite  missed menstrual periods  signs and symptoms of low red blood cells or anemia such as unusually weak or tired; feeling faint or lightheaded; falls  skin discoloration This list may not describe all possible side effects. Call your doctor for medical advice about side effects. You may report side effects to FDA at 1-800-FDA-1088. Where should I keep my medicine? This drug is given in a hospital or clinic and will not be stored at home. NOTE: This sheet is a summary. It may not cover all possible information. If you have questions about this medicine, talk to your doctor, pharmacist, or health care provider.  2021 Elsevier/Gold Standard (2019-01-27 09:53:29)

## 2020-05-20 NOTE — Progress Notes (Signed)
Ok to treat per Dr. Burr Medico.  ANC = 1.3. Pt will receive Fulphila on 05/22/20.  Kennith Center, Pharm.D., CPP 05/20/2020@12 :50 PM

## 2020-05-22 ENCOUNTER — Ambulatory Visit: Payer: Medicare Other

## 2020-05-22 ENCOUNTER — Inpatient Hospital Stay: Payer: Medicare Other

## 2020-05-22 ENCOUNTER — Other Ambulatory Visit: Payer: Self-pay

## 2020-05-22 VITALS — BP 131/61 | HR 72 | Temp 96.9°F | Resp 18

## 2020-05-22 DIAGNOSIS — C50412 Malignant neoplasm of upper-outer quadrant of left female breast: Secondary | ICD-10-CM

## 2020-05-22 DIAGNOSIS — Z5112 Encounter for antineoplastic immunotherapy: Secondary | ICD-10-CM | POA: Diagnosis not present

## 2020-05-22 MED ORDER — PEGFILGRASTIM-JMDB 6 MG/0.6ML ~~LOC~~ SOSY
6.0000 mg | PREFILLED_SYRINGE | Freq: Once | SUBCUTANEOUS | Status: AC
  Administered 2020-05-22: 6 mg via SUBCUTANEOUS

## 2020-05-22 NOTE — Patient Instructions (Signed)
Pegfilgrastim injection What is this medicine? PEGFILGRASTIM (PEG fil gra stim) is a long-acting granulocyte colony-stimulating factor that stimulates the growth of neutrophils, a type of white blood cell important in the body's fight against infection. It is used to reduce the incidence of fever and infection in patients with certain types of cancer who are receiving chemotherapy that affects the bone marrow, and to increase survival after being exposed to high doses of radiation. This medicine may be used for other purposes; ask your health care provider or pharmacist if you have questions. COMMON BRAND NAME(S): Fulphila, Neulasta, Nyvepria, UDENYCA, Ziextenzo What should I tell my health care provider before I take this medicine? They need to know if you have any of these conditions:  kidney disease  latex allergy  ongoing radiation therapy  sickle cell disease  skin reactions to acrylic adhesives (On-Body Injector only)  an unusual or allergic reaction to pegfilgrastim, filgrastim, other medicines, foods, dyes, or preservatives  pregnant or trying to get pregnant  breast-feeding How should I use this medicine? This medicine is for injection under the skin. If you get this medicine at home, you will be taught how to prepare and give the pre-filled syringe or how to use the On-body Injector. Refer to the patient Instructions for Use for detailed instructions. Use exactly as directed. Tell your healthcare provider immediately if you suspect that the On-body Injector may not have performed as intended or if you suspect the use of the On-body Injector resulted in a missed or partial dose. It is important that you put your used needles and syringes in a special sharps container. Do not put them in a trash can. If you do not have a sharps container, call your pharmacist or healthcare provider to get one. Talk to your pediatrician regarding the use of this medicine in children. While this drug  may be prescribed for selected conditions, precautions do apply. Overdosage: If you think you have taken too much of this medicine contact a poison control center or emergency room at once. NOTE: This medicine is only for you. Do not share this medicine with others. What if I miss a dose? It is important not to miss your dose. Call your doctor or health care professional if you miss your dose. If you miss a dose due to an On-body Injector failure or leakage, a new dose should be administered as soon as possible using a single prefilled syringe for manual use. What may interact with this medicine? Interactions have not been studied. This list may not describe all possible interactions. Give your health care provider a list of all the medicines, herbs, non-prescription drugs, or dietary supplements you use. Also tell them if you smoke, drink alcohol, or use illegal drugs. Some items may interact with your medicine. What should I watch for while using this medicine? Your condition will be monitored carefully while you are receiving this medicine. You may need blood work done while you are taking this medicine. Talk to your health care provider about your risk of cancer. You may be more at risk for certain types of cancer if you take this medicine. If you are going to need a MRI, CT scan, or other procedure, tell your doctor that you are using this medicine (On-Body Injector only). What side effects may I notice from receiving this medicine? Side effects that you should report to your doctor or health care professional as soon as possible:  allergic reactions (skin rash, itching or hives, swelling of   the face, lips, or tongue)  back pain  dizziness  fever  pain, redness, or irritation at site where injected  pinpoint red spots on the skin  red or dark-brown urine  shortness of breath or breathing problems  stomach or side pain, or pain at the shoulder  swelling  tiredness  trouble  passing urine or change in the amount of urine  unusual bruising or bleeding Side effects that usually do not require medical attention (report to your doctor or health care professional if they continue or are bothersome):  bone pain  muscle pain This list may not describe all possible side effects. Call your doctor for medical advice about side effects. You may report side effects to FDA at 1-800-FDA-1088. Where should I keep my medicine? Keep out of the reach of children. If you are using this medicine at home, you will be instructed on how to store it. Throw away any unused medicine after the expiration date on the label. NOTE: This sheet is a summary. It may not cover all possible information. If you have questions about this medicine, talk to your doctor, pharmacist, or health care provider.  2021 Elsevier/Gold Standard (2019-05-16 13:20:51)  

## 2020-05-24 ENCOUNTER — Telehealth: Payer: Self-pay | Admitting: Hematology

## 2020-05-24 NOTE — Telephone Encounter (Signed)
No 1/13 los. No changes made to pt's schedule.  

## 2020-05-26 ENCOUNTER — Ambulatory Visit: Payer: Medicare Other

## 2020-05-26 ENCOUNTER — Other Ambulatory Visit: Payer: Medicare Other

## 2020-05-27 ENCOUNTER — Other Ambulatory Visit: Payer: Self-pay

## 2020-05-27 ENCOUNTER — Ambulatory Visit: Payer: Medicare Other | Admitting: Nurse Practitioner

## 2020-05-27 ENCOUNTER — Ambulatory Visit: Payer: Medicare Other

## 2020-05-27 ENCOUNTER — Other Ambulatory Visit: Payer: Medicare Other

## 2020-05-27 ENCOUNTER — Inpatient Hospital Stay: Payer: Medicare Other

## 2020-05-27 VITALS — BP 147/77 | HR 86 | Temp 98.4°F | Resp 18 | Ht 61.0 in | Wt 180.5 lb

## 2020-05-27 DIAGNOSIS — C50412 Malignant neoplasm of upper-outer quadrant of left female breast: Secondary | ICD-10-CM

## 2020-05-27 DIAGNOSIS — Z95828 Presence of other vascular implants and grafts: Secondary | ICD-10-CM

## 2020-05-27 DIAGNOSIS — Z171 Estrogen receptor negative status [ER-]: Secondary | ICD-10-CM

## 2020-05-27 DIAGNOSIS — Z5112 Encounter for antineoplastic immunotherapy: Secondary | ICD-10-CM | POA: Diagnosis not present

## 2020-05-27 LAB — CMP (CANCER CENTER ONLY)
ALT: 10 U/L (ref 0–44)
AST: 21 U/L (ref 15–41)
Albumin: 3.7 g/dL (ref 3.5–5.0)
Alkaline Phosphatase: 74 U/L (ref 38–126)
Anion gap: 7 (ref 5–15)
BUN: 8 mg/dL (ref 8–23)
CO2: 25 mmol/L (ref 22–32)
Calcium: 8.7 mg/dL — ABNORMAL LOW (ref 8.9–10.3)
Chloride: 104 mmol/L (ref 98–111)
Creatinine: 0.63 mg/dL (ref 0.44–1.00)
GFR, Estimated: >60 mL/min (ref >60)
Glucose, Bld: 103 mg/dL — ABNORMAL HIGH (ref 70–99)
Potassium: 3.8 mmol/L (ref 3.5–5.1)
Sodium: 136 mmol/L (ref 135–145)
Total Bilirubin: 0.5 mg/dL (ref 0.3–1.2)
Total Protein: 5.8 g/dL — ABNORMAL LOW (ref 6.5–8.1)

## 2020-05-27 LAB — CBC WITH DIFFERENTIAL (CANCER CENTER ONLY)
Abs Immature Granulocytes: 0.01 10*3/uL (ref 0.00–0.07)
Basophils Absolute: 0 10*3/uL (ref 0.0–0.1)
Basophils Relative: 2 %
Eosinophils Absolute: 0.1 10*3/uL (ref 0.0–0.5)
Eosinophils Relative: 7 %
HCT: 29.3 % — ABNORMAL LOW (ref 36.0–46.0)
Hemoglobin: 10.1 g/dL — ABNORMAL LOW (ref 12.0–15.0)
Immature Granulocytes: 1 %
Lymphocytes Relative: 61 %
Lymphs Abs: 1 10*3/uL (ref 0.7–4.0)
MCH: 34.5 pg — ABNORMAL HIGH (ref 26.0–34.0)
MCHC: 34.5 g/dL (ref 30.0–36.0)
MCV: 100 fL (ref 80.0–100.0)
Monocytes Absolute: 0.1 10*3/uL (ref 0.1–1.0)
Monocytes Relative: 6 %
Neutro Abs: 0.4 10*3/uL — CL (ref 1.7–7.7)
Neutrophils Relative %: 23 %
Platelet Count: 73 10*3/uL — ABNORMAL LOW (ref 150–400)
RBC: 2.93 MIL/uL — ABNORMAL LOW (ref 3.87–5.11)
RDW: 12.9 % (ref 11.5–15.5)
WBC Count: 1.6 10*3/uL — ABNORMAL LOW (ref 4.0–10.5)
nRBC: 0 % (ref 0.0–0.2)

## 2020-05-27 MED ORDER — SODIUM CHLORIDE 0.9 % IV SOLN
200.0000 mg | Freq: Once | INTRAVENOUS | Status: AC
  Administered 2020-05-27: 200 mg via INTRAVENOUS
  Filled 2020-05-27: qty 8

## 2020-05-27 MED ORDER — SODIUM CHLORIDE 0.9% FLUSH
10.0000 mL | Freq: Once | INTRAVENOUS | Status: AC
  Administered 2020-05-27: 10 mL
  Filled 2020-05-27: qty 10

## 2020-05-27 MED ORDER — HEPARIN SOD (PORK) LOCK FLUSH 100 UNIT/ML IV SOLN
500.0000 [IU] | Freq: Once | INTRAVENOUS | Status: AC | PRN
  Administered 2020-05-27: 500 [IU]
  Filled 2020-05-27: qty 5

## 2020-05-27 MED ORDER — SODIUM CHLORIDE 0.9 % IV SOLN
Freq: Once | INTRAVENOUS | Status: AC
  Filled 2020-05-27: qty 250

## 2020-05-27 MED ORDER — SODIUM CHLORIDE 0.9% FLUSH
10.0000 mL | INTRAVENOUS | Status: DC | PRN
  Administered 2020-05-27: 10 mL
  Filled 2020-05-27: qty 10

## 2020-05-27 NOTE — Progress Notes (Signed)
Per Dr. Burr Medico - okay to treat with abnormal labs.

## 2020-05-27 NOTE — Patient Instructions (Signed)
[facility] Discharge Instructions for Patients Receiving Chemotherapy  Today you received the following chemotherapy agents Pembrolizumab (Keytruda).  To help prevent nausea and vomiting after your treatment, we encourage you to take your nausea medication as prescribed.   If you develop nausea and vomiting that is not controlled by your nausea medication, call the clinic.   BELOW ARE SYMPTOMS THAT SHOULD BE REPORTED IMMEDIATELY:  *FEVER GREATER THAN 100.5 F  *CHILLS WITH OR WITHOUT FEVER  NAUSEA AND VOMITING THAT IS NOT CONTROLLED WITH YOUR NAUSEA MEDICATION  *UNUSUAL SHORTNESS OF BREATH  *UNUSUAL BRUISING OR BLEEDING  TENDERNESS IN MOUTH AND THROAT WITH OR WITHOUT PRESENCE OF ULCERS  *URINARY PROBLEMS  *BOWEL PROBLEMS  UNUSUAL RASH Items with * indicate a potential emergency and should be followed up as soon as possible.  Feel free to call the clinic should you have any questions or concerns. The clinic phone number is [phone].  Please show the CHEMO ALERT CARD at check-in to the Emergency Department and triage nurse.   

## 2020-05-29 ENCOUNTER — Ambulatory Visit: Payer: Medicare Other

## 2020-05-31 NOTE — Progress Notes (Signed)
[place]   [phone] [phone]   Clinic Follow up Note   Patient Care Team: Scotty Court, DO as PCP - General Mauro Kaufmann, RN as Oncology Nurse Navigator Rockwell Germany, RN as Oncology Nurse Navigator [name], MD as Consulting Physician (General Surgery) Truitt Merle, MD as Consulting Physician (Hematology) Eppie Gibson, MD as Attending Physician (Radiation Oncology)  Date of Service:  06/03/2020  CHIEF COMPLAINT:  F/uof left breastmetaplasticcancer, triple negative  SUMMARY OF ONCOLOGIC HISTORY: Oncology History Overview Note  Cancer Staging Malignant neoplasm of upper-outer quadrant of left breast in female, estrogen receptor negative ([place]) Staging form: Breast, AJCC 8th Edition - Clinical stage from 10/28/2019: Stage IIIB (cT2, cN1, cM0, G3, ER-, PR-, HER2-) - Signed by Eppie Gibson, MD on 11/05/2019 - Pathologic stage from 12/26/2019: Stage IIIC (pT3, pN3a, cM0, G3, ER-, PR-, HER2-) - Signed by Truitt Merle, MD on 01/28/2020    Malignant neoplasm of upper-outer quadrant of left breast in female, estrogen receptor negative ([place])  10/27/2019 Mammogram   IMPRESSION: 1. Right breast calcifications spanning 2.6 cm in the upper slightly medial breast are indeterminate.   2. Left breast dominant mass at 3 o'clock, 6 cm from nipple measuring 3x3.9x2.9 cm is highly suspicious. There is overlying skin thickening, skin involvement is not excluded.   3. Left breast mass/distorted tissue at 1 o'clock, 4cm from nipple measuring 3.0x1.2x2.9 cm is likely in contiguity with the dominant mass at 3 o'clock and is also highly suspicious. With the dominant lesion the overall span a disease is approximately 6 cm.   4. Left breast distortion at 2 o'clock 10 cm from the nipple is Suspicious, measuring 1.0 x 0.8 cm.   5.  Abnormal left axillary lymph nodes (2). Cortical thickness measures up to 0.6 cm.    10/28/2019 Initial Biopsy    Diagnosis 1. Breast, left, needle core biopsy, 3 o'clock, 5cmfn - INVASIVE MAMMARY CARCINOMA. 2. Breast, left, needle core biopsy, 2 o'clock, 10cmfn - INVASIVE MAMMARY CARCINOMA. 3. 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 Microscopic Comment 1. -3. Overall, immunohistochemistry favors a pronounced desmoplastic response, but metaplastic carcinoma cannot be excluded (Epithelioid component: CKAE1AE3 strong +, CK5/6 weak +). The greatest linear extent of tumor in any one core in specimen 1 is 14 mm. The greatest linear extent of tumor in any one core in specimen 2 is 16 mm.   10/28/2019 Receptors her2   3. PROGNOSTIC INDICATORS Results: IMMUNOHISTOCHEMICAL AND MORPHOMETRIC ANALYSIS PERFORMED MANUALLY The tumor cells are EQUIVOCAL for Her2 (2+). Her2 by FISH will be performed and results reported separately. Estrogen Receptor: 0%, NEGATIVE Progesterone Receptor: 0%, NEGATIVE Proliferation Marker Ki67: 10%    3. FLUORESCENCE IN-SITU HYBRIDIZATION Results: GROUP 5: HER2 **NEGATIVE** Equivocal form of amplification of the HER2 gene was detected in the IHC 2+ tissue sample received from this individual. HER2 FISH was performed by a technologist and cell imaging and analysis on the [employer].   10/28/2019 Cancer Staging   Staging form: Breast, AJCC 8th Edition - Clinical stage from 10/28/2019: Stage IIIB (cT2, cN1, cM0, G3, ER-, PR-, HER2-) - Signed by Eppie Gibson, MD on 11/05/2019   10/31/2019 Initial Diagnosis   Malignant neoplasm of upper-outer quadrant of left breast in female, estrogen receptor negative ([place])   10/31/2019 Pathology Results   Diagnosis Breast, right, needle core biopsy, UOQ, posterior - FOCAL USUAL DUCTAL HYPERPLASIA AND FIBROCYSTIC CHANGES WITH CALCIFICATIONS - FIBROADENOMATOID CHANGES - NO  MALIGNANCY IDENTIFIED Microscopic Comment These results were called to [facility] on November 03, 2019.    11/05/2019 Genetic Testing   She declined Genetic testing    11/21/2019 Imaging   CT CAP w contrast  IMPRESSION: 1. Left breast mass and prominent left axillary lymph nodes, containing biopsy marking clips, consistent with newly diagnosed breast malignancy. 2. No definite evidence of distant metastatic disease in the chest, abdomen, or pelvis. 3. There are occasional small pulmonary nodules, measuring 2-3 mm, most likely incidental sequelae of prior infection or inflammation although metastatic disease is not excluded. Attention on follow-up. 4. Hepatic steatosis. 5. Aortic Atherosclerosis (ICD10-I70.0).     11/21/2019 Imaging   Bone Scan Whole Body IMPRESSION: 1. Single focus of uptake associated with a subacute fracture of LEFT anterior fourth rib. 2. No additional areas of abnormal uptake.   12/26/2019 Cancer Staging   Staging form: Breast, AJCC 8th Edition - Pathologic stage from 12/26/2019: Stage IIIC (pT3, pN3a, cM0, G3, ER-, PR-, HER2-) - Signed by Truitt Merle, MD on 01/28/2020   12/26/2019 Surgery   LEFT MASTECTOMY MODIFIED RADICAL and PAC Placement by Dr Marlou Starks   12/26/2019 Pathology Results   FINAL MICROSCOPIC DIAGNOSIS:   A. 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  - Ductal carcinoma in situ, high nuclear grade with central necrosis.  - Margins of resection:  - Metaplastic carcinoma focally involves the anterior margin and is < 1  mm from the posterior margin.  - DCIS is < 1 mm from the anterior margin.  - Metastatic carcinoma in (13) of (16) lymph nodes with extranodal  extension.  - Biopsy clip sites in breast and one lymph node.  - See oncology table.    ADDENDUM:  PROGNOSTIC INDICATOR RESULTS:  Immunohistochemical and morphometric analysis performed manually  The tumor cells are EQUIVOCAL for Her2 (2+). Her2 FISH has been ordered  and will be reported in an  addendum.  Estrogen Receptor:       NEGATIVE  Progesterone Receptor:   NEGATIVE  Proliferation Marker Ki-67:   30%    ADDENDUM:  FLOURESCENCE IN-SITU HYBRIDIZATION RESULTS:  GROUP 5:   HER2 NEGATIVE   01/28/2020 Echocardiogram   Baseline Echo  IMPRESSIONS     1. Left ventricular ejection fraction, by estimation, is 60 to 65%. The  left ventricle has normal function. The left ventricle has no regional  wall motion abnormalities. Left ventricular diastolic parameters are  consistent with Grade I diastolic  dysfunction (impaired relaxation). The average left ventricular global  longitudinal strain is -19.8 %.   2. Right ventricular systolic function is normal. The right ventricular  size is normal. Tricuspid regurgitation signal is inadequate for assessing  PA pressure.   3. The mitral valve is normal in structure. No evidence of mitral valve  regurgitation. No evidence of mitral stenosis.   4. The aortic valve is normal in structure. Aortic valve regurgitation is  not visualized. No aortic stenosis is present.   5. The inferior vena cava is normal in size with greater than 50%  respiratory variability, suggesting right atrial pressure of 3 mmHg.    02/05/2020 -  Chemotherapy   Keytruda q3weeks (to be taken for 1 whole year) with weekly Carboplatin/Taxol for 12 weeks starting 02/05/20-05/13/20 followed by Adriamycin/Cytoxan q2weeks X4 starting 05/20/20      CURRENT THERAPY:  Keytrudaq3weeks(to be taken for 1 whole year)with weeklyCarboplatin/Taxolfor 12 weeks  02/05/20-1/6/22followed by Adriamycin/Cytoxanq2weeksX4 starting 05/20/20  INTERVAL HISTORY:  Lindsay [name] is here for a follow up and treatment. She presents to the clinic alone.  She tolerated the first cycle AC moderately well, had moderate fatigue and some brain foggy, did recover well this week. She lost 4 lbs since last cycle chemo. NO fever or chills. No other complains.   All other systems were reviewed  with the patient and are negative.  MEDICAL HISTORY:  Past Medical History:  Diagnosis Date  . Bronchitis   . Family history of bone cancer   . Family history of breast cancer   . Family history of prostate cancer   . Fibromyalgia   . HCV (hepatitis C virus)   . MRSA (methicillin resistant Staphylococcus aureus) 2012    SURGICAL HISTORY: Past Surgical History:  Procedure Laterality Date  . MASTECTOMY MODIFIED RADICAL Left 12/26/2019   Procedure: LEFT MASTECTOMY MODIFIED RADICAL;  Surgeon: [name], MD;  Location: [facility];  Service: General;  Laterality: Left;  PEC BLOCK  . PORTACATH PLACEMENT Right 12/26/2019   Procedure: INSERTION PORT-A-CATH WITH ULTRASOUND GUIDANCE;  Surgeon: [name], MD;  Location: [facility];  Service: General;  Laterality: Right;    I have reviewed the social history and family history with the patient and they are unchanged from previous note.  ALLERGIES:  is allergic to codeine.  MEDICATIONS:  Current Outpatient Medications  Medication Sig Dispense Refill  . acetaminophen (TYLENOL) 650 MG CR tablet Take 650 mg by mouth every 8 (eight) hours as needed for pain.     Marland Kitchen amoxicillin-clavulanate (AUGMENTIN) 875-125 MG tablet Take 1 tablet by mouth 2 (two) times daily. 14 tablet 0  . ascorbic acid (VITAMIN C) 250 MG CHEW Chew by mouth.    . dexamethasone (DECADRON) 4 MG tablet Take 2 tablets (8 mg total) by mouth daily. Start the day after carboplatin chemotherapy for 3 days. 30 tablet 1  . diphenoxylate-atropine (LOMOTIL) 2.5-0.025 MG tablet Take 1-2 tablets by mouth 4 (four) times daily as needed for diarrhea or loose stools. 30 tablet 1  . gabapentin (NEURONTIN) 300 MG capsule Take 300 mg by mouth 3 (three) times daily as needed.    . lidocaine-prilocaine (EMLA) cream Apply 1 application topically as needed. 30 g 2  . methocarbamol (ROBAXIN) 750 MG tablet Take 1 tablet (750 mg total) by mouth 4 (four) times daily as needed (use for muscle cramps/pain). 30  tablet 2  . omeprazole (PRILOSEC) 20 MG capsule Take 1 capsule (20 mg total) by mouth daily. 30 capsule 2  . ondansetron (ZOFRAN) 8 MG tablet Take 1 tablet (8 mg total) by mouth 2 (two) times daily as needed for refractory nausea / vomiting. Start on day 3 after carboplatin chemo. 30 tablet 1  . prochlorperazine (COMPAZINE) 10 MG tablet Take 1 tablet (10 mg total) by mouth every 6 (six) hours as needed (Nausea or vomiting). 30 tablet 1   No current facility-administered medications for this visit.    PHYSICAL EXAMINATION: ECOG PERFORMANCE STATUS: 1 - Symptomatic but completely ambulatory  Vitals:   06/03/20 1026  BP: 134/70  Pulse: 75  Resp: 15  Temp: 97.8 F (36.6 C)  SpO2: 98%   Filed Weights   06/03/20 1026  Weight: 177 lb 8 oz (80.5 kg)    GENERAL:alert, no distress and comfortable SKIN: skin color, texture, turgor are normal, no rashes or significant lesions EYES: normal, Conjunctiva are pink and non-injected, sclera clear Musculoskeletal:no cyanosis of digits  and no clubbing  NEURO: alert & oriented x 3 with fluent speech, no focal motor/sensory deficits  LABORATORY DATA:  I have reviewed the data as listed CBC Latest Ref Rng & Units 06/03/2020 05/27/2020 05/20/2020  WBC 4.0 - 10.5 K/uL 5.2 1.6(L) 3.3(L)  Hemoglobin 12.0 - 15.0 g/dL 10.4(L) 10.1(L) 10.5(L)  Hematocrit 36.0 - 46.0 % 29.9(L) 29.3(L) 30.3(L)  Platelets 150 - 400 K/uL 247 73(L) 161     CMP Latest Ref Rng & Units 06/03/2020 05/27/2020 05/20/2020  Glucose 70 - 99 mg/dL 103(H) 103(H) 103(H)  BUN 8 - 23 mg/dL 4(L) 8 11  Creatinine 0.44 - 1.00 mg/dL 0.69 0.63 0.68  Sodium 135 - 145 mmol/L 139 136 140  Potassium 3.5 - 5.1 mmol/L 3.4(L) 3.8 3.5  Chloride 98 - 111 mmol/L 105 104 109  CO2 22 - 32 mmol/L '26 25 25  ' Calcium 8.9 - 10.3 mg/dL 8.8(L) 8.7(L) 8.8(L)  Total Protein 6.5 - 8.1 g/dL 5.9(L) 5.8(L) 5.9(L)  Total Bilirubin 0.3 - 1.2 mg/dL 0.3 0.5 0.6  Alkaline Phos 38 - 126 U/L 82 74 66  AST 15 - 41 U/L 33  21 43(H)  ALT 0 - 44 U/L '11 10 17      ' RADIOGRAPHIC STUDIES: I have personally reviewed the radiological images as listed and agreed with the findings in the report. No results found.   ASSESSMENT & PLAN:  Lindsay [name] is a 65 y.o. female with   1.Left breast Multifocal Metaplastic cancer,pT3N3aM0,including one unresectable axillary node, metaplastic carcinoma,ER-/PR-/HER2-, grade IIIC -She was diagnosed in 10/2019 with left breastmetaplasticcancer, triple negative disease metastatic to left axillary LN. Her 11/2019 CT CAP and bone scan was negative for distant metastasis.  -She underwent left mastectomy with Dr Marlou Starks on 12/26/19.Surgical pathshowed6cm multifocal metaplastic carcinomaand13/16 positive LN but unfortunately 1 positive LN was not able to be removed given it invasion ofa vessel. -She startedadjuvant chemo with Keytrudaq3weeks(to be taken for 1 whole year)withweekly Carboplatin/Taxolfor 12 weeksbeginning9/30/21-1/6/22followed by Adriamycin/Cytoxanq2weeks X4 starting 05/20/20.  -S/p C1 AC, she tolerated moderately well, had a moderate fatigue and the brain foggy, last 4 pounds, able to recover well -Lab reviewed, adequate for treatment, will proceed with cycle 2 AC today -We discussed proceeding with adjuvant radiation after she completes 4 cycles AC chemotherapy -f/u in 2 weeks    2. Hepatitis C,untreated,diagnosed in [place] her to ID or [employer] for treatment after she completed chemo. -She also notes a history of MRSA which she is not sure has completed cleared. -We will watch her liver functions closely during the chemotherapy.We discussed her that she may have hepatitis C flare when she has chemo  3. Social and financial support  -She has no children and no close relatives. She does have close friends who are supportive.  -She did not have medical insurance for many years. She recently applied for blue cross and blue  shield which is not effective yet.  4. Mild anemia -Secondary to chemotherapy, mild, will continue monitoring. -Stable  PLAN: -lab reviewed, adequate for treatment, will proceed C2 AC today, with G-CSF on day 3 -Continue Keytruda every 3 weeks, next due in 2 weeks  -Lab, flush, follow-up and AC in 2 weeks -Plan to repeat ultrasound of left axilla (she has 1 unresectable metastatic node) on 2/10   No problem-specific Assessment & Plan notes found for this encounter.   No orders of the defined types were placed in this encounter.  All questions were answered. The patient knows to call  the clinic with any problems, questions or concerns. No barriers to learning was detected. The total time spent in the appointment was 30 minutes.     Truitt Merle, MD 06/03/2020   I, Joslyn Devon, am acting as scribe for Truitt Merle, MD.   I have reviewed the above documentation for accuracy and completeness, and I agree with the above.

## 2020-06-03 ENCOUNTER — Inpatient Hospital Stay: Payer: Medicare Other

## 2020-06-03 ENCOUNTER — Inpatient Hospital Stay (HOSPITAL_BASED_OUTPATIENT_CLINIC_OR_DEPARTMENT_OTHER): Payer: Medicare Other | Admitting: Hematology

## 2020-06-03 ENCOUNTER — Encounter: Payer: Self-pay | Admitting: Hematology

## 2020-06-03 ENCOUNTER — Other Ambulatory Visit: Payer: Self-pay

## 2020-06-03 VITALS — BP 134/70 | HR 75 | Temp 97.8°F | Resp 15 | Ht 61.0 in | Wt 177.5 lb

## 2020-06-03 DIAGNOSIS — C50412 Malignant neoplasm of upper-outer quadrant of left female breast: Secondary | ICD-10-CM

## 2020-06-03 DIAGNOSIS — Z171 Estrogen receptor negative status [ER-]: Secondary | ICD-10-CM

## 2020-06-03 DIAGNOSIS — Z5112 Encounter for antineoplastic immunotherapy: Secondary | ICD-10-CM | POA: Diagnosis not present

## 2020-06-03 DIAGNOSIS — Z95828 Presence of other vascular implants and grafts: Secondary | ICD-10-CM

## 2020-06-03 LAB — CBC WITH DIFFERENTIAL (CANCER CENTER ONLY)
Abs Immature Granulocytes: 0.11 10*3/uL — ABNORMAL HIGH (ref 0.00–0.07)
Basophils Absolute: 0.1 10*3/uL (ref 0.0–0.1)
Basophils Relative: 2 %
Eosinophils Absolute: 0.1 10*3/uL (ref 0.0–0.5)
Eosinophils Relative: 1 %
HCT: 29.9 % — ABNORMAL LOW (ref 36.0–46.0)
Hemoglobin: 10.4 g/dL — ABNORMAL LOW (ref 12.0–15.0)
Immature Granulocytes: 2 %
Lymphocytes Relative: 30 %
Lymphs Abs: 1.6 10*3/uL (ref 0.7–4.0)
MCH: 34.6 pg — ABNORMAL HIGH (ref 26.0–34.0)
MCHC: 34.8 g/dL (ref 30.0–36.0)
MCV: 99.3 fL (ref 80.0–100.0)
Monocytes Absolute: 0.9 10*3/uL (ref 0.1–1.0)
Monocytes Relative: 17 %
Neutro Abs: 2.5 10*3/uL (ref 1.7–7.7)
Neutrophils Relative %: 48 %
Platelet Count: 247 10*3/uL (ref 150–400)
RBC: 3.01 MIL/uL — ABNORMAL LOW (ref 3.87–5.11)
RDW: 13.3 % (ref 11.5–15.5)
WBC Count: 5.2 10*3/uL (ref 4.0–10.5)
nRBC: 0 % (ref 0.0–0.2)

## 2020-06-03 LAB — CMP (CANCER CENTER ONLY)
ALT: 11 U/L (ref 0–44)
AST: 33 U/L (ref 15–41)
Albumin: 3.8 g/dL (ref 3.5–5.0)
Alkaline Phosphatase: 82 U/L (ref 38–126)
Anion gap: 8 (ref 5–15)
BUN: 4 mg/dL — ABNORMAL LOW (ref 8–23)
CO2: 26 mmol/L (ref 22–32)
Calcium: 8.8 mg/dL — ABNORMAL LOW (ref 8.9–10.3)
Chloride: 105 mmol/L (ref 98–111)
Creatinine: 0.69 mg/dL (ref 0.44–1.00)
GFR, Estimated: >60 mL/min (ref >60)
Glucose, Bld: 103 mg/dL — ABNORMAL HIGH (ref 70–99)
Potassium: 3.4 mmol/L — ABNORMAL LOW (ref 3.5–5.1)
Sodium: 139 mmol/L (ref 135–145)
Total Bilirubin: 0.3 mg/dL (ref 0.3–1.2)
Total Protein: 5.9 g/dL — ABNORMAL LOW (ref 6.5–8.1)

## 2020-06-03 MED ORDER — HEPARIN SOD (PORK) LOCK FLUSH 100 UNIT/ML IV SOLN
500.0000 [IU] | Freq: Once | INTRAVENOUS | Status: AC | PRN
Start: 2020-06-03 — End: 2020-06-03
  Administered 2020-06-03: 500 [IU]
  Filled 2020-06-03: qty 5

## 2020-06-03 MED ORDER — SODIUM CHLORIDE 0.9 % IV SOLN
Freq: Once | INTRAVENOUS | Status: AC
Start: 2020-06-03 — End: 2020-06-03
  Filled 2020-06-03: qty 250

## 2020-06-03 MED ORDER — METHYLPREDNISOLONE SODIUM SUCC 40 MG IJ SOLR
INTRAMUSCULAR | Status: AC
  Filled 2020-06-03: qty 1

## 2020-06-03 MED ORDER — DOXORUBICIN HCL CHEMO IV INJECTION 2 MG/ML
60.0000 mg/m2 | Freq: Once | INTRAVENOUS | Status: AC
  Administered 2020-06-03: 116 mg via INTRAVENOUS
  Filled 2020-06-03: qty 58

## 2020-06-03 MED ORDER — SODIUM CHLORIDE 0.9% FLUSH
10.0000 mL | INTRAVENOUS | Status: DC | PRN
  Administered 2020-06-03: 10 mL
  Filled 2020-06-03: qty 10

## 2020-06-03 MED ORDER — SODIUM CHLORIDE 0.9 % IV SOLN
150.0000 mg | Freq: Once | INTRAVENOUS | Status: AC
  Administered 2020-06-03: 150 mg via INTRAVENOUS
  Filled 2020-06-03: qty 150

## 2020-06-03 MED ORDER — SODIUM CHLORIDE 0.9% FLUSH
10.0000 mL | Freq: Once | INTRAVENOUS | Status: AC
  Administered 2020-06-03: 10 mL
  Filled 2020-06-03: qty 10

## 2020-06-03 MED ORDER — METHYLPREDNISOLONE SODIUM SUCC 40 MG IJ SOLR
10.0000 mg | Freq: Once | INTRAMUSCULAR | Status: AC
  Administered 2020-06-03: 10 mg via INTRAVENOUS

## 2020-06-03 MED ORDER — SODIUM CHLORIDE 0.9 % IV SOLN
600.0000 mg/m2 | Freq: Once | INTRAVENOUS | Status: AC
  Administered 2020-06-03: 1160 mg via INTRAVENOUS
  Filled 2020-06-03: qty 58

## 2020-06-03 MED ORDER — PALONOSETRON HCL INJECTION 0.25 MG/5ML
0.2500 mg | Freq: Once | INTRAVENOUS | Status: AC
  Administered 2020-06-03: 0.25 mg via INTRAVENOUS

## 2020-06-03 MED ORDER — PALONOSETRON HCL INJECTION 0.25 MG/5ML
INTRAVENOUS | Status: AC
  Filled 2020-06-03: qty 5

## 2020-06-03 NOTE — Patient Instructions (Signed)
[place] Discharge Instructions for Patients Receiving Chemotherapy  Today you received the following chemotherapy agents: Doxorubicin, cyclophosphamide   To help prevent nausea and vomiting after your treatment, we encourage you to take your nausea medication as needed.   If you develop nausea and vomiting that is not controlled by your nausea medication, call the clinic.   BELOW ARE SYMPTOMS THAT SHOULD BE REPORTED IMMEDIATELY:  *FEVER GREATER THAN 100.5 F  *CHILLS WITH OR WITHOUT FEVER  NAUSEA AND VOMITING THAT IS NOT CONTROLLED WITH YOUR NAUSEA MEDICATION  *UNUSUAL SHORTNESS OF BREATH  *UNUSUAL BRUISING OR BLEEDING  TENDERNESS IN MOUTH AND THROAT WITH OR WITHOUT PRESENCE OF ULCERS  *URINARY PROBLEMS  *BOWEL PROBLEMS  UNUSUAL RASH Items with * indicate a potential emergency and should be followed up as soon as possible.  Feel free to call the clinic should you have any questions or concerns. The clinic phone number is [phone].  Please show the [place] at check-in to the Emergency Department and triage nurse.  Doxorubicin injection What is this medicine? DOXORUBICIN (dox oh ROO bi sin) is a chemotherapy drug. It is used to treat many kinds of cancer like leukemia, lymphoma, neuroblastoma, sarcoma, and Wilms' tumor. It is also used to treat bladder cancer, breast cancer, lung cancer, ovarian cancer, stomach cancer, and thyroid cancer. This medicine may be used for other purposes; ask your health care provider or pharmacist if you have questions. COMMON BRAND NAME(S): Adriamycin, Adriamycin PFS, Adriamycin RDF, Rubex What should I tell my health care provider before I take this medicine? They need to know if you have any of these conditions:  heart disease  history of low blood counts caused by a medicine  liver disease  recent or ongoing radiation therapy  an unusual or allergic reaction to doxorubicin, other chemotherapy agents,  other medicines, foods, dyes, or preservatives  pregnant or trying to get pregnant  breast-feeding How should I use this medicine? This drug is given as an infusion into a vein. It is administered in a hospital or clinic by a specially trained health care professional. If you have pain, swelling, burning or any unusual feeling around the site of your injection, tell your health care professional right away. Talk to your pediatrician regarding the use of this medicine in children. Special care may be needed. Overdosage: If you think you have taken too much of this medicine contact a poison control center or emergency room at once. NOTE: This medicine is only for you. Do not share this medicine with others. What if I miss a dose? It is important not to miss your dose. Call your doctor or health care professional if you are unable to keep an appointment. What may interact with this medicine? This medicine may interact with the following medications:  6-mercaptopurine  paclitaxel  phenytoin  St. John's Wort  trastuzumab  verapamil This list may not describe all possible interactions. Give your health care provider a list of all the medicines, herbs, non-prescription drugs, or dietary supplements you use. Also tell them if you smoke, drink alcohol, or use illegal drugs. Some items may interact with your medicine. What should I watch for while using this medicine? This drug may make you feel generally unwell. This is not uncommon, as chemotherapy can affect healthy cells as well as cancer cells. Report any side effects. Continue your course of treatment even though you feel ill unless your doctor tells you to stop. There is a maximum amount  of this medicine you should receive throughout your life. The amount depends on the medical condition being treated and your overall health. Your doctor will watch how much of this medicine you receive in your lifetime. Tell your doctor if you have taken  this medicine before. You may need blood work done while you are taking this medicine. Your urine may turn red for a few days after your dose. This is not blood. If your urine is dark or brown, call your doctor. In some cases, you may be given additional medicines to help with side effects. Follow all directions for their use. Call your doctor or health care professional for advice if you get a fever, chills or sore throat, or other symptoms of a cold or flu. Do not treat yourself. This drug decreases your body's ability to fight infections. Try to avoid being around people who are sick. This medicine may increase your risk to bruise or bleed. Call your doctor or health care professional if you notice any unusual bleeding. Talk to your doctor about your risk of cancer. You may be more at risk for certain types of cancers if you take this medicine. Do not become pregnant while taking this medicine or for 6 months after stopping it. Women should inform their doctor if they wish to become pregnant or think they might be pregnant. Men should not father a child while taking this medicine and for 6 months after stopping it. There is a potential for serious side effects to an unborn child. Talk to your health care professional or pharmacist for more information. Do not breast-feed an infant while taking this medicine. This medicine has caused ovarian failure in some women and reduced sperm counts in some men This medicine may interfere with the ability to have a child. Talk with your doctor or health care professional if you are concerned about your fertility. This medicine may cause a decrease in Co-Enzyme Q-10. You should make sure that you get enough Co-Enzyme Q-10 while you are taking this medicine. Discuss the foods you eat and the vitamins you take with your health care professional. What side effects may I notice from receiving this medicine? Side effects that you should report to your doctor or health  care professional as soon as possible:  allergic reactions like skin rash, itching or hives, swelling of the face, lips, or tongue  breathing problems  chest pain  fast or irregular heartbeat  low blood counts - this medicine may decrease the number of white blood cells, red blood cells and platelets. You may be at increased risk for infections and bleeding.  pain, redness, or irritation at site where injected  signs of infection - fever or chills, cough, sore throat, pain or difficulty passing urine  signs of decreased platelets or bleeding - bruising, pinpoint red spots on the skin, black, tarry stools, blood in the urine  swelling of the ankles, feet, hands  tiredness  weakness Side effects that usually do not require medical attention (report to your doctor or health care professional if they continue or are bothersome):  diarrhea  hair loss  mouth sores  nail discoloration or damage  nausea  red colored urine  vomiting This list may not describe all possible side effects. Call your doctor for medical advice about side effects. You may report side effects to FDA at 1-800-FDA-1088. Where should I keep my medicine? This drug is given in a hospital or clinic and will not be stored at home.  NOTE: This sheet is a summary. It may not cover all possible information. If you have questions about this medicine, talk to your doctor, pharmacist, or health care provider.  2021 Elsevier/Gold Standard (2016-12-06 11:01:26)  Cyclophosphamide Injection What is this medicine? CYCLOPHOSPHAMIDE (sye kloe FOSS fa mide) is a chemotherapy drug. It slows the growth of cancer cells. This medicine is used to treat many types of cancer like lymphoma, myeloma, leukemia, breast cancer, and ovarian cancer, to name a few. This medicine may be used for other purposes; ask your health care provider or pharmacist if you have questions. COMMON BRAND NAME(S): Cytoxan, Neosar What should I tell my  health care provider before I take this medicine? They need to know if you have any of these conditions:  heart disease  history of irregular heartbeat  infection  kidney disease  liver disease  low blood counts, like white cells, platelets, or red blood cells  on hemodialysis  recent or ongoing radiation therapy  scarring or thickening of the lungs  trouble passing urine  an unusual or allergic reaction to cyclophosphamide, other medicines, foods, dyes, or preservatives  pregnant or trying to get pregnant  breast-feeding How should I use this medicine? This drug is usually given as an injection into a vein or muscle or by infusion into a vein. It is administered in a hospital or clinic by a specially trained health care professional. Talk to your pediatrician regarding the use of this medicine in children. Special care may be needed. Overdosage: If you think you have taken too much of this medicine contact a poison control center or emergency room at once. NOTE: This medicine is only for you. Do not share this medicine with others. What if I miss a dose? It is important not to miss your dose. Call your doctor or health care professional if you are unable to keep an appointment. What may interact with this medicine?  amphotericin B  azathioprine  certain antivirals for HIV or hepatitis  certain medicines for blood pressure, heart disease, irregular heart beat  certain medicines that treat or prevent blood clots like warfarin  certain other medicines for cancer  cyclosporine  etanercept  indomethacin  medicines that relax muscles for surgery  medicines to increase blood counts  metronidazole This list may not describe all possible interactions. Give your health care provider a list of all the medicines, herbs, non-prescription drugs, or dietary supplements you use. Also tell them if you smoke, drink alcohol, or use illegal drugs. Some items may interact with  your medicine. What should I watch for while using this medicine? Your condition will be monitored carefully while you are receiving this medicine. You may need blood work done while you are taking this medicine. Drink water or other fluids as directed. Urinate often, even at night. Some products may contain alcohol. Ask your health care professional if this medicine contains alcohol. Be sure to tell all health care professionals you are taking this medicine. Certain medicines, like metronidazole and disulfiram, can cause an unpleasant reaction when taken with alcohol. The reaction includes flushing, headache, nausea, vomiting, sweating, and increased thirst. The reaction can last from 30 minutes to several hours. Do not become pregnant while taking this medicine or for 1 year after stopping it. Women should inform their health care professional if they wish to become pregnant or think they might be pregnant. Men should not father a child while taking this medicine and for 4 months after stopping it. There is  potential for serious side effects to an unborn child. Talk to your health care professional for more information. Do not breast-feed an infant while taking this medicine or for 1 week after stopping it. This medicine has caused ovarian failure in some women. This medicine may make it more difficult to get pregnant. Talk to your health care professional if you are concerned about your fertility. This medicine has caused decreased sperm counts in some men. This may make it more difficult to father a child. Talk to your health care professional if you are concerned about your fertility. Call your health care professional for advice if you get a fever, chills, or sore throat, or other symptoms of a cold or flu. Do not treat yourself. This medicine decreases your body's ability to fight infections. Try to avoid being around people who are sick. Avoid taking medicines that contain aspirin, acetaminophen,  ibuprofen, naproxen, or ketoprofen unless instructed by your health care professional. These medicines may hide a fever. Talk to your health care professional about your risk of cancer. You may be more at risk for certain types of cancer if you take this medicine. If you are going to need surgery or other procedure, tell your health care professional that you are using this medicine. Be careful brushing or flossing your teeth or using a toothpick because you may get an infection or bleed more easily. If you have any dental work done, tell your dentist you are receiving this medicine. What side effects may I notice from receiving this medicine? Side effects that you should report to your doctor or health care professional as soon as possible:  allergic reactions like skin rash, itching or hives, swelling of the face, lips, or tongue  breathing problems  nausea, vomiting  signs and symptoms of bleeding such as bloody or black, tarry stools; red or dark brown urine; spitting up blood or brown material that looks like coffee grounds; red spots on the skin; unusual bruising or bleeding from the eyes, gums, or nose  signs and symptoms of heart failure like fast, irregular heartbeat, sudden weight gain; swelling of the ankles, feet, hands  signs and symptoms of infection like fever; chills; cough; sore throat; pain or trouble passing urine  signs and symptoms of kidney injury like trouble passing urine or change in the amount of urine  signs and symptoms of liver injury like dark yellow or brown urine; general ill feeling or flu-like symptoms; light-colored stools; loss of appetite; nausea; right upper belly pain; unusually weak or tired; yellowing of the eyes or skin Side effects that usually do not require medical attention (report to your doctor or health care professional if they continue or are bothersome):  confusion  decreased hearing  diarrhea  facial flushing  hair  loss  headache  loss of appetite  missed menstrual periods  signs and symptoms of low red blood cells or anemia such as unusually weak or tired; feeling faint or lightheaded; falls  skin discoloration This list may not describe all possible side effects. Call your doctor for medical advice about side effects. You may report side effects to FDA at 1-800-FDA-1088. Where should I keep my medicine? This drug is given in a hospital or clinic and will not be stored at home. NOTE: This sheet is a summary. It may not cover all possible information. If you have questions about this medicine, talk to your doctor, pharmacist, or health care provider.  2021 Elsevier/Gold Standard (2019-01-27 09:53:29)

## 2020-06-03 NOTE — Patient Instructions (Signed)
Central Line, Adult A central line is a long, thin tube (catheter) that is put into a vein so that it goes to a large vein above your heart. It can be used to:  Give you medicine or fluids.  Give you food and nutrients.  Take blood or give you blood for testing or treatments. Types of central lines There are four main types of central lines:  Peripherally inserted central catheter (PICC) line. This type is usually put in the upper arm and goes up the arm to the heart.  Tunneled central line. This type is placed in a large vein in the neck, chest, or groin. It is tunneled under the skin and brought out through a second incision.  Non-tunneled central line. This type is used for a shorter time than other types, usually for 7 days at the most. It is inserted in the neck, chest, or groin.  Implanted port. This type can stay in place longer than other types of central lines. It is normally put in the upper chest but can also be placed in the upper arm or the belly. Surgery is needed to put it in and take it out. The type of central line you get will depend on how long you need it and your medical condition.   Tell a doctor about:  Any allergies you have.  All medicines you are taking. These include vitamins, herbs, eye drops, creams, and over-the-counter medicines.  Any problems you or family members have had with anesthetic medicines.  Any blood disorders you have.  Any surgeries you have had.  Any medical conditions you have.  Whether you are pregnant or may be pregnant. What are the risks? Generally, central lines are safe. However, problems may occur, including:  Infection.  A blood clot.  Bleeding from the place where the central line was inserted.  Getting a hole or crack in the central line. If this happens, the central line will need to be replaced.  Central line failure.  The catheter moving or coming out of place. What happens before the  procedure? Medicines  Ask your doctor about changing or stopping: ? Your normal medicines. ? Vitamins, herbs, and supplements. ? Over-the-counter medicines.  Do not take aspirin or ibuprofen unless you are told to. General instructions  Follow instructions from your doctor about eating or drinking.  For your safety, your doctor may: ? Mark the area of the procedure. ? Remove hair at the procedure site. ? Ask you to wash with a soap that kills germs.  Plan to have a responsible adult take you home from the hospital or clinic.  If you will be going home right after the procedure, plan to have a responsible adult care for you for the time you are told. This is important. What happens during the procedure?  An IV tube will be put into one of your veins.  You may be given: ? A sedative. This medicine helps you relax. ? Anesthetics. These medicines numb certain areas of your body.  Your skin will be cleaned with a germ-killing (antiseptic) solution. You may be covered with clean drapes.  Your blood pressure, heart rate, breathing rate, and blood oxygen level will be monitored during the procedure.  The central line will be put into the vein and moved through it to the correct spot. The doctor may use X-ray equipment to help guide the central line to the right place.  A bandage (dressing) will be placed over the insertion area.   The procedure may vary among doctors and hospitals. What can I expect after the procedure?  You will be monitored until you leave the hospital or clinic. This includes checking your blood pressure, heart rate, breathing rate, and blood oxygen level.  Caps may be placed on the ends of the central line tubing.  If you were given a sedative during your procedure, do not drive or use machines until your doctor says that it is safe. Follow these instructions at home: Caring for the tube  Follow instructions from your doctor about: ? Flushing the  tube. ? Cleaning the tube and the area around it.  Only use germ-free (sterile) supplies to flush. The supplies should be from your doctor, a pharmacy, or another place that your doctor recommends.  Before you flush the tube or clean the area around the tube: ? Wash your hands with soap and water for at least 20 seconds. If you cannot use soap and water, use hand sanitizer. ? Clean the central line hub with rubbing alcohol. To do this:  Scrub it using a twisting motion and rub for 10 to 15 seconds or for 30 twists. Follow the manufacturer's instructions.  Be sure you scrub the top of the hub, not just the sides. Never reuse alcohol pads.  Let the hub dry before use. Keep it from touching anything while drying.   Caring for your skin  Check the skin around the central line every day for signs of infection. Check for: ? Redness, swelling, or pain. ? Fluid or blood. ? Warmth. ? Pus or a bad smell.  Keep the area where the tube was put in clean and dry.  Change bandages only as told by your doctor.  Keep your bandage dry. If a bandage gets wet, have it changed right away. General instructions  Keep the tube clamped, unless it is being used.  If you or someone else accidentally pulls on the tube, make sure: ? The bandage is okay. ? There is no bleeding. ? The tube has not been pulled out.  Do not use scissors or sharp objects near the tube.  Do not take baths, swim, or use a hot tub until your doctor says it is okay. Ask your doctor if you may take showers. You may only be allowed to take sponge baths.  Ask your doctor what activities are safe for you. Your doctor may tell you not to lift anything or move your arm too much.  Take over-the-counter and prescription medicines only as told by your doctor.  Keep all follow-up visits. Storing and throwing away supplies  Keep your supplies in a clean, dry location.  Throw away any used syringes in a container that is only for  sharp items (sharps container). You can buy a sharps container from a pharmacy, or you can make one by using an empty hard plastic bottle with a cover.  Place any used bandages or infusion bags into a plastic bag. Throw that bag in the trash. Contact a doctor if:  You have any of these signs of infection where the tube was put in: ? Redness, swelling, or pain. ? Fluid or blood. ? Warmth. ? Pus or a bad smell. Get help right away if:  You have: ? A fever or chills. ? Shortness of breath. ? Pain in your chest. ? A fast heartbeat. ? Swelling in your neck, face, chest, or arm.  You feel dizzy or you faint.  There are red lines coming from   where the tube was put in.  The area where the tube was put in is bleeding and the bleeding will not stop.  Your tube is hard to flush.  You do not get a blood return from the tube.  The tube gets loose or comes out.  The tube has a hole or a tear.  The tube leaks. Summary  A central line is a long, thin tube (catheter) that is put in your vein. It can be used to give you medicine, food, or fluids.  Follow instructions from your doctor about flushing and cleaning the tube.  Keep the area where the tube was put in clean and dry.  Ask your doctor what activities are safe for you. This information is not intended to replace advice given to you by your health care provider. Make sure you discuss any questions you have with your health care provider. Document Revised: 12/25/2019 Document Reviewed: 12/25/2019 Elsevier Patient Education  2021 [employer]vier Inc.  

## 2020-06-03 NOTE — Progress Notes (Signed)
Blood return noted before, during and after Adriamycin. 

## 2020-06-05 ENCOUNTER — Inpatient Hospital Stay: Payer: Medicare Other

## 2020-06-05 ENCOUNTER — Other Ambulatory Visit: Payer: Self-pay

## 2020-06-05 VITALS — BP 130/80 | HR 71 | Temp 97.3°F | Resp 17

## 2020-06-05 DIAGNOSIS — C50412 Malignant neoplasm of upper-outer quadrant of left female breast: Secondary | ICD-10-CM

## 2020-06-05 DIAGNOSIS — Z5112 Encounter for antineoplastic immunotherapy: Secondary | ICD-10-CM | POA: Diagnosis not present

## 2020-06-05 MED ORDER — PEGFILGRASTIM-JMDB 6 MG/0.6ML ~~LOC~~ SOSY
6.0000 mg | PREFILLED_SYRINGE | Freq: Once | SUBCUTANEOUS | Status: AC
  Administered 2020-06-05: 6 mg via SUBCUTANEOUS

## 2020-06-05 NOTE — Patient Instructions (Signed)
Pegfilgrastim injection What is this medicine? PEGFILGRASTIM (PEG fil gra stim) is a long-acting granulocyte colony-stimulating factor that stimulates the growth of neutrophils, a type of white blood cell important in the body's fight against infection. It is used to reduce the incidence of fever and infection in patients with certain types of cancer who are receiving chemotherapy that affects the bone marrow, and to increase survival after being exposed to high doses of radiation. This medicine may be used for other purposes; ask your health care provider or pharmacist if you have questions. COMMON BRAND NAME(S): Fulphila, Neulasta, Nyvepria, UDENYCA, Ziextenzo What should I tell my health care provider before I take this medicine? They need to know if you have any of these conditions:  kidney disease  latex allergy  ongoing radiation therapy  sickle cell disease  skin reactions to acrylic adhesives (On-Body Injector only)  an unusual or allergic reaction to pegfilgrastim, filgrastim, other medicines, foods, dyes, or preservatives  pregnant or trying to get pregnant  breast-feeding How should I use this medicine? This medicine is for injection under the skin. If you get this medicine at home, you will be taught how to prepare and give the pre-filled syringe or how to use the On-body Injector. Refer to the patient Instructions for Use for detailed instructions. Use exactly as directed. Tell your healthcare provider immediately if you suspect that the On-body Injector may not have performed as intended or if you suspect the use of the On-body Injector resulted in a missed or partial dose. It is important that you put your used needles and syringes in a special sharps container. Do not put them in a trash can. If you do not have a sharps container, call your pharmacist or healthcare provider to get one. Talk to your pediatrician regarding the use of this medicine in children. While this drug  may be prescribed for selected conditions, precautions do apply. Overdosage: If you think you have taken too much of this medicine contact a poison control center or emergency room at once. NOTE: This medicine is only for you. Do not share this medicine with others. What if I miss a dose? It is important not to miss your dose. Call your doctor or health care professional if you miss your dose. If you miss a dose due to an On-body Injector failure or leakage, a new dose should be administered as soon as possible using a single prefilled syringe for manual use. What may interact with this medicine? Interactions have not been studied. This list may not describe all possible interactions. Give your health care provider a list of all the medicines, herbs, non-prescription drugs, or dietary supplements you use. Also tell them if you smoke, drink alcohol, or use illegal drugs. Some items may interact with your medicine. What should I watch for while using this medicine? Your condition will be monitored carefully while you are receiving this medicine. You may need blood work done while you are taking this medicine. Talk to your health care provider about your risk of cancer. You may be more at risk for certain types of cancer if you take this medicine. If you are going to need a MRI, CT scan, or other procedure, tell your doctor that you are using this medicine (On-Body Injector only). What side effects may I notice from receiving this medicine? Side effects that you should report to your doctor or health care professional as soon as possible:  allergic reactions (skin rash, itching or hives, swelling of   the face, lips, or tongue)  back pain  dizziness  fever  pain, redness, or irritation at site where injected  pinpoint red spots on the skin  red or dark-brown urine  shortness of breath or breathing problems  stomach or side pain, or pain at the shoulder  swelling  tiredness  trouble  passing urine or change in the amount of urine  unusual bruising or bleeding Side effects that usually do not require medical attention (report to your doctor or health care professional if they continue or are bothersome):  bone pain  muscle pain This list may not describe all possible side effects. Call your doctor for medical advice about side effects. You may report side effects to FDA at 1-800-FDA-1088. Where should I keep my medicine? Keep out of the reach of children. If you are using this medicine at home, you will be instructed on how to store it. Throw away any unused medicine after the expiration date on the label. NOTE: This sheet is a summary. It may not cover all possible information. If you have questions about this medicine, talk to your doctor, pharmacist, or health care provider.  2021 Elsevier/Gold Standard (2019-05-16 13:20:51)  

## 2020-06-09 ENCOUNTER — Telehealth: Payer: Self-pay

## 2020-06-09 NOTE — Telephone Encounter (Signed)
Lindsay [name] called stating she has sores on her lips.  The sores in her mouth have healed.  Magic Mouthwash was called in to her pharmacy in [place], I reviewed instructions with her she verbalized understanding.

## 2020-06-11 ENCOUNTER — Telehealth: Payer: Self-pay

## 2020-06-11 ENCOUNTER — Other Ambulatory Visit: Payer: Self-pay

## 2020-06-11 DIAGNOSIS — C50412 Malignant neoplasm of upper-outer quadrant of left female breast: Secondary | ICD-10-CM

## 2020-06-11 DIAGNOSIS — Z8619 Personal history of other infectious and parasitic diseases: Secondary | ICD-10-CM

## 2020-06-11 DIAGNOSIS — Z171 Estrogen receptor negative status [ER-]: Secondary | ICD-10-CM

## 2020-06-11 MED ORDER — LIDOCAINE VISCOUS HCL 2 % MT SOLN: 15.0000 mL | OROMUCOSAL | 2 refills | Status: AC | PRN

## 2020-06-11 MED ORDER — VALACYCLOVIR HCL 1 G PO TABS: 1000.0000 mg | ORAL_TABLET | Freq: Two times a day (BID) | ORAL | 1 refills | Status: AC

## 2020-06-11 NOTE — Telephone Encounter (Signed)
Ms Picker called stating that the sores on her lips are more painfull and are larger.  She states the magic mouthwash is not working.  Reviewed with Dr Burr Medico.  Valacyclovir rx and viscous lidocaine rx sent to her local pharmacy.  Ms Szuch aware

## 2020-06-16 ENCOUNTER — Other Ambulatory Visit: Payer: Medicare Other

## 2020-06-16 ENCOUNTER — Ambulatory Visit: Payer: Medicare Other

## 2020-06-16 ENCOUNTER — Ambulatory Visit: Payer: Medicare Other | Admitting: Nurse Practitioner

## 2020-06-16 NOTE — Progress Notes (Addendum)
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   SUMMARY OF ONCOLOGIC HISTORY: Oncology History Overview Note  Cancer Staging Malignant neoplasm of upper-outer quadrant of left breast in female, estrogen receptor negative (HCC) Staging form: Breast, AJCC 8th Edition - Clinical stage from 10/28/2019: Stage IIIB (cT2, cN1, cM0, G3, ER-, PR-, HER2-) - Signed by Lonie Peak, MD on 11/05/2019 - Pathologic stage from 12/26/2019: Stage IIIC (pT3, pN3a, cM0, G3, ER-, PR-, HER2-) - Signed by Malachy Mood, MD on 01/28/2020    Malignant neoplasm of upper-outer quadrant of left breast in female, estrogen receptor negative (HCC)  10/27/2019 Mammogram   IMPRESSION: 1. Right breast calcifications spanning 2.6 cm in the upper slightly medial breast are indeterminate.   2. Left breast dominant mass at 3 o'clock, 6 cm from nipple measuring 3x3.9x2.9 cm is highly suspicious. There is overlying skin thickening, skin involvement is not excluded.   3. Left breast mass/distorted tissue at 1 o'clock, 4cm from nipple measuring 3.0x1.2x2.9 cm is likely in contiguity with the dominant mass at 3 o'clock and is also highly suspicious. With the dominant lesion the overall span a disease is approximately 6 cm.   4. Left breast distortion at 2 o'clock 10 cm from the nipple is Suspicious, measuring 1.0 x 0.8 cm.   5.  Abnormal left axillary lymph nodes (2). Cortical thickness measures up to 0.6 cm.    10/28/2019 Initial Biopsy   Diagnosis 1. Breast,  left, needle core biopsy, 3 o'clock, 5cmfn - INVASIVE MAMMARY CARCINOMA. 2. Breast, left, needle core biopsy, 2 o'clock, 10cmfn - INVASIVE MAMMARY CARCINOMA. 3. 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 Microscopic Comment 1. -3. Overall, immunohistochemistry favors a pronounced desmoplastic response, but metaplastic carcinoma cannot be excluded (Epithelioid component: CKAE1AE3 strong +, CK5/6 weak +). The greatest linear extent of tumor in any one core in specimen 1 is 14 mm. The greatest linear extent of tumor in any one core in specimen 2 is 16 mm.   10/28/2019 Receptors her2   3. PROGNOSTIC INDICATORS Results: IMMUNOHISTOCHEMICAL AND MORPHOMETRIC ANALYSIS PERFORMED MANUALLY The tumor cells are EQUIVOCAL for Her2 (2+). Her2 by FISH will be performed and results reported separately. Estrogen Receptor: 0%, NEGATIVE Progesterone Receptor: 0%, NEGATIVE Proliferation Marker Ki67: 10%    3. FLUORESCENCE IN-SITU HYBRIDIZATION Results: GROUP 5: HER2 **NEGATIVE** Equivocal form of amplification of the HER2 gene was detected in the IHC 2+ tissue sample received from this individual. HER2 FISH was performed by a technologist and cell imaging and analysis on the [employer].   10/28/2019 Cancer Staging   Staging form: Breast, AJCC 8th Edition - Clinical stage from 10/28/2019: Stage IIIB (cT2, cN1, cM0, G3, ER-, PR-, HER2-) - Signed by Lonie Peak, MD on 11/05/2019   10/31/2019 Initial Diagnosis   Malignant neoplasm of upper-outer quadrant of left breast in female, estrogen receptor negative (HCC)   10/31/2019 Pathology Results   Diagnosis Breast, right, needle core biopsy, UOQ, posterior - FOCAL USUAL DUCTAL HYPERPLASIA AND FIBROCYSTIC CHANGES WITH CALCIFICATIONS - FIBROADENOMATOID CHANGES - NO MALIGNANCY IDENTIFIED  Microscopic Comment These results were called to [facility] on November 03, 2019.   11/05/2019 Genetic  Testing   She declined Genetic testing    11/21/2019 Imaging   CT CAP w contrast  IMPRESSION: 1. Left breast mass and prominent left axillary lymph nodes, containing biopsy marking clips, consistent with newly diagnosed breast malignancy. 2. No definite evidence of distant metastatic disease in the chest, abdomen, or pelvis. 3. There are occasional small pulmonary nodules, measuring 2-3 mm, most likely incidental sequelae of prior infection or inflammation although metastatic disease is not excluded. Attention on follow-up. 4. Hepatic steatosis. 5. Aortic Atherosclerosis (ICD10-I70.0).     11/21/2019 Imaging   Bone Scan Whole Body IMPRESSION: 1. Single focus of uptake associated with a subacute fracture of LEFT anterior fourth rib. 2. No additional areas of abnormal uptake.   12/26/2019 Cancer Staging   Staging form: Breast, AJCC 8th Edition - Pathologic stage from 12/26/2019: Stage IIIC (pT3, pN3a, cM0, G3, ER-, PR-, HER2-) - Signed by Malachy Mood, MD on 01/28/2020   12/26/2019 Surgery   LEFT MASTECTOMY MODIFIED RADICAL and PAC Placement by Dr Carolynne Edouard   12/26/2019 Pathology Results   FINAL MICROSCOPIC DIAGNOSIS:   A. 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  - Ductal carcinoma in situ, high nuclear grade with central necrosis.  - Margins of resection:  - Metaplastic carcinoma focally involves the anterior margin and is < 1  mm from the posterior margin.  - DCIS is < 1 mm from the anterior margin.  - Metastatic carcinoma in (13) of (16) lymph nodes with extranodal  extension.  - Biopsy clip sites in breast and one lymph node.  - See oncology table.    ADDENDUM:  PROGNOSTIC INDICATOR RESULTS:  Immunohistochemical and morphometric analysis performed manually  The tumor cells are EQUIVOCAL for Her2 (2+). Her2 FISH has been ordered  and will be reported in an addendum.  Estrogen  Receptor:       NEGATIVE  Progesterone Receptor:   NEGATIVE  Proliferation Marker Ki-67:   30%    ADDENDUM:  FLOURESCENCE IN-SITU HYBRIDIZATION RESULTS:  GROUP 5:   HER2 NEGATIVE   01/28/2020 Echocardiogram   Baseline Echo  IMPRESSIONS     1. Left ventricular ejection fraction, by estimation, is 60 to 65%. The  left ventricle has normal function. The left ventricle has no regional  wall motion abnormalities. Left ventricular diastolic parameters are  consistent with Grade I diastolic  dysfunction (impaired relaxation). The average left ventricular global  longitudinal strain is -19.8 %.   2. Right ventricular systolic function is normal. The right ventricular  size is normal. Tricuspid regurgitation signal is inadequate for assessing  PA pressure.   3. The mitral valve is normal in structure. No evidence of mitral valve  regurgitation. No evidence of mitral stenosis.   4. The aortic valve is normal in structure. Aortic valve regurgitation is  not visualized. No aortic stenosis is present.   5. The inferior vena cava is normal in size with greater than 50%  respiratory variability, suggesting right atrial pressure of 3 mmHg.    02/05/2020 -  Chemotherapy   Keytruda q3weeks (to be taken for 1 whole year) with weekly Carboplatin/Taxol for 12 weeks starting 02/05/20-05/13/20 followed by Adriamycin/Cytoxan q2weeks X4 starting 05/20/20   06/17/2020 Breast US   FINDINGS: On physical exam,well-healed scars of LEFT mastectomy and LEFT axillary node dissection.  I palpate no axillary mass. Ultrasound is performed, showing normal appearing LEFT axillary contents. No mass or enlarged lymph nodes. IMPRESSION: Ultrasound is negative for LEFT axillary adenopathy.     CURRENT THERAPY:  Keytrudaq3weeks(to be taken for 1 whole year)with weeklyCarboplatin/Taxolfor 12 weeks 02/05/20-1/6/22followed by Adriamycin/Cytoxanq2weeksX4starting 05/20/20  INTERVAL HISTORY: Ms. Belyeu returns for  follow up and treatment as scheduled. She completed 2 cycles AC and underwent scheduled [place] this morning. She developed severe mouthsores after C2, limited PO and she lost 8 lbs, and almost 20 since starting AC. Magic mouthwash was not helpful, but little better on valtrex and viscous lidocain. Getting better each day. She is tired but functional at home, out of bed and doing ADLs. Otherwise, denies fever, chills, cough, chest pain, dyspnea at rest, leg edema, rash, or other concerns.      MEDICAL HISTORY:  Past Medical History:  Diagnosis Date  . Bronchitis   . Family history of bone cancer   . Family history of breast cancer   . Family history of prostate cancer   . Fibromyalgia   . HCV (hepatitis C virus)   . MRSA (methicillin resistant Staphylococcus aureus) 2012    SURGICAL HISTORY: Past Surgical History:  Procedure Laterality Date  . MASTECTOMY MODIFIED RADICAL Left 12/26/2019   Procedure: LEFT MASTECTOMY MODIFIED RADICAL;  Surgeon: Griselda Miner, MD;  Location: [facility];  Service: General;  Laterality: Left;  PEC BLOCK  . PORTACATH PLACEMENT Right 12/26/2019   Procedure: INSERTION PORT-A-CATH WITH ULTRASOUND GUIDANCE;  Surgeon: Griselda Miner, MD;  Location: [facility] OR;  Service: General;  Laterality: Right;    I have reviewed the social history and family history with the patient and they are unchanged from previous note.  ALLERGIES:  is allergic to codeine.  MEDICATIONS:  Current Outpatient Medications  Medication Sig Dispense Refill  . acetaminophen (TYLENOL) 650 MG CR tablet Take 650 mg by mouth every 8 (eight) hours as needed for pain.     Marland Kitchen amoxicillin-clavulanate (AUGMENTIN) 875-125 MG tablet Take 1 tablet by mouth 2 (two) times daily. 14 tablet 0  . ascorbic acid (VITAMIN C) 250 MG CHEW Chew by mouth.    . diphenoxylate-atropine (LOMOTIL) 2.5-0.025 MG tablet Take 1-2 tablets by mouth 4 (four) times daily as needed for diarrhea or loose stools. 30 tablet 1  . gabapentin  (NEURONTIN) 300 MG capsule Take 300 mg by mouth 3 (three) times daily as needed.    . lidocaine (XYLOCAINE) 2 % solution Use as directed 15 mLs in the mouth or throat every 4 (four) hours as needed for mouth pain. 100 mL 2  . lidocaine-prilocaine (EMLA) cream Apply 1 application topically as needed. 30 g 2  . methocarbamol (ROBAXIN) 750 MG tablet Take 1 tablet (750 mg total) by mouth 4 (four) times daily as needed (use for muscle cramps/pain). 30 tablet 2  . omeprazole (PRILOSEC) 20 MG capsule Take 1 capsule (20 mg total) by mouth daily. 30 capsule 2  . ondansetron (ZOFRAN) 8 MG tablet Take 1 tablet (8 mg total) by mouth 2 (two) times daily as needed for refractory nausea / vomiting. Start on day 3 after carboplatin chemo. 30 tablet 1  . prochlorperazine (COMPAZINE) 10 MG tablet Take 1 tablet (10 mg total) by mouth every 6 (six) hours as needed (Nausea or vomiting). 30 tablet 1  . valACYclovir (VALTREX) 1000 MG tablet Take 1 tablet (1,000 mg total) by mouth 2 (two) times daily. 14 tablet 1   No current facility-administered medications  for this visit.    PHYSICAL EXAMINATION: ECOG PERFORMANCE STATUS: 1 - Symptomatic but completely ambulatory  Vitals:   06/17/20 1049  BP: 133/70  Pulse: 78  Resp: 20  Temp: (!) 97 F (36.1 C)  SpO2: 100%   Filed Weights   06/17/20 1049  Weight: 169 lb 9.6 oz (76.9 kg)    GENERAL:alert, no distress and comfortable SKIN: no rash  EYES: sclera clear OROPHARYNX: blisters on upper and lower lip, no thrush, or tongue/buccal ulcers LUNGS: normal breathing effort HEART:  no lower extremity edema NEURO: alert & oriented x 3 with fluent speech, no focal motor deficits PAC without erythema  Breast exam deferred    LABORATORY DATA:  I have reviewed the data as listed CBC Latest Ref Rng & Units 06/17/2020 06/03/2020 05/27/2020  WBC 4.0 - 10.5 K/uL 5.9 5.2 1.6(L)  Hemoglobin 12.0 - 15.0 g/dL 4.0(J) 10.4(L) 10.1(L)  Hematocrit 36.0 - 46.0 % 27.5(L) 29.9(L)  29.3(L)  Platelets 150 - 400 K/uL 285 247 73(L)     CMP Latest Ref Rng & Units 06/17/2020 06/03/2020 05/27/2020  Glucose 70 - 99 mg/dL 811(B) 147(W) 295(A)  BUN 8 - 23 mg/dL <2(Z) 4(L) 8  Creatinine 0.44 - 1.00 mg/dL 3.08 6.57 8.46  Sodium 135 - 145 mmol/L 135 139 136  Potassium 3.5 - 5.1 mmol/L 3.8 3.4(L) 3.8  Chloride 98 - 111 mmol/L 102 105 104  CO2 22 - 32 mmol/L 25 26 25   Calcium 8.9 - 10.3 mg/dL 9.0 9.6(E) 9.5(M)  Total Protein 6.5 - 8.1 g/dL 6.1(L) 5.9(L) 5.8(L)  Total Bilirubin 0.3 - 1.2 mg/dL 0.3 0.3 0.5  Alkaline Phos 38 - 126 U/L 82 82 74  AST 15 - 41 U/L 49(H) 33 21  ALT 0 - 44 U/L 18 11 10       RADIOGRAPHIC STUDIES: I have personally reviewed the radiological images as listed and agreed with the findings in the report. [place] AXILLA LEFT  Result Date: 06/17/2020 CLINICAL DATA:  Patient underwent LEFT mastectomy and axillary node dissection in August of 2021. Final pathology shows metastatic carcinoma in 13 of 16 lymph nodes. Primary tumor was multifocal metaplastic carcinoma, 6 centimeters in greatest diameter. At the time of surgery, 1 positive lymph node could not be removed given invasion of an adjacent vessel. EXAM: ULTRASOUND OF THE LEFT AXILLA COMPARISON:  Previous exam(s). FINDINGS: On physical exam,well-healed scars of LEFT mastectomy and LEFT axillary node dissection. I palpate no axillary mass. Ultrasound is performed, showing normal appearing LEFT axillary contents. No mass or enlarged lymph nodes. IMPRESSION: Ultrasound is negative for LEFT axillary adenopathy. RECOMMENDATION: Treatment plan for known LEFT malignancy. RIGHT screening mammogram is recommended in June of 2022. I have discussed the findings and recommendations with the patient. If applicable, a reminder letter will be sent to the patient regarding the next appointment. BI-RADS CATEGORY  2: Benign. Electronically Signed   By: Norva Pavlov M.D.   On: 06/17/2020 09:46     ASSESSMENT & PLAN: Tattiana Reynosa is a 65 y.o. female with   1.Left breast Multifocal Metaplastic cancer,pT3N3aM0,including one unresectable axillary node, metaplastic carcinoma,ER-/PR-/HER2-, grade IIIC -She was diagnosed in 10/2019 with left breastmetaplasticcancer, triple negative disease metastatic to left axillary LN. Her 11/2019 CT CAP and bone scan was negative for distant metastasis.  -S/p left mastectomy with Dr Carolynne Edouard on 12/26/19.Surgical pathshowed6cm multifocal metaplastic carcinomaand13/16 positive LN but unfortunately 1 positive LN was not able to be removed due to vascular invasion. -She [facility]  be taken for 1 whole year)withweekly Carboplatin/Taxolfor 12 weeksbeginning9/30/21- 1/6/22followed by [facility] q2 weeks x4 starting 05/20/20. -s/p cycle 2 AC she has fatigue, mucositis, and weight loss -interim left axillary [place] negative for adenopathy, c/w response to adjuvant chemo -plan to proceed with radiation after adjuvant chemo  2. G3 mucositis and weight loss -she developed mucositis after cycle 2 AC and lost 8 lbs. However, she has lost close to 20 lbs on [facility] and 30 lbs since starting chemo in 01/2020 -encouraged her to increase supplements, eat q2 hours, and hydrate   3. Hepatitis C,untreated,diagnosed in 1992 -pending referral to ID or [facility] for treatment after she completed chemo. -monitoring LFTs and for possible hep C flare while on chemo/immunotherapy  4. Social and financial support  -She has no children and no close relatives. She does have close friends and church community who are supportive and strong faith.  -She did not have medical insurance for many years. She recently applied for blue cross and blue shield which is not effective yet.  5. Mild anemia -Secondary to chemotherapy, mild, will continue monitoring. -Stable   Disposition:  Ms. Hollimon appears stable. She is s/p cycle 2 AC and continues q3 weeks [place]. She  tolerates treatment moderately well with fatigue, mucositis, and weight loss. She is otherwise able to recover and function well.   I personally reviewed the interim left axillary [place] which is negative for adenopathy. We discussed imaging limitations and the possibility there may be residual microscopic tumor cells, but overall this is c/w response to adjuvant chemotherapy.   Labs reviewed. Due to mucositis and weight loss, we will proceed with pembrolizumab only today, and postpone cycle 3 AC next week if she recovers well. She will continue supportive care at home and focus on nutrition/gaining weight.   F/up in 3 weeks with cycle 4 AC and pembro. After final cycle chemo she will proceed with radiation and continue q3 weeks pembro.   Ms. Tait was seen with Dr. Mosetta Putt.   All questions were answered. The patient knows to call the clinic with any problems, questions or concerns. No barriers to learning were detected.     Pollyann Samples, NP 06/17/20   Addendum  I have seen the patient, examined her. I agree with the assessment and and plan and have edited the notes.   Ms Guia has not recovered well from cycle 2 AC, with residual mucositis, and 20 lbs weight loss since 4 weeks ago. Lab reviewed. I recommend postpone cycle 3 AC for a week, and proceed with Keytruda today. She agrees with the plan, symptom management discussed with her.   Malachy Mood  06/17/2020

## 2020-06-17 ENCOUNTER — Ambulatory Visit
Admission: RE | Admit: 2020-06-17 | Discharge: 2020-06-17 | Disposition: A | Discharge status: Home / Self Care | Payer: Medicare Other | Source: Ambulatory Visit | Attending: Hematology | Admitting: Hematology

## 2020-06-17 ENCOUNTER — Inpatient Hospital Stay: Payer: Medicare Other | Attending: Hematology

## 2020-06-17 ENCOUNTER — Inpatient Hospital Stay: Payer: Medicare Other

## 2020-06-17 ENCOUNTER — Other Ambulatory Visit: Payer: Self-pay | Admitting: Hematology

## 2020-06-17 ENCOUNTER — Inpatient Hospital Stay (HOSPITAL_BASED_OUTPATIENT_CLINIC_OR_DEPARTMENT_OTHER): Payer: Medicare Other | Admitting: Nurse Practitioner

## 2020-06-17 ENCOUNTER — Other Ambulatory Visit: Payer: Self-pay

## 2020-06-17 ENCOUNTER — Encounter: Payer: Self-pay | Admitting: Nurse Practitioner

## 2020-06-17 VITALS — BP 133/70 | HR 78 | Temp 97.0°F | Resp 20 | Ht 61.0 in | Wt 169.6 lb

## 2020-06-17 DIAGNOSIS — K1231 Oral mucositis (ulcerative) due to antineoplastic therapy: Secondary | ICD-10-CM | POA: Diagnosis not present

## 2020-06-17 DIAGNOSIS — R634 Abnormal weight loss: Secondary | ICD-10-CM | POA: Diagnosis not present

## 2020-06-17 DIAGNOSIS — Z171 Estrogen receptor negative status [ER-]: Secondary | ICD-10-CM

## 2020-06-17 DIAGNOSIS — Z5111 Encounter for antineoplastic chemotherapy: Secondary | ICD-10-CM | POA: Insufficient documentation

## 2020-06-17 DIAGNOSIS — Z5112 Encounter for antineoplastic immunotherapy: Secondary | ICD-10-CM | POA: Insufficient documentation

## 2020-06-17 DIAGNOSIS — C50412 Malignant neoplasm of upper-outer quadrant of left female breast: Secondary | ICD-10-CM | POA: Diagnosis present

## 2020-06-17 DIAGNOSIS — B1920 Unspecified viral hepatitis C without hepatic coma: Secondary | ICD-10-CM | POA: Insufficient documentation

## 2020-06-17 DIAGNOSIS — D6481 Anemia due to antineoplastic chemotherapy: Secondary | ICD-10-CM | POA: Insufficient documentation

## 2020-06-17 LAB — CMP (CANCER CENTER ONLY)
ALT: 18 U/L (ref 0–44)
AST: 49 U/L — ABNORMAL HIGH (ref 15–41)
Albumin: 3.9 g/dL (ref 3.5–5.0)
Alkaline Phosphatase: 82 U/L (ref 38–126)
Anion gap: 8 (ref 5–15)
BUN: <4 mg/dL — ABNORMAL LOW (ref 8–23)
CO2: 25 mmol/L (ref 22–32)
Calcium: 9 mg/dL (ref 8.9–10.3)
Chloride: 102 mmol/L (ref 98–111)
Creatinine: 0.66 mg/dL (ref 0.44–1.00)
GFR, Estimated: >60 mL/min
Glucose, Bld: 104 mg/dL — ABNORMAL HIGH (ref 70–99)
Potassium: 3.8 mmol/L (ref 3.5–5.1)
Sodium: 135 mmol/L (ref 135–145)
Total Bilirubin: 0.3 mg/dL (ref 0.3–1.2)
Total Protein: 6.1 g/dL — ABNORMAL LOW (ref 6.5–8.1)

## 2020-06-17 LAB — CBC WITH DIFFERENTIAL (CANCER CENTER ONLY)
Abs Immature Granulocytes: 0.12 10*3/uL — ABNORMAL HIGH (ref 0.00–0.07)
Basophils Absolute: 0.1 10*3/uL (ref 0.0–0.1)
Basophils Relative: 1 %
Eosinophils Absolute: 0.1 10*3/uL (ref 0.0–0.5)
Eosinophils Relative: 1 %
HCT: 27.5 % — ABNORMAL LOW (ref 36.0–46.0)
Hemoglobin: 9.8 g/dL — ABNORMAL LOW (ref 12.0–15.0)
Immature Granulocytes: 2 %
Lymphocytes Relative: 24 %
Lymphs Abs: 1.4 10*3/uL (ref 0.7–4.0)
MCH: 35 pg — ABNORMAL HIGH (ref 26.0–34.0)
MCHC: 35.6 g/dL (ref 30.0–36.0)
MCV: 98.2 fL (ref 80.0–100.0)
Monocytes Absolute: 1 10*3/uL (ref 0.1–1.0)
Monocytes Relative: 17 %
Neutro Abs: 3.3 10*3/uL (ref 1.7–7.7)
Neutrophils Relative %: 55 %
Platelet Count: 285 10*3/uL (ref 150–400)
RBC: 2.8 MIL/uL — ABNORMAL LOW (ref 3.87–5.11)
RDW: 13 % (ref 11.5–15.5)
WBC Count: 5.9 10*3/uL (ref 4.0–10.5)
nRBC: 0 % (ref 0.0–0.2)

## 2020-06-17 MED ORDER — HEPARIN SOD (PORK) LOCK FLUSH 100 UNIT/ML IV SOLN
500.0000 [IU] | Freq: Once | INTRAVENOUS | Status: AC | PRN
  Administered 2020-06-17: 500 [IU]
  Filled 2020-06-17: qty 5

## 2020-06-17 MED ORDER — SODIUM CHLORIDE 0.9 % IV SOLN
200.0000 mg | Freq: Once | INTRAVENOUS | Status: AC
  Administered 2020-06-17: 200 mg via INTRAVENOUS
  Filled 2020-06-17: qty 8

## 2020-06-17 MED ORDER — SODIUM CHLORIDE 0.9% FLUSH
10.0000 mL | INTRAVENOUS | Status: DC | PRN
  Administered 2020-06-17: 10 mL
  Filled 2020-06-17: qty 10

## 2020-06-17 MED ORDER — SODIUM CHLORIDE 0.9 % IV SOLN
Freq: Once | INTRAVENOUS | Status: AC
  Filled 2020-06-17: qty 250

## 2020-06-17 NOTE — Patient Instructions (Signed)
[facility] Discharge Instructions for Patients Receiving Chemotherapy  Today you received the following chemotherapy agents keytruda  To help prevent nausea and vomiting after your treatment, we encourage you to take your nausea medication as directed If you develop nausea and vomiting that is not controlled by your nausea medication, call the clinic.   BELOW ARE SYMPTOMS THAT SHOULD BE REPORTED IMMEDIATELY:  *FEVER GREATER THAN 100.5 F  *CHILLS WITH OR WITHOUT FEVER  NAUSEA AND VOMITING THAT IS NOT CONTROLLED WITH YOUR NAUSEA MEDICATION  *UNUSUAL SHORTNESS OF BREATH  *UNUSUAL BRUISING OR BLEEDING  TENDERNESS IN MOUTH AND THROAT WITH OR WITHOUT PRESENCE OF ULCERS  *URINARY PROBLEMS  *BOWEL PROBLEMS  UNUSUAL RASH Items with * indicate a potential emergency and should be followed up as soon as possible.  Feel free to call the clinic should you have any questions or concerns. The clinic phone number is [phone].  Please show the CHEMO ALERT CARD at check-in to the Emergency Department and triage nurse.   

## 2020-06-18 ENCOUNTER — Encounter: Payer: Self-pay | Admitting: *Deleted

## 2020-06-18 ENCOUNTER — Ambulatory Visit: Payer: Medicare Other

## 2020-06-19 ENCOUNTER — Ambulatory Visit: Payer: Medicare Other

## 2020-06-25 ENCOUNTER — Other Ambulatory Visit: Payer: Self-pay

## 2020-06-25 ENCOUNTER — Inpatient Hospital Stay: Payer: Medicare Other

## 2020-06-25 VITALS — BP 116/68 | HR 70 | Temp 97.7°F | Resp 18 | Wt 166.8 lb

## 2020-06-25 DIAGNOSIS — Z95828 Presence of other vascular implants and grafts: Secondary | ICD-10-CM

## 2020-06-25 DIAGNOSIS — C50412 Malignant neoplasm of upper-outer quadrant of left female breast: Secondary | ICD-10-CM

## 2020-06-25 DIAGNOSIS — Z5112 Encounter for antineoplastic immunotherapy: Secondary | ICD-10-CM | POA: Diagnosis not present

## 2020-06-25 DIAGNOSIS — Z171 Estrogen receptor negative status [ER-]: Secondary | ICD-10-CM

## 2020-06-25 LAB — CBC WITH DIFFERENTIAL (CANCER CENTER ONLY)
Abs Immature Granulocytes: 0.01 10*3/uL (ref 0.00–0.07)
Basophils Absolute: 0.1 10*3/uL (ref 0.0–0.1)
Basophils Relative: 2 %
Eosinophils Absolute: 0 10*3/uL (ref 0.0–0.5)
Eosinophils Relative: 1 %
HCT: 31.8 % — ABNORMAL LOW (ref 36.0–46.0)
Hemoglobin: 10.8 g/dL — ABNORMAL LOW (ref 12.0–15.0)
Immature Granulocytes: 0 %
Lymphocytes Relative: 26 %
Lymphs Abs: 1.1 10*3/uL (ref 0.7–4.0)
MCH: 34.4 pg — ABNORMAL HIGH (ref 26.0–34.0)
MCHC: 34 g/dL (ref 30.0–36.0)
MCV: 101.3 fL — ABNORMAL HIGH (ref 80.0–100.0)
Monocytes Absolute: 0.7 10*3/uL (ref 0.1–1.0)
Monocytes Relative: 19 %
Neutro Abs: 2.1 10*3/uL (ref 1.7–7.7)
Neutrophils Relative %: 52 %
Platelet Count: 373 10*3/uL (ref 150–400)
RBC: 3.14 MIL/uL — ABNORMAL LOW (ref 3.87–5.11)
RDW: 14.6 % (ref 11.5–15.5)
WBC Count: 4 10*3/uL (ref 4.0–10.5)
nRBC: 0 % (ref 0.0–0.2)

## 2020-06-25 LAB — CMP (CANCER CENTER ONLY)
ALT: 15 U/L (ref 0–44)
AST: 48 U/L — ABNORMAL HIGH (ref 15–41)
Albumin: 3.8 g/dL (ref 3.5–5.0)
Alkaline Phosphatase: 71 U/L (ref 38–126)
Anion gap: 8 (ref 5–15)
BUN: 4 mg/dL — ABNORMAL LOW (ref 8–23)
CO2: 24 mmol/L (ref 22–32)
Calcium: 8.9 mg/dL (ref 8.9–10.3)
Chloride: 104 mmol/L (ref 98–111)
Creatinine: 0.68 mg/dL (ref 0.44–1.00)
GFR, Estimated: >60 mL/min (ref >60)
Glucose, Bld: 105 mg/dL — ABNORMAL HIGH (ref 70–99)
Potassium: 3.7 mmol/L (ref 3.5–5.1)
Sodium: 136 mmol/L (ref 135–145)
Total Bilirubin: 0.4 mg/dL (ref 0.3–1.2)
Total Protein: 6 g/dL — ABNORMAL LOW (ref 6.5–8.1)

## 2020-06-25 MED ORDER — SODIUM CHLORIDE 0.9 % IV SOLN
Freq: Once | INTRAVENOUS | Status: AC
  Filled 2020-06-25: qty 250

## 2020-06-25 MED ORDER — SODIUM CHLORIDE 0.9 % IV SOLN
600.0000 mg/m2 | Freq: Once | INTRAVENOUS | Status: AC
  Administered 2020-06-25: 1160 mg via INTRAVENOUS
  Filled 2020-06-25: qty 58

## 2020-06-25 MED ORDER — METHYLPREDNISOLONE SODIUM SUCC 40 MG IJ SOLR
INTRAMUSCULAR | Status: AC
  Filled 2020-06-25: qty 1

## 2020-06-25 MED ORDER — PALONOSETRON HCL INJECTION 0.25 MG/5ML
0.2500 mg | Freq: Once | INTRAVENOUS | Status: AC
  Administered 2020-06-25: 0.25 mg via INTRAVENOUS

## 2020-06-25 MED ORDER — SODIUM CHLORIDE 0.9 % IV SOLN
150.0000 mg | Freq: Once | INTRAVENOUS | Status: AC
  Administered 2020-06-25: 150 mg via INTRAVENOUS
  Filled 2020-06-25: qty 150
  Filled 2020-06-25: qty 5

## 2020-06-25 MED ORDER — DOXORUBICIN HCL CHEMO IV INJECTION 2 MG/ML
60.0000 mg/m2 | Freq: Once | INTRAVENOUS | Status: AC
  Administered 2020-06-25: 116 mg via INTRAVENOUS
  Filled 2020-06-25: qty 58

## 2020-06-25 MED ORDER — SODIUM CHLORIDE 0.9% FLUSH
10.0000 mL | INTRAVENOUS | Status: DC | PRN
  Administered 2020-06-25: 10 mL
  Filled 2020-06-25: qty 10

## 2020-06-25 MED ORDER — METHYLPREDNISOLONE SODIUM SUCC 40 MG IJ SOLR
10.0000 mg | Freq: Once | INTRAMUSCULAR | Status: AC
Start: 2020-06-25 — End: 2020-06-25
  Administered 2020-06-25: 10 mg via INTRAVENOUS

## 2020-06-25 MED ORDER — HEPARIN SOD (PORK) LOCK FLUSH 100 UNIT/ML IV SOLN
500.0000 [IU] | Freq: Once | INTRAVENOUS | Status: AC | PRN
  Administered 2020-06-25: 500 [IU]
  Filled 2020-06-25: qty 5

## 2020-06-25 MED ORDER — PALONOSETRON HCL INJECTION 0.25 MG/5ML
INTRAVENOUS | Status: AC
  Filled 2020-06-25: qty 5

## 2020-06-25 MED ORDER — SODIUM CHLORIDE 0.9% FLUSH
10.0000 mL | Freq: Once | INTRAVENOUS | Status: AC
  Administered 2020-06-25: 10 mL
  Filled 2020-06-25: qty 10

## 2020-06-25 NOTE — Patient Instructions (Signed)

## 2020-06-25 NOTE — Patient Instructions (Signed)
[facility] Discharge Instructions for Patients Receiving Chemotherapy  Today you received the following chemotherapy agents: doxorubicin/cyclophosphamide.  To help prevent nausea and vomiting after your treatment, we encourage you to take your nausea medication as directed.   If you develop nausea and vomiting that is not controlled by your nausea medication, call the clinic.   BELOW ARE SYMPTOMS THAT SHOULD BE REPORTED IMMEDIATELY:  *FEVER GREATER THAN 100.5 F  *CHILLS WITH OR WITHOUT FEVER  NAUSEA AND VOMITING THAT IS NOT CONTROLLED WITH YOUR NAUSEA MEDICATION  *UNUSUAL SHORTNESS OF BREATH  *UNUSUAL BRUISING OR BLEEDING  TENDERNESS IN MOUTH AND THROAT WITH OR WITHOUT PRESENCE OF ULCERS  *URINARY PROBLEMS  *BOWEL PROBLEMS  UNUSUAL RASH Items with * indicate a potential emergency and should be followed up as soon as possible.  Feel free to call the clinic should you have any questions or concerns. The clinic phone number is [phone].  Please show the CHEMO ALERT CARD at check-in to the Emergency Department and triage nurse.   

## 2020-06-28 ENCOUNTER — Other Ambulatory Visit: Payer: Self-pay | Admitting: Hematology

## 2020-06-29 ENCOUNTER — Other Ambulatory Visit: Payer: Self-pay

## 2020-06-29 ENCOUNTER — Inpatient Hospital Stay: Payer: Medicare Other

## 2020-06-29 VITALS — BP 107/67 | HR 77 | Temp 99.2°F | Resp 18

## 2020-06-29 DIAGNOSIS — C50412 Malignant neoplasm of upper-outer quadrant of left female breast: Secondary | ICD-10-CM

## 2020-06-29 DIAGNOSIS — Z5112 Encounter for antineoplastic immunotherapy: Secondary | ICD-10-CM | POA: Diagnosis not present

## 2020-06-29 MED ORDER — PEGFILGRASTIM-JMDB 6 MG/0.6ML ~~LOC~~ SOSY
6.0000 mg | PREFILLED_SYRINGE | Freq: Once | SUBCUTANEOUS | Status: AC
  Administered 2020-06-29: 6 mg via SUBCUTANEOUS

## 2020-06-29 MED ORDER — PEGFILGRASTIM-JMDB 6 MG/0.6ML ~~LOC~~ SOSY
PREFILLED_SYRINGE | SUBCUTANEOUS | Status: AC
  Filled 2020-06-29: qty 0.6

## 2020-06-29 NOTE — Patient Instructions (Signed)
Pegfilgrastim injection What is this medicine? PEGFILGRASTIM (PEG fil gra stim) is a long-acting granulocyte colony-stimulating factor that stimulates the growth of neutrophils, a type of white blood cell important in the body's fight against infection. It is used to reduce the incidence of fever and infection in patients with certain types of cancer who are receiving chemotherapy that affects the bone marrow, and to increase survival after being exposed to high doses of radiation. This medicine may be used for other purposes; ask your health care provider or pharmacist if you have questions. COMMON BRAND NAME(S): Fulphila, Neulasta, Nyvepria, UDENYCA, Ziextenzo What should I tell my health care provider before I take this medicine? They need to know if you have any of these conditions:  kidney disease  latex allergy  ongoing radiation therapy  sickle cell disease  skin reactions to acrylic adhesives (On-Body Injector only)  an unusual or allergic reaction to pegfilgrastim, filgrastim, other medicines, foods, dyes, or preservatives  pregnant or trying to get pregnant  breast-feeding How should I use this medicine? This medicine is for injection under the skin. If you get this medicine at home, you will be taught how to prepare and give the pre-filled syringe or how to use the On-body Injector. Refer to the patient Instructions for Use for detailed instructions. Use exactly as directed. Tell your healthcare provider immediately if you suspect that the On-body Injector may not have performed as intended or if you suspect the use of the On-body Injector resulted in a missed or partial dose. It is important that you put your used needles and syringes in a special sharps container. Do not put them in a trash can. If you do not have a sharps container, call your pharmacist or healthcare provider to get one. Talk to your pediatrician regarding the use of this medicine in children. While this drug  may be prescribed for selected conditions, precautions do apply. Overdosage: If you think you have taken too much of this medicine contact a poison control center or emergency room at once. NOTE: This medicine is only for you. Do not share this medicine with others. What if I miss a dose? It is important not to miss your dose. Call your doctor or health care professional if you miss your dose. If you miss a dose due to an On-body Injector failure or leakage, a new dose should be administered as soon as possible using a single prefilled syringe for manual use. What may interact with this medicine? Interactions have not been studied. This list may not describe all possible interactions. Give your health care provider a list of all the medicines, herbs, non-prescription drugs, or dietary supplements you use. Also tell them if you smoke, drink alcohol, or use illegal drugs. Some items may interact with your medicine. What should I watch for while using this medicine? Your condition will be monitored carefully while you are receiving this medicine. You may need blood work done while you are taking this medicine. Talk to your health care provider about your risk of cancer. You may be more at risk for certain types of cancer if you take this medicine. If you are going to need a MRI, CT scan, or other procedure, tell your doctor that you are using this medicine (On-Body Injector only). What side effects may I notice from receiving this medicine? Side effects that you should report to your doctor or health care professional as soon as possible:  allergic reactions (skin rash, itching or hives, swelling of   the face, lips, or tongue)  back pain  dizziness  fever  pain, redness, or irritation at site where injected  pinpoint red spots on the skin  red or dark-brown urine  shortness of breath or breathing problems  stomach or side pain, or pain at the shoulder  swelling  tiredness  trouble  passing urine or change in the amount of urine  unusual bruising or bleeding Side effects that usually do not require medical attention (report to your doctor or health care professional if they continue or are bothersome):  bone pain  muscle pain This list may not describe all possible side effects. Call your doctor for medical advice about side effects. You may report side effects to FDA at 1-800-FDA-1088. Where should I keep my medicine? Keep out of the reach of children. If you are using this medicine at home, you will be instructed on how to store it. Throw away any unused medicine after the expiration date on the label. NOTE: This sheet is a summary. It may not cover all possible information. If you have questions about this medicine, talk to your doctor, pharmacist, or health care provider.  2021 Elsevier/Gold Standard (2019-05-16 13:20:51)  

## 2020-06-30 ENCOUNTER — Other Ambulatory Visit: Payer: Self-pay

## 2020-06-30 ENCOUNTER — Inpatient Hospital Stay: Payer: Medicare Other

## 2020-07-01 ENCOUNTER — Ambulatory Visit: Payer: Medicare Other | Admitting: Hematology

## 2020-07-01 ENCOUNTER — Ambulatory Visit: Payer: Medicare Other

## 2020-07-01 ENCOUNTER — Other Ambulatory Visit: Payer: Medicare Other

## 2020-07-03 ENCOUNTER — Ambulatory Visit: Payer: Medicare Other

## 2020-07-08 ENCOUNTER — Inpatient Hospital Stay: Payer: Medicare Other | Admitting: Hematology

## 2020-07-08 ENCOUNTER — Other Ambulatory Visit: Payer: Medicare Other

## 2020-07-08 ENCOUNTER — Other Ambulatory Visit: Payer: Self-pay

## 2020-07-08 ENCOUNTER — Inpatient Hospital Stay: Payer: Medicare Other

## 2020-07-08 ENCOUNTER — Encounter: Payer: Self-pay | Admitting: *Deleted

## 2020-07-08 ENCOUNTER — Telehealth: Payer: Self-pay

## 2020-07-08 ENCOUNTER — Ambulatory Visit: Payer: Medicare Other

## 2020-07-08 DIAGNOSIS — K1230 Oral mucositis (ulcerative), unspecified: Secondary | ICD-10-CM

## 2020-07-08 MED ORDER — DEXAMETHASONE 0.5 MG/5ML PO SOLN: ORAL | 3 refills | Status: DC

## 2020-07-08 NOTE — Telephone Encounter (Signed)
Lindsay [name] called she is still having issues with mouth sores.  She has several in her mouth and her mouth is tingling.  Reviewed with Dr Burr Medico.  Decadron rinse rx sent to her pharmacy.  Will as for PA for Gelclair mouth rinse.  Lindsay [name] verbalized understanding

## 2020-07-10 ENCOUNTER — Ambulatory Visit: Payer: Medicare Other

## 2020-07-12 ENCOUNTER — Other Ambulatory Visit: Payer: Self-pay

## 2020-07-12 DIAGNOSIS — K1230 Oral mucositis (ulcerative), unspecified: Secondary | ICD-10-CM

## 2020-07-12 MED ORDER — GELCLAIR MT GEL: 1.0000 | Freq: Two times a day (BID) | OROMUCOSAL | 1 refills | Status: DC | PRN

## 2020-07-13 ENCOUNTER — Inpatient Hospital Stay: Payer: Medicare Other | Attending: Hematology

## 2020-07-13 ENCOUNTER — Other Ambulatory Visit: Payer: Medicare Other

## 2020-07-13 ENCOUNTER — Inpatient Hospital Stay: Payer: Medicare Other

## 2020-07-13 ENCOUNTER — Other Ambulatory Visit: Payer: Self-pay

## 2020-07-13 ENCOUNTER — Inpatient Hospital Stay (HOSPITAL_BASED_OUTPATIENT_CLINIC_OR_DEPARTMENT_OTHER): Payer: Medicare Other | Admitting: Nurse Practitioner

## 2020-07-13 ENCOUNTER — Encounter: Payer: Self-pay | Admitting: *Deleted

## 2020-07-13 ENCOUNTER — Ambulatory Visit: Payer: Medicare Other | Admitting: Nurse Practitioner

## 2020-07-13 ENCOUNTER — Encounter: Payer: Self-pay | Admitting: Nurse Practitioner

## 2020-07-13 VITALS — BP 115/67 | HR 79 | Temp 97.7°F | Resp 14 | Ht 61.0 in | Wt 160.5 lb

## 2020-07-13 DIAGNOSIS — Z5189 Encounter for other specified aftercare: Secondary | ICD-10-CM | POA: Insufficient documentation

## 2020-07-13 DIAGNOSIS — R600 Localized edema: Secondary | ICD-10-CM | POA: Diagnosis not present

## 2020-07-13 DIAGNOSIS — Z171 Estrogen receptor negative status [ER-]: Secondary | ICD-10-CM | POA: Insufficient documentation

## 2020-07-13 DIAGNOSIS — B1920 Unspecified viral hepatitis C without hepatic coma: Secondary | ICD-10-CM | POA: Diagnosis not present

## 2020-07-13 DIAGNOSIS — D6481 Anemia due to antineoplastic chemotherapy: Secondary | ICD-10-CM | POA: Diagnosis not present

## 2020-07-13 DIAGNOSIS — Z5112 Encounter for antineoplastic immunotherapy: Secondary | ICD-10-CM | POA: Insufficient documentation

## 2020-07-13 DIAGNOSIS — C50412 Malignant neoplasm of upper-outer quadrant of left female breast: Secondary | ICD-10-CM

## 2020-07-13 DIAGNOSIS — C773 Secondary and unspecified malignant neoplasm of axilla and upper limb lymph nodes: Secondary | ICD-10-CM | POA: Insufficient documentation

## 2020-07-13 DIAGNOSIS — K1231 Oral mucositis (ulcerative) due to antineoplastic therapy: Secondary | ICD-10-CM | POA: Insufficient documentation

## 2020-07-13 DIAGNOSIS — Z5111 Encounter for antineoplastic chemotherapy: Secondary | ICD-10-CM | POA: Diagnosis present

## 2020-07-13 LAB — CMP (CANCER CENTER ONLY)
ALT: 13 U/L (ref 0–44)
AST: 43 U/L — ABNORMAL HIGH (ref 15–41)
Albumin: 3.5 g/dL (ref 3.5–5.0)
Alkaline Phosphatase: 71 U/L (ref 38–126)
Anion gap: 7 (ref 5–15)
BUN: 4 mg/dL — ABNORMAL LOW (ref 8–23)
CO2: 26 mmol/L (ref 22–32)
Calcium: 8.5 mg/dL — ABNORMAL LOW (ref 8.9–10.3)
Chloride: 105 mmol/L (ref 98–111)
Creatinine: 0.6 mg/dL (ref 0.44–1.00)
GFR, Estimated: >60 mL/min (ref >60)
Glucose, Bld: 97 mg/dL (ref 70–99)
Potassium: 3.6 mmol/L (ref 3.5–5.1)
Sodium: 138 mmol/L (ref 135–145)
Total Bilirubin: 0.4 mg/dL (ref 0.3–1.2)
Total Protein: 5.4 g/dL — ABNORMAL LOW (ref 6.5–8.1)

## 2020-07-13 LAB — CBC WITH DIFFERENTIAL (CANCER CENTER ONLY)
Abs Immature Granulocytes: 0.05 10*3/uL (ref 0.00–0.07)
Basophils Absolute: 0.1 10*3/uL (ref 0.0–0.1)
Basophils Relative: 2 %
Eosinophils Absolute: 0.1 10*3/uL (ref 0.0–0.5)
Eosinophils Relative: 2 %
HCT: 30.3 % — ABNORMAL LOW (ref 36.0–46.0)
Hemoglobin: 10.3 g/dL — ABNORMAL LOW (ref 12.0–15.0)
Immature Granulocytes: 1 %
Lymphocytes Relative: 21 %
Lymphs Abs: 1 10*3/uL (ref 0.7–4.0)
MCH: 35.2 pg — ABNORMAL HIGH (ref 26.0–34.0)
MCHC: 34 g/dL (ref 30.0–36.0)
MCV: 103.4 fL — ABNORMAL HIGH (ref 80.0–100.0)
Monocytes Absolute: 0.7 10*3/uL (ref 0.1–1.0)
Monocytes Relative: 15 %
Neutro Abs: 2.8 10*3/uL (ref 1.7–7.7)
Neutrophils Relative %: 59 %
Platelet Count: 324 10*3/uL (ref 150–400)
RBC: 2.93 MIL/uL — ABNORMAL LOW (ref 3.87–5.11)
RDW: 14.6 % (ref 11.5–15.5)
WBC Count: 4.7 10*3/uL (ref 4.0–10.5)
nRBC: 0 % (ref 0.0–0.2)

## 2020-07-13 MED ORDER — DOXORUBICIN HCL CHEMO IV INJECTION 2 MG/ML
60.0000 mg/m2 | Freq: Once | INTRAVENOUS | Status: AC
  Administered 2020-07-13: 116 mg via INTRAVENOUS
  Filled 2020-07-13: qty 58

## 2020-07-13 MED ORDER — SODIUM CHLORIDE 0.9 % IV SOLN
600.0000 mg/m2 | Freq: Once | INTRAVENOUS | Status: AC
  Administered 2020-07-13: 1160 mg via INTRAVENOUS
  Filled 2020-07-13: qty 58

## 2020-07-13 MED ORDER — METHYLPREDNISOLONE SODIUM SUCC 40 MG IJ SOLR
10.0000 mg | Freq: Once | INTRAMUSCULAR | Status: AC
  Administered 2020-07-13: 10 mg via INTRAVENOUS

## 2020-07-13 MED ORDER — SODIUM CHLORIDE 0.9 % IV SOLN
Freq: Once | INTRAVENOUS | Status: AC
  Filled 2020-07-13: qty 250

## 2020-07-13 MED ORDER — PALONOSETRON HCL INJECTION 0.25 MG/5ML
INTRAVENOUS | Status: AC
  Filled 2020-07-13: qty 5

## 2020-07-13 MED ORDER — SODIUM CHLORIDE 0.9% FLUSH
10.0000 mL | INTRAVENOUS | Status: DC | PRN
  Administered 2020-07-13: 10 mL
  Filled 2020-07-13: qty 10

## 2020-07-13 MED ORDER — SODIUM CHLORIDE 0.9 % IV SOLN
150.0000 mg | Freq: Once | INTRAVENOUS | Status: AC
  Administered 2020-07-13: 150 mg via INTRAVENOUS
  Filled 2020-07-13: qty 150

## 2020-07-13 MED ORDER — HEPARIN SOD (PORK) LOCK FLUSH 100 UNIT/ML IV SOLN
500.0000 [IU] | Freq: Once | INTRAVENOUS | Status: AC | PRN
Start: 2020-07-13 — End: 2020-07-13
  Administered 2020-07-13: 500 [IU]
  Filled 2020-07-13: qty 5

## 2020-07-13 MED ORDER — PALONOSETRON HCL INJECTION 0.25 MG/5ML
0.2500 mg | Freq: Once | INTRAVENOUS | Status: AC
  Administered 2020-07-13: 0.25 mg via INTRAVENOUS

## 2020-07-13 MED ORDER — SODIUM CHLORIDE 0.9 % IV SOLN
200.0000 mg | Freq: Once | INTRAVENOUS | Status: AC
  Administered 2020-07-13: 200 mg via INTRAVENOUS
  Filled 2020-07-13: qty 8

## 2020-07-13 MED ORDER — METHYLPREDNISOLONE SODIUM SUCC 40 MG IJ SOLR
INTRAMUSCULAR | Status: AC
  Filled 2020-07-13: qty 1

## 2020-07-13 NOTE — Progress Notes (Signed)
[place]   [phone] [phone]   Clinic Follow up Note   Patient Care Team: Scotty Court, DO as PCP - General Mauro Kaufmann, RN as Oncology Nurse Navigator Rockwell Germany, RN as Oncology Nurse Navigator [name], MD as Consulting Physician (General Surgery) Truitt Merle, MD as Consulting Physician (Hematology) Eppie Gibson, MD as Attending Physician (Radiation Oncology) 07/13/2020  CHIEF COMPLAINT: Follow up metaplastic left breast cancer   SUMMARY OF ONCOLOGIC HISTORY: Oncology History Overview Note  Cancer Staging Malignant neoplasm of upper-outer quadrant of left breast in female, estrogen receptor negative ([place]) Staging form: Breast, AJCC 8th Edition - Clinical stage from 10/28/2019: Stage IIIB (cT2, cN1, cM0, G3, ER-, PR-, HER2-) - Signed by Eppie Gibson, MD on 11/05/2019 - Pathologic stage from 12/26/2019: Stage IIIC (pT3, pN3a, cM0, G3, ER-, PR-, HER2-) - Signed by Truitt Merle, MD on 01/28/2020    Malignant neoplasm of upper-outer quadrant of left breast in female, estrogen receptor negative ([place])  10/27/2019 Mammogram   IMPRESSION: 1. Right breast calcifications spanning 2.6 cm in the upper slightly medial breast are indeterminate.   2. Left breast dominant mass at 3 o'clock, 6 cm from nipple measuring 3x3.9x2.9 cm is highly suspicious. There is overlying skin thickening, skin involvement is not excluded.   3. Left breast mass/distorted tissue at 1 o'clock, 4cm from nipple measuring 3.0x1.2x2.9 cm is likely in contiguity with the dominant mass at 3 o'clock and is also highly suspicious. With the dominant lesion the overall span a disease is approximately 6 cm.   4. Left breast distortion at 2 o'clock 10 cm from the nipple is Suspicious, measuring 1.0 x 0.8 cm.   5.  Abnormal left axillary lymph nodes (2). Cortical thickness measures up to 0.6 cm.    10/28/2019 Initial Biopsy   Diagnosis 1. Breast, left, needle core  biopsy, 3 o'clock, 5cmfn - INVASIVE MAMMARY CARCINOMA. 2. Breast, left, needle core biopsy, 2 o'clock, 10cmfn - INVASIVE MAMMARY CARCINOMA. 3. 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 Microscopic Comment 1. -3. Overall, immunohistochemistry favors a pronounced desmoplastic response, but metaplastic carcinoma cannot be excluded (Epithelioid component: CKAE1AE3 strong +, CK5/6 weak +). The greatest linear extent of tumor in any one core in specimen 1 is 14 mm. The greatest linear extent of tumor in any one core in specimen 2 is 16 mm.   10/28/2019 Receptors her2   3. PROGNOSTIC INDICATORS Results: IMMUNOHISTOCHEMICAL AND MORPHOMETRIC ANALYSIS PERFORMED MANUALLY The tumor cells are EQUIVOCAL for Her2 (2+). Her2 by FISH will be performed and results reported separately. Estrogen Receptor: 0%, NEGATIVE Progesterone Receptor: 0%, NEGATIVE Proliferation Marker Ki67: 10%    3. FLUORESCENCE IN-SITU HYBRIDIZATION Results: GROUP 5: HER2 **NEGATIVE** Equivocal form of amplification of the HER2 gene was detected in the IHC 2+ tissue sample received from this individual. HER2 FISH was performed by a technologist and cell imaging and analysis on the [employer].   10/28/2019 Cancer Staging   Staging form: Breast, AJCC 8th Edition - Clinical stage from 10/28/2019: Stage IIIB (cT2, cN1, cM0, G3, ER-, PR-, HER2-) - Signed by Eppie Gibson, MD on 11/05/2019   10/31/2019 Initial Diagnosis   Malignant neoplasm of upper-outer quadrant of left breast in female, estrogen receptor negative ([place])   10/31/2019 Pathology Results   Diagnosis Breast, right, needle core biopsy, UOQ, posterior - FOCAL USUAL DUCTAL HYPERPLASIA AND FIBROCYSTIC CHANGES WITH CALCIFICATIONS - FIBROADENOMATOID CHANGES - NO MALIGNANCY IDENTIFIED Microscopic Comment  These results were called to [facility] on November 03, 2019.   11/05/2019 Genetic Testing   She declined  Genetic testing    11/21/2019 Imaging   CT CAP w contrast  IMPRESSION: 1. Left breast mass and prominent left axillary lymph nodes, containing biopsy marking clips, consistent with newly diagnosed breast malignancy. 2. No definite evidence of distant metastatic disease in the chest, abdomen, or pelvis. 3. There are occasional small pulmonary nodules, measuring 2-3 mm, most likely incidental sequelae of prior infection or inflammation although metastatic disease is not excluded. Attention on follow-up. 4. Hepatic steatosis. 5. Aortic Atherosclerosis (ICD10-I70.0).     11/21/2019 Imaging   Bone Scan Whole Body IMPRESSION: 1. Single focus of uptake associated with a subacute fracture of LEFT anterior fourth rib. 2. No additional areas of abnormal uptake.   12/26/2019 Cancer Staging   Staging form: Breast, AJCC 8th Edition - Pathologic stage from 12/26/2019: Stage IIIC (pT3, pN3a, cM0, G3, ER-, PR-, HER2-) - Signed by Truitt Merle, MD on 01/28/2020   12/26/2019 Surgery   LEFT MASTECTOMY MODIFIED RADICAL and PAC Placement by Dr Marlou Starks   12/26/2019 Pathology Results   FINAL MICROSCOPIC DIAGNOSIS:   A. 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  - Ductal carcinoma in situ, high nuclear grade with central necrosis.  - Margins of resection:  - Metaplastic carcinoma focally involves the anterior margin and is < 1  mm from the posterior margin.  - DCIS is < 1 mm from the anterior margin.  - Metastatic carcinoma in (13) of (16) lymph nodes with extranodal  extension.  - Biopsy clip sites in breast and one lymph node.  - See oncology table.    ADDENDUM:  PROGNOSTIC INDICATOR RESULTS:  Immunohistochemical and morphometric analysis performed manually  The tumor cells are EQUIVOCAL for Her2 (2+). Her2 FISH has been ordered  and will be reported in an addendum.  Estrogen Receptor:       NEGATIVE   Progesterone Receptor:   NEGATIVE  Proliferation Marker Ki-67:   30%    ADDENDUM:  FLOURESCENCE IN-SITU HYBRIDIZATION RESULTS:  GROUP 5:   HER2 NEGATIVE   01/28/2020 Echocardiogram   Baseline Echo  IMPRESSIONS     1. Left ventricular ejection fraction, by estimation, is 60 to 65%. The  left ventricle has normal function. The left ventricle has no regional  wall motion abnormalities. Left ventricular diastolic parameters are  consistent with Grade I diastolic  dysfunction (impaired relaxation). The average left ventricular global  longitudinal strain is -19.8 %.   2. Right ventricular systolic function is normal. The right ventricular  size is normal. Tricuspid regurgitation signal is inadequate for assessing  PA pressure.   3. The mitral valve is normal in structure. No evidence of mitral valve  regurgitation. No evidence of mitral stenosis.   4. The aortic valve is normal in structure. Aortic valve regurgitation is  not visualized. No aortic stenosis is present.   5. The inferior vena cava is normal in size with greater than 50%  respiratory variability, suggesting right atrial pressure of 3 mmHg.    02/05/2020 -  Chemotherapy   Keytruda q3weeks (to be taken for 1 whole year) with weekly Carboplatin/Taxol for 12 weeks starting 02/05/20-05/13/20 followed by Adriamycin/Cytoxan q2weeks X4 starting 05/20/20   06/17/2020 Breast US   FINDINGS: On physical exam,well-healed scars of LEFT mastectomy and LEFT axillary node dissection. I palpate  no axillary mass. Ultrasound is performed, showing normal appearing LEFT axillary contents. No mass or enlarged lymph nodes. IMPRESSION: Ultrasound is negative for LEFT axillary adenopathy.     CURRENT THERAPY: Adjuvant chemo weekly carbo/taxol x12 weeks 02/05/20 - 05/13/20 followed by Charna Archer q2 weeks x4 05/20/20 - 07/13/20 plus pembrolizumab q3 weeks to complete 1 year  INTERVAL HISTORY: Ms. Kanan returns for follow up and treatment as scheduled. She  completed cycle 2 AC on 2/18 and continues pembro. Treatment was postponed from 07/08/20 due to receiving Fulphila on 3/22.  She is very fatigued with occasional exertional dyspnea.  She continues to work.  She is losing weight due to taste change and low appetite.  She is doing supplements, shakes, and trying to eat high-calorie foods.  She manages constipation with Benefiber, no nausea/vomiting.  She continues to have mucositis although all sores are gone today.  Small bumps first appeared on her hands then in her mouth.  She is using Dex mouth rinse, will pick up Gelclair from pharmacy.  She has hot flashes and temperature fluctuations, no fever or chills.  She has mild leg edema since chemo, right greater than left with occasional calf cramps.  Her pre-existing neuropathy worsened on chemo especially if she is on her feet more than 2 hours.   MEDICAL HISTORY:  Past Medical History:  Diagnosis Date  . Bronchitis   . Family history of bone cancer   . Family history of breast cancer   . Family history of prostate cancer   . Fibromyalgia   . HCV (hepatitis C virus)   . MRSA (methicillin resistant Staphylococcus aureus) 2012    SURGICAL HISTORY: Past Surgical History:  Procedure Laterality Date  . MASTECTOMY MODIFIED RADICAL Left 12/26/2019   Procedure: LEFT MASTECTOMY MODIFIED RADICAL;  Surgeon: [name], MD;  Location: [facility];  Service: General;  Laterality: Left;  PEC BLOCK  . PORTACATH PLACEMENT Right 12/26/2019   Procedure: INSERTION PORT-A-CATH WITH ULTRASOUND GUIDANCE;  Surgeon: [name], MD;  Location: [facility];  Service: General;  Laterality: Right;    I have reviewed the social history and family history with the patient and they are unchanged from previous note.  ALLERGIES:  is allergic to codeine.  MEDICATIONS:  Current Outpatient Medications  Medication Sig Dispense Refill  . acetaminophen (TYLENOL) 650 MG CR tablet Take 650 mg by mouth every 8 (eight) hours as needed  for pain.     Marland Kitchen amoxicillin-clavulanate (AUGMENTIN) 875-125 MG tablet Take 1 tablet by mouth 2 (two) times daily. 14 tablet 0  . ascorbic acid (VITAMIN C) 250 MG CHEW Chew by mouth.    . dexamethasone (DECADRON) 0.5 MG/5ML solution Take 10 ml (1 mg) swish for 2 minutes and then spit.  Do this four times daily as needed Do Not eat or drink for 1 hour after. 240 mL 3  . diphenoxylate-atropine (LOMOTIL) 2.5-0.025 MG tablet Take 1-2 tablets by mouth 4 (four) times daily as needed for diarrhea or loose stools. 30 tablet 1  . gabapentin (NEURONTIN) 300 MG capsule Take 300 mg by mouth 3 (three) times daily as needed.    . lidocaine (XYLOCAINE) 2 % solution Use as directed 15 mLs in the mouth or throat every 4 (four) hours as needed for mouth pain. 100 mL 2  . lidocaine-prilocaine (EMLA) cream Apply 1 application topically as needed. 30 g 2  . methocarbamol (ROBAXIN) 750 MG tablet Take 1 tablet (750 mg total) by mouth 4 (four) times daily  as needed (use for muscle cramps/pain). 30 tablet 2  . mucosal barrier oral (GELCLAIR) GEL Take 1 packet by mouth 2 (two) times daily as needed. 15 packet 1  . omeprazole (PRILOSEC) 20 MG capsule Take 1 capsule (20 mg total) by mouth daily. 30 capsule 2  . ondansetron (ZOFRAN) 8 MG tablet Take 1 tablet (8 mg total) by mouth 2 (two) times daily as needed for refractory nausea / vomiting. Start on day 3 after carboplatin chemo. 30 tablet 1  . prochlorperazine (COMPAZINE) 10 MG tablet Take 1 tablet (10 mg total) by mouth every 6 (six) hours as needed (Nausea or vomiting). 30 tablet 1  . valACYclovir (VALTREX) 1000 MG tablet Take 1 tablet (1,000 mg total) by mouth 2 (two) times daily. 14 tablet 1   No current facility-administered medications for this visit.   Facility-Administered Medications Ordered in Other Visits  Medication Dose Route Frequency Provider Last Rate Last Admin  . cyclophosphamide (CYTOXAN) 1,160 mg in sodium chloride 0.9 % 250 mL chemo infusion  600 mg/m2  (Treatment Plan Recorded) Intravenous Once Truitt Merle, MD      . DOXOrubicin (ADRIAMYCIN) chemo injection 116 mg  60 mg/m2 (Treatment Plan Recorded) Intravenous Once Truitt Merle, MD      . fosaprepitant (EMEND) 150 mg in sodium chloride 0.9 % 145 mL IVPB  150 mg Intravenous Once Truitt Merle, MD      . heparin lock flush 100 unit/mL  500 Units Intracatheter Once PRN Truitt Merle, MD      . methylPREDNISolone sodium succinate (SOLU-MEDROL) 40 mg/mL injection 10 mg  10 mg Intravenous Once Truitt Merle, MD      . pembrolizumab [facility]) 200 mg in sodium chloride 0.9 % 50 mL chemo infusion  200 mg Intravenous Once Truitt Merle, MD      . sodium chloride flush (NS) 0.9 % injection 10 mL  10 mL Intracatheter PRN Truitt Merle, MD        PHYSICAL EXAMINATION: ECOG PERFORMANCE STATUS: 1 - Symptomatic but completely ambulatory  Vitals:   07/13/20 1109  BP: 115/67  Pulse: 79  Resp: 14  Temp: 97.7 F (36.5 C)  SpO2: 99%   Filed Weights   07/13/20 1109  Weight: 160 lb 8 oz (72.8 kg)    GENERAL:alert, no distress and comfortable SKIN: Palms without erythema or sores.  No rash  EYES:  sclera clear OROPHARYNX: No thrush or ulcers LUNGS: clear with normal breathing effort HEART: regular rate & rhythm, mild bilateral lower extremity edema, R>L  NEURO: alert & oriented x 3 with fluent speech, no focal motor deficits PAC without erythema  Breast exam deferred   LABORATORY DATA:  I have reviewed the data as listed CBC Latest Ref Rng & Units 07/13/2020 06/25/2020 06/17/2020  WBC 4.0 - 10.5 K/uL 4.7 4.0 5.9  Hemoglobin 12.0 - 15.0 g/dL 10.3(L) 10.8(L) 9.8(L)  Hematocrit 36.0 - 46.0 % 30.3(L) 31.8(L) 27.5(L)  Platelets 150 - 400 K/uL 324 373 285     CMP Latest Ref Rng & Units 07/13/2020 06/25/2020 06/17/2020  Glucose 70 - 99 mg/dL 97 105(H) 104(H)  BUN 8 - 23 mg/dL 4(L) 4(L) <4(L)  Creatinine 0.44 - 1.00 mg/dL 0.60 0.68 0.66  Sodium 135 - 145 mmol/L 138 136 135  Potassium 3.5 - 5.1 mmol/L 3.6 3.7 3.8  Chloride 98  - 111 mmol/L 105 104 102  CO2 22 - 32 mmol/L '26 24 25  ' Calcium 8.9 - 10.3 mg/dL 8.5(L) 8.9 9.0  Total Protein 6.5 -  8.1 g/dL 5.4(L) 6.0(L) 6.1(L)  Total Bilirubin 0.3 - 1.2 mg/dL 0.4 0.4 0.3  Alkaline Phos 38 - 126 U/L 71 71 82  AST 15 - 41 U/L 43(H) 48(H) 49(H)  ALT 0 - 44 U/L '13 15 18      ' RADIOGRAPHIC STUDIES: I have personally reviewed the radiological images as listed and agreed with the findings in the report. No results found.   ASSESSMENT & PLAN: Korie Streat Byrumis a 65 y.o.femalewith   1.Left breast Multifocal Metaplastic cancer,pT3N3aM0,including one unresectable axillary node, metaplastic carcinoma,ER-/PR-/HER2-, grade IIIC -She was diagnosed in 10/2019 with left breastmetaplasticcancer, triple negative disease metastatic to left axillary LN. Her 11/2019 CT CAP and bone scan was negative for distant metastasis.  -S/pleft mastectomy with Dr Marlou Starks on 12/26/19.Surgical pathshowed6cm multifocal metaplastic carcinomaand13/16 positive LN but unfortunately 1 positive LN was not able to be removeddue to vascularinvasion. -She startedadjuvant chemo with Keytrudaq3weeks(to be taken for 1 whole year)withweekly Carboplatin/Taxolfor 12 weeksbeginning9/30/21- 1/6/22followed byAC q2 weeks x4 starting 05/20/20. -interim left axillary [place] 06/17/20 is negative for adenopathy, c/w response to adjuvant chemo -plan to proceed with radiation after adjuvant chemo  2. G3 mucositis and weight loss -she developed mucositis after cycle 2 AC and lost 8 lbs. However, she has lost close to 20 lbs on [facility] and 30 lbs since starting chemo in 01/2020 -encouraged her to increase supplements, eat q2 hours, and hydrate  -declined appetite stimulant today   3. Hepatitis C,untreated,diagnosed in 1992 -pending referralto ID or Dawn Drazek for treatment after she completed chemo. -monitoring LFTs and for possible hep C flare while on chemo/immunotherapy  4. Social and financial  support  -She has no children and no close relatives. She does have close friendsand church communitywho are supportiveand strong faith.  -She did not have medical insurance for many years. She recently applied for blue cross and blue shield which is not effective yet.  5. Mild anemia -Secondary to chemotherapy, mild, will continue monitoring. -Stable   Disposition:  Ms. Narciso appears stable. She completed 12 cycles of adjuvant carbo/taxol and 3 cycles of AC with GCSF, and continues q3 weeks [place]. She is tolerating treatment moderately well, with fatigue, mucositis, and low appetite. Side effects are managed with supportive care at home. She declined appetite stimulant today. We reviewed symptom management. She is able to recover well and continue working.   Exam shows bilateral lower extremity edema R>L with calf cramping. I am referring her for doppler to r/o DVT. She has exertional dyspnea, which is likely multifactorial from chemo, low po intake and inactivity. If doppler is positive will do CTA to r/o PE.   Labs reviewed, adequate to proceed with final AC today and another cycle of pembrolizumab. She will return for Fulphila on 07/15/20. I will refer her back to Dr. Isidore Moos for adjuvant RT. F/up in 3 weeks with another cycle of pembro.   Orders Placed This Encounter  Procedures  . VAS [place] LOWER EXTREMITY VENOUS (DVT)   All questions were answered. The patient knows to call the clinic with any problems, questions or concerns. No barriers to learning were detected. Total encounter time was 30 minutes.      Alla Feeling, NP 07/13/20

## 2020-07-13 NOTE — Patient Instructions (Signed)
[facility] Discharge Instructions for Patients Receiving Chemotherapy  Today you received the following chemotherapy agents: pembrolizumab/doxorubicin/cyclophosphamide.  To help prevent nausea and vomiting after your treatment, we encourage you to take your nausea medication as directed.   If you develop nausea and vomiting that is not controlled by your nausea medication, call the clinic.   BELOW ARE SYMPTOMS THAT SHOULD BE REPORTED IMMEDIATELY:  *FEVER GREATER THAN 100.5 F  *CHILLS WITH OR WITHOUT FEVER  NAUSEA AND VOMITING THAT IS NOT CONTROLLED WITH YOUR NAUSEA MEDICATION  *UNUSUAL SHORTNESS OF BREATH  *UNUSUAL BRUISING OR BLEEDING  TENDERNESS IN MOUTH AND THROAT WITH OR WITHOUT PRESENCE OF ULCERS  *URINARY PROBLEMS  *BOWEL PROBLEMS  UNUSUAL RASH Items with * indicate a potential emergency and should be followed up as soon as possible.  Feel free to call the clinic should you have any questions or concerns. The clinic phone number is [phone].  Please show the CHEMO ALERT CARD at check-in to the Emergency Department and triage nurse.   

## 2020-07-15 ENCOUNTER — Telehealth: Payer: Self-pay | Admitting: Nurse Practitioner

## 2020-07-15 ENCOUNTER — Encounter: Payer: Self-pay | Admitting: Nurse Practitioner

## 2020-07-15 ENCOUNTER — Inpatient Hospital Stay: Payer: Medicare Other

## 2020-07-15 ENCOUNTER — Other Ambulatory Visit: Payer: Self-pay

## 2020-07-15 ENCOUNTER — Ambulatory Visit (HOSPITAL_COMMUNITY)
Admission: RE | Admit: 2020-07-15 | Discharge: 2020-07-15 | Disposition: A | Discharge status: Home / Self Care | Payer: Medicare Other | Source: Ambulatory Visit | Attending: Nurse Practitioner | Admitting: Nurse Practitioner

## 2020-07-15 VITALS — BP 126/86 | HR 90 | Temp 98.5°F | Resp 18

## 2020-07-15 DIAGNOSIS — C50412 Malignant neoplasm of upper-outer quadrant of left female breast: Secondary | ICD-10-CM | POA: Diagnosis not present

## 2020-07-15 DIAGNOSIS — Z171 Estrogen receptor negative status [ER-]: Secondary | ICD-10-CM

## 2020-07-15 DIAGNOSIS — Z95828 Presence of other vascular implants and grafts: Secondary | ICD-10-CM

## 2020-07-15 DIAGNOSIS — Z5112 Encounter for antineoplastic immunotherapy: Secondary | ICD-10-CM | POA: Diagnosis not present

## 2020-07-15 MED ORDER — LORATADINE 10 MG PO TABS
10.0000 mg | ORAL_TABLET | Freq: Every day | ORAL | Status: DC
  Administered 2020-07-15: 10 mg via ORAL

## 2020-07-15 MED ORDER — LORATADINE 10 MG PO TABS
ORAL_TABLET | ORAL | Status: AC
  Filled 2020-07-15: qty 1

## 2020-07-15 MED ORDER — PEGFILGRASTIM-JMDB 6 MG/0.6ML ~~LOC~~ SOSY
PREFILLED_SYRINGE | SUBCUTANEOUS | Status: AC
  Filled 2020-07-15: qty 0.6

## 2020-07-15 MED ORDER — PEGFILGRASTIM-JMDB 6 MG/0.6ML ~~LOC~~ SOSY
6.0000 mg | PREFILLED_SYRINGE | Freq: Once | SUBCUTANEOUS | Status: AC
  Administered 2020-07-15: 6 mg via SUBCUTANEOUS

## 2020-07-15 MED ORDER — FILGRASTIM-AAFI 480 MCG/0.8ML IJ SOSY: 480.0000 ug | PREFILLED_SYRINGE | Freq: Once | INTRAMUSCULAR | Status: DC

## 2020-07-15 NOTE — Patient Instructions (Signed)
Pegfilgrastim injection What is this medicine? PEGFILGRASTIM (PEG fil gra stim) is a long-acting granulocyte colony-stimulating factor that stimulates the growth of neutrophils, a type of white blood cell important in the body's fight against infection. It is used to reduce the incidence of fever and infection in patients with certain types of cancer who are receiving chemotherapy that affects the bone marrow, and to increase survival after being exposed to high doses of radiation. This medicine may be used for other purposes; ask your health care provider or pharmacist if you have questions. COMMON BRAND NAME(S): Fulphila, Neulasta, Nyvepria, UDENYCA, Ziextenzo What should I tell my health care provider before I take this medicine? They need to know if you have any of these conditions:  kidney disease  latex allergy  ongoing radiation therapy  sickle cell disease  skin reactions to acrylic adhesives (On-Body Injector only)  an unusual or allergic reaction to pegfilgrastim, filgrastim, other medicines, foods, dyes, or preservatives  pregnant or trying to get pregnant  breast-feeding How should I use this medicine? This medicine is for injection under the skin. If you get this medicine at home, you will be taught how to prepare and give the pre-filled syringe or how to use the On-body Injector. Refer to the patient Instructions for Use for detailed instructions. Use exactly as directed. Tell your healthcare provider immediately if you suspect that the On-body Injector may not have performed as intended or if you suspect the use of the On-body Injector resulted in a missed or partial dose. It is important that you put your used needles and syringes in a special sharps container. Do not put them in a trash can. If you do not have a sharps container, call your pharmacist or healthcare provider to get one. Talk to your pediatrician regarding the use of this medicine in children. While this drug  may be prescribed for selected conditions, precautions do apply. Overdosage: If you think you have taken too much of this medicine contact a poison control center or emergency room at once. NOTE: This medicine is only for you. Do not share this medicine with others. What if I miss a dose? It is important not to miss your dose. Call your doctor or health care professional if you miss your dose. If you miss a dose due to an On-body Injector failure or leakage, a new dose should be administered as soon as possible using a single prefilled syringe for manual use. What may interact with this medicine? Interactions have not been studied. This list may not describe all possible interactions. Give your health care provider a list of all the medicines, herbs, non-prescription drugs, or dietary supplements you use. Also tell them if you smoke, drink alcohol, or use illegal drugs. Some items may interact with your medicine. What should I watch for while using this medicine? Your condition will be monitored carefully while you are receiving this medicine. You may need blood work done while you are taking this medicine. Talk to your health care provider about your risk of cancer. You may be more at risk for certain types of cancer if you take this medicine. If you are going to need a MRI, CT scan, or other procedure, tell your doctor that you are using this medicine (On-Body Injector only). What side effects may I notice from receiving this medicine? Side effects that you should report to your doctor or health care professional as soon as possible:  allergic reactions (skin rash, itching or hives, swelling of   the face, lips, or tongue)  back pain  dizziness  fever  pain, redness, or irritation at site where injected  pinpoint red spots on the skin  red or dark-brown urine  shortness of breath or breathing problems  stomach or side pain, or pain at the shoulder  swelling  tiredness  trouble  passing urine or change in the amount of urine  unusual bruising or bleeding Side effects that usually do not require medical attention (report to your doctor or health care professional if they continue or are bothersome):  bone pain  muscle pain This list may not describe all possible side effects. Call your doctor for medical advice about side effects. You may report side effects to FDA at 1-800-FDA-1088. Where should I keep my medicine? Keep out of the reach of children. If you are using this medicine at home, you will be instructed on how to store it. Throw away any unused medicine after the expiration date on the label. NOTE: This sheet is a summary. It may not cover all possible information. If you have questions about this medicine, talk to your doctor, pharmacist, or health care provider.  2021 Elsevier/Gold Standard (2019-05-16 13:20:51)  

## 2020-07-15 NOTE — Progress Notes (Signed)
Bilateral lower extremity venous duplex has been completed. Preliminary results can be found in CV Proc through chart review.  Results were given to Cira Rue NP.  07/15/20 10:28 AM Lindsay [name] RVT

## 2020-07-15 NOTE — Progress Notes (Signed)
Spoke to patient when here for GCSF, let her know doppler is negative for DVT. Encouraged her to ambulate, wear compression stockings, elevate legs when resting, and increase protein in diet. She understands and appreciates update.   Cira Rue, NP

## 2020-07-15 NOTE — Telephone Encounter (Signed)
Called pt per 3/9 los - left message for patient with new appt date and time

## 2020-07-19 ENCOUNTER — Encounter: Payer: Self-pay | Admitting: *Deleted

## 2020-07-19 NOTE — Progress Notes (Signed)
Location of Breast Cancer: Malignant neoplasm of upper-outer quadrant of LEFT breast, estrogen receptor negative   Histology per Pathology Report:  12/25/2020 FINAL MICROSCOPIC DIAGNOSIS:  A. BREAST, LEFT, MODIFIED RADICAL MASTECTOMY:  - Metaplastic carcinoma, multifocal, 6 cm in greatest dimension, Nottingham grade 3 of 3.  - Ductal carcinoma in situ, high nuclear grade with central necrosis.  - Margins of resection:  - Metaplastic carcinoma focally involves the anterior margin and is < 1 mm from the posterior margin.  - DCIS is < 1 mm from the anterior margin.  - Metastatic carcinoma in (13) of (16) lymph nodes with extranodal extension.  - Biopsy clip sites in breast and one lymph node.   Receptor Status: ER(0%), PR (0%), Her2-neu (Negative via FISH), Ki-67(10%)  Did patient present with symptoms (if so, please note symptoms) or was this found on screening mammography?:  Presented with a palpable left breast lump.   Ultrasound of breast on 10/27/19 revealed: 2.6 cm right breast calcifications in upper slightly medial area; 3.9 cm left breast dominant mass at 3 o'clock with overlying skin thickening; 3 cm left breast mass/distorted tissue at 1 o'clock, likely in contiguity with dominant mass and overall spanning 6 cm; left breast distortion at 2 o'clock; two abnormal left axillary lymph nodes.     Past/Anticipated interventions by surgeon, if any: 12/26/2019 Dr. Autumn Messing --[place] (Left) - PEC BLOCK --INSERTION PORT-A-CATH (right subclavian vein)  Past/Anticipated interventions by medical oncology, if any:  Under care of Dr. Truitt Merle 07/13/2020 Regan Rakers [place]-NP's note) -She was diagnosed in 10/2019 with left breastmetaplasticcancer, triple negative disease metastatic to left axillary LN. Her 11/2019 CT CAP and bone scan was negative for distant metastasis.  -S/pleft mastectomy with Dr Marlou Starks on 12/26/19.Surgical pathshowed6cm multifocal metaplastic  carcinomaand13/16 positive LN but unfortunately 1 positive LN was not able to be removeddue to vascularinvasion. -She startedadjuvant chemo with Keytrudaq3weeks(to be taken for 1 whole year)withweekly Carboplatin/Taxolfor 12 weeksbeginning9/30/21- 1/6/22followed byAC q2 weeks x4starting 05/20/20. -interim left axillary [place] 06/17/20 is negative for adenopathy, c/w response to adjuvant chemo -plan to proceed with radiation after adjuvant chemo -F/up in 3 weeks with another cycle of pembro.   Lymphedema issues, if any: Patient denies (states she has a compression sleeve from her PT sessions)    Pain issues, if any:  Reports occasional discomfort to her left chest wall   SAFETY ISSUES:  Prior radiation? No  Pacemaker/ICD? No  Possible current pregnancy? No--postmenopausal  Is the patient on methotrexate? No  Current Complaints / other details:   Patient has received both Moderna vaccines but has not received a booster vaccine yet

## 2020-07-20 ENCOUNTER — Ambulatory Visit
Admission: RE | Admit: 2020-07-20 | Discharge: 2020-07-20 | Disposition: A | Discharge status: Home / Self Care | Payer: Medicare Other | Source: Ambulatory Visit | Attending: Radiation Oncology | Admitting: Radiation Oncology

## 2020-07-20 ENCOUNTER — Other Ambulatory Visit: Payer: Self-pay | Admitting: Nurse Practitioner

## 2020-07-20 ENCOUNTER — Encounter: Payer: Self-pay | Admitting: Radiation Oncology

## 2020-07-20 DIAGNOSIS — C50412 Malignant neoplasm of upper-outer quadrant of left female breast: Secondary | ICD-10-CM

## 2020-07-20 DIAGNOSIS — Z171 Estrogen receptor negative status [ER-]: Secondary | ICD-10-CM

## 2020-07-20 NOTE — Progress Notes (Signed)
Radiation Oncology         [phone] ________________________________  Name: Lindsay [name] MRN: [id number]  Date: 07/20/2020  DOB: 02/22/1956  Follow-Up Visit Note by telephone.  The patient opted for telemedicine to maximize safety during the pandemic.  MyChart video was not obtainable.  Outpatient  CC: Scotty Court, DO  Truitt Merle, MD  Diagnosis:      ICD-10-CM   1. Malignant neoplasm of upper-outer quadrant of left breast in female, estrogen receptor negative ([place])  C50.412 Consult to spiritual care   Z17.1   Cancer Staging Malignant neoplasm of upper-outer quadrant of left breast in female, estrogen receptor negative ([place]) Staging form: Breast, AJCC 8th Edition - Clinical stage from 10/28/2019: Stage IIIB (cT2, cN1, cM0, G3, ER-, PR-, HER2-) - Signed by Eppie Gibson, MD on 11/05/2019 Stage prefix: Initial diagnosis Nuclear grade: G3 Histologic grading system: 3 grade system - Pathologic stage from 12/26/2019: Stage IIIC (pT3, pN3a, cM0, G3, ER-, PR-, HER2-) - Signed by Truitt Merle, MD on 01/28/2020 Stage prefix: Initial diagnosis Multigene prognostic tests performed: None Histologic grading system: 3 grade system Residual tumor (R): R0 - None   CHIEF COMPLAINT: Here to discuss management of left breast cancer  Narrative:  The patient returns today for follow-up. She was seen in consultation on 11/05/2019.   Since consultation date, she underwent the following imaging (dates and results as follows): 1. CT scan of chest/abdomen/pelvis on 11/21/2019 that showed left breast mass and prominent left axillary lymph nodes, containing biopsy marking clips, consistent with newly diagnosed breast malignancy. There was no definite evidence of distant metastatic disease in the chest, abdomen, or pelvis. There were occasional small pulmonary nodules, measuring 2-3 mm, most likely incidental sequelae of prior infection or inflammation, although metastatic disease was not  excluded. 2. Bone scan on 11/21/2019 that showed a single focus of uptake associated with a subacute fracture of the left anterior fourth rib. There were no additional areas of abnormal uptake. 3. Ultrasound of the left axilla on 06/17/2020 that did not show any left axillary adenopathy.  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  Systemic therapy, if applicable, involved (dates and therapy as follows): 1. Keytruda with weekly Carboplatin and Taxol for 12 weeks from 02/05/2020 to 05/13/2020. 2. Adriamycin/Cytoxan beginning on 05/20/2020  Symptomatically, the patient reports: recovering from chemotherapy.  It has been very difficult. She is fatigued and wishes to wait 4 weeks before starting radiotherapy.    She also wishes to undergo restaging scans before starting RT, to make sure she does not have disseminated disease.  Lymphedema issues, if any: Patient denies (states she has a compression sleeve from her PT sessions)    Pain issues, if any:  Reports occasional discomfort to her left chest wall   SAFETY ISSUES:  Prior radiation? No  Pacemaker/ICD? No  Possible current pregnancy? No--postmenopausal  Is the patient on methotrexate? No  Current Complaints / other details:   Patient has received both Moderna vaccines but has not received a booster vaccine yet           ALLERGIES:  is allergic to  codeine.  Meds: Current Outpatient Medications  Medication Sig Dispense Refill  . acetaminophen (TYLENOL) 650 MG CR tablet Take 650 mg by mouth every 8 (eight) hours as needed for pain.      Marland Kitchen amoxicillin-clavulanate (AUGMENTIN) 875-125 MG tablet Take 1 tablet by mouth 2 (two) times daily. 14 tablet 0  . ascorbic acid (VITAMIN C) 250 MG CHEW Chew by mouth.    . dexamethasone (DECADRON) 0.5 MG/5ML solution Take 10 ml (1 mg) swish for 2 minutes and then spit.  Do this four times daily as needed Do Not eat or drink for 1 hour after. 240 mL 3  . diphenoxylate-atropine (LOMOTIL) 2.5-0.025 MG tablet Take 1-2 tablets by mouth 4 (four) times daily as needed for diarrhea or loose stools. 30 tablet 1  . gabapentin (NEURONTIN) 300 MG capsule Take 300 mg by mouth 3 (three) times daily as needed.    . lidocaine (XYLOCAINE) 2 % solution Use as directed 15 mLs in the mouth or throat every 4 (four) hours as needed for mouth pain. 100 mL 2  . lidocaine-prilocaine (EMLA) cream Apply 1 application topically as needed. 30 g 2  . methocarbamol (ROBAXIN) 750 MG tablet Take 1 tablet (750 mg total) by mouth 4 (four) times daily as needed (use for muscle cramps/pain). 30 tablet 2  . mucosal barrier oral (GELCLAIR) GEL Take 1 packet by mouth 2 (two) times daily as needed. 15 packet 1  . omeprazole (PRILOSEC) 20 MG capsule Take 1 capsule (20 mg total) by mouth daily. 30 capsule 2  . ondansetron (ZOFRAN) 8 MG tablet Take 1 tablet (8 mg total) by mouth 2 (two) times daily as needed for refractory nausea / vomiting. Start on day 3 after carboplatin chemo. 30 tablet 1  . prochlorperazine (COMPAZINE) 10 MG tablet Take 1 tablet (10 mg total) by mouth every 6 (six) hours as needed (Nausea or vomiting). 30 tablet 1  . valACYclovir (VALTREX) 1000 MG tablet Take 1 tablet (1,000 mg total) by mouth 2 (two) times daily. 14 tablet 1   No current facility-administered medications for this encounter.    Physical Findings:  vitals were not taken for this visit. .     General: Alert and oriented, in no acute distress   Lab Findings: Lab Results  Component Value Date   WBC 4.7 07/13/2020   HGB 10.3 (L) 07/13/2020    HCT 30.3 (L) 07/13/2020   MCV 103.4 (H) 07/13/2020   PLT 324 07/13/2020    _0 @  Radiographic Findings: VAS [place] LOWER EXTREMITY VENOUS (DVT)  Result Date: 07/15/2020  Lower Venous DVT Study Indications: Edema.  Risk Factors: Cancer. Comparison Study: No prior studies. Performing Technologist: Oliver Hum RVT  Examination Guidelines: A complete evaluation includes B-mode imaging, spectral Doppler, color Doppler, and power Doppler as needed of all accessible portions of each vessel. Bilateral testing is considered an integral part of a complete examination. Limited examinations for reoccurring indications may be performed as noted. The reflux portion of the exam is performed with the patient in reverse Trendelenburg.  +---------+---------------+---------+-----------+----------+--------------+ RIGHT    CompressibilityPhasicitySpontaneityPropertiesThrombus Aging +---------+---------------+---------+-----------+----------+--------------+ CFV      Full           Yes      Yes                                 +---------+---------------+---------+-----------+----------+--------------+ SFJ      Full                                                        +---------+---------------+---------+-----------+----------+--------------+  FV Prox  Full                                                        +---------+---------------+---------+-----------+----------+--------------+ FV Mid   Full                                                        +---------+---------------+---------+-----------+----------+--------------+ FV DistalFull                                                        +---------+---------------+---------+-----------+----------+--------------+ PFV      Full                                                        +---------+---------------+---------+-----------+----------+--------------+ POP      Full           Yes      Yes                                  +---------+---------------+---------+-----------+----------+--------------+ PTV      Full                                                        +---------+---------------+---------+-----------+----------+--------------+ PERO     Full                                                        +---------+---------------+---------+-----------+----------+--------------+   +---------+---------------+---------+-----------+----------+--------------+ LEFT     CompressibilityPhasicitySpontaneityPropertiesThrombus Aging +---------+---------------+---------+-----------+----------+--------------+ CFV      Full           Yes      Yes                                 +---------+---------------+---------+-----------+----------+--------------+ SFJ      Full                                                        +---------+---------------+---------+-----------+----------+--------------+ FV Prox  Full                                                        +---------+---------------+---------+-----------+----------+--------------+  FV Mid   Full                                                        +---------+---------------+---------+-----------+----------+--------------+ FV DistalFull                                                        +---------+---------------+---------+-----------+----------+--------------+ PFV      Full                                                        +---------+---------------+---------+-----------+----------+--------------+ POP      Full           Yes      Yes                                 +---------+---------------+---------+-----------+----------+--------------+ PTV      Full                                                        +---------+---------------+---------+-----------+----------+--------------+ PERO     Full                                                         +---------+---------------+---------+-----------+----------+--------------+     Summary: RIGHT: - There is no evidence of deep vein thrombosis in the lower extremity.  - No cystic structure found in the popliteal fossa.  LEFT: - There is no evidence of deep vein thrombosis in the lower extremity.  - No cystic structure found in the popliteal fossa.  *See table(s) above for measurements and observations. Electronically signed by Jamelle Haring on 07/15/2020 at 1:18:26 PM.    Final     Impression/Plan: Left breast cancer  We discussed adjuvant radiotherapy today.  I recommend radiation therapy to the left chest wall and regional nodes in order to reduce risk of local regional recurrence by two thirds.  I reviewed the logistics, benefits, risks, and potential side effects of this treatment in detail. Risks may include but not necessary be limited to acute and late injury tissue in the radiation fields such as skin irritation (change in color/pigmentation, itching, dryness, pain, peeling). She may experience fatigue. We also discussed possible risk of long term cosmetic changes or scar tissue. There is also a smaller risk for lung toxicity, cardiac toxicity, brachial plexopathy, lymphedema, musculoskeletal changes, rib fragility or induction of a second malignancy, late chronic non-healing soft tissue wound.  We discussed the risk of   pneumonitis which is more common in patients that are concurrently receiving pembrolizumab.  The patient asked good questions which I  answered to her satisfaction. She is enthusiastic about proceeding with treatment evenutally but I have cancelled her CT simulation to be moved back - she was tentatively scheduled for a CT simulation this Friday but she is adamant that she wants restaging scans before she undergoes radiation planning.  She also wishes to wait a total of 4 weeks before starting radiation therapy so she has more time to recover from chemotherapy.  She is continuing  immunotherapy and this will overlap with radiation therapy.  I have sent a message to medical oncology asking for their team to arrange restaging scans and a phone call or follow-up with Dr. Burr Medico to go over the results (Ms Peraza wants to talk about the results with Dr. Burr Medico).  Our team will reschedule a CT simulation to take place 3 to 5 working days afterwards.  Ms Gluth seems very well educated about her type of cancer and has a heightened awareness of the risk for developing metastatic disease.  She does not want to go through local regional radiation if the disease has disseminated.  She is grateful that we will reschedule her treatment planning to take place after her restaging scans.  Emotional stress: I recommend a referral to spiritual care.  She is open to a referral but wishes not to engage with someone until next week.   This encounter was provided by telemedicine platform; patient desired telemedicine during pandemic precautions.  MyChart video was not available and therefore telephone was used. The patient has given verbal consent for this type of encounter and has been advised to only accept a meeting of this type in a secure network environment. On date of service, in total, I spent 45 minutes on this encounter which included communication /counseling with the patient, review of records, documentation, coordination of care with other providers.   The attendants for this meeting include Eppie Gibson  and Edison Pace [facility] During the encounter, Eppie Gibson was located at [facility] Radiation Oncology Department.  Edison Pace Yeley was located at home.   _____________________________________   Eppie Gibson, MD  This document serves as a record of services personally performed by Eppie Gibson, MD. It was created on his behalf by Clerance Lav, a trained medical scribe. The creation of this record is based on the scribe's personal observations and the provider's  statements to them. This document has been checked and approved by the attending provider.

## 2020-07-21 ENCOUNTER — Encounter: Payer: Self-pay | Admitting: General Practice

## 2020-07-21 NOTE — Progress Notes (Signed)
[facility] Spiritual Care Note  Referred by radiation oncology for spiritual/emotional support and acquainted with Ms Barrales from [facility]. Left voicemail reintroducing Spiritual Care as part of her support team, encouraging return call.   [place], [place], [facility] Pager [phone] Voicemail [phone]

## 2020-07-22 ENCOUNTER — Ambulatory Visit: Payer: Medicare Other

## 2020-07-22 ENCOUNTER — Other Ambulatory Visit: Payer: Medicare Other

## 2020-07-22 ENCOUNTER — Telehealth: Payer: Self-pay

## 2020-07-22 ENCOUNTER — Ambulatory Visit: Payer: Medicare Other | Admitting: Hematology

## 2020-07-22 NOTE — Telephone Encounter (Signed)
Lindsay [name] left vm stating she has been constipated and what she has tried has not worked except for suppositories.  I returned her call and left a vm I instructed her to drink 1/2 bottle of magnesium citrate, if no bm in 1 hour drink the other 1/2.  I instructed her to then start Miralax 1-2 times per day.  I instructed her to call if she has already tried mag citrate or milk of magnesia.

## 2020-07-23 ENCOUNTER — Ambulatory Visit: Payer: Medicare Other | Admitting: Radiation Oncology

## 2020-07-29 ENCOUNTER — Encounter: Payer: Self-pay | Admitting: *Deleted

## 2020-07-29 ENCOUNTER — Other Ambulatory Visit: Payer: Medicare Other

## 2020-07-29 ENCOUNTER — Ambulatory Visit: Payer: Medicare Other

## 2020-07-30 ENCOUNTER — Encounter (HOSPITAL_COMMUNITY): Admission: RE | Admit: 2020-07-30 | Payer: Medicare Other | Source: Ambulatory Visit

## 2020-07-30 ENCOUNTER — Ambulatory Visit (HOSPITAL_COMMUNITY): Payer: Medicare Other

## 2020-07-31 ENCOUNTER — Ambulatory Visit: Payer: Medicare Other

## 2020-08-03 ENCOUNTER — Other Ambulatory Visit: Payer: Self-pay

## 2020-08-03 ENCOUNTER — Inpatient Hospital Stay: Payer: Medicare Other

## 2020-08-03 ENCOUNTER — Encounter: Payer: Self-pay | Admitting: Nurse Practitioner

## 2020-08-03 ENCOUNTER — Inpatient Hospital Stay (HOSPITAL_BASED_OUTPATIENT_CLINIC_OR_DEPARTMENT_OTHER): Payer: Medicare Other | Admitting: Nurse Practitioner

## 2020-08-03 ENCOUNTER — Encounter: Payer: Self-pay | Admitting: General Practice

## 2020-08-03 VITALS — BP 140/68 | HR 86 | Temp 98.1°F | Resp 18 | Ht 61.0 in | Wt 154.5 lb

## 2020-08-03 DIAGNOSIS — Z171 Estrogen receptor negative status [ER-]: Secondary | ICD-10-CM | POA: Diagnosis not present

## 2020-08-03 DIAGNOSIS — C50412 Malignant neoplasm of upper-outer quadrant of left female breast: Secondary | ICD-10-CM | POA: Diagnosis not present

## 2020-08-03 DIAGNOSIS — Z95828 Presence of other vascular implants and grafts: Secondary | ICD-10-CM

## 2020-08-03 DIAGNOSIS — Z5112 Encounter for antineoplastic immunotherapy: Secondary | ICD-10-CM | POA: Diagnosis not present

## 2020-08-03 LAB — CBC WITH DIFFERENTIAL (CANCER CENTER ONLY)
Abs Immature Granulocytes: 0.03 10*3/uL (ref 0.00–0.07)
Basophils Absolute: 0.1 10*3/uL (ref 0.0–0.1)
Basophils Relative: 2 %
Eosinophils Absolute: 0 10*3/uL (ref 0.0–0.5)
Eosinophils Relative: 1 %
HCT: 28.6 % — ABNORMAL LOW (ref 36.0–46.0)
Hemoglobin: 9.8 g/dL — ABNORMAL LOW (ref 12.0–15.0)
Immature Granulocytes: 1 %
Lymphocytes Relative: 20 %
Lymphs Abs: 0.9 10*3/uL (ref 0.7–4.0)
MCH: 35.3 pg — ABNORMAL HIGH (ref 26.0–34.0)
MCHC: 34.3 g/dL (ref 30.0–36.0)
MCV: 102.9 fL — ABNORMAL HIGH (ref 80.0–100.0)
Monocytes Absolute: 0.9 10*3/uL (ref 0.1–1.0)
Monocytes Relative: 19 %
Neutro Abs: 2.7 10*3/uL (ref 1.7–7.7)
Neutrophils Relative %: 57 %
Platelet Count: 334 10*3/uL (ref 150–400)
RBC: 2.78 MIL/uL — ABNORMAL LOW (ref 3.87–5.11)
RDW: 15.3 % (ref 11.5–15.5)
WBC Count: 4.5 10*3/uL (ref 4.0–10.5)
nRBC: 0 % (ref 0.0–0.2)

## 2020-08-03 LAB — CMP (CANCER CENTER ONLY)
ALT: 13 U/L (ref 0–44)
AST: 48 U/L — ABNORMAL HIGH (ref 15–41)
Albumin: 3.5 g/dL (ref 3.5–5.0)
Alkaline Phosphatase: 71 U/L (ref 38–126)
Anion gap: 10 (ref 5–15)
BUN: <4 mg/dL — ABNORMAL LOW (ref 8–23)
CO2: 24 mmol/L (ref 22–32)
Calcium: 8.5 mg/dL — ABNORMAL LOW (ref 8.9–10.3)
Chloride: 104 mmol/L (ref 98–111)
Creatinine: 0.63 mg/dL (ref 0.44–1.00)
GFR, Estimated: >60 mL/min (ref >60)
Glucose, Bld: 96 mg/dL (ref 70–99)
Potassium: 3.5 mmol/L (ref 3.5–5.1)
Sodium: 138 mmol/L (ref 135–145)
Total Bilirubin: 0.4 mg/dL (ref 0.3–1.2)
Total Protein: 5.6 g/dL — ABNORMAL LOW (ref 6.5–8.1)

## 2020-08-03 MED ORDER — SODIUM CHLORIDE 0.9 % IV SOLN
200.0000 mg | Freq: Once | INTRAVENOUS | Status: AC
  Administered 2020-08-03: 200 mg via INTRAVENOUS
  Filled 2020-08-03: qty 8

## 2020-08-03 MED ORDER — HEPARIN SOD (PORK) LOCK FLUSH 100 UNIT/ML IV SOLN
500.0000 [IU] | Freq: Once | INTRAVENOUS | Status: AC | PRN
  Administered 2020-08-03: 500 [IU]
  Filled 2020-08-03: qty 5

## 2020-08-03 MED ORDER — SODIUM CHLORIDE 0.9 % IV SOLN
Freq: Once | INTRAVENOUS | Status: AC
  Filled 2020-08-03: qty 250

## 2020-08-03 MED ORDER — SODIUM CHLORIDE 0.9% FLUSH
10.0000 mL | INTRAVENOUS | Status: DC | PRN
  Administered 2020-08-03: 10 mL
  Filled 2020-08-03: qty 10

## 2020-08-03 MED ORDER — SODIUM CHLORIDE 0.9% FLUSH
10.0000 mL | Freq: Once | INTRAVENOUS | Status: AC
  Administered 2020-08-03: 10 mL
  Filled 2020-08-03: qty 10

## 2020-08-03 NOTE — Patient Instructions (Signed)
[facility] Discharge Instructions for Patients Receiving Chemotherapy  Today you received the following chemotherapy agents:  Keytruda.  To help prevent nausea and vomiting after your treatment, we encourage you to take your nausea medication as directed.   If you develop nausea and vomiting that is not controlled by your nausea medication, call the clinic.   BELOW ARE SYMPTOMS THAT SHOULD BE REPORTED IMMEDIATELY:  *FEVER GREATER THAN 100.5 F  *CHILLS WITH OR WITHOUT FEVER  NAUSEA AND VOMITING THAT IS NOT CONTROLLED WITH YOUR NAUSEA MEDICATION  *UNUSUAL SHORTNESS OF BREATH  *UNUSUAL BRUISING OR BLEEDING  TENDERNESS IN MOUTH AND THROAT WITH OR WITHOUT PRESENCE OF ULCERS  *URINARY PROBLEMS  *BOWEL PROBLEMS  UNUSUAL RASH Items with * indicate a potential emergency and should be followed up as soon as possible.  Feel free to call the clinic should you have any questions or concerns. The clinic phone number is [phone].  Please show the CHEMO ALERT CARD at check-in to the Emergency Department and triage nurse.    

## 2020-08-03 NOTE — Progress Notes (Signed)
[facility] Spiritual Care Note  Followed up with Lindsay [name] in infusion. She shared deeply about her faith and healing experiences that have been so meaningful for her through treatment. Lindsay Schaum welcomes pastoral phone calls and visits in treatment, so we plan to follow up by phone in 1-2 weeks and again at her next infusion.    [place], [place], [facility] Pager [phone] Voicemail [phone]

## 2020-08-03 NOTE — Progress Notes (Signed)
[place]   Telephone:([phone]) [phone] Fax:(336) [phone]   Clinic Follow up Note   Patient Care Team: Scotty Court, DO as PCP - General Mauro Kaufmann, RN as Oncology Nurse Navigator Rockwell Germany, RN as Oncology Nurse Navigator [name], MD as Consulting Physician (General Surgery) Truitt Merle, MD as Consulting Physician (Hematology) Eppie Gibson, MD as Attending Physician (Radiation Oncology) 08/03/2020  CHIEF COMPLAINT: Follow-up metaplastic left breast cancer  SUMMARY OF ONCOLOGIC HISTORY: Oncology History Overview Note  Cancer Staging Malignant neoplasm of upper-outer quadrant of left breast in female, estrogen receptor negative ([place]) Staging form: Breast, AJCC 8th Edition - Clinical stage from 10/28/2019: Stage IIIB (cT2, cN1, cM0, G3, ER-, PR-, HER2-) - Signed by Eppie Gibson, MD on 11/05/2019 - Pathologic stage from 12/26/2019: Stage IIIC (pT3, pN3a, cM0, G3, ER-, PR-, HER2-) - Signed by Truitt Merle, MD on 01/28/2020    Malignant neoplasm of upper-outer quadrant of left breast in female, estrogen receptor negative ([place])  10/27/2019 Mammogram   IMPRESSION: 1. Right breast calcifications spanning 2.6 cm in the upper slightly medial breast are indeterminate.   2. Left breast dominant mass at 3 o'clock, 6 cm from nipple measuring 3x3.9x2.9 cm is highly suspicious. There is overlying skin thickening, skin involvement is not excluded.   3. Left breast mass/distorted tissue at 1 o'clock, 4cm from nipple measuring 3.0x1.2x2.9 cm is likely in contiguity with the dominant mass at 3 o'clock and is also highly suspicious. With the dominant lesion the overall span a disease is approximately 6 cm.   4. Left breast distortion at 2 o'clock 10 cm from the nipple is Suspicious, measuring 1.0 x 0.8 cm.   5.  Abnormal left axillary lymph nodes (2). Cortical thickness measures up to 0.6 cm.    10/28/2019 Initial Biopsy   Diagnosis 1. Breast, left, needle core  biopsy, 3 o'clock, 5cmfn - INVASIVE MAMMARY CARCINOMA. 2. Breast, left, needle core biopsy, 2 o'clock, 10cmfn - INVASIVE MAMMARY CARCINOMA. 3. 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 Microscopic Comment 1. -3. Overall, immunohistochemistry favors a pronounced desmoplastic response, but metaplastic carcinoma cannot be excluded (Epithelioid component: CKAE1AE3 strong +, CK5/6 weak +). The greatest linear extent of tumor in any one core in specimen 1 is 14 mm. The greatest linear extent of tumor in any one core in specimen 2 is 16 mm.   10/28/2019 Receptors her2   3. PROGNOSTIC INDICATORS Results: IMMUNOHISTOCHEMICAL AND MORPHOMETRIC ANALYSIS PERFORMED MANUALLY The tumor cells are EQUIVOCAL for Her2 (2+). Her2 by FISH will be performed and results reported separately. Estrogen Receptor: 0%, NEGATIVE Progesterone Receptor: 0%, NEGATIVE Proliferation Marker Ki67: 10%    3. FLUORESCENCE IN-SITU HYBRIDIZATION Results: GROUP 5: HER2 **NEGATIVE** Equivocal form of amplification of the HER2 gene was detected in the IHC 2+ tissue sample received from this individual. HER2 FISH was performed by a technologist and cell imaging and analysis on the [employer].   10/28/2019 Cancer Staging   Staging form: Breast, AJCC 8th Edition - Clinical stage from 10/28/2019: Stage IIIB (cT2, cN1, cM0, G3, ER-, PR-, HER2-) - Signed by Eppie Gibson, MD on 11/05/2019   10/31/2019 Initial Diagnosis   Malignant neoplasm of upper-outer quadrant of left breast in female, estrogen receptor negative ([place])   10/31/2019 Pathology Results   Diagnosis Breast, right, needle core biopsy, UOQ, posterior - FOCAL USUAL DUCTAL HYPERPLASIA AND FIBROCYSTIC CHANGES WITH CALCIFICATIONS - FIBROADENOMATOID CHANGES - NO MALIGNANCY IDENTIFIED Microscopic Comment These results  were called to [facility] on November 03, 2019.   11/05/2019 Genetic Testing   She declined  Genetic testing    11/21/2019 Imaging   CT CAP w contrast  IMPRESSION: 1. Left breast mass and prominent left axillary lymph nodes, containing biopsy marking clips, consistent with newly diagnosed breast malignancy. 2. No definite evidence of distant metastatic disease in the chest, abdomen, or pelvis. 3. There are occasional small pulmonary nodules, measuring 2-3 mm, most likely incidental sequelae of prior infection or inflammation although metastatic disease is not excluded. Attention on follow-up. 4. Hepatic steatosis. 5. Aortic Atherosclerosis (ICD10-I70.0).     11/21/2019 Imaging   Bone Scan Whole Body IMPRESSION: 1. Single focus of uptake associated with a subacute fracture of LEFT anterior fourth rib. 2. No additional areas of abnormal uptake.   12/26/2019 Cancer Staging   Staging form: Breast, AJCC 8th Edition - Pathologic stage from 12/26/2019: Stage IIIC (pT3, pN3a, cM0, G3, ER-, PR-, HER2-) - Signed by Truitt Merle, MD on 01/28/2020   12/26/2019 Surgery   LEFT MASTECTOMY MODIFIED RADICAL and PAC Placement by Dr Marlou Starks   12/26/2019 Pathology Results   FINAL MICROSCOPIC DIAGNOSIS:   A. 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  - Ductal carcinoma in situ, high nuclear grade with central necrosis.  - Margins of resection:  - Metaplastic carcinoma focally involves the anterior margin and is < 1  mm from the posterior margin.  - DCIS is < 1 mm from the anterior margin.  - Metastatic carcinoma in (13) of (16) lymph nodes with extranodal  extension.  - Biopsy clip sites in breast and one lymph node.  - See oncology table.    ADDENDUM:  PROGNOSTIC INDICATOR RESULTS:  Immunohistochemical and morphometric analysis performed manually  The tumor cells are EQUIVOCAL for Her2 (2+). Her2 FISH has been ordered  and will be reported in an addendum.  Estrogen Receptor:       NEGATIVE   Progesterone Receptor:   NEGATIVE  Proliferation Marker Ki-67:   30%    ADDENDUM:  FLOURESCENCE IN-SITU HYBRIDIZATION RESULTS:  GROUP 5:   HER2 NEGATIVE   01/28/2020 Echocardiogram   Baseline Echo  IMPRESSIONS     1. Left ventricular ejection fraction, by estimation, is 60 to 65%. The  left ventricle has normal function. The left ventricle has no regional  wall motion abnormalities. Left ventricular diastolic parameters are  consistent with Grade I diastolic  dysfunction (impaired relaxation). The average left ventricular global  longitudinal strain is -19.8 %.   2. Right ventricular systolic function is normal. The right ventricular  size is normal. Tricuspid regurgitation signal is inadequate for assessing  PA pressure.   3. The mitral valve is normal in structure. No evidence of mitral valve  regurgitation. No evidence of mitral stenosis.   4. The aortic valve is normal in structure. Aortic valve regurgitation is  not visualized. No aortic stenosis is present.   5. The inferior vena cava is normal in size with greater than 50%  respiratory variability, suggesting right atrial pressure of 3 mmHg.    02/05/2020 -  Chemotherapy   Keytruda q3weeks (to be taken for 1 whole year) with weekly Carboplatin/Taxol for 12 weeks starting 02/05/20-05/13/20 followed by Adriamycin/Cytoxan q2weeks X4 starting 05/20/20   06/17/2020 Breast US   FINDINGS: On physical exam,well-healed scars of LEFT mastectomy and LEFT axillary node dissection. I palpate no axillary  mass. Ultrasound is performed, showing normal appearing LEFT axillary contents. No mass or enlarged lymph nodes. IMPRESSION: Ultrasound is negative for LEFT axillary adenopathy.     CURRENT THERAPY:  1. Adjuvant chemo weekly carbo/taxol x12 weeks 02/05/20 - 05/13/20 followed by Charna Archer q2 weeks x4 05/20/20 - 07/13/20  2. plus pembrolizumab q3 weeks to complete 1 year 3. PENDING adjuvant RT per Dr. Isidore Moos   INTERVAL HISTORY: Ms. Poblano  presents for follow-up and treatment as scheduled.  She completed final cycle of chemo [facility]) and received another cycle of Keytruda on 07/13/2020.  She was seen for RT consult by Dr. Isidore Moos on 07/20/2020.  The last and final cycle of chemo was very bad, she was "so sick" with extreme fatigue and mouth sores.  She was able to drink water but not eat.  She also became impacted, she tried fiber, stool softeners, and laxatives and this eventually resolved.  She required well over 2 weeks to recover.  She had an episode of drenching night sweats then the next night she feels that God waved a hand over her and her side effects were lifted.  She has been able to recover more each day after that, she worked over the weekend but not much.  She still has exertional dyspnea, no fever, chills, cough, chest pain.  Swelling in the legs is mild and equal.  She had numbness in the second toe of right foot during chemo which is now coming back to normal.  She cannot make her staging scan appointments due to severe side effects.   MEDICAL HISTORY:  Past Medical History:  Diagnosis Date  . Bronchitis   . Family history of bone cancer   . Family history of breast cancer   . Family history of prostate cancer   . Fibromyalgia   . HCV (hepatitis C virus)   . MRSA (methicillin resistant Staphylococcus aureus) 2012    SURGICAL HISTORY: Past Surgical History:  Procedure Laterality Date  . MASTECTOMY MODIFIED RADICAL Left 12/26/2019   Procedure: LEFT MASTECTOMY MODIFIED RADICAL;  Surgeon: [name], MD;  Location: [place];  Service: General;  Laterality: Left;  PEC BLOCK  . PORTACATH PLACEMENT Right 12/26/2019   Procedure: INSERTION PORT-A-CATH WITH ULTRASOUND GUIDANCE;  Surgeon: [name], MD;  Location: [place];  Service: General;  Laterality: Right;    I have reviewed the social history and family history with the patient and they are unchanged from previous note.  ALLERGIES:  is allergic to  codeine.  MEDICATIONS:  Current Outpatient Medications  Medication Sig Dispense Refill  . acetaminophen (TYLENOL) 650 MG CR tablet Take 650 mg by mouth every 8 (eight) hours as needed for pain.     Marland Kitchen ascorbic acid (VITAMIN C) 250 MG CHEW Chew by mouth.    . dexamethasone (DECADRON) 0.5 MG/5ML solution Take 10 ml (1 mg) swish for 2 minutes and then spit.  Do this four times daily as needed Do Not eat or drink for 1 hour after. 240 mL 3  . diphenoxylate-atropine (LOMOTIL) 2.5-0.025 MG tablet Take 1-2 tablets by mouth 4 (four) times daily as needed for diarrhea or loose stools. 30 tablet 1  . gabapentin (NEURONTIN) 300 MG capsule Take 300 mg by mouth 3 (three) times daily as needed.    . lidocaine (XYLOCAINE) 2 % solution Use as directed 15 mLs in the mouth or throat every 4 (four) hours as needed for mouth pain. 100 mL 2  . lidocaine-prilocaine (EMLA) cream Apply 1  application topically as needed. 30 g 2  . methocarbamol (ROBAXIN) 750 MG tablet Take 1 tablet (750 mg total) by mouth 4 (four) times daily as needed (use for muscle cramps/pain). 30 tablet 2  . omeprazole (PRILOSEC) 20 MG capsule Take 1 capsule (20 mg total) by mouth daily. 30 capsule 2  . valACYclovir (VALTREX) 1000 MG tablet Take 1 tablet (1,000 mg total) by mouth 2 (two) times daily. 14 tablet 1   No current facility-administered medications for this visit.   Facility-Administered Medications Ordered in Other Visits  Medication Dose Route Frequency Provider Last Rate Last Admin  . heparin lock flush 100 unit/mL  500 Units Intracatheter Once PRN Truitt Merle, MD      . pembrolizumab [facility]) 200 mg in sodium chloride 0.9 % 50 mL chemo infusion  200 mg Intravenous Once Truitt Merle, MD 116 mL/hr at 08/03/20 1147 200 mg at 08/03/20 1147  . sodium chloride flush (NS) 0.9 % injection 10 mL  10 mL Intracatheter PRN Truitt Merle, MD        PHYSICAL EXAMINATION: ECOG PERFORMANCE STATUS: 1 - Symptomatic but completely ambulatory  Vitals:    08/03/20 1023  BP: 140/68  Pulse: 86  Resp: 18  Temp: 98.1 F (36.7 C)  SpO2: 99%   Filed Weights   08/03/20 1023  Weight: 154 lb 8 oz (70.1 kg)    GENERAL:alert, no distress and comfortable SKIN: No rash EYES:  sclera clear OROPHARYNX: No thrush or ulcers LUNGS: clear with normal breathing effort HEART: regular rate & rhythm, trace bilateral lower extremity edema Musculoskeletal: Nonfocal NEURO: alert & oriented x 3 with fluent speech, no focal motor/sensory deficits Breast exam: S/p left lumpectomy, incisions completely healed.  No nodularity or mass along the incision or chest wall.  No palpable left axillary adenopathy  LABORATORY DATA:  I have reviewed the data as listed CBC Latest Ref Rng & Units 08/03/2020 07/13/2020 06/25/2020  WBC 4.0 - 10.5 K/uL 4.5 4.7 4.0  Hemoglobin 12.0 - 15.0 g/dL 9.8(L) 10.3(L) 10.8(L)  Hematocrit 36.0 - 46.0 % 28.6(L) 30.3(L) 31.8(L)  Platelets 150 - 400 K/uL 334 324 373     CMP Latest Ref Rng & Units 08/03/2020 07/13/2020 06/25/2020  Glucose 70 - 99 mg/dL 96 97 105(H)  BUN 8 - 23 mg/dL <4(L) 4(L) 4(L)  Creatinine 0.44 - 1.00 mg/dL 0.63 0.60 0.68  Sodium 135 - 145 mmol/L 138 138 136  Potassium 3.5 - 5.1 mmol/L 3.5 3.6 3.7  Chloride 98 - 111 mmol/L 104 105 104  CO2 22 - 32 mmol/L '24 26 24  ' Calcium 8.9 - 10.3 mg/dL 8.5(L) 8.5(L) 8.9  Total Protein 6.5 - 8.1 g/dL 5.6(L) 5.4(L) 6.0(L)  Total Bilirubin 0.3 - 1.2 mg/dL 0.4 0.4 0.4  Alkaline Phos 38 - 126 U/L 71 71 71  AST 15 - 41 U/L 48(H) 43(H) 48(H)  ALT 0 - 44 U/L '13 13 15      ' RADIOGRAPHIC STUDIES: I have personally reviewed the radiological images as listed and agreed with the findings in the report. No results found.   ASSESSMENT & PLAN: Roxi Hlavaty Byrumis a 65 y.o.femalewith  1.Left breast Multifocal Metaplastic cancer,pT3N3aM0,including one unresectable axillary node, metaplastic carcinoma,ER-/PR-/HER2-, grade IIIC -She was diagnosed in 10/2019 with left  breastmetaplasticcancer, triple negative disease metastatic to left axillary LN. Her 11/2019 CT CAP and bone scan was negative for distant metastasis.  -S/pleft mastectomy with Dr Marlou Starks on 12/26/19.Surgical pathshowed6cm multifocal metaplastic carcinomaand13/16 positive LN but unfortunately 1 positive LN was  not able to be removeddue to vascularinvasion. -She startedadjuvant chemo with Keytrudaq3weeks(to be taken for 1 whole year)withweekly Carboplatin/Taxolfor 12 weeksbeginning9/30/21- 1/6/22followed byAC q2 weeks x4starting 05/20/20 - 07/13/20. -interim left axillary [place] 06/17/20 is negative for adenopathy, c/w response to adjuvant chemo -plan to proceed with radiation and continue Pembro t o complete 1 year  2. G3 mucositis and weight loss -she developed mucositis after cycle 2 AC and lost 8 lbs. However, she has lost close to 20 lbs on [facility] and 30 lbs since starting chemo in 01/2020 -encouraged her to increase supplements, eat q2 hours, and hydrate -declined appetite stimulant or dietician.  -continue oral supportive care  -avoiding dex on pembrolizumab  3. Hepatitis C,untreated,diagnosed in 1992 -pending referralto ID or Dawn Drazek for treatment after she completed chemo. -monitoring LFTs and for possible hep C flare while on chemo/immunotherapy  4. Social and financial support  -She has no children and no close relatives. She does have close friendsand church communitywho are supportiveand strong faith.  -She did not have medical insurance for many years. She recently applied for blue cross and blue shield which is not effective yet.  5. Mild anemia -Secondary to chemotherapy, mild, will continue monitoring. -Stable  Disposition  Ms. Bonaventura appears stable.  She completed final cycle of AC with G-CSF and continues every 3 weeks pembrolizumab.  She tolerated the last cycle poorly with severe mucositis, constipation, and fatigue.  She continues losing weight.   Side effects were not well managed with supportive care at home but she eventually recovered after 2 weeks.  I encouraged her to call/my chart message if she has severe side effects in the future.  We discussed symptom management and nutrition/hydration.  She wants assurance that her cancer is in remission.  I again reviewed left axillary ultrasound from 06/17/2020 shows no evidence of adenopathy which is consistent with a clinical response to treatment.  She understands that scans have limitations.  Encouraged her to maintain her good faith and positive outlook and continue the recommended treatment plan.  She has persistent anemia after chemo, CBC and CMP otherwise stable.  She will proceed with another cycle of pembrolizumab today as planned.  We will reschedule staging CT/bone scan.  She will return for CT Sim on 4/4, prefers to start RT after Easter.  Follow-up with next Pembro in 3 weeks.  All questions were answered. The patient knows to call the clinic with any problems, questions or concerns. No barriers to learning were detected.  Total encounter time was 30 minutes.     Alla Feeling, NP 08/03/20

## 2020-08-03 NOTE — Patient Instructions (Signed)
Implanted Port Insertion, Care After This sheet gives you information about how to care for yourself after your procedure. Your health care provider may also give you more specific instructions. If you have problems or questions, contact your health care provider. What can I expect after the procedure? After the procedure, it is common to have:  Discomfort at the port insertion site.  Bruising on the skin over the port. This should improve over 3-4 days. Follow these instructions at home: Port care  After your port is placed, you will get a manufacturer's information card. The card has information about your port. Keep this card with you at all times.  Take care of the port as told by your health care provider. Ask your health care provider if you or a family member can get training for taking care of the port at home. A home health care nurse may also take care of the port.  Make sure to remember what type of port you have. Incision care  Follow instructions from your health care provider about how to take care of your port insertion site. Make sure you: ? Wash your hands with soap and water before and after you change your bandage (dressing). If soap and water are not available, use hand sanitizer. ? Change your dressing as told by your health care provider. ? Leave stitches (sutures), skin glue, or adhesive strips in place. These skin closures may need to stay in place for 2 weeks or longer. If adhesive strip edges start to loosen and curl up, you may trim the loose edges. Do not remove adhesive strips completely unless your health care provider tells you to do that.  Check your port insertion site every day for signs of infection. Check for: ? Redness, swelling, or pain. ? Fluid or blood. ? Warmth. ? Pus or a bad smell.      Activity  Return to your normal activities as told by your health care provider. Ask your health care provider what activities are safe for you.  Do not  lift anything that is heavier than 10 lb (4.5 kg), or the limit that you are told, until your health care provider says that it is safe. General instructions  Take over-the-counter and prescription medicines only as told by your health care provider.  Do not take baths, swim, or use a hot tub until your health care provider approves. Ask your health care provider if you may take showers. You may only be allowed to take sponge baths.  Do not drive for 24 hours if you were given a sedative during your procedure.  Wear a medical alert bracelet in case of an emergency. This will tell any health care providers that you have a port.  Keep all follow-up visits as told by your health care provider. This is important. Contact a health care provider if:  You cannot flush your port with saline as directed, or you cannot draw blood from the port.  You have a fever or chills.  You have redness, swelling, or pain around your port insertion site.  You have fluid or blood coming from your port insertion site.  Your port insertion site feels warm to the touch.  You have pus or a bad smell coming from the port insertion site. Get help right away if:  You have chest pain or shortness of breath.  You have bleeding from your port that you cannot control. Summary  Take care of the port as told by your   health care provider. Keep the manufacturer's information card with you at all times.  Change your dressing as told by your health care provider.  Contact a health care provider if you have a fever or chills or if you have redness, swelling, or pain around your port insertion site.  Keep all follow-up visits as told by your health care provider. This information is not intended to replace advice given to you by your health care provider. Make sure you discuss any questions you have with your health care provider. Document Revised: 11/20/2017 Document Reviewed: 11/20/2017 Elsevier Patient Education   2021 Elsevier Inc.  

## 2020-08-04 ENCOUNTER — Telehealth: Payer: Self-pay | Admitting: Nurse Practitioner

## 2020-08-04 NOTE — Telephone Encounter (Signed)
Left message with follow-up appointment per 3/29 los. Gave option to call back to reschedule if needed.

## 2020-08-09 ENCOUNTER — Ambulatory Visit
Admission: RE | Admit: 2020-08-09 | Discharge: 2020-08-09 | Disposition: A | Discharge status: Home / Self Care | Payer: Medicare Other | Source: Ambulatory Visit | Attending: Radiation Oncology | Admitting: Radiation Oncology

## 2020-08-09 ENCOUNTER — Other Ambulatory Visit: Payer: Self-pay

## 2020-08-09 DIAGNOSIS — Z510 Encounter for antineoplastic radiation therapy: Secondary | ICD-10-CM | POA: Insufficient documentation

## 2020-08-09 DIAGNOSIS — Z79899 Other long term (current) drug therapy: Secondary | ICD-10-CM | POA: Diagnosis not present

## 2020-08-09 DIAGNOSIS — C50412 Malignant neoplasm of upper-outer quadrant of left female breast: Secondary | ICD-10-CM | POA: Insufficient documentation

## 2020-08-09 DIAGNOSIS — Z5112 Encounter for antineoplastic immunotherapy: Secondary | ICD-10-CM | POA: Diagnosis not present

## 2020-08-10 ENCOUNTER — Encounter: Payer: Self-pay | Admitting: General Practice

## 2020-08-10 NOTE — Progress Notes (Signed)
[facility] Spiritual Care Note  Phoned for follow-up support as planned. Left voicemail encouraging return call.   [place], [place], [facility] Pager [phone] Voicemail [phone]

## 2020-08-11 ENCOUNTER — Encounter: Payer: Self-pay | Admitting: General Practice

## 2020-08-11 NOTE — Progress Notes (Signed)
[place] Spiritual Care Note  Received and returned voicemail from Lindsay [name]. Also plan to see her at her next infusion on 4/20.   [place], [place], [facility] Pager [phone] Voicemail [phone]

## 2020-08-13 DIAGNOSIS — Z5112 Encounter for antineoplastic immunotherapy: Secondary | ICD-10-CM | POA: Diagnosis not present

## 2020-08-16 ENCOUNTER — Encounter: Payer: Self-pay | Admitting: *Deleted

## 2020-08-16 ENCOUNTER — Ambulatory Visit: Payer: Medicare Other

## 2020-08-20 NOTE — Progress Notes (Signed)
[facility]   [phone] [phone]   Clinic Follow up Note   Patient Care Team: Scotty Court, DO as PCP - General Mauro Kaufmann, RN as Oncology Nurse Navigator Rockwell Germany, RN as Oncology Nurse Navigator [name], MD as Consulting Physician (General Surgery) Truitt Merle, MD as Consulting Physician (Hematology) Eppie Gibson, MD as Attending Physician (Radiation Oncology)  Date of Service:  08/25/2020  CHIEF COMPLAINT: F/uof left breastmetaplasticcancer, triple negative  SUMMARY OF ONCOLOGIC HISTORY: Oncology History Overview Note  Cancer Staging Malignant neoplasm of upper-outer quadrant of left breast in female, estrogen receptor negative ([place]) Staging form: Breast, AJCC 8th Edition - Clinical stage from 10/28/2019: Stage IIIB (cT2, cN1, cM0, G3, ER-, PR-, HER2-) - Signed by Eppie Gibson, MD on 11/05/2019 - Pathologic stage from 12/26/2019: Stage IIIC (pT3, pN3a, cM0, G3, ER-, PR-, HER2-) - Signed by Truitt Merle, MD on 01/28/2020    Malignant neoplasm of upper-outer quadrant of left breast in female, estrogen receptor negative ([place])  10/27/2019 Mammogram   IMPRESSION: 1. Right breast calcifications spanning 2.6 cm in the upper slightly medial breast are indeterminate.   2. Left breast dominant mass at 3 o'clock, 6 cm from nipple measuring 3x3.9x2.9 cm is highly suspicious. There is overlying skin thickening, skin involvement is not excluded.   3. Left breast mass/distorted tissue at 1 o'clock, 4cm from nipple measuring 3.0x1.2x2.9 cm is likely in contiguity with the dominant mass at 3 o'clock and is also highly suspicious. With the dominant lesion the overall span a disease is approximately 6 cm.   4. Left breast distortion at 2 o'clock 10 cm from the nipple is Suspicious, measuring 1.0 x 0.8 cm.   5.  Abnormal left axillary lymph nodes (2). Cortical thickness measures up to 0.6 cm.    10/28/2019 Initial Biopsy    Diagnosis 1. Breast, left, needle core biopsy, 3 o'clock, 5cmfn - INVASIVE MAMMARY CARCINOMA. 2. Breast, left, needle core biopsy, 2 o'clock, 10cmfn - INVASIVE MAMMARY CARCINOMA. 3. 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 Microscopic Comment 1. -3. Overall, immunohistochemistry favors a pronounced desmoplastic response, but metaplastic carcinoma cannot be excluded (Epithelioid component: CKAE1AE3 strong +, CK5/6 weak +). The greatest linear extent of tumor in any one core in specimen 1 is 14 mm. The greatest linear extent of tumor in any one core in specimen 2 is 16 mm.   10/28/2019 Receptors her2   3. PROGNOSTIC INDICATORS Results: IMMUNOHISTOCHEMICAL AND MORPHOMETRIC ANALYSIS PERFORMED MANUALLY The tumor cells are EQUIVOCAL for Her2 (2+). Her2 by FISH will be performed and results reported separately. Estrogen Receptor: 0%, NEGATIVE Progesterone Receptor: 0%, NEGATIVE Proliferation Marker Ki67: 10%    3. FLUORESCENCE IN-SITU HYBRIDIZATION Results: GROUP 5: HER2 **NEGATIVE** Equivocal form of amplification of the HER2 gene was detected in the IHC 2+ tissue sample received from this individual. HER2 FISH was performed by a technologist and cell imaging and analysis on the [employer].   10/28/2019 Cancer Staging   Staging form: Breast, AJCC 8th Edition - Clinical stage from 10/28/2019: Stage IIIB (cT2, cN1, cM0, G3, ER-, PR-, HER2-) - Signed by Eppie Gibson, MD on 11/05/2019   10/31/2019 Initial Diagnosis   Malignant neoplasm of upper-outer quadrant of left breast in female, estrogen receptor negative ([place])   10/31/2019 Pathology Results   Diagnosis Breast, right, needle core biopsy, UOQ, posterior - FOCAL USUAL DUCTAL HYPERPLASIA AND FIBROCYSTIC CHANGES WITH CALCIFICATIONS - FIBROADENOMATOID CHANGES - NO MALIGNANCY  IDENTIFIED Microscopic Comment These results were called to [facility] on November 03, 2019.    11/05/2019 Genetic Testing   She declined Genetic testing    11/21/2019 Imaging   CT CAP w contrast  IMPRESSION: 1. Left breast mass and prominent left axillary lymph nodes, containing biopsy marking clips, consistent with newly diagnosed breast malignancy. 2. No definite evidence of distant metastatic disease in the chest, abdomen, or pelvis. 3. There are occasional small pulmonary nodules, measuring 2-3 mm, most likely incidental sequelae of prior infection or inflammation although metastatic disease is not excluded. Attention on follow-up. 4. Hepatic steatosis. 5. Aortic Atherosclerosis (ICD10-I70.0).     11/21/2019 Imaging   Bone Scan Whole Body IMPRESSION: 1. Single focus of uptake associated with a subacute fracture of LEFT anterior fourth rib. 2. No additional areas of abnormal uptake.   12/26/2019 Cancer Staging   Staging form: Breast, AJCC 8th Edition - Pathologic stage from 12/26/2019: Stage IIIC (pT3, pN3a, cM0, G3, ER-, PR-, HER2-) - Signed by Truitt Merle, MD on 01/28/2020   12/26/2019 Surgery   LEFT MASTECTOMY MODIFIED RADICAL and PAC Placement by Dr Marlou Starks   12/26/2019 Pathology Results   FINAL MICROSCOPIC DIAGNOSIS:   A. 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  - Ductal carcinoma in situ, high nuclear grade with central necrosis.  - Margins of resection:  - Metaplastic carcinoma focally involves the anterior margin and is < 1  mm from the posterior margin.  - DCIS is < 1 mm from the anterior margin.  - Metastatic carcinoma in (13) of (16) lymph nodes with extranodal  extension.  - Biopsy clip sites in breast and one lymph node.  - See oncology table.    ADDENDUM:  PROGNOSTIC INDICATOR RESULTS:  Immunohistochemical and morphometric analysis performed manually  The tumor cells are EQUIVOCAL for Her2 (2+). Her2 FISH has been ordered  and will be reported in an  addendum.  Estrogen Receptor:       NEGATIVE  Progesterone Receptor:   NEGATIVE  Proliferation Marker Ki-67:   30%    ADDENDUM:  FLOURESCENCE IN-SITU HYBRIDIZATION RESULTS:  GROUP 5:   HER2 NEGATIVE   01/28/2020 Echocardiogram   Baseline Echo  IMPRESSIONS     1. Left ventricular ejection fraction, by estimation, is 60 to 65%. The  left ventricle has normal function. The left ventricle has no regional  wall motion abnormalities. Left ventricular diastolic parameters are  consistent with Grade I diastolic  dysfunction (impaired relaxation). The average left ventricular global  longitudinal strain is -19.8 %.   2. Right ventricular systolic function is normal. The right ventricular  size is normal. Tricuspid regurgitation signal is inadequate for assessing  PA pressure.   3. The mitral valve is normal in structure. No evidence of mitral valve  regurgitation. No evidence of mitral stenosis.   4. The aortic valve is normal in structure. Aortic valve regurgitation is  not visualized. No aortic stenosis is present.   5. The inferior vena cava is normal in size with greater than 50%  respiratory variability, suggesting right atrial pressure of 3 mmHg.    02/05/2020 -  Chemotherapy   Keytruda q3weeks (to be taken for 1 whole year) with weekly Carboplatin/Taxol for 12 weeks starting 02/05/20-05/13/20 followed by Adriamycin/Cytoxan q2weeks X4 starting 05/20/20-07/13/20.  ----Continue Keytruda q3weeks from 08/03/20 to complete 1 year of treatment from 02/05/20).    06/17/2020 Breast  US   FINDINGS: On physical exam,well-healed scars of LEFT mastectomy and LEFT axillary node dissection. I palpate no axillary mass. Ultrasound is performed, showing normal appearing LEFT axillary contents. No mass or enlarged lymph nodes. IMPRESSION: Ultrasound is negative for LEFT axillary adenopathy.   08/24/2020 -  Radiation Therapy   Adjuvant RT per Dr. Isidore Moos starting 08/24/20      CURRENT THERAPY:  1.  Continue Keytruda q3weeks from 08/03/20 to complete 1 year of treatment from 02/05/20).  2. Adjuvant RT per Dr. Isidore Moos starting 08/24/20  INTERVAL HISTORY:  Lindsay [name] is here for a follow up. She was last seen by me 3 months ago and seen by NP Lacie in interim. She presents to the clinic alone. She notes she is doing better off chemo. She notes her SOB has resolved after stopping AC. Her mouth sores were her worst side effects from [facility]. This has resolved. She lost over 40 pounds with last couple cycles of AC. She notes she has started gaining weight back over past 2 weeks with mild, mild protein.  She notes she started Radiation yesterday and has tolerated well so far. She notes appreciation for Tim for helping her directly.    REVIEW OF SYSTEMS:   Constitutional: Denies fevers, chills or abnormal weight loss Eyes: Denies blurriness of vision Ears, nose, mouth, throat, and face: Denies mucositis or sore throat Respiratory: Denies cough, dyspnea or wheezes Cardiovascular: Denies palpitation, chest discomfort or lower extremity swelling Gastrointestinal:  Denies nausea, heartburn or change in bowel habits Skin: Denies abnormal skin rashes Lymphatics: Denies new lymphadenopathy or easy bruising Neurological:Denies numbness, tingling or new weaknesses Behavioral/Psych: Mood is stable, no new changes  All other systems were reviewed with the patient and are negative.   MEDICAL HISTORY:  Past Medical History:  Diagnosis Date  . Bronchitis   . Family history of bone cancer   . Family history of breast cancer   . Family history of prostate cancer   . Fibromyalgia   . HCV (hepatitis C virus)   . MRSA (methicillin resistant Staphylococcus aureus) 2012    SURGICAL HISTORY: Past Surgical History:  Procedure Laterality Date  . MASTECTOMY MODIFIED RADICAL Left 12/26/2019   Procedure: LEFT MASTECTOMY MODIFIED RADICAL;  Surgeon: [name], MD;  Location: [place];  Service: General;   Laterality: Left;  PEC BLOCK  . PORTACATH PLACEMENT Right 12/26/2019   Procedure: INSERTION PORT-A-CATH WITH ULTRASOUND GUIDANCE;  Surgeon: [name], MD;  Location: [place];  Service: General;  Laterality: Right;    I have reviewed the social history and family history with the patient and they are unchanged from previous note.  ALLERGIES:  is allergic to codeine.  MEDICATIONS:  Current Outpatient Medications  Medication Sig Dispense Refill  . acetaminophen (TYLENOL) 650 MG CR tablet Take 650 mg by mouth every 8 (eight) hours as needed for pain.     Marland Kitchen ascorbic acid (VITAMIN C) 250 MG CHEW Chew by mouth.    . dexamethasone (DECADRON) 0.5 MG/5ML solution Take 10 ml (1 mg) swish for 2 minutes and then spit.  Do this four times daily as needed Do Not eat or drink for 1 hour after. 240 mL 3  . diphenoxylate-atropine (LOMOTIL) 2.5-0.025 MG tablet Take 1-2 tablets by mouth 4 (four) times daily as needed for diarrhea or loose stools. 30 tablet 1  . gabapentin (NEURONTIN) 300 MG capsule Take 300 mg by mouth 3 (three) times daily as needed.    . lidocaine (  XYLOCAINE) 2 % solution Use as directed 15 mLs in the mouth or throat every 4 (four) hours as needed for mouth pain. 100 mL 2  . lidocaine-prilocaine (EMLA) cream Apply 1 application topically as needed. 30 g 2  . methocarbamol (ROBAXIN) 750 MG tablet Take 1 tablet (750 mg total) by mouth 4 (four) times daily as needed (use for muscle cramps/pain). 30 tablet 2  . omeprazole (PRILOSEC) 20 MG capsule Take 1 capsule (20 mg total) by mouth daily. 30 capsule 2  . valACYclovir (VALTREX) 1000 MG tablet Take 1 tablet (1,000 mg total) by mouth 2 (two) times daily. 14 tablet 1   No current facility-administered medications for this visit.   Facility-Administered Medications Ordered in Other Visits  Medication Dose Route Frequency Provider Last Rate Last Admin  . sodium chloride flush (NS) 0.9 % injection 10 mL  10 mL Intracatheter PRN Truitt Merle, MD   10  mL at 08/25/20 1545    PHYSICAL EXAMINATION: ECOG PERFORMANCE STATUS: 1 - Symptomatic but completely ambulatory  Vitals:   08/25/20 1339  BP: (!) 148/95  Pulse: 93  Resp: 18  Temp: 97.7 F (36.5 C)  SpO2: 96%   Filed Weights   08/25/20 1339  Weight: 156 lb 4.8 oz (70.9 kg)    Due to COVID19 we will limit examination to appearance. Patient had no complaints.  GENERAL:alert, no distress and comfortable SKIN: skin color normal, no rashes or significant lesions EYES: normal, Conjunctiva are pink and non-injected, sclera clear  NEURO: alert & oriented x 3 with fluent speech   LABORATORY DATA:  I have reviewed the data as listed CBC Latest Ref Rng & Units 08/25/2020 08/03/2020 07/13/2020  WBC 4.0 - 10.5 K/uL 4.5 4.5 4.7  Hemoglobin 12.0 - 15.0 g/dL 10.8(L) 9.8(L) 10.3(L)  Hematocrit 36.0 - 46.0 % 32.3(L) 28.6(L) 30.3(L)  Platelets 150 - 400 K/uL 201 334 324     CMP Latest Ref Rng & Units 08/25/2020 08/03/2020 07/13/2020  Glucose 70 - 99 mg/dL 97 96 97  BUN 8 - 23 mg/dL 5(L) <4(L) 4(L)  Creatinine 0.44 - 1.00 mg/dL 0.60 0.63 0.60  Sodium 135 - 145 mmol/L 135 138 138  Potassium 3.5 - 5.1 mmol/L 3.9 3.5 3.6  Chloride 98 - 111 mmol/L 102 104 105  CO2 22 - 32 mmol/L _0 Calcium 8.9 - 10.3 mg/dL 8.8(L) 8.5(L) 8.5(L)  Total Protein 6.5 - 8.1 g/dL 5.8(L) 5.6(L) 5.4(L)  Total Bilirubin 0.3 - 1.2 mg/dL 0.5 0.4 0.4  Alkaline Phos 38 - 126 U/L 71 71 71  AST 15 - 41 U/L 43(H) 48(H) 43(H)  ALT 0 - 44 U/L _1 RADIOGRAPHIC STUDIES: I have personally reviewed the radiological images as listed and agreed with the findings in the report. No results found.   ASSESSMENT & PLAN:  Ahnya Akre is a 65 y.o. female with   1.Left breast Multifocal Metaplastic cancer,pT3N3aM0,including one unresectable axillary node, metaplastic carcinoma,ER-/PR-/HER2-, grade IIIC -She was diagnosed in 10/2019 with left breastmetaplasticcancer, triple negative disease metastatic to  left axillary LN. Her 11/2019 CT CAP and bone scan was negative for distant metastasis.  -She underwent left mastectomy with Dr Marlou Starks on 12/26/19.Surgical pathshowed6cm multifocal metaplastic carcinomaand13/16 positive LN but unfortunately 1 positive LN was not able to be removed given it invasion ofa vessel. -She was treated adjuvant Carboplatin/Taxol (02/05/20-05/13/20) and Adriamycin/Cytoxan (/13/22-07/13/20) along with Keytruda. She will continue Keytruda q3weeks from 08/03/20 to complete 1 year of  treatment from 02/05/20).  -To reduce her risk of local recurrence she proceeded with adjuvant radiation with Dr Isidore Moos on 08/24/20. -She tolerated last few treatments of AC poorly with mouth sores, weight loss, constipation, weakness, SOB. She is recovering well from chemotherapy and starting to gain weight back. Labs reviewed, Hg 10.8. Overall adequate to proceed with Keytruda today.  -She is interested in CT scan to evaluate her LN at some point. I discussed doing scan after her radiation and she agrees. She is not interested in re-excision with Dr Marlou Starks to remove LN.  -We also discussed the breast cancer surveillance after radiation and Keytruda. She will continue annual screening mammogram, CT Scans every 6 months for a few years, self exams, and a routine office visit with lab and exam with [place].  -F/u in 3 weeks.   2. Hepatitis C,untreated,diagnosed in [place] her to ID or [employer] for treatment after she completed chemo and radiation. -She also notes a history of MRSA which she is not sure has completed cleared. -We will watch her liver functions closely during the chemotherapy.We discussed her that she may have hepatitis C flare when she has chemo  3. Social and financial support  -She has no children and no close relatives. She does have close friends who are supportive.  -She did not have medical insurance for many years. She recently applied for blue cross and blue shield  which is not effective yet.  4. Mild anemia -Secondary to chemotherapy, mild, will continue monitoring. -Stable  PLAN: -Labs reviewed and adequate to proceed with Keytruda today  -ECHO in 5-6 weeks -Lab, flush, F/u and Keytruda in 3, 6, 9 weeks.  -continue daily radiation    No problem-specific Assessment & Plan notes found for this encounter.   Orders Placed This Encounter  Procedures  . ECHOCARDIOGRAM COMPLETE    Standing Status:   Future    Standing Expiration Date:   08/25/2021    Order Specific Question:   Where should this test be performed    Answer:   [facility]    Order Specific Question:   Perflutren DEFINITY (image enhancing agent) should be administered unless hypersensitivity or allergy exist    Answer:   Administer Perflutren    Order Specific Question:   Is a special reader required? (athlete or structural heart)    Answer:   No    Order Specific Question:   Does this study need to be read by the Structural team/Level 3 readers?    Answer:   No    Order Specific Question:   Reason for exam-Echo    Answer:   Chemo  Z09   All questions were answered. The patient knows to call the clinic with any problems, questions or concerns. No barriers to learning was detected. The total time spent in the appointment was 30 minutes.     Truitt Merle, MD 08/25/2020   I, Joslyn Devon, am acting as scribe for Truitt Merle, MD.   I have reviewed the above documentation for accuracy and completeness, and I agree with the above.

## 2020-08-23 ENCOUNTER — Ambulatory Visit: Admission: RE | Admit: 2020-08-23 | Payer: Medicare Other | Source: Ambulatory Visit | Admitting: Radiation Oncology

## 2020-08-23 ENCOUNTER — Other Ambulatory Visit: Payer: Self-pay

## 2020-08-23 ENCOUNTER — Ambulatory Visit: Payer: Medicare Other

## 2020-08-23 DIAGNOSIS — Z5112 Encounter for antineoplastic immunotherapy: Secondary | ICD-10-CM | POA: Diagnosis not present

## 2020-08-24 ENCOUNTER — Other Ambulatory Visit: Payer: Self-pay

## 2020-08-24 ENCOUNTER — Ambulatory Visit
Admission: RE | Admit: 2020-08-24 | Discharge: 2020-08-24 | Disposition: A | Discharge status: Home / Self Care | Payer: Medicare Other | Source: Ambulatory Visit | Attending: Radiation Oncology | Admitting: Radiation Oncology

## 2020-08-24 DIAGNOSIS — Z5112 Encounter for antineoplastic immunotherapy: Secondary | ICD-10-CM | POA: Diagnosis not present

## 2020-08-24 DIAGNOSIS — C50412 Malignant neoplasm of upper-outer quadrant of left female breast: Secondary | ICD-10-CM

## 2020-08-25 ENCOUNTER — Other Ambulatory Visit: Payer: Self-pay

## 2020-08-25 ENCOUNTER — Inpatient Hospital Stay (HOSPITAL_BASED_OUTPATIENT_CLINIC_OR_DEPARTMENT_OTHER): Payer: Medicare Other | Admitting: Hematology

## 2020-08-25 ENCOUNTER — Inpatient Hospital Stay: Payer: Medicare Other

## 2020-08-25 ENCOUNTER — Ambulatory Visit
Admission: RE | Admit: 2020-08-25 | Discharge: 2020-08-25 | Disposition: A | Discharge status: Home / Self Care | Payer: Medicare Other | Source: Ambulatory Visit | Attending: Radiation Oncology | Admitting: Radiation Oncology

## 2020-08-25 ENCOUNTER — Encounter: Payer: Self-pay | Admitting: Hematology

## 2020-08-25 VITALS — BP 148/95 | HR 93 | Temp 97.7°F | Resp 18 | Ht 61.0 in | Wt 156.3 lb

## 2020-08-25 DIAGNOSIS — B1920 Unspecified viral hepatitis C without hepatic coma: Secondary | ICD-10-CM | POA: Insufficient documentation

## 2020-08-25 DIAGNOSIS — Z171 Estrogen receptor negative status [ER-]: Secondary | ICD-10-CM

## 2020-08-25 DIAGNOSIS — C50412 Malignant neoplasm of upper-outer quadrant of left female breast: Secondary | ICD-10-CM

## 2020-08-25 DIAGNOSIS — D6481 Anemia due to antineoplastic chemotherapy: Secondary | ICD-10-CM | POA: Insufficient documentation

## 2020-08-25 DIAGNOSIS — Z79899 Other long term (current) drug therapy: Secondary | ICD-10-CM | POA: Insufficient documentation

## 2020-08-25 DIAGNOSIS — Z923 Personal history of irradiation: Secondary | ICD-10-CM | POA: Insufficient documentation

## 2020-08-25 DIAGNOSIS — Z8619 Personal history of other infectious and parasitic diseases: Secondary | ICD-10-CM | POA: Diagnosis not present

## 2020-08-25 DIAGNOSIS — Z5112 Encounter for antineoplastic immunotherapy: Secondary | ICD-10-CM | POA: Insufficient documentation

## 2020-08-25 DIAGNOSIS — T66XXXA Radiation sickness, unspecified, initial encounter: Secondary | ICD-10-CM

## 2020-08-25 DIAGNOSIS — Z95828 Presence of other vascular implants and grafts: Secondary | ICD-10-CM

## 2020-08-25 LAB — CMP (CANCER CENTER ONLY)
ALT: 12 U/L (ref 0–44)
AST: 43 U/L — ABNORMAL HIGH (ref 15–41)
Albumin: 3.6 g/dL (ref 3.5–5.0)
Alkaline Phosphatase: 71 U/L (ref 38–126)
Anion gap: 9 (ref 5–15)
BUN: 5 mg/dL — ABNORMAL LOW (ref 8–23)
CO2: 24 mmol/L (ref 22–32)
Calcium: 8.8 mg/dL — ABNORMAL LOW (ref 8.9–10.3)
Chloride: 102 mmol/L (ref 98–111)
Creatinine: 0.6 mg/dL (ref 0.44–1.00)
GFR, Estimated: >60 mL/min (ref >60)
Glucose, Bld: 97 mg/dL (ref 70–99)
Potassium: 3.9 mmol/L (ref 3.5–5.1)
Sodium: 135 mmol/L (ref 135–145)
Total Bilirubin: 0.5 mg/dL (ref 0.3–1.2)
Total Protein: 5.8 g/dL — ABNORMAL LOW (ref 6.5–8.1)

## 2020-08-25 LAB — CBC WITH DIFFERENTIAL (CANCER CENTER ONLY)
Abs Immature Granulocytes: 0 10*3/uL (ref 0.00–0.07)
Basophils Absolute: 0.1 10*3/uL (ref 0.0–0.1)
Basophils Relative: 1 %
Eosinophils Absolute: 0.1 10*3/uL (ref 0.0–0.5)
Eosinophils Relative: 2 %
HCT: 32.3 % — ABNORMAL LOW (ref 36.0–46.0)
Hemoglobin: 10.8 g/dL — ABNORMAL LOW (ref 12.0–15.0)
Immature Granulocytes: 0 %
Lymphocytes Relative: 29 %
Lymphs Abs: 1.3 10*3/uL (ref 0.7–4.0)
MCH: 34.8 pg — ABNORMAL HIGH (ref 26.0–34.0)
MCHC: 33.4 g/dL (ref 30.0–36.0)
MCV: 104.2 fL — ABNORMAL HIGH (ref 80.0–100.0)
Monocytes Absolute: 0.5 10*3/uL (ref 0.1–1.0)
Monocytes Relative: 10 %
Neutro Abs: 2.6 10*3/uL (ref 1.7–7.7)
Neutrophils Relative %: 58 %
Platelet Count: 201 10*3/uL (ref 150–400)
RBC: 3.1 MIL/uL — ABNORMAL LOW (ref 3.87–5.11)
RDW: 12.8 % (ref 11.5–15.5)
WBC Count: 4.5 10*3/uL (ref 4.0–10.5)
nRBC: 0 % (ref 0.0–0.2)

## 2020-08-25 MED ORDER — LIDOCAINE-PRILOCAINE 2.5-2.5 % EX CREA: 1.0000 "application " | TOPICAL_CREAM | CUTANEOUS | 2 refills | Status: DC | PRN

## 2020-08-25 MED ORDER — SODIUM CHLORIDE 0.9% FLUSH
10.0000 mL | INTRAVENOUS | Status: DC | PRN
Start: 2020-08-25 — End: 2020-08-25
  Administered 2020-08-25: 10 mL
  Filled 2020-08-25: qty 10

## 2020-08-25 MED ORDER — SODIUM CHLORIDE 0.9 % IV SOLN
200.0000 mg | Freq: Once | INTRAVENOUS | Status: AC
  Administered 2020-08-25: 200 mg via INTRAVENOUS
  Filled 2020-08-25: qty 8

## 2020-08-25 MED ORDER — SODIUM CHLORIDE 0.9 % IV SOLN
Freq: Once | INTRAVENOUS | Status: AC
  Filled 2020-08-25: qty 250

## 2020-08-25 MED ORDER — HEPARIN SOD (PORK) LOCK FLUSH 100 UNIT/ML IV SOLN
500.0000 [IU] | Freq: Once | INTRAVENOUS | Status: AC | PRN
  Administered 2020-08-25: 500 [IU]
  Filled 2020-08-25: qty 5

## 2020-08-25 MED ORDER — SODIUM CHLORIDE 0.9% FLUSH
10.0000 mL | Freq: Once | INTRAVENOUS | Status: AC
Start: 2020-08-25 — End: 2020-08-25
  Administered 2020-08-25: 10 mL
  Filled 2020-08-25: qty 10

## 2020-08-25 NOTE — Patient Instructions (Signed)

## 2020-08-26 ENCOUNTER — Telehealth: Payer: Self-pay | Admitting: Hematology

## 2020-08-26 ENCOUNTER — Ambulatory Visit
Admission: RE | Admit: 2020-08-26 | Discharge: 2020-08-26 | Disposition: A | Discharge status: Home / Self Care | Payer: Medicare Other | Source: Ambulatory Visit | Attending: Radiation Oncology | Admitting: Radiation Oncology

## 2020-08-26 DIAGNOSIS — Z5112 Encounter for antineoplastic immunotherapy: Secondary | ICD-10-CM | POA: Diagnosis not present

## 2020-08-26 LAB — TSH: TSH: 1.683 u[IU]/mL (ref 0.308–3.960)

## 2020-08-26 NOTE — Progress Notes (Signed)
Pt here for patient teaching.  Pt given Managing Acute Radiation Side Effects for Head and Neck Cancer handout, skin care instructions, Alra deodorant and Radiaplex gel.  Reviewed areas of pertinence such as fatigue, hair loss, skin changes, breast tenderness and breast swelling . Pt able to give teach back of to pat skin and use unscented/gentle soap,apply Radiaplex bid, avoid applying anything to skin within 4 hours of treatment, avoid wearing an under wire bra and to use an electric razor if they must shave. Pt verbalizes understanding of information given and will contact nursing with any questions or concerns.

## 2020-08-26 NOTE — Telephone Encounter (Signed)
Attempted to leave a message with follow-up appointment per 4/20 los. Voicemail full. Mailed calendar.

## 2020-08-27 ENCOUNTER — Other Ambulatory Visit: Payer: Self-pay

## 2020-08-27 ENCOUNTER — Ambulatory Visit
Admission: RE | Admit: 2020-08-27 | Discharge: 2020-08-27 | Disposition: A | Discharge status: Home / Self Care | Payer: Medicare Other | Source: Ambulatory Visit | Attending: Radiation Oncology | Admitting: Radiation Oncology

## 2020-08-27 DIAGNOSIS — C50412 Malignant neoplasm of upper-outer quadrant of left female breast: Secondary | ICD-10-CM

## 2020-08-27 DIAGNOSIS — Z5112 Encounter for antineoplastic immunotherapy: Secondary | ICD-10-CM | POA: Diagnosis not present

## 2020-08-27 MED ORDER — ALRA NON-METALLIC DEODORANT (RAD-ONC)
1.0000 | Freq: Once | TOPICAL | Status: AC
Start: 2020-08-27 — End: 2020-08-27
  Administered 2020-08-27: 1 via TOPICAL

## 2020-08-27 MED ORDER — RADIAPLEXRX EX GEL: Freq: Once | CUTANEOUS | Status: AC

## 2020-08-30 ENCOUNTER — Other Ambulatory Visit: Payer: Self-pay | Admitting: *Deleted

## 2020-08-30 ENCOUNTER — Other Ambulatory Visit: Payer: Self-pay

## 2020-08-30 ENCOUNTER — Ambulatory Visit
Admission: RE | Admit: 2020-08-30 | Discharge: 2020-08-30 | Disposition: A | Discharge status: Home / Self Care | Payer: Medicare Other | Source: Ambulatory Visit | Attending: Radiation Oncology | Admitting: Radiation Oncology

## 2020-08-30 ENCOUNTER — Ambulatory Visit: Payer: Medicare Other | Attending: Hematology

## 2020-08-30 DIAGNOSIS — C50412 Malignant neoplasm of upper-outer quadrant of left female breast: Secondary | ICD-10-CM | POA: Insufficient documentation

## 2020-08-30 DIAGNOSIS — Z171 Estrogen receptor negative status [ER-]: Secondary | ICD-10-CM

## 2020-08-30 DIAGNOSIS — Z5112 Encounter for antineoplastic immunotherapy: Secondary | ICD-10-CM | POA: Diagnosis not present

## 2020-08-30 DIAGNOSIS — Z170 Estrogen receptor positive status [ER+]: Secondary | ICD-10-CM | POA: Insufficient documentation

## 2020-08-30 NOTE — Therapy (Signed)
[place], [place], [address] Phone: [phone]   Fax:  [phone]  Physical Therapy Treatment  Patient Details  Name: Lindsay [name] MRN: [id number] Date of Birth: 1956/04/08 Referring Provider (PT): Dr. Autumn Messing   Encounter Date: 08/30/2020   PT End of Session - 08/30/20 1335    Visit Number 18    Number of Visits 18    PT Start Time 8250    PT Stop Time 1325    PT Time Calculation (min) 30 min    Activity Tolerance Patient tolerated treatment well    Behavior During Therapy [facility] for tasks assessed/performed           Past Medical History:  Diagnosis Date  . Bronchitis   . Family history of bone cancer   . Family history of breast cancer   . Family history of prostate cancer   . Fibromyalgia   . HCV (hepatitis C virus)   . MRSA (methicillin resistant Staphylococcus aureus) 2012    Past Surgical History:  Procedure Laterality Date  . MASTECTOMY MODIFIED RADICAL Left 12/26/2019   Procedure: LEFT MASTECTOMY MODIFIED RADICAL;  Surgeon: [name], MD;  Location: [place];  Service: General;  Laterality: Left;  PEC BLOCK  . PORTACATH PLACEMENT Right 12/26/2019   Procedure: INSERTION PORT-A-CATH WITH ULTRASOUND GUIDANCE;  Surgeon: [name], MD;  Location: [place];  Service: General;  Laterality: Right;    There were no vitals filed for this visit.   Subjective Assessment - 08/30/20 1255    Subjective Pt is wearing her sleeve today but it doesn't seem to fit too well.  It continues to slide down. She has washed her sleeve only 1x in a few months.  She notes she is here for her screen only and not a reevaluation for further treatment    Pertinent History Patient was diagnosed on 10/27/2019 with left triple negative invasive mammary carcinoma breast cancer. Patient underwent a left modified radical mastectomy on 12/26/2019 with 13 of 16 were positive for cancer. The Ki67 is 10%. There may  be some cancer attaching the LN to the visceral vein but no new diagnositics have been done.              [facility] PT Assessment - 08/30/20 0001      Assessment   Medical Diagnosis s/p left MRM    Referring Provider (PT) Dr. Autumn Messing              L-DEX FLOWSHEETS - 08/30/20 1300      L-DEX LYMPHEDEMA SCREENING   Measurement Type Unilateral    L-DEX MEASUREMENT EXTREMITY Upper Extremity    POSITION  Standing    DOMINANT SIDE Right    At Risk Side Left    BASELINE SCORE (UNILATERAL) 7.4    L-DEX SCORE (UNILATERAL) 5.1    VALUE CHANGE (UNILAT) -2.3                                  PT Long Term Goals - 04/14/20 1311      PT LONG TERM GOAL #1   Title Patient will demonstrate she has regained full shoulder ROM and function post operatively compared to baselines.    Time 4    Period Weeks    Status Achieved      PT LONG TERM GOAL #2   Title Patient will increase left  shoulder active flexion to >/= 130 degrees for increased ease reacing overhead.    Baseline 98 degrees post op; 146 pre-op    Time 4    Period Weeks    Status Achieved      PT LONG TERM GOAL #3   Title Patient will increase left shoulder active abduction to >/= 130 degrees for ability to obtain radiation positioning.    Baseline 110 degrees post op; 151 degrees pre-op    Time 4    Period Weeks    Status Achieved      PT LONG TERM GOAL #4   Title Patient will improve the Quick DASH score to be </= 10 for improved overall upper extremity function.    Baseline 27.27    Time 4    Period Weeks    Status Not Met      PT LONG TERM GOAL #5   Title Patient will verbalize good understanding of lymphedema risk reduction practices after attending the ABC class.    Time 4    Period Weeks    Status Achieved                 Plan - 08/30/20 1337    Clinical Impression Statement Pt returns today for her next SOZO screen.  Her sleeve continues to slide down and she was pulling it  up over her shoulder.  She also has not washed it in several months.  She was shown how high to pull the sleeve, and we discussed laundering to shrink it up some.  I also gave her a piece of grey foam to tuck under the band at her upper arm to see if that may prevent sliding.  Her SOZO score improved from 16.7 at last visit to 5.1 today and puts her well into the normal green area.  She was advised to continue wearing her sleeve as it is helping.  If she continues to have difficulty with sleeve sliding or rolling after trying the techniques I mentioned she was advised to call.  Next screen was set for Aug. 8, 2022    Stability/Clinical Decision Making Stable/Uncomplicated    Recommended Other Services SOZO screen every 3 months    Consulted and Agree with Plan of Care Patient           Patient will benefit from skilled therapeutic intervention in order to improve the following deficits and impairments:  Postural dysfunction,Decreased range of motion,Decreased knowledge of precautions,Impaired UE functional use,Pain,Increased fascial restricitons,Decreased scar mobility  Visit Diagnosis: Malignant neoplasm of upper-outer quadrant of left breast in female, estrogen receptor positive ([place])     Problem List Patient Active Problem List   Diagnosis Date Noted  . Chemotherapy induced neutropenia ([place]) 03/31/2020  . Port-A-Cath in place 02/04/2020  . Family history of breast cancer   . Family history of prostate cancer   . Family history of bone cancer   . Malignant neoplasm of upper-outer quadrant of left breast in female, estrogen receptor negative ([place]) 10/31/2019    Claris Pong 08/30/2020, 1:43 PM  [place], [place], [address] Phone: [phone]   Fax:  [phone]  Name: Lindsay [name] MRN: [id number] Date of Birth: 01-25-1956 Cheral Almas, PT 08/30/20 1:44 PM

## 2020-08-31 ENCOUNTER — Ambulatory Visit
Admission: RE | Admit: 2020-08-31 | Discharge: 2020-08-31 | Disposition: A | Discharge status: Home / Self Care | Payer: Medicare Other | Source: Ambulatory Visit | Attending: Radiation Oncology | Admitting: Radiation Oncology

## 2020-08-31 DIAGNOSIS — Z5112 Encounter for antineoplastic immunotherapy: Secondary | ICD-10-CM | POA: Diagnosis not present

## 2020-09-01 ENCOUNTER — Other Ambulatory Visit: Payer: Self-pay

## 2020-09-01 ENCOUNTER — Encounter: Payer: Self-pay | Admitting: General Practice

## 2020-09-01 ENCOUNTER — Ambulatory Visit
Admission: RE | Admit: 2020-09-01 | Discharge: 2020-09-01 | Disposition: A | Discharge status: Home / Self Care | Payer: Medicare Other | Source: Ambulatory Visit | Attending: Radiation Oncology | Admitting: Radiation Oncology

## 2020-09-01 DIAGNOSIS — Z5112 Encounter for antineoplastic immunotherapy: Secondary | ICD-10-CM | POA: Diagnosis not present

## 2020-09-01 NOTE — Progress Notes (Signed)
[place] Spiritual Care Note  Followed up with Ms Hoppe by phone because I missed her in her last infusion. She plans to trop by or call to set up an appointment to meet for a pastoral check-in when she is on campus for radiation treatment.   [place], [place], [facility] Pager [phone] Voicemail [phone]

## 2020-09-02 ENCOUNTER — Ambulatory Visit
Admission: RE | Admit: 2020-09-02 | Discharge: 2020-09-02 | Disposition: A | Discharge status: Home / Self Care | Payer: Medicare Other | Source: Ambulatory Visit | Attending: Radiation Oncology | Admitting: Radiation Oncology

## 2020-09-02 DIAGNOSIS — Z5112 Encounter for antineoplastic immunotherapy: Secondary | ICD-10-CM | POA: Diagnosis not present

## 2020-09-03 ENCOUNTER — Ambulatory Visit
Admission: RE | Admit: 2020-09-03 | Discharge: 2020-09-03 | Disposition: A | Discharge status: Home / Self Care | Payer: Medicare Other | Source: Ambulatory Visit | Attending: Radiation Oncology | Admitting: Radiation Oncology

## 2020-09-03 ENCOUNTER — Other Ambulatory Visit: Payer: Self-pay

## 2020-09-03 DIAGNOSIS — Z5112 Encounter for antineoplastic immunotherapy: Secondary | ICD-10-CM | POA: Diagnosis not present

## 2020-09-06 ENCOUNTER — Ambulatory Visit
Admission: RE | Admit: 2020-09-06 | Discharge: 2020-09-06 | Disposition: A | Discharge status: Home / Self Care | Payer: Medicare Other | Source: Ambulatory Visit | Attending: Radiation Oncology | Admitting: Radiation Oncology

## 2020-09-06 DIAGNOSIS — Z510 Encounter for antineoplastic radiation therapy: Secondary | ICD-10-CM | POA: Insufficient documentation

## 2020-09-06 DIAGNOSIS — Z79899 Other long term (current) drug therapy: Secondary | ICD-10-CM | POA: Diagnosis not present

## 2020-09-06 DIAGNOSIS — Z5112 Encounter for antineoplastic immunotherapy: Secondary | ICD-10-CM | POA: Diagnosis not present

## 2020-09-06 DIAGNOSIS — C50412 Malignant neoplasm of upper-outer quadrant of left female breast: Secondary | ICD-10-CM | POA: Insufficient documentation

## 2020-09-07 ENCOUNTER — Other Ambulatory Visit: Payer: Self-pay

## 2020-09-07 ENCOUNTER — Ambulatory Visit
Admission: RE | Admit: 2020-09-07 | Discharge: 2020-09-07 | Disposition: A | Discharge status: Home / Self Care | Payer: Medicare Other | Source: Ambulatory Visit | Attending: Radiation Oncology | Admitting: Radiation Oncology

## 2020-09-07 DIAGNOSIS — Z5112 Encounter for antineoplastic immunotherapy: Secondary | ICD-10-CM | POA: Diagnosis not present

## 2020-09-08 ENCOUNTER — Ambulatory Visit
Admission: RE | Admit: 2020-09-08 | Discharge: 2020-09-08 | Disposition: A | Discharge status: Home / Self Care | Payer: Medicare Other | Source: Ambulatory Visit | Attending: Radiation Oncology | Admitting: Radiation Oncology

## 2020-09-08 DIAGNOSIS — Z5112 Encounter for antineoplastic immunotherapy: Secondary | ICD-10-CM | POA: Diagnosis not present

## 2020-09-09 ENCOUNTER — Other Ambulatory Visit: Payer: Self-pay

## 2020-09-09 ENCOUNTER — Ambulatory Visit
Admission: RE | Admit: 2020-09-09 | Discharge: 2020-09-09 | Disposition: A | Discharge status: Home / Self Care | Payer: Medicare Other | Source: Ambulatory Visit | Attending: Radiation Oncology | Admitting: Radiation Oncology

## 2020-09-09 DIAGNOSIS — Z5112 Encounter for antineoplastic immunotherapy: Secondary | ICD-10-CM | POA: Diagnosis not present

## 2020-09-10 ENCOUNTER — Ambulatory Visit
Admission: RE | Admit: 2020-09-10 | Discharge: 2020-09-10 | Disposition: A | Discharge status: Home / Self Care | Payer: Medicare Other | Source: Ambulatory Visit | Attending: Radiation Oncology | Admitting: Radiation Oncology

## 2020-09-10 DIAGNOSIS — Z5112 Encounter for antineoplastic immunotherapy: Secondary | ICD-10-CM | POA: Diagnosis not present

## 2020-09-13 ENCOUNTER — Ambulatory Visit
Admission: RE | Admit: 2020-09-13 | Discharge: 2020-09-13 | Disposition: A | Discharge status: Home / Self Care | Payer: Medicare Other | Source: Ambulatory Visit | Attending: Radiation Oncology | Admitting: Radiation Oncology

## 2020-09-13 ENCOUNTER — Other Ambulatory Visit: Payer: Self-pay | Admitting: Hematology

## 2020-09-13 ENCOUNTER — Other Ambulatory Visit: Payer: Self-pay

## 2020-09-13 DIAGNOSIS — Z5112 Encounter for antineoplastic immunotherapy: Secondary | ICD-10-CM | POA: Diagnosis not present

## 2020-09-13 MED ORDER — DOXYCYCLINE HYCLATE 100 MG PO TABS: 200.0000 mg | ORAL_TABLET | Freq: Once | ORAL | 0 refills | Status: AC

## 2020-09-14 ENCOUNTER — Encounter: Payer: Self-pay | Admitting: Nurse Practitioner

## 2020-09-14 ENCOUNTER — Inpatient Hospital Stay (HOSPITAL_BASED_OUTPATIENT_CLINIC_OR_DEPARTMENT_OTHER): Payer: Medicare Other | Admitting: Nurse Practitioner

## 2020-09-14 ENCOUNTER — Other Ambulatory Visit: Payer: Medicare Other

## 2020-09-14 ENCOUNTER — Ambulatory Visit
Admission: RE | Admit: 2020-09-14 | Discharge: 2020-09-14 | Disposition: A | Discharge status: Home / Self Care | Payer: Medicare Other | Source: Ambulatory Visit | Attending: Radiation Oncology | Admitting: Radiation Oncology

## 2020-09-14 ENCOUNTER — Inpatient Hospital Stay: Payer: Medicare Other

## 2020-09-14 VITALS — BP 123/69 | HR 77 | Temp 97.8°F | Resp 18 | Ht 61.0 in | Wt 152.0 lb

## 2020-09-14 DIAGNOSIS — B1920 Unspecified viral hepatitis C without hepatic coma: Secondary | ICD-10-CM | POA: Insufficient documentation

## 2020-09-14 DIAGNOSIS — Z95828 Presence of other vascular implants and grafts: Secondary | ICD-10-CM

## 2020-09-14 DIAGNOSIS — R634 Abnormal weight loss: Secondary | ICD-10-CM | POA: Insufficient documentation

## 2020-09-14 DIAGNOSIS — C50412 Malignant neoplasm of upper-outer quadrant of left female breast: Secondary | ICD-10-CM

## 2020-09-14 DIAGNOSIS — C773 Secondary and unspecified malignant neoplasm of axilla and upper limb lymph nodes: Secondary | ICD-10-CM | POA: Insufficient documentation

## 2020-09-14 DIAGNOSIS — Z5112 Encounter for antineoplastic immunotherapy: Secondary | ICD-10-CM | POA: Insufficient documentation

## 2020-09-14 DIAGNOSIS — Z8614 Personal history of Methicillin resistant Staphylococcus aureus infection: Secondary | ICD-10-CM | POA: Insufficient documentation

## 2020-09-14 DIAGNOSIS — Z171 Estrogen receptor negative status [ER-]: Secondary | ICD-10-CM

## 2020-09-14 DIAGNOSIS — D6481 Anemia due to antineoplastic chemotherapy: Secondary | ICD-10-CM | POA: Insufficient documentation

## 2020-09-14 DIAGNOSIS — W57XXXA Bitten or stung by nonvenomous insect and other nonvenomous arthropods, initial encounter: Secondary | ICD-10-CM

## 2020-09-14 DIAGNOSIS — S40862A Insect bite (nonvenomous) of left upper arm, initial encounter: Secondary | ICD-10-CM

## 2020-09-14 LAB — CMP (CANCER CENTER ONLY)
ALT: 12 U/L (ref 0–44)
AST: 42 U/L — ABNORMAL HIGH (ref 15–41)
Albumin: 3.3 g/dL — ABNORMAL LOW (ref 3.5–5.0)
Alkaline Phosphatase: 71 U/L (ref 38–126)
Anion gap: 6 (ref 5–15)
BUN: 10 mg/dL (ref 8–23)
CO2: 27 mmol/L (ref 22–32)
Calcium: 8.8 mg/dL — ABNORMAL LOW (ref 8.9–10.3)
Chloride: 104 mmol/L (ref 98–111)
Creatinine: 0.63 mg/dL (ref 0.44–1.00)
GFR, Estimated: >60 mL/min (ref >60)
Glucose, Bld: 100 mg/dL — ABNORMAL HIGH (ref 70–99)
Potassium: 4 mmol/L (ref 3.5–5.1)
Sodium: 137 mmol/L (ref 135–145)
Total Bilirubin: 0.4 mg/dL (ref 0.3–1.2)
Total Protein: 5.4 g/dL — ABNORMAL LOW (ref 6.5–8.1)

## 2020-09-14 LAB — CBC WITH DIFFERENTIAL (CANCER CENTER ONLY)
Abs Immature Granulocytes: 0.01 10*3/uL (ref 0.00–0.07)
Basophils Absolute: 0 10*3/uL (ref 0.0–0.1)
Basophils Relative: 1 %
Eosinophils Absolute: 0.1 10*3/uL (ref 0.0–0.5)
Eosinophils Relative: 3 %
HCT: 32.5 % — ABNORMAL LOW (ref 36.0–46.0)
Hemoglobin: 10.9 g/dL — ABNORMAL LOW (ref 12.0–15.0)
Immature Granulocytes: 0 %
Lymphocytes Relative: 38 %
Lymphs Abs: 1.6 10*3/uL (ref 0.7–4.0)
MCH: 35 pg — ABNORMAL HIGH (ref 26.0–34.0)
MCHC: 33.5 g/dL (ref 30.0–36.0)
MCV: 104.5 fL — ABNORMAL HIGH (ref 80.0–100.0)
Monocytes Absolute: 0.5 10*3/uL (ref 0.1–1.0)
Monocytes Relative: 11 %
Neutro Abs: 2 10*3/uL (ref 1.7–7.7)
Neutrophils Relative %: 47 %
Platelet Count: 150 10*3/uL (ref 150–400)
RBC: 3.11 MIL/uL — ABNORMAL LOW (ref 3.87–5.11)
RDW: 11.9 % (ref 11.5–15.5)
WBC Count: 4.3 10*3/uL (ref 4.0–10.5)
nRBC: 0 % (ref 0.0–0.2)

## 2020-09-14 MED ORDER — HEPARIN SOD (PORK) LOCK FLUSH 100 UNIT/ML IV SOLN
500.0000 [IU] | Freq: Once | INTRAVENOUS | Status: AC | PRN
  Administered 2020-09-14: 500 [IU]
  Filled 2020-09-14: qty 5

## 2020-09-14 MED ORDER — SODIUM CHLORIDE 0.9 % IV SOLN
200.0000 mg | Freq: Once | INTRAVENOUS | Status: AC
  Administered 2020-09-14: 200 mg via INTRAVENOUS
  Filled 2020-09-14: qty 8

## 2020-09-14 MED ORDER — SODIUM CHLORIDE 0.9% FLUSH
10.0000 mL | Freq: Once | INTRAVENOUS | Status: AC
  Administered 2020-09-14: 10 mL
  Filled 2020-09-14: qty 10

## 2020-09-14 MED ORDER — SODIUM CHLORIDE 0.9 % IV SOLN
Freq: Once | INTRAVENOUS | Status: AC
  Filled 2020-09-14: qty 250

## 2020-09-14 MED ORDER — SODIUM CHLORIDE 0.9% FLUSH
10.0000 mL | INTRAVENOUS | Status: DC | PRN
  Administered 2020-09-14: 10 mL
  Filled 2020-09-14: qty 10

## 2020-09-14 NOTE — Patient Instructions (Signed)
[facility]  Discharge Instructions: ?Thank you for choosing [facility] to provide your oncology and hematology care.  ? ?If you have a lab appointment with the Cancer Center, please go directly to the Cancer Center and check in at the registration area. ?  ?Wear comfortable clothing and clothing appropriate for easy access to any Portacath or PICC line.  ? ?We strive to give you quality time with your provider. You may need to reschedule your appointment if you arrive late (15 or more minutes).  Arriving late affects you and other patients whose appointments are after yours.  Also, if you miss three or more appointments without notifying the office, you may be dismissed from the clinic at the provider?s discretion.    ?  ?For prescription refill requests, have your pharmacy contact our office and allow 72 hours for refills to be completed.   ? ?Today you received the following chemotherapy and/or immunotherapy agents: Keytruda ?  ?To help prevent nausea and vomiting after your treatment, we encourage you to take your nausea medication as directed. ? ?BELOW ARE SYMPTOMS THAT SHOULD BE REPORTED IMMEDIATELY: ?*FEVER GREATER THAN 100.4 F (38 ?C) OR HIGHER ?*CHILLS OR SWEATING ?*NAUSEA AND VOMITING THAT IS NOT CONTROLLED WITH YOUR NAUSEA MEDICATION ?*UNUSUAL SHORTNESS OF BREATH ?*UNUSUAL BRUISING OR BLEEDING ?*URINARY PROBLEMS (pain or burning when urinating, or frequent urination) ?*BOWEL PROBLEMS (unusual diarrhea, constipation, pain near the anus) ?TENDERNESS IN MOUTH AND THROAT WITH OR WITHOUT PRESENCE OF ULCERS (sore throat, sores in mouth, or a toothache) ?UNUSUAL RASH, SWELLING OR PAIN  ?UNUSUAL VAGINAL DISCHARGE OR ITCHING  ? ?Items with * indicate a potential emergency and should be followed up as soon as possible or go to the Emergency Department if any problems should occur. ? ?Please show the CHEMOTHERAPY ALERT CARD or IMMUNOTHERAPY ALERT CARD at check-in to the  Emergency Department and triage nurse. ? ?Should you have questions after your visit or need to cancel or reschedule your appointment, please contact [facility]  Dept: [phone]  and follow the prompts.  Office hours are 8:00 a.m. to 4:30 p.m. Monday - Friday. Please note that voicemails left after 4:00 p.m. may not be returned until the following business day.  We are closed weekends and major holidays. You have access to a nurse at all times for urgent questions. Please call the main number to the clinic Dept: [phone] and follow the prompts. ? ? ?For any non-urgent questions, you may also contact your provider using MyChart. We now offer e-Visits for anyone 18 and older to request care online for non-urgent symptoms. For details visit [email or web address]. ?  ?Also download the MyChart app! Go to the app store, search "MyChart", open the app, select [facility], and log in with your MyChart username and password. ? ?Due to Covid, a mask is required upon entering the hospital/clinic. If you do not have a mask, one will be given to you upon arrival. For doctor visits, patients may have 1 support person aged 18 or older with them. For treatment visits, patients cannot have anyone with them due to current Covid guidelines and our immunocompromised population.  ? ?

## 2020-09-14 NOTE — Progress Notes (Signed)
[facility]   Telephone:(336) [phone] Fax:(336) [phone]   Clinic Follow up Note   Patient Care Team: Scotty Court, DO as PCP - General Mauro Kaufmann, RN as Oncology Nurse Navigator Rockwell Germany, RN as Oncology Nurse Navigator [name], MD as Consulting Physician (General Surgery) Truitt Merle, MD as Consulting Physician (Hematology) Eppie Gibson, MD as Attending Physician (Radiation Oncology) 09/14/2020  CHIEF COMPLAINT: F/up left breast metaplastic cancer, triple negative   SUMMARY OF ONCOLOGIC HISTORY: Oncology History Overview Note  Cancer Staging Malignant neoplasm of upper-outer quadrant of left breast in female, estrogen receptor negative ([place]) Staging form: Breast, AJCC 8th Edition - Clinical stage from 10/28/2019: Stage IIIB (cT2, cN1, cM0, G3, ER-, PR-, HER2-) - Signed by Eppie Gibson, MD on 11/05/2019 - Pathologic stage from 12/26/2019: Stage IIIC (pT3, pN3a, cM0, G3, ER-, PR-, HER2-) - Signed by Truitt Merle, MD on 01/28/2020    Malignant neoplasm of upper-outer quadrant of left breast in female, estrogen receptor negative ([place])  10/27/2019 Mammogram   IMPRESSION: 1. Right breast calcifications spanning 2.6 cm in the upper slightly medial breast are indeterminate.   2. Left breast dominant mass at 3 o'clock, 6 cm from nipple measuring 3x3.9x2.9 cm is highly suspicious. There is overlying skin thickening, skin involvement is not excluded.   3. Left breast mass/distorted tissue at 1 o'clock, 4cm from nipple measuring 3.0x1.2x2.9 cm is likely in contiguity with the dominant mass at 3 o'clock and is also highly suspicious. With the dominant lesion the overall span a disease is approximately 6 cm.   4. Left breast distortion at 2 o'clock 10 cm from the nipple is Suspicious, measuring 1.0 x 0.8 cm.   5.  Abnormal left axillary lymph nodes (2). Cortical thickness measures up to 0.6 cm.    10/28/2019 Initial Biopsy   Diagnosis 1. Breast,  left, needle core biopsy, 3 o'clock, 5cmfn - INVASIVE MAMMARY CARCINOMA. 2. Breast, left, needle core biopsy, 2 o'clock, 10cmfn - INVASIVE MAMMARY CARCINOMA. 3. 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 Microscopic Comment 1. -3. Overall, immunohistochemistry favors a pronounced desmoplastic response, but metaplastic carcinoma cannot be excluded (Epithelioid component: CKAE1AE3 strong +, CK5/6 weak +). The greatest linear extent of tumor in any one core in specimen 1 is 14 mm. The greatest linear extent of tumor in any one core in specimen 2 is 16 mm.   10/28/2019 Receptors her2   3. PROGNOSTIC INDICATORS Results: IMMUNOHISTOCHEMICAL AND MORPHOMETRIC ANALYSIS PERFORMED MANUALLY The tumor cells are EQUIVOCAL for Her2 (2+). Her2 by FISH will be performed and results reported separately. Estrogen Receptor: 0%, NEGATIVE Progesterone Receptor: 0%, NEGATIVE Proliferation Marker Ki67: 10%    3. FLUORESCENCE IN-SITU HYBRIDIZATION Results: GROUP 5: HER2 **NEGATIVE** Equivocal form of amplification of the HER2 gene was detected in the IHC 2+ tissue sample received from this individual. HER2 FISH was performed by a technologist and cell imaging and analysis on the [employer].   10/28/2019 Cancer Staging   Staging form: Breast, AJCC 8th Edition - Clinical stage from 10/28/2019: Stage IIIB (cT2, cN1, cM0, G3, ER-, PR-, HER2-) - Signed by Eppie Gibson, MD on 11/05/2019   10/31/2019 Initial Diagnosis   Malignant neoplasm of upper-outer quadrant of left breast in female, estrogen receptor negative ([place])   10/31/2019 Pathology Results   Diagnosis Breast, right, needle core biopsy, UOQ, posterior - FOCAL USUAL DUCTAL HYPERPLASIA AND FIBROCYSTIC CHANGES WITH CALCIFICATIONS - FIBROADENOMATOID CHANGES - NO MALIGNANCY IDENTIFIED  Microscopic Comment These results were called to [facility] on November 03, 2019.   11/05/2019 Genetic  Testing   She declined Genetic testing    11/21/2019 Imaging   CT CAP w contrast  IMPRESSION: 1. Left breast mass and prominent left axillary lymph nodes, containing biopsy marking clips, consistent with newly diagnosed breast malignancy. 2. No definite evidence of distant metastatic disease in the chest, abdomen, or pelvis. 3. There are occasional small pulmonary nodules, measuring 2-3 mm, most likely incidental sequelae of prior infection or inflammation although metastatic disease is not excluded. Attention on follow-up. 4. Hepatic steatosis. 5. Aortic Atherosclerosis (ICD10-I70.0).     11/21/2019 Imaging   Bone Scan Whole Body IMPRESSION: 1. Single focus of uptake associated with a subacute fracture of LEFT anterior fourth rib. 2. No additional areas of abnormal uptake.   12/26/2019 Cancer Staging   Staging form: Breast, AJCC 8th Edition - Pathologic stage from 12/26/2019: Stage IIIC (pT3, pN3a, cM0, G3, ER-, PR-, HER2-) - Signed by Truitt Merle, MD on 01/28/2020   12/26/2019 Surgery   LEFT MASTECTOMY MODIFIED RADICAL and PAC Placement by Dr Marlou Starks   12/26/2019 Pathology Results   FINAL MICROSCOPIC DIAGNOSIS:   A. 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  - Ductal carcinoma in situ, high nuclear grade with central necrosis.  - Margins of resection:  - Metaplastic carcinoma focally involves the anterior margin and is < 1  mm from the posterior margin.  - DCIS is < 1 mm from the anterior margin.  - Metastatic carcinoma in (13) of (16) lymph nodes with extranodal  extension.  - Biopsy clip sites in breast and one lymph node.  - See oncology table.    ADDENDUM:  PROGNOSTIC INDICATOR RESULTS:  Immunohistochemical and morphometric analysis performed manually  The tumor cells are EQUIVOCAL for Her2 (2+). Her2 FISH has been ordered  and will be reported in an addendum.  Estrogen  Receptor:       NEGATIVE  Progesterone Receptor:   NEGATIVE  Proliferation Marker Ki-67:   30%    ADDENDUM:  FLOURESCENCE IN-SITU HYBRIDIZATION RESULTS:  GROUP 5:   HER2 NEGATIVE   01/28/2020 Echocardiogram   Baseline Echo  IMPRESSIONS     1. Left ventricular ejection fraction, by estimation, is 60 to 65%. The  left ventricle has normal function. The left ventricle has no regional  wall motion abnormalities. Left ventricular diastolic parameters are  consistent with Grade I diastolic  dysfunction (impaired relaxation). The average left ventricular global  longitudinal strain is -19.8 %.   2. Right ventricular systolic function is normal. The right ventricular  size is normal. Tricuspid regurgitation signal is inadequate for assessing  PA pressure.   3. The mitral valve is normal in structure. No evidence of mitral valve  regurgitation. No evidence of mitral stenosis.   4. The aortic valve is normal in structure. Aortic valve regurgitation is  not visualized. No aortic stenosis is present.   5. The inferior vena cava is normal in size with greater than 50%  respiratory variability, suggesting right atrial pressure of 3 mmHg.    02/05/2020 -  Chemotherapy   Keytruda q3weeks (to be taken for 1 whole year) with weekly Carboplatin/Taxol for 12 weeks starting 02/05/20-05/13/20 followed by Adriamycin/Cytoxan q2weeks X4 starting 05/20/20-07/13/20.  ----Continue Keytruda q3weeks from 08/03/20 to complete 1 year of treatment from 02/05/20).    06/17/2020 Breast US  FINDINGS: On physical exam,well-healed scars of LEFT mastectomy and LEFT axillary node dissection. I palpate no axillary mass. Ultrasound is performed, showing normal appearing LEFT axillary contents. No mass or enlarged lymph nodes. IMPRESSION: Ultrasound is negative for LEFT axillary adenopathy.   08/24/2020 -  Radiation Therapy   Adjuvant RT per Dr. Isidore Moos starting 08/24/20     CURRENT THERAPY: 1. Continue Keytruda q3weeks  from 08/03/20 to complete 1 year of treatment from 02/05/20).  2. Adjuvant RT per Dr. Claris Pong 08/24/20  INTERVAL HISTORY: Lindsay [name] returns for follow up as scheduled. She was last seen 4/20 and completed another cycle of Keytruda. She continues RT, skin becoming little red on the chest wall and axilla. She noticed a bump in the left axilla on 5/6, left it alone because she thought it was a skin tag, she has multiple of those. Dr. Isidore Moos realized it is a tick and removed 09/13/20. She also has "pimple" to right ear lobe and right medial knee like when she had MRSA before. She has applied saline soaked gauze and areas are drying up. She denies fever, chills, rash, cough, chest pain, dyspnea, muscle aches, weakness, stiffness, headaches, or other concerns.   Her appetite and taste are improving. Feels better in general with more energy. She mowed lawn for 3 hours yesterday, has allergy cough and clear rhinorrhea from that. Had 1 episode of diarrhea after eating lettuce. All other systems were reviewed with the patient and are negative.  MEDICAL HISTORY:  Past Medical History:  Diagnosis Date  . Bronchitis   . Family history of bone cancer   . Family history of breast cancer   . Family history of prostate cancer   . Fibromyalgia   . HCV (hepatitis C virus)   . MRSA (methicillin resistant Staphylococcus aureus) 2012    SURGICAL HISTORY: Past Surgical History:  Procedure Laterality Date  . MASTECTOMY MODIFIED RADICAL Left 12/26/2019   Procedure: LEFT MASTECTOMY MODIFIED RADICAL;  Surgeon: [name], MD;  Location: [facility];  Service: General;  Laterality: Left;  PEC BLOCK  . PORTACATH PLACEMENT Right 12/26/2019   Procedure: INSERTION PORT-A-CATH WITH ULTRASOUND GUIDANCE;  Surgeon: [name], MD;  Location: [facility];  Service: General;  Laterality: Right;    I have reviewed the social history and family history with the patient and they are unchanged from previous note.  ALLERGIES:  is  allergic to codeine.  MEDICATIONS:  Current Outpatient Medications  Medication Sig Dispense Refill  . acetaminophen (TYLENOL) 650 MG CR tablet Take 650 mg by mouth every 8 (eight) hours as needed for pain.     Marland Kitchen ascorbic acid (VITAMIN C) 250 MG CHEW Chew by mouth.    . dexamethasone (DECADRON) 0.5 MG/5ML solution Take 10 ml (1 mg) swish for 2 minutes and then spit.  Do this four times daily as needed Do Not eat or drink for 1 hour after. 240 mL 3  . diphenoxylate-atropine (LOMOTIL) 2.5-0.025 MG tablet Take 1-2 tablets by mouth 4 (four) times daily as needed for diarrhea or loose stools. 30 tablet 1  . gabapentin (NEURONTIN) 300 MG capsule Take 300 mg by mouth 3 (three) times daily as needed.    . lidocaine (XYLOCAINE) 2 % solution Use as directed 15 mLs in the mouth or throat every 4 (four) hours as needed for mouth pain. 100 mL 2  . lidocaine-prilocaine (EMLA) cream Apply 1 application topically as needed. 30 g 2  . methocarbamol (ROBAXIN) 750 MG tablet Take 1  tablet (750 mg total) by mouth 4 (four) times daily as needed (use for muscle cramps/pain). 30 tablet 2  . omeprazole (PRILOSEC) 20 MG capsule Take 1 capsule (20 mg total) by mouth daily. 30 capsule 2  . valACYclovir (VALTREX) 1000 MG tablet Take 1 tablet (1,000 mg total) by mouth 2 (two) times daily. 14 tablet 1   No current facility-administered medications for this visit.    PHYSICAL EXAMINATION: ECOG PERFORMANCE STATUS: 1 - Symptomatic but completely ambulatory  Vitals:   09/14/20 1057  BP: 123/69  Pulse: 77  Resp: 18  Temp: 97.8 F (36.6 C)  SpO2: 100%   Filed Weights   09/14/20 1057  Weight: 152 lb (68.9 kg)    GENERAL:alert, no distress and comfortable SKIN: small lesions in LUE/axilla and right medial knee/popliteal area with mild central erythema. No bulls eye rash or vesicles, no surrounding erythema, edema, drainage. Right ear lobe covered with gauze EYES: sclera clear LUNGS:  normal breathing effort HEART:  no lower extremity edema NEURO: alert & oriented x 3 with fluent speech, no focal motor/sensory deficits S/p left mastectomy, incisions completely healed. Mild erythema to left chest wall and axilla from RT PAC without erythema   LABORATORY DATA:  I have reviewed the data as listed CBC Latest Ref Rng & Units 09/14/2020 08/25/2020 08/03/2020  WBC 4.0 - 10.5 K/uL 4.3 4.5 4.5  Hemoglobin 12.0 - 15.0 g/dL 10.9(L) 10.8(L) 9.8(L)  Hematocrit 36.0 - 46.0 % 32.5(L) 32.3(L) 28.6(L)  Platelets 150 - 400 K/uL 150 201 334     CMP Latest Ref Rng & Units 09/14/2020 08/25/2020 08/03/2020  Glucose 70 - 99 mg/dL 100(H) 97 96  BUN 8 - 23 mg/dL 10 5(L) <4(L)  Creatinine 0.44 - 1.00 mg/dL 0.63 0.60 0.63  Sodium 135 - 145 mmol/L 137 135 138  Potassium 3.5 - 5.1 mmol/L 4.0 3.9 3.5  Chloride 98 - 111 mmol/L 104 102 104  CO2 22 - 32 mmol/L '27 24 24  ' Calcium 8.9 - 10.3 mg/dL 8.8(L) 8.8(L) 8.5(L)  Total Protein 6.5 - 8.1 g/dL 5.4(L) 5.8(L) 5.6(L)  Total Bilirubin 0.3 - 1.2 mg/dL 0.4 0.5 0.4  Alkaline Phos 38 - 126 U/L 71 71 71  AST 15 - 41 U/L 42(H) 43(H) 48(H)  ALT 0 - 44 U/L '12 12 13      ' RADIOGRAPHIC STUDIES: I have personally reviewed the radiological images as listed and agreed with the findings in the report. No results found.   ASSESSMENT & PLAN: Lindsay [name] Byrumis a 65 y.o.femalewith  1.Left breast Multifocal Metaplastic cancer,pT3N3aM0,including one unresectable axillary node, metaplastic carcinoma,ER-/PR-/HER2-, grade IIIC -She was diagnosed in 10/2019 with left breastmetaplasticcancer, triple negative disease metastatic to left axillary LN. Her 11/2019 CT CAP and bone scan was negative for distant metastasis.  -S/pleft mastectomy with Dr Marlou Starks on 12/26/19.Surgical pathshowed6cm multifocal metaplastic carcinomaand13/16 positive LN but unfortunately 1 positive LN was not able to be removeddue to vascularinvasion. -She startedadjuvant chemo with Keytrudaq3weeks(to be taken  for 1 whole year)withweekly Carboplatin/Taxolfor 12 weeksbeginning9/30/21- 1/6/22followed byAC q2 weeks x4starting 05/20/20 - 07/13/20. She tolerated AC poorly with mucositis, weight loss, constipation, and weakness. She is recovering well -interim left axillary US2/10/22 isnegative for adenopathy, c/w response to adjuvant chemo -on pembrolizumab q3 weeks to complete 1 year from 01/2020.  -She began adjuvant RT on 08/24/20. Tolerating well so far -plan to repeat CT/bone scan after RT then q 6 months for a few years on surveillance -Ms. Proctor appears stable. She tolerates single agent pembro  well overall. Able to function well, PS improved off chemo. Labs stable to proceed with pembro today as planned.  -F/up in 3 weeks with next cycle, continue RT  2. Tick bite and h/o MRSA -She recently had a tick attacked for 3 days, removed 09/13/20. No sign of EM today -she has 2 areas on right ear lobe and behind knee that appear similar to h/o MRSA -she is applying saline and keeping clean, already drying out  -no signs of systemic infection, we reviewed s/sx. I gave her printed article on Lyme disease and what to monitor. No indication for serologic testing for now -Dr. Burr Medico ordered prophylactic doxy 200 mg x1 po now, she will do it once she leaves [facility] -monitoring, she will notify with new symptoms or concerns   3. G3 mucositis and weight loss -she developed mucositis after cycle 2 AC and lost 8 lbs. However, she has lost close to 20 lbs on [facility] and 30 lbs since starting chemo in 01/2020 -declined appetite stimulant or dietician.  -continue oral supportive care  -mucositis resolved after completing AC, weight is fluctuating now  4. Hepatitis C,untreated,diagnosed in 1992 -pending referralto ID or [facility] Drazek for treatment after she completed chemo. -monitoring LFTs and for possible hep C flare while on chemo/immunotherapy -AST became elevated in 06/2020, 42-49 range, stable  5. Social and  financial support  -She has no children and no close relatives. She does have close friendsand church communitywho are supportiveand strong faith.  -She did not have medical insurance for many years. She recently applied for blue cross and blue shield which is not effective yet.  6. Mild anemia -Secondary to chemotherapy, mild, will continue monitoring. -Stable to slightly improved, Hgb 10.9 today  PLAN: -Labs reviewed -Proceed with pembrolizumab today as planned, q3 weeks to complete 1 year in 01/2021 -Continue RT -Take doxy 200 mg po x1 today for MRSA and lyme prophylaxis  -Lyme material given to patient, she will monitor symptoms  -F/up in 3 weeks with next cycle pembro  All questions were answered. The patient knows to call the clinic with any problems, questions or concerns. No barriers to learning were detected. Total encounter time was 30 minutes.      Alla Feeling, NP 09/14/20

## 2020-09-15 ENCOUNTER — Telehealth: Payer: Self-pay | Admitting: Hematology

## 2020-09-15 ENCOUNTER — Ambulatory Visit
Admission: RE | Admit: 2020-09-15 | Discharge: 2020-09-15 | Disposition: A | Discharge status: Home / Self Care | Payer: Medicare Other | Source: Ambulatory Visit | Attending: Radiation Oncology | Admitting: Radiation Oncology

## 2020-09-15 ENCOUNTER — Other Ambulatory Visit: Payer: Self-pay

## 2020-09-15 DIAGNOSIS — Z5112 Encounter for antineoplastic immunotherapy: Secondary | ICD-10-CM | POA: Diagnosis not present

## 2020-09-15 NOTE — Telephone Encounter (Signed)
Left message with follow-up appointment per 5/10 los. Gave option to call back to reschedule if needed. 

## 2020-09-16 ENCOUNTER — Ambulatory Visit
Admission: RE | Admit: 2020-09-16 | Discharge: 2020-09-16 | Disposition: A | Discharge status: Home / Self Care | Payer: Medicare Other | Source: Ambulatory Visit | Attending: Radiation Oncology | Admitting: Radiation Oncology

## 2020-09-16 DIAGNOSIS — Z5112 Encounter for antineoplastic immunotherapy: Secondary | ICD-10-CM | POA: Diagnosis not present

## 2020-09-17 ENCOUNTER — Ambulatory Visit
Admission: RE | Admit: 2020-09-17 | Discharge: 2020-09-17 | Disposition: A | Discharge status: Home / Self Care | Payer: Medicare Other | Source: Ambulatory Visit | Attending: Radiation Oncology | Admitting: Radiation Oncology

## 2020-09-17 ENCOUNTER — Other Ambulatory Visit: Payer: Self-pay

## 2020-09-17 DIAGNOSIS — Z5112 Encounter for antineoplastic immunotherapy: Secondary | ICD-10-CM | POA: Diagnosis not present

## 2020-09-20 ENCOUNTER — Ambulatory Visit
Admission: RE | Admit: 2020-09-20 | Discharge: 2020-09-20 | Disposition: A | Discharge status: Home / Self Care | Payer: Medicare Other | Source: Ambulatory Visit | Attending: Radiation Oncology | Admitting: Radiation Oncology

## 2020-09-20 ENCOUNTER — Ambulatory Visit: Payer: Medicare Other | Admitting: Radiation Oncology

## 2020-09-20 ENCOUNTER — Other Ambulatory Visit: Payer: Self-pay

## 2020-09-20 DIAGNOSIS — Z5112 Encounter for antineoplastic immunotherapy: Secondary | ICD-10-CM | POA: Diagnosis not present

## 2020-09-21 ENCOUNTER — Ambulatory Visit
Admission: RE | Admit: 2020-09-21 | Discharge: 2020-09-21 | Disposition: A | Discharge status: Home / Self Care | Payer: Medicare Other | Source: Ambulatory Visit | Attending: Radiation Oncology | Admitting: Radiation Oncology

## 2020-09-21 DIAGNOSIS — Z5112 Encounter for antineoplastic immunotherapy: Secondary | ICD-10-CM | POA: Diagnosis not present

## 2020-09-22 ENCOUNTER — Ambulatory Visit
Admission: RE | Admit: 2020-09-22 | Discharge: 2020-09-22 | Disposition: A | Discharge status: Home / Self Care | Payer: Medicare Other | Source: Ambulatory Visit | Attending: Radiation Oncology | Admitting: Radiation Oncology

## 2020-09-22 ENCOUNTER — Other Ambulatory Visit: Payer: Self-pay

## 2020-09-22 DIAGNOSIS — Z5112 Encounter for antineoplastic immunotherapy: Secondary | ICD-10-CM | POA: Diagnosis not present

## 2020-09-23 ENCOUNTER — Other Ambulatory Visit: Payer: Self-pay

## 2020-09-23 ENCOUNTER — Ambulatory Visit
Admission: RE | Admit: 2020-09-23 | Discharge: 2020-09-23 | Disposition: A | Discharge status: Home / Self Care | Payer: Medicare Other | Source: Ambulatory Visit | Attending: Radiation Oncology | Admitting: Radiation Oncology

## 2020-09-23 DIAGNOSIS — Z5112 Encounter for antineoplastic immunotherapy: Secondary | ICD-10-CM | POA: Diagnosis not present

## 2020-09-24 ENCOUNTER — Ambulatory Visit
Admission: RE | Admit: 2020-09-24 | Discharge: 2020-09-24 | Disposition: A | Discharge status: Home / Self Care | Payer: Medicare Other | Source: Ambulatory Visit | Attending: Radiation Oncology | Admitting: Radiation Oncology

## 2020-09-24 DIAGNOSIS — Z5112 Encounter for antineoplastic immunotherapy: Secondary | ICD-10-CM | POA: Diagnosis not present

## 2020-09-27 ENCOUNTER — Ambulatory Visit
Admission: RE | Admit: 2020-09-27 | Discharge: 2020-09-27 | Disposition: A | Discharge status: Home / Self Care | Payer: Medicare Other | Source: Ambulatory Visit | Attending: Radiation Oncology | Admitting: Radiation Oncology

## 2020-09-27 ENCOUNTER — Ambulatory Visit: Payer: Medicare Other | Admitting: Radiation Oncology

## 2020-09-27 ENCOUNTER — Other Ambulatory Visit: Payer: Self-pay

## 2020-09-27 DIAGNOSIS — Z5112 Encounter for antineoplastic immunotherapy: Secondary | ICD-10-CM | POA: Diagnosis not present

## 2020-09-28 ENCOUNTER — Ambulatory Visit
Admission: RE | Admit: 2020-09-28 | Discharge: 2020-09-28 | Disposition: A | Discharge status: Home / Self Care | Payer: Medicare Other | Source: Ambulatory Visit | Attending: Radiation Oncology | Admitting: Radiation Oncology

## 2020-09-28 DIAGNOSIS — Z5112 Encounter for antineoplastic immunotherapy: Secondary | ICD-10-CM | POA: Diagnosis not present

## 2020-09-29 ENCOUNTER — Other Ambulatory Visit: Payer: Self-pay

## 2020-09-29 ENCOUNTER — Ambulatory Visit
Admission: RE | Admit: 2020-09-29 | Discharge: 2020-09-29 | Disposition: A | Discharge status: Home / Self Care | Payer: Medicare Other | Source: Ambulatory Visit | Attending: Radiation Oncology | Admitting: Radiation Oncology

## 2020-09-29 DIAGNOSIS — Z5112 Encounter for antineoplastic immunotherapy: Secondary | ICD-10-CM | POA: Diagnosis not present

## 2020-09-30 ENCOUNTER — Ambulatory Visit
Admission: RE | Admit: 2020-09-30 | Discharge: 2020-09-30 | Disposition: A | Discharge status: Home / Self Care | Payer: Medicare Other | Source: Ambulatory Visit | Attending: Radiation Oncology | Admitting: Radiation Oncology

## 2020-09-30 ENCOUNTER — Other Ambulatory Visit: Payer: Self-pay | Admitting: Nurse Practitioner

## 2020-09-30 DIAGNOSIS — K1230 Oral mucositis (ulcerative), unspecified: Secondary | ICD-10-CM

## 2020-09-30 DIAGNOSIS — Z5112 Encounter for antineoplastic immunotherapy: Secondary | ICD-10-CM | POA: Diagnosis not present

## 2020-09-30 MED ORDER — DEXAMETHASONE 0.5 MG/5ML PO SOLN: ORAL | 0 refills | Status: DC

## 2020-09-30 NOTE — Progress Notes (Signed)
[place]   [phone] [phone]   Clinic Follow up Note   Patient Care Team: Scotty Court, DO as PCP - General Mauro Kaufmann, RN as Oncology Nurse Navigator Rockwell Germany, RN as Oncology Nurse Navigator [name], MD as Consulting Physician (General Surgery) Truitt Merle, MD as Consulting Physician (Hematology) Eppie Gibson, MD as Attending Physician (Radiation Oncology) 10/05/2020  CHIEF COMPLAINT: Follow-up metaplastic left breast cancer  SUMMARY OF ONCOLOGIC HISTORY: Oncology History Overview Note  Cancer Staging Malignant neoplasm of upper-outer quadrant of left breast in female, estrogen receptor negative ([place]) Staging form: Breast, AJCC 8th Edition - Clinical stage from 10/28/2019: Stage IIIB (cT2, cN1, cM0, G3, ER-, PR-, HER2-) - Signed by Eppie Gibson, MD on 11/05/2019 - Pathologic stage from 12/26/2019: Stage IIIC (pT3, pN3a, cM0, G3, ER-, PR-, HER2-) - Signed by Truitt Merle, MD on 01/28/2020    Malignant neoplasm of upper-outer quadrant of left breast in female, estrogen receptor negative ([place])  10/27/2019 Mammogram   IMPRESSION: 1. Right breast calcifications spanning 2.6 cm in the upper slightly medial breast are indeterminate.   2. Left breast dominant mass at 3 o'clock, 6 cm from nipple measuring 3x3.9x2.9 cm is highly suspicious. There is overlying skin thickening, skin involvement is not excluded.   3. Left breast mass/distorted tissue at 1 o'clock, 4cm from nipple measuring 3.0x1.2x2.9 cm is likely in contiguity with the dominant mass at 3 o'clock and is also highly suspicious. With the dominant lesion the overall span a disease is approximately 6 cm.   4. Left breast distortion at 2 o'clock 10 cm from the nipple is Suspicious, measuring 1.0 x 0.8 cm.   5.  Abnormal left axillary lymph nodes (2). Cortical thickness measures up to 0.6 cm.    10/28/2019 Initial Biopsy   Diagnosis 1. Breast, left, needle core  biopsy, 3 o'clock, 5cmfn - INVASIVE MAMMARY CARCINOMA. 2. Breast, left, needle core biopsy, 2 o'clock, 10cmfn - INVASIVE MAMMARY CARCINOMA. 3. 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 Microscopic Comment 1. -3. Overall, immunohistochemistry favors a pronounced desmoplastic response, but metaplastic carcinoma cannot be excluded (Epithelioid component: CKAE1AE3 strong +, CK5/6 weak +). The greatest linear extent of tumor in any one core in specimen 1 is 14 mm. The greatest linear extent of tumor in any one core in specimen 2 is 16 mm.   10/28/2019 Receptors her2   3. PROGNOSTIC INDICATORS Results: IMMUNOHISTOCHEMICAL AND MORPHOMETRIC ANALYSIS PERFORMED MANUALLY The tumor cells are EQUIVOCAL for Her2 (2+). Her2 by FISH will be performed and results reported separately. Estrogen Receptor: 0%, NEGATIVE Progesterone Receptor: 0%, NEGATIVE Proliferation Marker Ki67: 10%    3. FLUORESCENCE IN-SITU HYBRIDIZATION Results: GROUP 5: HER2 **NEGATIVE** Equivocal form of amplification of the HER2 gene was detected in the IHC 2+ tissue sample received from this individual. HER2 FISH was performed by a technologist and cell imaging and analysis on the [employer].   10/28/2019 Cancer Staging   Staging form: Breast, AJCC 8th Edition - Clinical stage from 10/28/2019: Stage IIIB (cT2, cN1, cM0, G3, ER-, PR-, HER2-) - Signed by Eppie Gibson, MD on 11/05/2019   10/31/2019 Initial Diagnosis   Malignant neoplasm of upper-outer quadrant of left breast in female, estrogen receptor negative ([place])   10/31/2019 Pathology Results   Diagnosis Breast, right, needle core biopsy, UOQ, posterior - FOCAL USUAL DUCTAL HYPERPLASIA AND FIBROCYSTIC CHANGES WITH CALCIFICATIONS - FIBROADENOMATOID CHANGES - NO MALIGNANCY IDENTIFIED Microscopic Comment These results  were called to [facility] on November 03, 2019.   11/05/2019 Genetic Testing   She declined  Genetic testing    11/21/2019 Imaging   CT CAP w contrast  IMPRESSION: 1. Left breast mass and prominent left axillary lymph nodes, containing biopsy marking clips, consistent with newly diagnosed breast malignancy. 2. No definite evidence of distant metastatic disease in the chest, abdomen, or pelvis. 3. There are occasional small pulmonary nodules, measuring 2-3 mm, most likely incidental sequelae of prior infection or inflammation although metastatic disease is not excluded. Attention on follow-up. 4. Hepatic steatosis. 5. Aortic Atherosclerosis (ICD10-I70.0).     11/21/2019 Imaging   Bone Scan Whole Body IMPRESSION: 1. Single focus of uptake associated with a subacute fracture of LEFT anterior fourth rib. 2. No additional areas of abnormal uptake.   12/26/2019 Cancer Staging   Staging form: Breast, AJCC 8th Edition - Pathologic stage from 12/26/2019: Stage IIIC (pT3, pN3a, cM0, G3, ER-, PR-, HER2-) - Signed by Truitt Merle, MD on 01/28/2020   12/26/2019 Surgery   LEFT MASTECTOMY MODIFIED RADICAL and PAC Placement by Dr Marlou Starks   12/26/2019 Pathology Results   FINAL MICROSCOPIC DIAGNOSIS:   A. 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  - Ductal carcinoma in situ, high nuclear grade with central necrosis.  - Margins of resection:  - Metaplastic carcinoma focally involves the anterior margin and is < 1  mm from the posterior margin.  - DCIS is < 1 mm from the anterior margin.  - Metastatic carcinoma in (13) of (16) lymph nodes with extranodal  extension.  - Biopsy clip sites in breast and one lymph node.  - See oncology table.    ADDENDUM:  PROGNOSTIC INDICATOR RESULTS:  Immunohistochemical and morphometric analysis performed manually  The tumor cells are EQUIVOCAL for Her2 (2+). Her2 FISH has been ordered  and will be reported in an addendum.  Estrogen Receptor:       NEGATIVE   Progesterone Receptor:   NEGATIVE  Proliferation Marker Ki-67:   30%    ADDENDUM:  FLOURESCENCE IN-SITU HYBRIDIZATION RESULTS:  GROUP 5:   HER2 NEGATIVE   01/28/2020 Echocardiogram   Baseline Echo  IMPRESSIONS     1. Left ventricular ejection fraction, by estimation, is 60 to 65%. The  left ventricle has normal function. The left ventricle has no regional  wall motion abnormalities. Left ventricular diastolic parameters are  consistent with Grade I diastolic  dysfunction (impaired relaxation). The average left ventricular global  longitudinal strain is -19.8 %.   2. Right ventricular systolic function is normal. The right ventricular  size is normal. Tricuspid regurgitation signal is inadequate for assessing  PA pressure.   3. The mitral valve is normal in structure. No evidence of mitral valve  regurgitation. No evidence of mitral stenosis.   4. The aortic valve is normal in structure. Aortic valve regurgitation is  not visualized. No aortic stenosis is present.   5. The inferior vena cava is normal in size with greater than 50%  respiratory variability, suggesting right atrial pressure of 3 mmHg.    02/05/2020 -  Chemotherapy   Keytruda q3weeks (to be taken for 1 whole year) with weekly Carboplatin/Taxol for 12 weeks starting 02/05/20-05/13/20 followed by Adriamycin/Cytoxan q2weeks X4 starting 05/20/20-07/13/20.  ----Continue Keytruda q3weeks from 08/03/20 to complete 1 year of treatment from 02/05/20).    06/17/2020 Breast US   FINDINGS: On  physical exam,well-healed scars of LEFT mastectomy and LEFT axillary node dissection. I palpate no axillary mass. Ultrasound is performed, showing normal appearing LEFT axillary contents. No mass or enlarged lymph nodes. IMPRESSION: Ultrasound is negative for LEFT axillary adenopathy.   08/24/2020 -  Radiation Therapy   Adjuvant RT per Dr. Isidore Moos starting 08/24/20     CURRENT THERAPY:  1.Continue Keytruda q3weeks from 08/03/20 to complete 1  year of treatment from 02/05/20). 2.Adjuvant RT per Dr. Claris Pong 08/24/20 - 10/05/20  INTERVAL HISTORY: Ms. Xie returns for follow-up and treatment as scheduled.  She completed another cycle of pembrolizumab on 09/14/2020.  She continues left breast radiation.  She was seen in the hall last week and reported mouth sores, she was given Gelclair samples and used Dex mouthwash swish/spit sparingly in the interim.  Kshitiz has improved.  Taste is not back yet.  For the past 2 weeks she has had loose stool, anywhere from 0-2 episodes per day on Lomotil.  Denies abdominal pain or cramping, or bloody stools.  No recent antibiotics except Doxy x1 3 weeks ago for tick bite.  She pulled another 1 off her scalp last week, was not on long.  She has a blister on the left chest in the radiation field and 2 axillary skin tags.  She has mild exertional dyspnea, she continues to work in the yard and is active.  She otherwise has no complaints, denies fever, chills, cough, chest pain. All other systems were reviewed with the patient and are negative.  MEDICAL HISTORY:  Past Medical History:  Diagnosis Date  . Bronchitis   . Family history of bone cancer   . Family history of breast cancer   . Family history of prostate cancer   . Fibromyalgia   . HCV (hepatitis C virus)   . MRSA (methicillin resistant Staphylococcus aureus) 2012    SURGICAL HISTORY: Past Surgical History:  Procedure Laterality Date  . MASTECTOMY MODIFIED RADICAL Left 12/26/2019   Procedure: LEFT MASTECTOMY MODIFIED RADICAL;  Surgeon: [name], MD;  Location: [facility];  Service: General;  Laterality: Left;  PEC BLOCK  . PORTACATH PLACEMENT Right 12/26/2019   Procedure: INSERTION PORT-A-CATH WITH ULTRASOUND GUIDANCE;  Surgeon: [name], MD;  Location: [facility];  Service: General;  Laterality: Right;    I have reviewed the social history and family history with the patient and they are unchanged from previous note.  ALLERGIES:  is  allergic to codeine.  MEDICATIONS:  Current Outpatient Medications  Medication Sig Dispense Refill  . mucosal barrier oral (GELCLAIR) GEL Take 1 packet by mouth as needed.    Marland Kitchen acetaminophen (TYLENOL) 650 MG CR tablet Take 650 mg by mouth every 8 (eight) hours as needed for pain.     Marland Kitchen ascorbic acid (VITAMIN C) 250 MG CHEW Chew by mouth.    . dexamethasone (DECADRON) 0.5 MG/5ML solution Take 10 ml (1 mg) swish for 2 minutes and then spit.  Do this four times daily as needed Do Not eat or drink for 1 hour after. 240 mL 0  . diphenoxylate-atropine (LOMOTIL) 2.5-0.025 MG tablet Take 1-2 tablets by mouth 4 (four) times daily as needed for diarrhea or loose stools. 30 tablet 1  . gabapentin (NEURONTIN) 300 MG capsule Take 300 mg by mouth 3 (three) times daily as needed.    . lidocaine (XYLOCAINE) 2 % solution Use as directed 15 mLs in the mouth or throat every 4 (four) hours as needed for mouth pain. 100 mL  2  . lidocaine-prilocaine (EMLA) cream Apply 1 application topically as needed. 30 g 2  . methocarbamol (ROBAXIN) 750 MG tablet Take 1 tablet (750 mg total) by mouth 4 (four) times daily as needed (use for muscle cramps/pain). 30 tablet 2  . omeprazole (PRILOSEC) 20 MG capsule Take 1 capsule (20 mg total) by mouth daily. 30 capsule 2  . valACYclovir (VALTREX) 1000 MG tablet Take 1 tablet (1,000 mg total) by mouth 2 (two) times daily. 14 tablet 1   No current facility-administered medications for this visit.   Facility-Administered Medications Ordered in Other Visits  Medication Dose Route Frequency Provider Last Rate Last Admin  . sodium chloride flush (NS) 0.9 % injection 10 mL  10 mL Intracatheter PRN Truitt Merle, MD   10 mL at 10/05/20 1257    PHYSICAL EXAMINATION: ECOG PERFORMANCE STATUS: 1 - Symptomatic but completely ambulatory  Vitals:   10/05/20 1021  BP: 117/61  Pulse: 89  Resp: 20  Temp: 97.8 F (36.6 C)  SpO2: 100%   Filed Weights   10/05/20 1021  Weight: 148 lb 3.2 oz  (67.2 kg)    GENERAL:alert, no distress and comfortable SKIN: small raised erythematous lesion at right scalp/skull base. Left chest and axillary erythema with blister at the superior border and 2 axillary skin tags  EYES: sclera clear LYMPH:  no palpable left axillary lymphadenopathy LUNGS:  normal breathing effort HEART: no lower extremity edema NEURO: alert & oriented x 3 with fluent speech, no focal motor/sensory deficits PAC without erythema  LABORATORY DATA:  I have reviewed the data as listed CBC Latest Ref Rng & Units 10/05/2020 09/14/2020 08/25/2020  WBC 4.0 - 10.5 K/uL 3.1(L) 4.3 4.5  Hemoglobin 12.0 - 15.0 g/dL 10.8(L) 10.9(L) 10.8(L)  Hematocrit 36.0 - 46.0 % 32.5(L) 32.5(L) 32.3(L)  Platelets 150 - 400 K/uL 116(L) 150 201     CMP Latest Ref Rng & Units 10/05/2020 09/14/2020 08/25/2020  Glucose 70 - 99 mg/dL 111(H) 100(H) 97  BUN 8 - 23 mg/dL 10 10 5(L)  Creatinine 0.44 - 1.00 mg/dL 0.64 0.63 0.60  Sodium 135 - 145 mmol/L 139 137 135  Potassium 3.5 - 5.1 mmol/L 4.1 4.0 3.9  Chloride 98 - 111 mmol/L 106 104 102  CO2 22 - 32 mmol/L '26 27 24  ' Calcium 8.9 - 10.3 mg/dL 8.7(L) 8.8(L) 8.8(L)  Total Protein 6.5 - 8.1 g/dL 5.2(L) 5.4(L) 5.8(L)  Total Bilirubin 0.3 - 1.2 mg/dL 0.3 0.4 0.5  Alkaline Phos 38 - 126 U/L 73 71 71  AST 15 - 41 U/L 34 42(H) 43(H)  ALT 0 - 44 U/L '11 12 12      ' RADIOGRAPHIC STUDIES: I have personally reviewed the radiological images as listed and agreed with the findings in the report. No results found.   ASSESSMENT & PLAN: [name] a 65 y.o.femalewith  1.Left breast Multifocal Metaplastic cancer,pT3N3aM0,including one unresectable axillary node, metaplastic carcinoma,ER-/PR-/HER2-, grade IIIC -She was diagnosed in 10/2019 with left breastmetaplasticcancer, triple negative disease metastatic to left axillary LN. Her 11/2019 CT CAP and bone scan was negative for distant metastasis.  -S/pleft mastectomy with Dr Marlou Starks on  12/26/19.Surgical pathshowed6cm multifocal metaplastic carcinomaand13/16 positive LN but unfortunately 1 positive LN was not able to be removeddue to vascularinvasion. -She startedadjuvant chemo with Keytrudaq3weeks(to be taken for 1 whole year)withweekly Carboplatin/Taxolfor 12 weeksbeginning9/30/21- 1/6/22followed byAC q2 weeks x4starting 05/20/20- 07/13/20. She tolerated AC poorly with mucositis, weight loss, constipation, and weakness. She is recovering well -interim left axillary US2/10/22 isnegative  for adenopathy, c/w response to adjuvant chemo -on pembrolizumab q3 weeks to complete 1 year from 01/2020.  -She began adjuvant RT on 08/24/20. Tolerating well so far -plan to repeat CT/bone scan after RT then q 6 months for a few years on surveillance -Ms. Bulthuis appears stable. She tolerates single agent pembro well overall. Able to function well, PS improved off chemo. Labs stable to proceed with pembro today as planned.  -F/up in 3 weeks with next cycle, continue RT  2. Tick bite  -She recently had a tick attacked for 3 days, removed 09/13/20. She took prophylactic dose of doxy x1 on 5/10 - She had another tick recently on right skull base/scalp which she removed quickly  -no signs of systemic infection, we reviewed s/sx. I previously gave her printed article on Lyme disease and what to monitor. No indication for serologic testing for now -monitoring, she will notify with new symptoms or concerns   3. G3 mucositis and weight loss -she developed mucositis after cycle 2 AC and lost 8 lbs. However, she has lost close to 20 lbs on AC and 30 lbs since starting chemo in 01/2020 -declined appetite stimulantor dietician.  -continue oral supportive care  -she had mucositis flare last week, began gelclair and uses dex swish/spit sparingly. Knows to avoid ingesting steroids on pembro  4. Hepatitis C,untreated,diagnosed in 1992 -pending referralto ID or [facility] for treatment  after she completed chemo. -monitoring LFTs and for possible hep C flare while on chemo/immunotherapy -AST became elevated in 06/2020, 42-49 range, stable  5. Social and financial support  -She has no children and no close relatives. She does have close friendsand church communitywho are supportiveand strong faith.  -She did not have medical insurance for many years. She recently applied for blue cross and blue shield which is not effective yet.  6. Mild anemia -Secondary to chemotherapy, mild, will continue monitoring. -Stable  Disposition: Ms. Kronick appears stable.  She continues q3 week pembrolizumab and daily radiation completing today, responding expectantly with erythema to the left chest and axilla.  She tolerates pembrolizumab well with mild fatigue, taste change, and periodic diarrhea.  Side effects are well managed with supportive care at home.  She is able to recover and function well. Plan to complete 1 year in 12/2020.  She had another recent tick bite on low scalp, removed. No signs of systemic infection or indication for further testing. Will monitor.   Labs reviewed, ANC 1.4, plt 116K, Hg 10.8.  We reviewed precautions.  We will repeat CBC next week prior to vacation.  She will proceed with another cycle of pembrolizumab today as planned.  She will complete radiation today.  Follow-up in [place] in 3 weeks.   Plan to obtain surveillance CT/bone scan after she completes immunotherapy in 12/2020.   All questions were answered. The patient knows to call the clinic with any problems, questions or concerns. No barriers to learning were detected. Total encounter time is 30 minutes.      Alla Feeling, NP 10/05/20

## 2020-10-01 ENCOUNTER — Encounter: Payer: Self-pay | Admitting: *Deleted

## 2020-10-01 ENCOUNTER — Ambulatory Visit
Admission: RE | Admit: 2020-10-01 | Discharge: 2020-10-01 | Disposition: A | Discharge status: Home / Self Care | Payer: Medicare Other | Source: Ambulatory Visit | Attending: Radiation Oncology | Admitting: Radiation Oncology

## 2020-10-01 ENCOUNTER — Other Ambulatory Visit: Payer: Self-pay

## 2020-10-01 DIAGNOSIS — Z5112 Encounter for antineoplastic immunotherapy: Secondary | ICD-10-CM | POA: Diagnosis not present

## 2020-10-05 ENCOUNTER — Encounter: Payer: Self-pay | Admitting: Radiation Oncology

## 2020-10-05 ENCOUNTER — Ambulatory Visit
Admission: RE | Admit: 2020-10-05 | Discharge: 2020-10-05 | Disposition: A | Discharge status: Home / Self Care | Payer: Medicare Other | Source: Ambulatory Visit | Attending: Radiation Oncology | Admitting: Radiation Oncology

## 2020-10-05 ENCOUNTER — Other Ambulatory Visit: Payer: Self-pay

## 2020-10-05 ENCOUNTER — Inpatient Hospital Stay: Payer: Medicare Other

## 2020-10-05 ENCOUNTER — Inpatient Hospital Stay (HOSPITAL_BASED_OUTPATIENT_CLINIC_OR_DEPARTMENT_OTHER): Payer: Medicare Other | Admitting: Nurse Practitioner

## 2020-10-05 ENCOUNTER — Encounter: Payer: Self-pay | Admitting: Nurse Practitioner

## 2020-10-05 VITALS — BP 117/61 | HR 89 | Temp 97.8°F | Resp 20 | Ht 61.0 in | Wt 148.2 lb

## 2020-10-05 DIAGNOSIS — Z5112 Encounter for antineoplastic immunotherapy: Secondary | ICD-10-CM | POA: Diagnosis not present

## 2020-10-05 DIAGNOSIS — C50412 Malignant neoplasm of upper-outer quadrant of left female breast: Secondary | ICD-10-CM

## 2020-10-05 DIAGNOSIS — Z171 Estrogen receptor negative status [ER-]: Secondary | ICD-10-CM

## 2020-10-05 DIAGNOSIS — Z95828 Presence of other vascular implants and grafts: Secondary | ICD-10-CM

## 2020-10-05 LAB — CBC WITH DIFFERENTIAL (CANCER CENTER ONLY)
Abs Immature Granulocytes: 0 10*3/uL (ref 0.00–0.07)
Basophils Absolute: 0 10*3/uL (ref 0.0–0.1)
Basophils Relative: 1 %
Eosinophils Absolute: 0 10*3/uL (ref 0.0–0.5)
Eosinophils Relative: 1 %
HCT: 32.5 % — ABNORMAL LOW (ref 36.0–46.0)
Hemoglobin: 10.8 g/dL — ABNORMAL LOW (ref 12.0–15.0)
Immature Granulocytes: 0 %
Lymphocytes Relative: 39 %
Lymphs Abs: 1.2 10*3/uL (ref 0.7–4.0)
MCH: 33.5 pg (ref 26.0–34.0)
MCHC: 33.2 g/dL (ref 30.0–36.0)
MCV: 100.9 fL — ABNORMAL HIGH (ref 80.0–100.0)
Monocytes Absolute: 0.4 10*3/uL (ref 0.1–1.0)
Monocytes Relative: 13 %
Neutro Abs: 1.4 10*3/uL — ABNORMAL LOW (ref 1.7–7.7)
Neutrophils Relative %: 46 %
Platelet Count: 116 10*3/uL — ABNORMAL LOW (ref 150–400)
RBC: 3.22 MIL/uL — ABNORMAL LOW (ref 3.87–5.11)
RDW: 11.8 % (ref 11.5–15.5)
WBC Count: 3.1 10*3/uL — ABNORMAL LOW (ref 4.0–10.5)
nRBC: 0 % (ref 0.0–0.2)

## 2020-10-05 LAB — CMP (CANCER CENTER ONLY)
ALT: 11 U/L (ref 0–44)
AST: 34 U/L (ref 15–41)
Albumin: 3.2 g/dL — ABNORMAL LOW (ref 3.5–5.0)
Alkaline Phosphatase: 73 U/L (ref 38–126)
Anion gap: 7 (ref 5–15)
BUN: 10 mg/dL (ref 8–23)
CO2: 26 mmol/L (ref 22–32)
Calcium: 8.7 mg/dL — ABNORMAL LOW (ref 8.9–10.3)
Chloride: 106 mmol/L (ref 98–111)
Creatinine: 0.64 mg/dL (ref 0.44–1.00)
GFR, Estimated: >60 mL/min (ref >60)
Glucose, Bld: 111 mg/dL — ABNORMAL HIGH (ref 70–99)
Potassium: 4.1 mmol/L (ref 3.5–5.1)
Sodium: 139 mmol/L (ref 135–145)
Total Bilirubin: 0.3 mg/dL (ref 0.3–1.2)
Total Protein: 5.2 g/dL — ABNORMAL LOW (ref 6.5–8.1)

## 2020-10-05 MED ORDER — SODIUM CHLORIDE 0.9% FLUSH
10.0000 mL | Freq: Once | INTRAVENOUS | Status: AC
  Administered 2020-10-05: 10 mL
  Filled 2020-10-05: qty 10

## 2020-10-05 MED ORDER — HEPARIN SOD (PORK) LOCK FLUSH 100 UNIT/ML IV SOLN
500.0000 [IU] | Freq: Once | INTRAVENOUS | Status: AC | PRN
  Administered 2020-10-05: 500 [IU]
  Filled 2020-10-05: qty 5

## 2020-10-05 MED ORDER — SODIUM CHLORIDE 0.9% FLUSH
10.0000 mL | INTRAVENOUS | Status: DC | PRN
  Administered 2020-10-05: 10 mL
  Filled 2020-10-05: qty 10

## 2020-10-05 MED ORDER — SODIUM CHLORIDE 0.9 % IV SOLN
Freq: Once | INTRAVENOUS | Status: AC
  Filled 2020-10-05: qty 250

## 2020-10-05 MED ORDER — SODIUM CHLORIDE 0.9 % IV SOLN
200.0000 mg | Freq: Once | INTRAVENOUS | Status: AC
  Administered 2020-10-05: 200 mg via INTRAVENOUS
  Filled 2020-10-05: qty 8

## 2020-10-05 NOTE — Progress Notes (Signed)
Per Cira Rue NP, ok to treat with ANC 1.4.

## 2020-10-05 NOTE — Patient Instructions (Signed)
[name]   ?Discharge Instructions: ?Thank you for choosing [name] to provide your oncology and hematology care.  ? ?If you have a lab appointment with the [name], please go directly to the [name] and check in at the registration area. ?  ?Wear comfortable clothing and clothing appropriate for easy access to any Portacath or PICC line.  ? ?We strive to give you quality time with your provider. You may need to reschedule your appointment if you arrive late (15 or more minutes).  Arriving late affects you and other patients whose appointments are after yours.  Also, if you miss three or more appointments without notifying the office, you may be dismissed from the clinic at the provider?s discretion.    ?  ?For prescription refill requests, have your pharmacy contact our office and allow 72 hours for refills to be completed.   ? ?Today you received the following chemotherapy and/or immunotherapy agents: pembrolizumab    ?  ?To help prevent nausea and vomiting after your treatment, we encourage you to take your nausea medication as directed. ? ?BELOW ARE SYMPTOMS THAT SHOULD BE REPORTED IMMEDIATELY: ?*FEVER GREATER THAN 100.4 F (38 ?C) OR HIGHER ?*CHILLS OR SWEATING ?*NAUSEA AND VOMITING THAT IS NOT CONTROLLED WITH YOUR NAUSEA MEDICATION ?*UNUSUAL SHORTNESS OF BREATH ?*UNUSUAL BRUISING OR BLEEDING ?*URINARY PROBLEMS (pain or burning when urinating, or frequent urination) ?*BOWEL PROBLEMS (unusual diarrhea, constipation, pain near the anus) ?TENDERNESS IN MOUTH AND THROAT WITH OR WITHOUT PRESENCE OF ULCERS (sore throat, sores in mouth, or a toothache) ?UNUSUAL RASH, SWELLING OR PAIN  ?UNUSUAL VAGINAL DISCHARGE OR ITCHING  ? ?Items with * indicate a potential emergency and should be followed up as soon as possible or go to the Emergency Department if any problems should occur. ? ?Please show the CHEMOTHERAPY ALERT CARD or IMMUNOTHERAPY ALERT CARD at  check-in to the Emergency Department and triage nurse. ? ?Should you have questions after your visit or need to cancel or reschedule your appointment, please contact [name]  Dept: [phone]  and follow the prompts.  Office hours are 8:00 a.m. to 4:30 p.m. Monday - Friday. Please note that voicemails left after 4:00 p.m. may not be returned until the following business day.  We are closed weekends and major holidays. You have access to a nurse at all times for urgent questions. Please call the main number to the clinic Dept: [phone] and follow the prompts. ? ? ?For any non-urgent questions, you may also contact your provider using MyChart. We now offer e-Visits for anyone 18 and older to request care online for non-urgent symptoms. For details visit [email or web address]. ?  ?Also download the MyChart app! Go to the app store, search "MyChart", open the app, select [facility], and log in with your MyChart username and password. ? ?Due to Covid, a mask is required upon entering the hospital/clinic. If you do not have a mask, one will be given to you upon arrival. For doctor visits, patients may have 1 support person aged 18 or older with them. For treatment visits, patients cannot have anyone with them due to current Covid guidelines and our immunocompromised population.  ? ?

## 2020-10-11 ENCOUNTER — Other Ambulatory Visit: Payer: Medicare Other

## 2020-10-12 ENCOUNTER — Inpatient Hospital Stay: Payer: Medicare Other | Attending: Hematology

## 2020-10-12 ENCOUNTER — Other Ambulatory Visit: Payer: Self-pay

## 2020-10-12 DIAGNOSIS — D6481 Anemia due to antineoplastic chemotherapy: Secondary | ICD-10-CM | POA: Insufficient documentation

## 2020-10-12 DIAGNOSIS — Z5112 Encounter for antineoplastic immunotherapy: Secondary | ICD-10-CM | POA: Insufficient documentation

## 2020-10-12 DIAGNOSIS — Z171 Estrogen receptor negative status [ER-]: Secondary | ICD-10-CM | POA: Insufficient documentation

## 2020-10-12 DIAGNOSIS — B1920 Unspecified viral hepatitis C without hepatic coma: Secondary | ICD-10-CM | POA: Insufficient documentation

## 2020-10-12 DIAGNOSIS — C50412 Malignant neoplasm of upper-outer quadrant of left female breast: Secondary | ICD-10-CM | POA: Insufficient documentation

## 2020-10-12 DIAGNOSIS — Z95828 Presence of other vascular implants and grafts: Secondary | ICD-10-CM

## 2020-10-12 LAB — CBC WITH DIFFERENTIAL (CANCER CENTER ONLY)
Abs Immature Granulocytes: 0.01 10*3/uL (ref 0.00–0.07)
Basophils Absolute: 0 10*3/uL (ref 0.0–0.1)
Basophils Relative: 1 %
Eosinophils Absolute: 0.1 10*3/uL (ref 0.0–0.5)
Eosinophils Relative: 2 %
HCT: 34 % — ABNORMAL LOW (ref 36.0–46.0)
Hemoglobin: 11.4 g/dL — ABNORMAL LOW (ref 12.0–15.0)
Immature Granulocytes: 0 %
Lymphocytes Relative: 40 %
Lymphs Abs: 1.5 10*3/uL (ref 0.7–4.0)
MCH: 33.4 pg (ref 26.0–34.0)
MCHC: 33.5 g/dL (ref 30.0–36.0)
MCV: 99.7 fL (ref 80.0–100.0)
Monocytes Absolute: 0.4 10*3/uL (ref 0.1–1.0)
Monocytes Relative: 11 %
Neutro Abs: 1.8 10*3/uL (ref 1.7–7.7)
Neutrophils Relative %: 46 %
Platelet Count: 153 10*3/uL (ref 150–400)
RBC: 3.41 MIL/uL — ABNORMAL LOW (ref 3.87–5.11)
RDW: 11.9 % (ref 11.5–15.5)
WBC Count: 3.8 10*3/uL — ABNORMAL LOW (ref 4.0–10.5)
nRBC: 0 % (ref 0.0–0.2)

## 2020-10-12 LAB — CMP (CANCER CENTER ONLY)
ALT: 13 U/L (ref 0–44)
AST: 39 U/L (ref 15–41)
Albumin: 3.2 g/dL — ABNORMAL LOW (ref 3.5–5.0)
Alkaline Phosphatase: 76 U/L (ref 38–126)
Anion gap: 7 (ref 5–15)
BUN: 12 mg/dL (ref 8–23)
CO2: 23 mmol/L (ref 22–32)
Calcium: 8.7 mg/dL — ABNORMAL LOW (ref 8.9–10.3)
Chloride: 106 mmol/L (ref 98–111)
Creatinine: 0.67 mg/dL (ref 0.44–1.00)
GFR, Estimated: >60 mL/min (ref >60)
Glucose, Bld: 106 mg/dL — ABNORMAL HIGH (ref 70–99)
Potassium: 4.1 mmol/L (ref 3.5–5.1)
Sodium: 136 mmol/L (ref 135–145)
Total Bilirubin: 0.3 mg/dL (ref 0.3–1.2)
Total Protein: 5.6 g/dL — ABNORMAL LOW (ref 6.5–8.1)

## 2020-10-12 MED ORDER — SODIUM CHLORIDE 0.9% FLUSH
10.0000 mL | Freq: Once | INTRAVENOUS | Status: AC
  Administered 2020-10-12: 10 mL
  Filled 2020-10-12: qty 10

## 2020-10-12 MED ORDER — HEPARIN SOD (PORK) LOCK FLUSH 100 UNIT/ML IV SOLN
500.0000 [IU] | Freq: Once | INTRAVENOUS | Status: AC
  Administered 2020-10-12: 500 [IU]
  Filled 2020-10-12: qty 5

## 2020-10-22 NOTE — Progress Notes (Addendum)
Lindsay [name]   Telephone:([phone]) [phone] Fax:(336) [phone]   Clinic Follow up Note   Patient Care Team: Lindsay Court, DO as PCP - General Lindsay Kaufmann, RN as Oncology Nurse Navigator Rockwell Germany, RN as Oncology Nurse Navigator Lindsay Kussmaul, MD as Consulting Physician (General Surgery) Lindsay Merle, MD as Consulting Physician (Hematology) Lindsay Gibson, MD as Attending Physician (Radiation Oncology)  Date of Service:  10/27/2020  CHIEF COMPLAINT: F/u of left breast metaplastic cancer, triple negative   SUMMARY OF ONCOLOGIC HISTORY: Oncology History Overview Note  Cancer Staging Malignant neoplasm of upper-outer quadrant of left breast in female, estrogen receptor negative (Lindsay [name]) Staging form: Breast, AJCC 8th Edition - Clinical stage from 10/28/2019: Stage IIIB (cT2, cN1, cM0, G3, ER-, PR-, HER2-) - Signed by Lindsay Gibson, MD on 11/05/2019 - Pathologic stage from 12/26/2019: Stage IIIC (pT3, pN3a, cM0, G3, ER-, PR-, HER2-) - Signed by Lindsay Merle, MD on 01/28/2020    Malignant neoplasm of upper-outer quadrant of left breast in female, estrogen receptor negative (Lindsay [name])  10/27/2019 Mammogram   IMPRESSION: 1. Right breast calcifications spanning 2.6 cm in Lindsay upper slightly medial breast are indeterminate.   2. Left breast dominant mass at 3 o'clock, 6 cm from nipple measuring 3x3.9x2.9 cm is highly suspicious. There is overlying skin thickening, skin involvement is not excluded.   3. Left breast mass/distorted tissue at 1 o'clock, 4cm from nipple measuring 3.0x1.2x2.9 cm is likely in contiguity with Lindsay dominant mass at 3 o'clock and is also highly suspicious. With Lindsay dominant lesion Lindsay overall span a disease is approximately 6 cm.   4. Left breast distortion at 2 o'clock 10 cm from Lindsay nipple is Suspicious, measuring 1.0 x 0.8 cm.   5.  Abnormal left axillary lymph nodes (2). Cortical thickness measures up to 0.6 cm.    10/28/2019 Initial Biopsy    Diagnosis 1. Breast, left, needle core biopsy, 3 o'clock, 5cmfn - INVASIVE MAMMARY CARCINOMA. 2. Breast, left, needle core biopsy, 2 o'clock, 10cmfn - INVASIVE MAMMARY CARCINOMA. 3. 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 Microscopic Comment 1. -3. Overall, immunohistochemistry favors a pronounced desmoplastic response, but metaplastic carcinoma cannot be excluded (Epithelioid component: CKAE1AE3 strong +, CK5/6 weak +). Lindsay greatest linear extent of tumor in any one core in specimen 1 is 14 mm. Lindsay greatest linear extent of tumor in any one core in specimen 2 is 16 mm.   10/28/2019 Receptors her2   3. PROGNOSTIC INDICATORS Results: IMMUNOHISTOCHEMICAL AND MORPHOMETRIC ANALYSIS PERFORMED MANUALLY Lindsay tumor cells are EQUIVOCAL for Her2 (2+). Her2 by FISH will be performed and results reported separately. Estrogen Receptor: 0%, NEGATIVE Progesterone Receptor: 0%, NEGATIVE Proliferation Marker Ki67: 10%    3. FLUORESCENCE IN-SITU HYBRIDIZATION Results: GROUP 5: HER2 **NEGATIVE** Equivocal form of amplification of Lindsay HER2 gene was detected in Lindsay IHC 2+ tissue sample received from this individual. HER2 FISH was performed by a technologist and cell imaging and analysis on Lindsay [employer].   10/28/2019 Cancer Staging   Staging form: Breast, AJCC 8th Edition - Clinical stage from 10/28/2019: Stage IIIB (cT2, cN1, cM0, G3, ER-, PR-, HER2-) - Signed by Lindsay Gibson, MD on 11/05/2019    10/31/2019 Initial Diagnosis   Malignant neoplasm of upper-outer quadrant of left breast in female, estrogen receptor negative (Lindsay [name])   10/31/2019 Pathology Results   Diagnosis Breast, right, needle core biopsy, UOQ, posterior - FOCAL USUAL DUCTAL HYPERPLASIA AND FIBROCYSTIC CHANGES WITH CALCIFICATIONS -  FIBROADENOMATOID CHANGES - NO MALIGNANCY IDENTIFIED Microscopic Comment These results were called to Lindsay [name] on November 03, 2019.    11/05/2019 Genetic Testing   She declined Genetic testing    11/21/2019 Imaging   CT CAP w contrast  IMPRESSION: 1. Left breast mass and prominent left axillary lymph nodes, containing biopsy marking clips, consistent with newly diagnosed breast malignancy. 2. No definite evidence of distant metastatic disease in Lindsay chest, abdomen, or pelvis. 3. There are occasional small pulmonary nodules, measuring 2-3 mm, most likely incidental sequelae of prior infection or inflammation although metastatic disease is not excluded. Attention on follow-up. 4. Hepatic steatosis. 5. Aortic Atherosclerosis (ICD10-I70.0).     11/21/2019 Imaging   Bone Scan Whole Body IMPRESSION: 1. Single focus of uptake associated with a subacute fracture of LEFT anterior fourth rib. 2. No additional areas of abnormal uptake.   12/26/2019 Cancer Staging   Staging form: Breast, AJCC 8th Edition - Pathologic stage from 12/26/2019: Stage IIIC (pT3, pN3a, cM0, G3, ER-, PR-, HER2-) - Signed by Lindsay Merle, MD on 01/28/2020    12/26/2019 Surgery   LEFT MASTECTOMY MODIFIED RADICAL and PAC Placement by Dr Lindsay [name]   12/26/2019 Pathology Results   FINAL MICROSCOPIC DIAGNOSIS:   A. 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  - Ductal carcinoma in situ, high nuclear grade with central necrosis.  - Margins of resection:  - Metaplastic carcinoma focally involves Lindsay anterior margin and is < 1  mm from Lindsay posterior margin.  - DCIS is < 1 mm from Lindsay anterior margin.  - Metastatic carcinoma in (13) of (16) lymph nodes with extranodal  extension.  - Biopsy clip sites in breast and one lymph node.  - See oncology table.    ADDENDUM:  PROGNOSTIC INDICATOR RESULTS:  Immunohistochemical and morphometric analysis performed manually  Lindsay tumor cells are EQUIVOCAL for Her2 (2+). Her2 FISH has been ordered  and will be reported in an  addendum.  Estrogen Receptor:       NEGATIVE  Progesterone Receptor:   NEGATIVE  Proliferation Marker Ki-67:   30%    ADDENDUM:  FLOURESCENCE IN-SITU HYBRIDIZATION RESULTS:  GROUP 5:   HER2 NEGATIVE   01/28/2020 Echocardiogram   Baseline Echo  IMPRESSIONS     1. Left ventricular ejection fraction, by estimation, is 60 to 65%. Lindsay  left ventricle has normal function. Lindsay left ventricle has no regional  wall motion abnormalities. Left ventricular diastolic parameters are  consistent with Grade I diastolic  dysfunction (impaired relaxation). Lindsay average left ventricular global  longitudinal strain is -19.8 %.   2. Right ventricular systolic function is normal. Lindsay right ventricular  size is normal. Tricuspid regurgitation signal is inadequate for assessing  PA pressure.   3. Lindsay mitral valve is normal in structure. No evidence of mitral valve  regurgitation. No evidence of mitral stenosis.   4. Lindsay aortic valve is normal in structure. Aortic valve regurgitation is  not visualized. No aortic stenosis is present.   5. Lindsay inferior vena cava is normal in size with greater than 50%  respiratory variability, suggesting right atrial pressure of 3 mmHg.    02/05/2020 -  Chemotherapy   Keytruda q3weeks (to be taken for 1 whole year) with weekly Carboplatin/Taxol for 12 weeks starting 02/05/20-05/13/20 followed by Adriamycin/Cytoxan q2weeks X4 starting 05/20/20-07/13/20.  ----Continue Keytruda q3weeks from 08/03/20 to complete 1 year of treatment from  02/05/20).    06/17/2020 Breast US   FINDINGS: On physical exam,well-healed scars of LEFT mastectomy and LEFT axillary node dissection. I palpate no axillary mass. Ultrasound is performed, showing normal appearing LEFT axillary contents. No mass or enlarged lymph nodes. IMPRESSION: Ultrasound is negative for LEFT axillary adenopathy.   08/24/2020 - 10/05/2020 Radiation Therapy   Adjuvant RT per Dr. Isidore Moos starting 08/24/20-10/05/20      CURRENT  THERAPY:  Adjuvant Keytruda q3weeks from 08/03/20 to complete 1 year of treatment from 02/05/20.   INTERVAL HISTORY:  Lindsay [name] is here for a follow up. She was last seen by me 08/25/20 and has been seen by NP Lacie in interim. She presents to Lindsay clinic alone. She is eating well, but still lossing weight., lost 10 lbs in last 6 weeks  She is back to work, but part time, energy is fiare, still has dyspnea on moderate exertion, no chest pain  No pain or other concerns   All other systems were reviewed with Lindsay patient and are negative.  MEDICAL HISTORY:  Past Medical History:  Diagnosis Date   Bronchitis    Family history of bone cancer    Family history of breast cancer    Family history of prostate cancer    Fibromyalgia    HCV (hepatitis C virus)    MRSA (methicillin resistant Staphylococcus aureus) 2012    SURGICAL HISTORY: Past Surgical History:  Procedure Laterality Date   MASTECTOMY MODIFIED RADICAL Left 12/26/2019   Procedure: LEFT MASTECTOMY MODIFIED RADICAL;  Surgeon: Lindsay Kussmaul, MD;  Location: [facility];  Service: General;  Laterality: Left;  PEC BLOCK   PORTACATH PLACEMENT Right 12/26/2019   Procedure: INSERTION PORT-A-CATH WITH ULTRASOUND GUIDANCE;  Surgeon: Lindsay Kussmaul, MD;  Location: [facility];  Service: General;  Laterality: Right;    I have reviewed Lindsay social history and family history with Lindsay patient and they are unchanged from previous note.  ALLERGIES:  is allergic to codeine.  MEDICATIONS:  Current Outpatient Medications  Medication Sig Dispense Refill   acetaminophen (TYLENOL) 650 MG CR tablet Take 650 mg by mouth every 8 (eight) hours as needed for pain.      ascorbic acid (VITAMIN C) 250 MG CHEW Chew by mouth.     dexamethasone (DECADRON) 0.5 MG/5ML solution Take 10 ml (1 mg) swish for 2 minutes and then spit.  Do this four times daily as needed Do Not eat or drink for 1 hour after. 240 mL 0   diphenoxylate-atropine (LOMOTIL) 2.5-0.025 MG tablet  Take 1-2 tablets by mouth 4 (four) times daily as needed for diarrhea or loose stools. 30 tablet 1   gabapentin (NEURONTIN) 300 MG capsule Take 300 mg by mouth 3 (three) times daily as needed.     lidocaine (XYLOCAINE) 2 % solution Use as directed 15 mLs in Lindsay mouth or throat every 4 (four) hours as needed for mouth pain. 100 mL 2   lidocaine-prilocaine (EMLA) cream Apply 1 application topically as needed. 30 g 2   methocarbamol (ROBAXIN) 750 MG tablet Take 1 tablet (750 mg total) by mouth 4 (four) times daily as needed (use for muscle cramps/pain). 30 tablet 2   mucosal barrier oral (GELCLAIR) GEL Take 1 packet by mouth as needed.     omeprazole (PRILOSEC) 20 MG capsule Take 1 capsule (20 mg total) by mouth daily. 30 capsule 2   valACYclovir (VALTREX) 1000 MG tablet Take 1 tablet (1,000 mg total) by mouth 2 (two) times daily.  14 tablet 1   No current facility-administered medications for this visit.    PHYSICAL EXAMINATION: ECOG PERFORMANCE STATUS: 0 - Asymptomatic  Vitals:   10/27/20 1015  BP: 135/78  Pulse: 63  Resp: 19  Temp: 97.9 F (36.6 C)  SpO2: 99%   Filed Weights   10/27/20 1015  Weight: 143 lb 8 oz (65.1 kg)    GENERAL:alert, no distress and comfortable SKIN: skin color, texture, turgor are normal, no rashes or significant lesions EYES: normal, Conjunctiva are pink and non-injected, sclera clear NECK: supple, thyroid normal size, non-tender, without nodularity LYMPH:  no palpable lymphadenopathy in Lindsay cervical, axillary  LUNGS: clear to auscultation and percussion with normal breathing effort HEART: regular rate & rhythm and no murmurs and no lower extremity edema ABDOMEN:abdomen soft, non-tender and normal bowel sounds Musculoskeletal:no cyanosis of digits and no clubbing  NEURO: alert & oriented x 3 with fluent speech, no focal motor/sensory deficits  LABORATORY DATA:  I have reviewed Lindsay data as listed CBC Latest Ref Rng & Units 10/27/2020 10/12/2020 10/05/2020   WBC 4.0 - 10.5 K/uL 4.5 3.8(L) 3.1(L)  Hemoglobin 12.0 - 15.0 g/dL 11.7(L) 11.4(L) 10.8(L)  Hematocrit 36.0 - 46.0 % 33.8(L) 34.0(L) 32.5(L)  Platelets 150 - 400 K/uL 162 153 116(L)     CMP Latest Ref Rng & Units 10/27/2020 10/12/2020 10/05/2020  Glucose 70 - 99 mg/dL 98 106(H) 111(H)  BUN 8 - 23 mg/dL _0 Creatinine 0.44 - 1.00 mg/dL 0.61 0.67 0.64  Sodium 135 - 145 mmol/L 137 136 139  Potassium 3.5 - 5.1 mmol/L 4.0 4.1 4.1  Chloride 98 - 111 mmol/L 107 106 106  CO2 22 - 32 mmol/L _1 Calcium 8.9 - 10.3 mg/dL 8.8(L) 8.7(L) 8.7(L)  Total Protein 6.5 - 8.1 g/dL 5.8(L) 5.6(L) 5.2(L)  Total Bilirubin 0.3 - 1.2 mg/dL 0.3 0.3 0.3  Alkaline Phos 38 - 126 U/L 73 76 73  AST 15 - 41 U/L 38 39 34  ALT 0 - 44 U/L _2 RADIOGRAPHIC STUDIES: I have personally reviewed Lindsay radiological images as listed and agreed with Lindsay findings in Lindsay report. No results found.   ASSESSMENT & PLAN:  Lindsay [name] is a 65 y.o. female with    1. 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 Her 11/2019 CT CAP and bone scan was negative for distant metastasis. -She underwent left mastectomy with Dr Lindsay [name] on 12/26/19. Surgical path showed 6cm multifocal metaplastic carcinoma and 13/16 positive LN but unfortunately 1 positive LN was not able to be removed given it invasion of a vessel.  -She was treated adjuvant Carboplatin/Taxol (02/05/20-05/13/20) and Adriamycin/Cytoxan (/13/22-07/13/20) along with Keytruda. She will continue Keytruda q3weeks from 08/03/20 to complete 1 year of treatment from 02/05/20).  -To reduce her risk of local recurrence she proceeded with adjuvant radiation with Dr Isidore Moos on 08/24/20. -She tolerated last few treatments of AC poorly with mouth sores, weight  loss, constipation, weakness, SOB. She is recovering well from chemotherapy and starting to gain weight back. Labs reviewed, Hg 10.8. Overall adequate to proceed with Keytruda today. -She is interested in CT scan to evaluate her LN at some point. I discussed doing scan after her radiation and she agrees. She is not interested in re-excision with Dr Lindsay [name] to remove  LN. -We also discussed Lindsay breast cancer surveillance after radiation and Keytruda. She will continue annual screening mammogram, CT Scans every 6 months for a few years, self exams, and a routine office visit with lab and exam with [place].  -She is tolerating Keytruda well without any noticeable side effects.  Lab reviewed, adequate for treatment, will proceed today, she has 4 more treatments left to complete 1 year therapy -Follow-up in 6 weeks. -Plan to repeat CT scan before next visit    2. Hepatitis C, untreated, diagnosed in 1992 -I will refer her to [facility] or [facility] for treatment after she completed chemo and radiation.  -She also notes a history of MRSA which she is not sure has completed cleared. -We will watch her liver functions closely during Lindsay chemotherapy.  We discussed her that she may have hepatitis C flare when she has chemo    3. Social and financial support -She has no children and no close relatives. She does have close friends who are supportive. -She did not have medical insurance for many years. She recently applied for blue cross and blue shield which is not effective yet.    4. Mild anemia -Secondary to chemotherapy, mild, will continue monitoring. -Stable   PLAN: -Labs reviewed and adequate to proceed with [facility] today and continue every 3 weeks for 4 more treatments -f/u in 6 weeks with restaging CT scan    No problem-specific Assessment & Plan notes found for this encounter.   Orders Placed This Encounter  Procedures   CT CHEST ABDOMEN PELVIS W CONTRAST    Standing Status:   Future    Standing  Expiration Date:   10/27/2021    Order Specific Question:   If indicated for Lindsay ordered procedure, I authorize Lindsay administration of contrast media per Radiology protocol    Answer:   Yes    Order Specific Question:   Preferred imaging location?    Answer:   [facility]    Order Specific Question:   Release to patient    Answer:   Immediate    Order Specific Question:   Is Oral Contrast requested for this exam?    Answer:   Yes, Per Radiology protocol    Order Specific Question:   Reason for Exam (SYMPTOM  OR DIAGNOSIS REQUIRED)    Answer:   rule out recurrence, she had unresetable positive node in left axilla    All questions were answered. Lindsay patient knows to call Lindsay clinic with any problems, questions or concerns. No barriers to learning was detected. Lindsay total time spent in Lindsay appointment was 30 minutes.     Lindsay Merle, MD 10/27/2020   I, Joslyn Devon, am acting as scribe for Lindsay Merle, MD.   I have reviewed Lindsay above documentation for accuracy and completeness, and I agree with Lindsay above.

## 2020-10-25 ENCOUNTER — Other Ambulatory Visit (HOSPITAL_COMMUNITY): Payer: Medicare Other

## 2020-10-26 ENCOUNTER — Other Ambulatory Visit (HOSPITAL_COMMUNITY): Payer: Medicare Other

## 2020-10-26 ENCOUNTER — Telehealth: Payer: Self-pay | Admitting: *Deleted

## 2020-10-26 NOTE — Telephone Encounter (Signed)
Pt called asking about gelclair samples. She states that [facility] had given her some before.  Message to Dr Feng/Pod RN to see if they know where these may be.  Informed pt to ask while she is here tomorrow.

## 2020-10-27 ENCOUNTER — Inpatient Hospital Stay: Payer: Medicare Other

## 2020-10-27 ENCOUNTER — Other Ambulatory Visit: Payer: Self-pay

## 2020-10-27 ENCOUNTER — Inpatient Hospital Stay (HOSPITAL_BASED_OUTPATIENT_CLINIC_OR_DEPARTMENT_OTHER): Payer: Medicare Other | Admitting: Hematology

## 2020-10-27 ENCOUNTER — Encounter: Payer: Self-pay | Admitting: Hematology

## 2020-10-27 ENCOUNTER — Ambulatory Visit (HOSPITAL_COMMUNITY)
Admission: RE | Admit: 2020-10-27 | Discharge: 2020-10-27 | Disposition: A | Discharge status: Home / Self Care | Payer: Medicare Other | Source: Ambulatory Visit | Attending: Hematology | Admitting: Hematology

## 2020-10-27 ENCOUNTER — Encounter: Payer: Self-pay | Admitting: *Deleted

## 2020-10-27 VITALS — BP 135/78 | HR 63 | Temp 97.9°F | Resp 19 | Ht 61.0 in | Wt 143.5 lb

## 2020-10-27 DIAGNOSIS — Z95828 Presence of other vascular implants and grafts: Secondary | ICD-10-CM

## 2020-10-27 DIAGNOSIS — C50412 Malignant neoplasm of upper-outer quadrant of left female breast: Secondary | ICD-10-CM | POA: Diagnosis present

## 2020-10-27 DIAGNOSIS — Z171 Estrogen receptor negative status [ER-]: Secondary | ICD-10-CM

## 2020-10-27 DIAGNOSIS — Z5112 Encounter for antineoplastic immunotherapy: Secondary | ICD-10-CM | POA: Diagnosis not present

## 2020-10-27 DIAGNOSIS — Z0189 Encounter for other specified special examinations: Secondary | ICD-10-CM

## 2020-10-27 LAB — CMP (CANCER CENTER ONLY)
ALT: 14 U/L (ref 0–44)
AST: 38 U/L (ref 15–41)
Albumin: 3.4 g/dL — ABNORMAL LOW (ref 3.5–5.0)
Alkaline Phosphatase: 73 U/L (ref 38–126)
Anion gap: 6 (ref 5–15)
BUN: 11 mg/dL (ref 8–23)
CO2: 24 mmol/L (ref 22–32)
Calcium: 8.8 mg/dL — ABNORMAL LOW (ref 8.9–10.3)
Chloride: 107 mmol/L (ref 98–111)
Creatinine: 0.61 mg/dL (ref 0.44–1.00)
GFR, Estimated: >60 mL/min (ref >60)
Glucose, Bld: 98 mg/dL (ref 70–99)
Potassium: 4 mmol/L (ref 3.5–5.1)
Sodium: 137 mmol/L (ref 135–145)
Total Bilirubin: 0.3 mg/dL (ref 0.3–1.2)
Total Protein: 5.8 g/dL — ABNORMAL LOW (ref 6.5–8.1)

## 2020-10-27 LAB — CBC WITH DIFFERENTIAL (CANCER CENTER ONLY)
Abs Immature Granulocytes: 0 10*3/uL (ref 0.00–0.07)
Basophils Absolute: 0 10*3/uL (ref 0.0–0.1)
Basophils Relative: 1 %
Eosinophils Absolute: 0.2 10*3/uL (ref 0.0–0.5)
Eosinophils Relative: 4 %
HCT: 33.8 % — ABNORMAL LOW (ref 36.0–46.0)
Hemoglobin: 11.7 g/dL — ABNORMAL LOW (ref 12.0–15.0)
Immature Granulocytes: 0 %
Lymphocytes Relative: 38 %
Lymphs Abs: 1.7 10*3/uL (ref 0.7–4.0)
MCH: 33.1 pg (ref 26.0–34.0)
MCHC: 34.6 g/dL (ref 30.0–36.0)
MCV: 95.5 fL (ref 80.0–100.0)
Monocytes Absolute: 0.4 10*3/uL (ref 0.1–1.0)
Monocytes Relative: 9 %
Neutro Abs: 2.2 10*3/uL (ref 1.7–7.7)
Neutrophils Relative %: 48 %
Platelet Count: 162 10*3/uL (ref 150–400)
RBC: 3.54 MIL/uL — ABNORMAL LOW (ref 3.87–5.11)
RDW: 12.1 % (ref 11.5–15.5)
WBC Count: 4.5 10*3/uL (ref 4.0–10.5)
nRBC: 0 % (ref 0.0–0.2)

## 2020-10-27 LAB — ECHOCARDIOGRAM COMPLETE
Area-P 1/2: 2.02 cm2
S' Lateral: 3 cm

## 2020-10-27 MED ORDER — SODIUM CHLORIDE 0.9% FLUSH
10.0000 mL | INTRAVENOUS | Status: DC | PRN
  Administered 2020-10-27: 10 mL
  Filled 2020-10-27: qty 10

## 2020-10-27 MED ORDER — SODIUM CHLORIDE 0.9% FLUSH
10.0000 mL | Freq: Once | INTRAVENOUS | Status: AC
  Administered 2020-10-27: 10 mL
  Filled 2020-10-27: qty 10

## 2020-10-27 MED ORDER — SODIUM CHLORIDE 0.9 % IV SOLN
Freq: Once | INTRAVENOUS | Status: AC
  Filled 2020-10-27: qty 250

## 2020-10-27 MED ORDER — HEPARIN SOD (PORK) LOCK FLUSH 100 UNIT/ML IV SOLN
500.0000 [IU] | Freq: Once | INTRAVENOUS | Status: AC | PRN
  Administered 2020-10-27: 500 [IU]
  Filled 2020-10-27: qty 5

## 2020-10-27 MED ORDER — PEMBROLIZUMAB CHEMO INJECTION 100 MG/4ML
200.0000 mg | Freq: Once | INTRAVENOUS | Status: AC
  Administered 2020-10-27: 200 mg via INTRAVENOUS
  Filled 2020-10-27: qty 8

## 2020-10-27 NOTE — Patient Instructions (Signed)
[facility] MEDICAL ONCOLOGY   ?Discharge Instructions: ?Thank you for choosing [facility] to provide your oncology and hematology care.  ? ?If you have a lab appointment with the Cancer Center, please go directly to the Cancer Center and check in at the registration area. ?  ?Wear comfortable clothing and clothing appropriate for easy access to any Portacath or PICC line.  ? ?We strive to give you quality time with your provider. You may need to reschedule your appointment if you arrive late (15 or more minutes).  Arriving late affects you and other patients whose appointments are after yours.  Also, if you miss three or more appointments without notifying the office, you may be dismissed from the clinic at the provider?s discretion.    ?  ?For prescription refill requests, have your pharmacy contact our office and allow 72 hours for refills to be completed.   ? ?Today you received the following chemotherapy and/or immunotherapy agents: pembrolizumab    ?  ?To help prevent nausea and vomiting after your treatment, we encourage you to take your nausea medication as directed. ? ?BELOW ARE SYMPTOMS THAT SHOULD BE REPORTED IMMEDIATELY: ?*FEVER GREATER THAN 100.4 F (38 ?C) OR HIGHER ?*CHILLS OR SWEATING ?*NAUSEA AND VOMITING THAT IS NOT CONTROLLED WITH YOUR NAUSEA MEDICATION ?*UNUSUAL SHORTNESS OF BREATH ?*UNUSUAL BRUISING OR BLEEDING ?*URINARY PROBLEMS (pain or burning when urinating, or frequent urination) ?*BOWEL PROBLEMS (unusual diarrhea, constipation, pain near the anus) ?TENDERNESS IN MOUTH AND THROAT WITH OR WITHOUT PRESENCE OF ULCERS (sore throat, sores in mouth, or a toothache) ?UNUSUAL RASH, SWELLING OR PAIN  ?UNUSUAL VAGINAL DISCHARGE OR ITCHING  ? ?Items with * indicate a potential emergency and should be followed up as soon as possible or go to the Emergency Department if any problems should occur. ? ?Please show the CHEMOTHERAPY ALERT CARD or IMMUNOTHERAPY ALERT CARD at  check-in to the Emergency Department and triage nurse. ? ?Should you have questions after your visit or need to cancel or reschedule your appointment, please contact [facility]COL[facility]  Dept: [phone]  and follow the prompts.  Office hours are 8:00 a.m. to 4:30 p.m. Monday - Friday. Please note that voicemails left after 4:00 p.m. may not be returned until the following business day.  We are closed weekends and major holidays. You have access to a nurse at all times for urgent questions. Please call the main number to the clinic Dept: [phone] and follow the prompts. ? ? ?For any non-urgent questions, you may also contact your provider using MyChart. We now offer e-Visits for anyone 18 and older to request care online for non-urgent symptoms. For details visit [email or web address]. ?  ?Also download the MyChart app! Go to the app store, search "MyChart", open the app, select , and log in with your MyChart username and password. ? ?Due to Covid, a mask is required upon entering the hospital/clinic. If you do not have a mask, one will be given to you upon arrival. For doctor visits, patients may have 1 support person aged 18 or older with them. For treatment visits, patients cannot have anyone with them due to current Covid guidelines and our immunocompromised population.  ? ?

## 2020-10-27 NOTE — Progress Notes (Signed)
  Echocardiogram 2D Echocardiogram with 3D and strain has been performed.  [name] M 10/27/2020, 8:57 AM

## 2020-10-28 ENCOUNTER — Telehealth: Payer: Self-pay | Admitting: Hematology

## 2020-10-28 NOTE — Telephone Encounter (Signed)
Left message with follow-up appointments per 6/22 los.

## 2020-11-09 ENCOUNTER — Telehealth: Payer: Self-pay

## 2020-11-09 NOTE — Telephone Encounter (Signed)
Patient made aware of Echo results and verbalized understanding.

## 2020-11-09 NOTE — Telephone Encounter (Signed)
-----   Message from Truitt Merle, MD sent at 11/06/2020 11:55 AM EDT ----- Please let pt know her echo was normal, no concerns, thanks   Truitt Merle  11/06/2020

## 2020-11-10 ENCOUNTER — Ambulatory Visit: Payer: Medicare Other | Admitting: Radiation Oncology

## 2020-11-10 ENCOUNTER — Telehealth: Payer: Self-pay

## 2020-11-10 NOTE — Telephone Encounter (Signed)
I called the patient today about her upcoming follow-up appointment in radiation oncology.   Given the state of the COVID-19 pandemic, concerning case numbers in our community, and guidance from [facility], I offered a phone assessment with the patient to determine if coming to the clinic was necessary. She accepted.  I let the patient know that I had spoken with Dr. Isidore Moos, and she wanted them to know the importance of washing their hands for at least 20 seconds at a time, especially after going out in public, and before they eat.  Limit going out in public whenever possible. Do not touch your face, unless your hands are clean, such as when bathing. Get plenty of rest, eat well, and stay hydrated. Patient verbalized understanding and agreement.   The patient denies any symptomatic concerns.  She reports she feels "the best I've felt in 12 months". She reports her energy has returned and she has been able to resume many of the activities she enjoys doing (gardening, spending time with friends/family, going for walks, etc). She states she is slowly regaining her range of motion to her left arm/shoulder, and continues to wear her compression sleeve for lymphedema. Specifically, she reports good healing of her skin in the radiation fields. Reports her skin peeled about a week after completing radiation, but is now intact and almost back to her baseline color/texture. I recommended that she continue skin care by applying oil or lotion with vitamin E to the skin in the radiation fields, BID, for 2 more months.    Continue follow-up with medical oncology - follow-up is scheduled on 12/06/2020 with Dr. Truitt Merle.  I explained that yearly mammograms are important for patients with intact breast tissue, and physical exams are important after mastectomy for patients that cannot undergo mammography.  I encouraged her to call if she had further questions or concerns about her healing. Otherwise, she will  follow-up PRN in radiation oncology. Patient is pleased with this plan, and we will cancel her upcoming follow-up to reduce the risk of COVID-19 transmission.

## 2020-11-16 ENCOUNTER — Inpatient Hospital Stay: Payer: Medicare Other

## 2020-11-16 ENCOUNTER — Inpatient Hospital Stay: Payer: Medicare Other | Attending: Hematology

## 2020-11-16 ENCOUNTER — Other Ambulatory Visit: Payer: Self-pay

## 2020-11-16 VITALS — BP 113/75 | HR 67 | Temp 98.4°F | Resp 18

## 2020-11-16 DIAGNOSIS — C50412 Malignant neoplasm of upper-outer quadrant of left female breast: Secondary | ICD-10-CM | POA: Diagnosis present

## 2020-11-16 DIAGNOSIS — Z5112 Encounter for antineoplastic immunotherapy: Secondary | ICD-10-CM | POA: Diagnosis not present

## 2020-11-16 DIAGNOSIS — Z95828 Presence of other vascular implants and grafts: Secondary | ICD-10-CM

## 2020-11-16 LAB — CBC WITH DIFFERENTIAL (CANCER CENTER ONLY)
Abs Immature Granulocytes: 0 10*3/uL (ref 0.00–0.07)
Basophils Absolute: 0 10*3/uL (ref 0.0–0.1)
Basophils Relative: 1 %
Eosinophils Absolute: 0.2 10*3/uL (ref 0.0–0.5)
Eosinophils Relative: 4 %
HCT: 33.3 % — ABNORMAL LOW (ref 36.0–46.0)
Hemoglobin: 11.8 g/dL — ABNORMAL LOW (ref 12.0–15.0)
Immature Granulocytes: 0 %
Lymphocytes Relative: 32 %
Lymphs Abs: 1.3 10*3/uL (ref 0.7–4.0)
MCH: 32.9 pg (ref 26.0–34.0)
MCHC: 35.4 g/dL (ref 30.0–36.0)
MCV: 92.8 fL (ref 80.0–100.0)
Monocytes Absolute: 0.4 10*3/uL (ref 0.1–1.0)
Monocytes Relative: 10 %
Neutro Abs: 2.1 10*3/uL (ref 1.7–7.7)
Neutrophils Relative %: 53 %
Platelet Count: 159 10*3/uL (ref 150–400)
RBC: 3.59 MIL/uL — ABNORMAL LOW (ref 3.87–5.11)
RDW: 12.2 % (ref 11.5–15.5)
WBC Count: 4 10*3/uL (ref 4.0–10.5)
nRBC: 0 % (ref 0.0–0.2)

## 2020-11-16 LAB — CMP (CANCER CENTER ONLY)
ALT: 15 U/L (ref 0–44)
AST: 40 U/L (ref 15–41)
Albumin: 3.3 g/dL — ABNORMAL LOW (ref 3.5–5.0)
Alkaline Phosphatase: 72 U/L (ref 38–126)
Anion gap: 6 (ref 5–15)
BUN: 12 mg/dL (ref 8–23)
CO2: 27 mmol/L (ref 22–32)
Calcium: 9 mg/dL (ref 8.9–10.3)
Chloride: 105 mmol/L (ref 98–111)
Creatinine: 0.62 mg/dL (ref 0.44–1.00)
GFR, Estimated: >60 mL/min (ref >60)
Glucose, Bld: 100 mg/dL — ABNORMAL HIGH (ref 70–99)
Potassium: 3.9 mmol/L (ref 3.5–5.1)
Sodium: 138 mmol/L (ref 135–145)
Total Bilirubin: 0.4 mg/dL (ref 0.3–1.2)
Total Protein: 5.7 g/dL — ABNORMAL LOW (ref 6.5–8.1)

## 2020-11-16 MED ORDER — SODIUM CHLORIDE 0.9 % IV SOLN
Freq: Once | INTRAVENOUS | Status: AC
Start: 2020-11-16 — End: 2020-11-16
  Filled 2020-11-16: qty 250

## 2020-11-16 MED ORDER — SODIUM CHLORIDE 0.9% FLUSH
10.0000 mL | INTRAVENOUS | Status: DC | PRN
  Administered 2020-11-16: 10 mL
  Filled 2020-11-16: qty 10

## 2020-11-16 MED ORDER — SODIUM CHLORIDE 0.9% FLUSH
10.0000 mL | Freq: Once | INTRAVENOUS | Status: AC
  Administered 2020-11-16: 10 mL
  Filled 2020-11-16: qty 10

## 2020-11-16 MED ORDER — SODIUM CHLORIDE 0.9 % IV SOLN
200.0000 mg | Freq: Once | INTRAVENOUS | Status: AC
  Administered 2020-11-16: 200 mg via INTRAVENOUS
  Filled 2020-11-16: qty 8

## 2020-11-16 MED ORDER — HEPARIN SOD (PORK) LOCK FLUSH 100 UNIT/ML IV SOLN
500.0000 [IU] | Freq: Once | INTRAVENOUS | Status: AC | PRN
Start: 2020-11-16 — End: 2020-11-16
  Administered 2020-11-16: 500 [IU]
  Filled 2020-11-16: qty 5

## 2020-11-16 NOTE — Patient Instructions (Signed)
[facility]  Discharge Instructions: ?Thank you for choosing [facility] to provide your oncology and hematology care.  ? ?If you have a lab appointment with the Cancer Center, please go directly to the Cancer Center and check in at the registration area. ?  ?Wear comfortable clothing and clothing appropriate for easy access to any Portacath or PICC line.  ? ?We strive to give you quality time with your provider. You may need to reschedule your appointment if you arrive late (15 or more minutes).  Arriving late affects you and other patients whose appointments are after yours.  Also, if you miss three or more appointments without notifying the office, you may be dismissed from the clinic at the provider?s discretion.    ?  ?For prescription refill requests, have your pharmacy contact our office and allow 72 hours for refills to be completed.   ? ?Today you received the following chemotherapy and/or immunotherapy agents: Keytruda ?  ?To help prevent nausea and vomiting after your treatment, we encourage you to take your nausea medication as directed. ? ?BELOW ARE SYMPTOMS THAT SHOULD BE REPORTED IMMEDIATELY: ?*FEVER GREATER THAN 100.4 F (38 ?C) OR HIGHER ?*CHILLS OR SWEATING ?*NAUSEA AND VOMITING THAT IS NOT CONTROLLED WITH YOUR NAUSEA MEDICATION ?*UNUSUAL SHORTNESS OF BREATH ?*UNUSUAL BRUISING OR BLEEDING ?*URINARY PROBLEMS (pain or burning when urinating, or frequent urination) ?*BOWEL PROBLEMS (unusual diarrhea, constipation, pain near the anus) ?TENDERNESS IN MOUTH AND THROAT WITH OR WITHOUT PRESENCE OF ULCERS (sore throat, sores in mouth, or a toothache) ?UNUSUAL RASH, SWELLING OR PAIN  ?UNUSUAL VAGINAL DISCHARGE OR ITCHING  ? ?Items with * indicate a potential emergency and should be followed up as soon as possible or go to the Emergency Department if any problems should occur. ? ?Please show the CHEMOTHERAPY ALERT CARD or IMMUNOTHERAPY ALERT CARD at check-in to the  Emergency Department and triage nurse. ? ?Should you have questions after your visit or need to cancel or reschedule your appointment, please contact  [facility]ENTER MEDICAL [facility]  Dept: [phone]  and follow the prompts.  Office hours are 8:00 a.m. to 4:30 p.m. Monday - Friday. Please note that voicemails left after 4:00 p.m. may not be returned until the following business day.  We are closed weekends and major holidays. You have access to a nurse at all times for urgent questions. Please call the main number to the clinic Dept: [phone] and follow the prompts. ? ? ?For any non-urgent questions, you may also contact your provider using MyChart. We now offer e-Visits for anyone 18 and older to request care online for non-urgent symptoms. For details visit [email or web address]. ?  ?Also download the MyChart app! Go to the app store, search "MyChart", open the app, select , and log in with your MyChart username and password. ? ?Due to Covid, a mask is required upon entering the hospital/clinic. If you do not have a mask, one will be given to you upon arrival. For doctor visits, patients may have 1 support person aged 18 or older with them. For treatment visits, patients cannot have anyone with them due to current Covid guidelines and our immunocompromised population.  ? ?

## 2020-11-16 NOTE — Patient Instructions (Signed)

## 2020-11-26 ENCOUNTER — Ambulatory Visit: Payer: Medicare Other | Admitting: Radiation Oncology

## 2020-12-01 ENCOUNTER — Encounter: Payer: Self-pay | Admitting: Hematology

## 2020-12-01 NOTE — Progress Notes (Signed)
                                                                                                                                                             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  Cancer Staging: Cancer Staging Malignant neoplasm of upper-outer quadrant of left breast in female, estrogen receptor negative ([place]) Staging form: Breast, AJCC 8th Edition - Clinical stage from 10/28/2019: Stage IIIB (cT2, cN1, cM0, G3, ER-, PR-, HER2-) - Signed by Eppie Gibson, MD on 11/05/2019 Stage prefix: Initial diagnosis Nuclear grade: G3 Histologic grading system: 3 grade system - Pathologic stage from 12/26/2019: Stage IIIC (pT3, pN3a, cM0, G3, ER-, PR-, HER2-) - Signed by Truitt Merle, MD on 01/28/2020 Stage prefix: Initial diagnosis Multigene prognostic tests performed: None Histologic grading system: 3 grade system Residual tumor (R): R0 - None   Intent: Curative  Radiation Treatment Dates: 08/24/2020 through 10/05/2020 Site Technique Total Dose (Gy) Dose per Fx (Gy) Completed Fx Beam Energies  Chest Wall, Left: CW_Lt_IMN 3D 50/50 2 25/25 10X  Chest Wall, Left: CW_Lt_SCV_PAB 3D 50/50 2 25/25 6X, 15X  Chest Wall, Left: CW_Lt_Bst Electron 10/10 2 5/5 6E   Narrative: The patient tolerated radiation therapy relatively well.   Plan: The patient will follow-up with radiation oncology in [place] as needed. -----------------------------------  SEppie Gibson MD

## 2020-12-06 ENCOUNTER — Other Ambulatory Visit: Payer: Self-pay

## 2020-12-06 ENCOUNTER — Inpatient Hospital Stay: Payer: Medicare Other | Attending: Hematology

## 2020-12-06 ENCOUNTER — Encounter: Payer: Self-pay | Admitting: Hematology

## 2020-12-06 ENCOUNTER — Inpatient Hospital Stay: Payer: Medicare Other

## 2020-12-06 ENCOUNTER — Inpatient Hospital Stay (HOSPITAL_BASED_OUTPATIENT_CLINIC_OR_DEPARTMENT_OTHER): Payer: Medicare Other | Admitting: Hematology

## 2020-12-06 ENCOUNTER — Encounter: Payer: Self-pay | Admitting: *Deleted

## 2020-12-06 VITALS — BP 118/78 | HR 70 | Temp 98.1°F | Resp 18 | Ht 61.0 in | Wt 138.3 lb

## 2020-12-06 DIAGNOSIS — C50412 Malignant neoplasm of upper-outer quadrant of left female breast: Secondary | ICD-10-CM

## 2020-12-06 DIAGNOSIS — B1920 Unspecified viral hepatitis C without hepatic coma: Secondary | ICD-10-CM | POA: Insufficient documentation

## 2020-12-06 DIAGNOSIS — Z171 Estrogen receptor negative status [ER-]: Secondary | ICD-10-CM

## 2020-12-06 DIAGNOSIS — D6481 Anemia due to antineoplastic chemotherapy: Secondary | ICD-10-CM | POA: Insufficient documentation

## 2020-12-06 DIAGNOSIS — Z95828 Presence of other vascular implants and grafts: Secondary | ICD-10-CM

## 2020-12-06 DIAGNOSIS — R634 Abnormal weight loss: Secondary | ICD-10-CM | POA: Diagnosis not present

## 2020-12-06 DIAGNOSIS — Z5112 Encounter for antineoplastic immunotherapy: Secondary | ICD-10-CM | POA: Insufficient documentation

## 2020-12-06 DIAGNOSIS — Z452 Encounter for adjustment and management of vascular access device: Secondary | ICD-10-CM | POA: Diagnosis not present

## 2020-12-06 LAB — CBC WITH DIFFERENTIAL (CANCER CENTER ONLY)
Abs Immature Granulocytes: 0.01 10*3/uL (ref 0.00–0.07)
Basophils Absolute: 0 10*3/uL (ref 0.0–0.1)
Basophils Relative: 1 %
Eosinophils Absolute: 0.1 10*3/uL (ref 0.0–0.5)
Eosinophils Relative: 4 %
HCT: 35.3 % — ABNORMAL LOW (ref 36.0–46.0)
Hemoglobin: 12.1 g/dL (ref 12.0–15.0)
Immature Granulocytes: 0 %
Lymphocytes Relative: 32 %
Lymphs Abs: 1.3 10*3/uL (ref 0.7–4.0)
MCH: 32.5 pg (ref 26.0–34.0)
MCHC: 34.3 g/dL (ref 30.0–36.0)
MCV: 94.9 fL (ref 80.0–100.0)
Monocytes Absolute: 0.4 10*3/uL (ref 0.1–1.0)
Monocytes Relative: 10 %
Neutro Abs: 2.1 10*3/uL (ref 1.7–7.7)
Neutrophils Relative %: 53 %
Platelet Count: 170 10*3/uL (ref 150–400)
RBC: 3.72 MIL/uL — ABNORMAL LOW (ref 3.87–5.11)
RDW: 12.8 % (ref 11.5–15.5)
WBC Count: 3.9 10*3/uL — ABNORMAL LOW (ref 4.0–10.5)
nRBC: 0 % (ref 0.0–0.2)

## 2020-12-06 LAB — CMP (CANCER CENTER ONLY)
ALT: 15 U/L (ref 0–44)
AST: 35 U/L (ref 15–41)
Albumin: 3.3 g/dL — ABNORMAL LOW (ref 3.5–5.0)
Alkaline Phosphatase: 77 U/L (ref 38–126)
Anion gap: 5 (ref 5–15)
BUN: 17 mg/dL (ref 8–23)
CO2: 27 mmol/L (ref 22–32)
Calcium: 8.8 mg/dL — ABNORMAL LOW (ref 8.9–10.3)
Chloride: 107 mmol/L (ref 98–111)
Creatinine: 0.68 mg/dL (ref 0.44–1.00)
GFR, Estimated: >60 mL/min (ref >60)
Glucose, Bld: 92 mg/dL (ref 70–99)
Potassium: 4.2 mmol/L (ref 3.5–5.1)
Sodium: 139 mmol/L (ref 135–145)
Total Bilirubin: 0.4 mg/dL (ref 0.3–1.2)
Total Protein: 5.5 g/dL — ABNORMAL LOW (ref 6.5–8.1)

## 2020-12-06 MED ORDER — SODIUM CHLORIDE 0.9% FLUSH
10.0000 mL | INTRAVENOUS | Status: DC | PRN
  Administered 2020-12-06: 10 mL
  Filled 2020-12-06: qty 10

## 2020-12-06 MED ORDER — SODIUM CHLORIDE 0.9 % IV SOLN
200.0000 mg | Freq: Once | INTRAVENOUS | Status: AC
  Administered 2020-12-06: 200 mg via INTRAVENOUS
  Filled 2020-12-06: qty 8

## 2020-12-06 MED ORDER — SODIUM CHLORIDE 0.9 % IV SOLN
Freq: Once | INTRAVENOUS | Status: AC
  Filled 2020-12-06: qty 250

## 2020-12-06 MED ORDER — HEPARIN SOD (PORK) LOCK FLUSH 100 UNIT/ML IV SOLN
500.0000 [IU] | Freq: Once | INTRAVENOUS | Status: AC | PRN
  Administered 2020-12-06: 500 [IU]
  Filled 2020-12-06: qty 5

## 2020-12-06 MED ORDER — SODIUM CHLORIDE 0.9% FLUSH
10.0000 mL | Freq: Once | INTRAVENOUS | Status: AC
  Administered 2020-12-06: 10 mL
  Filled 2020-12-06: qty 10

## 2020-12-06 NOTE — Progress Notes (Signed)
[place]   Telephone:(336) [phone] Fax:(336) [phone]   Clinic Follow up Note   Patient Care Team: Scotty Court, DO as PCP - General Mauro Kaufmann, RN as Oncology Nurse Navigator Rockwell Germany, RN as Oncology Nurse Navigator [name], MD as Consulting Physician (General Surgery) Truitt Merle, MD as Consulting Physician (Hematology) Eppie Gibson, MD as Attending Physician (Radiation Oncology)  Date of Service:  12/06/2020  CHIEF COMPLAINT: F/u of left breast metaplastic cancer, triple negative   SUMMARY OF ONCOLOGIC HISTORY: Oncology History Overview Note  Cancer Staging Malignant neoplasm of upper-outer quadrant of left breast in female, estrogen receptor negative ([place]) Staging form: Breast, AJCC 8th Edition - Clinical stage from 10/28/2019: Stage IIIB (cT2, cN1, cM0, G3, ER-, PR-, HER2-) - Signed by Eppie Gibson, MD on 11/05/2019 - Pathologic stage from 12/26/2019: Stage IIIC (pT3, pN3a, cM0, G3, ER-, PR-, HER2-) - Signed by Truitt Merle, MD on 01/28/2020    Malignant neoplasm of upper-outer quadrant of left breast in female, estrogen receptor negative ([place])  10/27/2019 Mammogram   IMPRESSION: 1. Right breast calcifications spanning 2.6 cm in the upper slightly medial breast are indeterminate.   2. Left breast dominant mass at 3 o'clock, 6 cm from nipple measuring 3x3.9x2.9 cm is highly suspicious. There is overlying skin thickening, skin involvement is not excluded.   3. Left breast mass/distorted tissue at 1 o'clock, 4cm from nipple measuring 3.0x1.2x2.9 cm is likely in contiguity with the dominant mass at 3 o'clock and is also highly suspicious. With the dominant lesion the overall span a disease is approximately 6 cm.   4. Left breast distortion at 2 o'clock 10 cm from the nipple is Suspicious, measuring 1.0 x 0.8 cm.   5.  Abnormal left axillary lymph nodes (2). Cortical thickness measures up to 0.6 cm.    10/28/2019 Initial Biopsy    Diagnosis 1. Breast, left, needle core biopsy, 3 o'clock, 5cmfn - INVASIVE MAMMARY CARCINOMA. 2. Breast, left, needle core biopsy, 2 o'clock, 10cmfn - INVASIVE MAMMARY CARCINOMA. 3. 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 Microscopic Comment 1. -3. Overall, immunohistochemistry favors a pronounced desmoplastic response, but metaplastic carcinoma cannot be excluded (Epithelioid component: CKAE1AE3 strong +, CK5/6 weak +). The greatest linear extent of tumor in any one core in specimen 1 is 14 mm. The greatest linear extent of tumor in any one core in specimen 2 is 16 mm.   10/28/2019 Receptors her2   3. PROGNOSTIC INDICATORS Results: IMMUNOHISTOCHEMICAL AND MORPHOMETRIC ANALYSIS PERFORMED MANUALLY The tumor cells are EQUIVOCAL for Her2 (2+). Her2 by FISH will be performed and results reported separately. Estrogen Receptor: 0%, NEGATIVE Progesterone Receptor: 0%, NEGATIVE Proliferation Marker Ki67: 10%    3. FLUORESCENCE IN-SITU HYBRIDIZATION Results: GROUP 5: HER2 **NEGATIVE** Equivocal form of amplification of the HER2 gene was detected in the IHC 2+ tissue sample received from this individual. HER2 FISH was performed by a technologist and cell imaging and analysis on the [employer].   10/28/2019 Cancer Staging   Staging form: Breast, AJCC 8th Edition - Clinical stage from 10/28/2019: Stage IIIB (cT2, cN1, cM0, G3, ER-, PR-, HER2-) - Signed by Eppie Gibson, MD on 11/05/2019    10/31/2019 Initial Diagnosis   Malignant neoplasm of upper-outer quadrant of left breast in female, estrogen receptor negative ([place])   10/31/2019 Pathology Results   Diagnosis Breast, right, needle core biopsy, UOQ, posterior - FOCAL USUAL DUCTAL HYPERPLASIA AND FIBROCYSTIC CHANGES WITH CALCIFICATIONS -  FIBROADENOMATOID CHANGES - NO MALIGNANCY IDENTIFIED Microscopic Comment These results were called to [name] on November 03, 2019.    11/05/2019 Genetic Testing   She declined Genetic testing    11/21/2019 Imaging   CT CAP w contrast  IMPRESSION: 1. Left breast mass and prominent left axillary lymph nodes, containing biopsy marking clips, consistent with newly diagnosed breast malignancy. 2. No definite evidence of distant metastatic disease in the chest, abdomen, or pelvis. 3. There are occasional small pulmonary nodules, measuring 2-3 mm, most likely incidental sequelae of prior infection or inflammation although metastatic disease is not excluded. Attention on follow-up. 4. Hepatic steatosis. 5. Aortic Atherosclerosis (ICD10-I70.0).     11/21/2019 Imaging   Bone Scan Whole Body IMPRESSION: 1. Single focus of uptake associated with a subacute fracture of LEFT anterior fourth rib. 2. No additional areas of abnormal uptake.   12/26/2019 Cancer Staging   Staging form: Breast, AJCC 8th Edition - Pathologic stage from 12/26/2019: Stage IIIC (pT3, pN3a, cM0, G3, ER-, PR-, HER2-) - Signed by Truitt Merle, MD on 01/28/2020    12/26/2019 Surgery   LEFT MASTECTOMY MODIFIED RADICAL and PAC Placement by Dr Marlou Starks   12/26/2019 Pathology Results   FINAL MICROSCOPIC DIAGNOSIS:   A. 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  - Ductal carcinoma in situ, high nuclear grade with central necrosis.  - Margins of resection:  - Metaplastic carcinoma focally involves the anterior margin and is < 1  mm from the posterior margin.  - DCIS is < 1 mm from the anterior margin.  - Metastatic carcinoma in (13) of (16) lymph nodes with extranodal  extension.  - Biopsy clip sites in breast and one lymph node.  - See oncology table.    ADDENDUM:  PROGNOSTIC INDICATOR RESULTS:  Immunohistochemical and morphometric analysis performed manually  The tumor cells are EQUIVOCAL for Her2 (2+). Her2 FISH has been ordered  and will be reported in an  addendum.  Estrogen Receptor:       NEGATIVE  Progesterone Receptor:   NEGATIVE  Proliferation Marker Ki-67:   30%    ADDENDUM:  FLOURESCENCE IN-SITU HYBRIDIZATION RESULTS:  GROUP 5:   HER2 NEGATIVE   01/28/2020 Echocardiogram   Baseline Echo  IMPRESSIONS     1. Left ventricular ejection fraction, by estimation, is 60 to 65%. The  left ventricle has normal function. The left ventricle has no regional  wall motion abnormalities. Left ventricular diastolic parameters are  consistent with Grade I diastolic  dysfunction (impaired relaxation). The average left ventricular global  longitudinal strain is -19.8 %.   2. Right ventricular systolic function is normal. The right ventricular  size is normal. Tricuspid regurgitation signal is inadequate for assessing  PA pressure.   3. The mitral valve is normal in structure. No evidence of mitral valve  regurgitation. No evidence of mitral stenosis.   4. The aortic valve is normal in structure. Aortic valve regurgitation is  not visualized. No aortic stenosis is present.   5. The inferior vena cava is normal in size with greater than 50%  respiratory variability, suggesting right atrial pressure of 3 mmHg.    02/05/2020 -  Chemotherapy   Keytruda q3weeks (to be taken for 1 whole year) with weekly Carboplatin/Taxol for 12 weeks starting 02/05/20-05/13/20 followed by Adriamycin/Cytoxan q2weeks X4 starting 05/20/20-07/13/20.  ----Continue Keytruda q3weeks from 08/03/20 to complete 1 year of treatment from  02/05/20).    06/17/2020 Breast US   FINDINGS: On physical exam,well-healed scars of LEFT mastectomy and LEFT axillary node dissection. I palpate no axillary mass. Ultrasound is performed, showing normal appearing LEFT axillary contents. No mass or enlarged lymph nodes. IMPRESSION: Ultrasound is negative for LEFT axillary adenopathy.   08/24/2020 - 10/05/2020 Radiation Therapy   Adjuvant RT per Dr. Isidore Moos starting 08/24/20-10/05/20      CURRENT  THERAPY:  Adjuvant Keytruda q3weeks from 08/03/20 to complete 1 year of treatment from 02/05/20.   INTERVAL HISTORY:  Lindsay [name] is here for a follow up of breast cancer. She was last seen by me on 10/27/20. She presents to the clinic alone. Her hair is coming back in slowly. She reports she had a flare up of some mouth issues. She notes she is eating as much as she can and is still losing weight. She reports she is eating fruits, veggies, pork, potatoes, rice. 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 She reports her energy level is improving. She notes no one reached out to her about scheduling her CT scan.  All other systems were reviewed with the patient and are negative.  MEDICAL HISTORY:  Past Medical History:  Diagnosis Date   Bronchitis    Family history of bone cancer    Family history of breast cancer    Family history of prostate cancer    Fibromyalgia    HCV (hepatitis C virus)    MRSA (methicillin resistant Staphylococcus aureus) 2012    SURGICAL HISTORY: Past Surgical History:  Procedure Laterality Date   MASTECTOMY MODIFIED RADICAL Left 12/26/2019   Procedure: LEFT MASTECTOMY MODIFIED RADICAL;  Surgeon: [name], MD;  Location: [facility];  Service: General;  Laterality: Left;  PEC BLOCK   PORTACATH PLACEMENT Right 12/26/2019   Procedure: INSERTION PORT-A-CATH WITH ULTRASOUND GUIDANCE;  Surgeon: [name], MD;  Location: [facility];  Service: General;  Laterality: Right;    I have reviewed the social history and family history with the patient and they are unchanged from previous note.  ALLERGIES:  is allergic to codeine.  MEDICATIONS:  Current Outpatient Medications  Medication Sig Dispense Refill   acetaminophen (TYLENOL) 650 MG CR tablet Take 650 mg by mouth every 8 (eight) hours as needed for pain.      ascorbic acid (VITAMIN C) 250 MG CHEW Chew by mouth.     dexamethasone (DECADRON) 0.5 MG/5ML solution Take 10 ml  (1 mg) swish for 2 minutes and then spit.  Do this four times daily as needed Do Not eat or drink for 1 hour after. 240 mL 0   diphenoxylate-atropine (LOMOTIL) 2.5-0.025 MG tablet Take 1-2 tablets by mouth 4 (four) times daily as needed for diarrhea or loose stools. 30 tablet 1   gabapentin (NEURONTIN) 300 MG capsule Take 300 mg by mouth 3 (three) times daily as needed.     lidocaine (XYLOCAINE) 2 % solution Use as directed 15 mLs in the mouth or throat every 4 (four) hours as needed for mouth pain. 100 mL 2   lidocaine-prilocaine (EMLA) cream Apply 1 application topically as needed. 30 g 2   methocarbamol (ROBAXIN) 750 MG tablet Take 1 tablet (750 mg total) by mouth 4 (four) times daily as needed (use for muscle cramps/pain). 30 tablet 2   mucosal barrier oral (GELCLAIR) GEL Take 1 packet by mouth as needed.     omeprazole (PRILOSEC) 20 MG  capsule Take 1 capsule (20 mg total) by mouth daily. 30 capsule 2   valACYclovir (VALTREX) 1000 MG tablet Take 1 tablet (1,000 mg total) by mouth 2 (two) times daily. 14 tablet 1   No current facility-administered medications for this visit.   Facility-Administered Medications Ordered in Other Visits  Medication Dose Route Frequency Provider Last Rate Last Admin   sodium chloride flush (NS) 0.9 % injection 10 mL  10 mL Intracatheter PRN Truitt Merle, MD   10 mL at 12/06/20 1534    PHYSICAL EXAMINATION: ECOG PERFORMANCE STATUS: 0 - Asymptomatic  Vitals:   12/06/20 1120  BP: 118/78  Pulse: 70  Resp: 18  Temp: 98.1 F (36.7 C)  SpO2: 100%   Filed Weights   12/06/20 1120  Weight: 138 lb 4.8 oz (62.7 kg)    GENERAL:alert, no distress and comfortable SKIN: skin color, texture, turgor are normal, no rashes or significant lesions EYES: normal, Conjunctiva are pink and non-injected, sclera clear NECK: supple, thyroid normal size, non-tender, without nodularity LYMPH:  no palpable lymphadenopathy in the cervical, axilla ABDOMEN:abdomen soft, non-tender  and normal bowel sounds Musculoskeletal:no cyanosis of digits and no clubbing  NEURO: alert & oriented x 3 with fluent speech, no focal motor/sensory deficits BREAST: No palpable mass, nodules or adenopathy bilaterally. Breast exam benign.   LABORATORY DATA:  I have reviewed the data as listed CBC Latest Ref Rng & Units 12/06/2020 11/16/2020 10/27/2020  WBC 4.0 - 10.5 K/uL 3.9(L) 4.0 4.5  Hemoglobin 12.0 - 15.0 g/dL 12.1 11.8(L) 11.7(L)  Hematocrit 36.0 - 46.0 % 35.3(L) 33.3(L) 33.8(L)  Platelets 150 - 400 K/uL 170 159 162     CMP Latest Ref Rng & Units 12/06/2020 11/16/2020 10/27/2020  Glucose 70 - 99 mg/dL 92 100(H) 98  BUN 8 - 23 mg/dL $Remove'17 12 11  'DCvxfkB$ Creatinine 0.44 - 1.00 mg/dL 0.68 0.62 0.61  Sodium 135 - 145 mmol/L 139 138 137  Potassium 3.5 - 5.1 mmol/L 4.2 3.9 4.0  Chloride 98 - 111 mmol/L 107 105 107  CO2 22 - 32 mmol/L $RemoveB'27 27 24  'uuqTZgkb$ Calcium 8.9 - 10.3 mg/dL 8.8(L) 9.0 8.8(L)  Total Protein 6.5 - 8.1 g/dL 5.5(L) 5.7(L) 5.8(L)  Total Bilirubin 0.3 - 1.2 mg/dL 0.4 0.4 0.3  Alkaline Phos 38 - 126 U/L 77 72 73  AST 15 - 41 U/L 35 40 38  ALT 0 - 44 U/L $Remo'15 15 14      'JZyfv$ RADIOGRAPHIC STUDIES: I have personally reviewed the radiological images as listed and agreed with the findings in the report. No results found.   ASSESSMENT & PLAN:  Santos Hardwick is a 65 y.o. female with   1. 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 Her 11/2019 CT CAP and bone scan was negative for distant metastasis. -She underwent left mastectomy with Dr Marlou Starks on 12/26/19. Surgical path showed 6cm multifocal metaplastic carcinoma and 13/16 positive LN but unfortunately 1 positive LN was not able to be removed given it invasion of a vessel.  -She was treated adjuvant Carboplatin/Taxol  (02/05/20-05/13/20) and Adriamycin/Cytoxan (05/20/20-07/13/20) along with Keytruda. She has recovered well from chemo.  She will continue Keytruda q3weeks to complete 1 year of treatment -She received adjuvant radiation with Dr Isidore Moos 08/24/20 through 10/05/20. -Restaging CT was not done prior to her visit today. I will work to get  this scheduled for her. -labs reviewed, overall adequate to proceed with Keytruda.  2. Genetic testing -We previously discussed genetic testing following her diagnosis. At that time, she declined, as she was focused on treatment.  -We discussed again today (12/06/20), and I recommend proceeding to evaluate for BRCA 1/2. She is agreeable. I will order blood test for her to be done with her next labs.   2. Hepatitis C, untreated, diagnosed in 1992 -I will refer her to ID or [facility] for treatment after she completed chemo and radiation.  -She also notes a history of MRSA which she is not sure has completed cleared. -We will watch her liver functions closely during the chemotherapy.  We discussed her that she may have hepatitis C flare when she has chemo    3. Social and financial support -She has no children and no close relatives. She does have close friends who are supportive. -She did not have medical insurance for many years. She recently applied for blue cross and blue shield which is not effective yet.    4. Mild anemia -Secondary to chemotherapy -Resolved    PLAN: -proceed with Keytruda today, and as scheduled 8/24 -I will request restaging CT to be scheduled on same day of her next treatment on 8/24, I will call her with results  -labs, f/u with NP Lacie, and final Keytruda as scheduled 01/19/21 -will get genetic test done on 8/24, I will send a message to genetic counselor who saw her before    No problem-specific Assessment & Plan notes found for this encounter.   No orders of the defined types were placed in this encounter.   All questions were  answered. The patient knows to call the clinic with any problems, questions or concerns. No barriers to learning was detected. The total time spent in the appointment was 30 minutes.     Truitt Merle, MD 12/06/2020   I, Wilburn Mylar, am acting as scribe for Truitt Merle, MD.   I have reviewed the above documentation for accuracy and completeness, and I agree with the above.

## 2020-12-06 NOTE — Patient Instructions (Addendum)
[facility]  Discharge Instructions: Thank you for choosing [facility] to provide your oncology and hematology care.   If you have a lab appointment with the [facility], please go directly to the [facility] and check in at the registration area.   Wear comfortable clothing and clothing appropriate for easy access to any Portacath or PICC line.   We strive to give you quality time with your provider. You may need to reschedule your appointment if you arrive late (15 or more minutes).  Arriving late affects you and other patients whose appointments are after yours.  Also, if you miss three or more appointments without notifying the office, you may be dismissed from the clinic at the provider's discretion.      For prescription refill requests, have your pharmacy contact our office and allow 72 hours for refills to be completed.    Today you received the following chemotherapy and/or immunotherapy agents Beryle Flock      To help prevent nausea and vomiting after your treatment, we encourage you to take your nausea medication as directed.  BELOW ARE SYMPTOMS THAT SHOULD BE REPORTED IMMEDIATELY: *FEVER GREATER THAN 100.4 F (38 C) OR HIGHER *CHILLS OR SWEATING *NAUSEA AND VOMITING THAT IS NOT CONTROLLED WITH YOUR NAUSEA MEDICATION *UNUSUAL SHORTNESS OF BREATH *UNUSUAL BRUISING OR BLEEDING *URINARY PROBLEMS (pain or burning when urinating, or frequent urination) *BOWEL PROBLEMS (unusual diarrhea, constipation, pain near the anus) TENDERNESS IN MOUTH AND THROAT WITH OR WITHOUT PRESENCE OF ULCERS (sore throat, sores in mouth, or a toothache) UNUSUAL RASH, SWELLING OR PAIN  UNUSUAL VAGINAL DISCHARGE OR ITCHING   Items with * indicate a potential emergency and should be followed up as soon as possible or go to the Emergency Department if any problems should occur.  Please show the CHEMOTHERAPY ALERT CARD or IMMUNOTHERAPY ALERT CARD at check-in to  the Emergency Department and triage nurse.  Should you have questions after your visit or need to cancel or reschedule your appointment, please contact [facility]  Dept: [phone]  and follow the prompts.  Office hours are 8:00 a.m. to 4:30 p.m. Monday - Friday. Please note that voicemails left after 4:00 p.m. may not be returned until the following business day.  We are closed weekends and major holidays. You have access to a nurse at all times for urgent questions. Please call the main number to the clinic Dept: [phone] and follow the prompts.   For any non-urgent questions, you may also contact your provider using MyChart. We now offer e-Visits for anyone 81 and older to request care online for non-urgent symptoms. For details visit [email or web address].   Also download the MyChart app! Go to the app store, search "MyChart", open the app, select [facility], and log in with your MyChart username and password.  Due to Covid, a mask is required upon entering the hospital/clinic. If you do not have a mask, one will be given to you upon arrival. For doctor visits, patients may have 1 support person aged 43 or older with them. For treatment visits, patients cannot have anyone with them due to current Covid guidelines and our immunocompromised population.  [facility]  Discharge Instructions: Thank you for choosing [facility] to provide your oncology and hematology care.   If you have a lab appointment with the [facility], please go directly to the [facility] and check in at the registration area.  Wear comfortable clothing and clothing appropriate for easy access to any Portacath or PICC line.   We strive to give you quality time with your provider. You may need to reschedule your appointment if you arrive late (15 or more minutes).  Arriving late affects you and other patients whose appointments are after  yours.  Also, if you miss three or more appointments without notifying the office, you may be dismissed from the clinic at the provider's discretion.      For prescription refill requests, have your pharmacy contact our office and allow 72 hours for refills to be completed.    Today you received the following chemotherapy and/or immunotherapy agents Beryle Flock      To help prevent nausea and vomiting after your treatment, we encourage you to take your nausea medication as directed.  BELOW ARE SYMPTOMS THAT SHOULD BE REPORTED IMMEDIATELY: *FEVER GREATER THAN 100.4 F (38 C) OR HIGHER *CHILLS OR SWEATING *NAUSEA AND VOMITING THAT IS NOT CONTROLLED WITH YOUR NAUSEA MEDICATION *UNUSUAL SHORTNESS OF BREATH *UNUSUAL BRUISING OR BLEEDING *URINARY PROBLEMS (pain or burning when urinating, or frequent urination) *BOWEL PROBLEMS (unusual diarrhea, constipation, pain near the anus) TENDERNESS IN MOUTH AND THROAT WITH OR WITHOUT PRESENCE OF ULCERS (sore throat, sores in mouth, or a toothache) UNUSUAL RASH, SWELLING OR PAIN  UNUSUAL VAGINAL DISCHARGE OR ITCHING   Items with * indicate a potential emergency and should be followed up as soon as possible or go to the Emergency Department if any problems should occur.  Please show the CHEMOTHERAPY ALERT CARD or IMMUNOTHERAPY ALERT CARD at check-in to the Emergency Department and triage nurse.  Should you have questions after your visit or need to cancel or reschedule your appointment, please contact [facility]  Dept: [phone]  and follow the prompts.  Office hours are 8:00 a.m. to 4:30 p.m. Monday - Friday. Please note that voicemails left after 4:00 p.m. may not be returned until the following business day.  We are closed weekends and major holidays. You have access to a nurse at all times for urgent questions. Please call the main number to the clinic Dept: [phone] and follow the prompts.   For any non-urgent  questions, you may also contact your provider using MyChart. We now offer e-Visits for anyone 87 and older to request care online for non-urgent symptoms. For details visit [email or web address].   Also download the MyChart app! Go to the app store, search "MyChart", open the app, select [facility], and log in with your MyChart username and password.  Due to Covid, a mask is required upon entering the hospital/clinic. If you do not have a mask, one will be given to you upon arrival. For doctor visits, patients may have 1 support person aged 70 or older with them. For treatment visits, patients cannot have anyone with them due to current Covid guidelines and our immunocompromised population.

## 2020-12-10 ENCOUNTER — Telehealth: Payer: Self-pay

## 2020-12-10 NOTE — Telephone Encounter (Signed)
This nurse reached out to patient related to scheduling CT scan.  Left a message for patient to reach out to central scheduling to set a date and time that is best for her.  Advised that a follow up appointment with the provider will be needed 1-2 business day following the scan.  Patient knows to call clinic if there are any questions or concerns.

## 2020-12-13 ENCOUNTER — Ambulatory Visit: Payer: Self-pay

## 2020-12-13 ENCOUNTER — Telehealth: Payer: Self-pay

## 2020-12-13 NOTE — Telephone Encounter (Signed)
This nurse spoke with patient earlier who stated that she attempted to have her CT scan scheduled for 8/24 and she noticed that the appointment is still not showing on MyChart.  This nurse advised that she would look into it.  This nurse spoke with central scheduling who was able to schedule CT scan for 8/24 @ 10 am.  Called patient and left a message with appointment time per patient request.  Patient knows to call clinic with any questions or concerns.

## 2020-12-28 ENCOUNTER — Telehealth: Payer: Self-pay

## 2020-12-28 NOTE — Telephone Encounter (Signed)
Patient called and left message stating she just wanted to notify MD that she has a red rash on BLE.  Patient states that it is not itching and not irritating but she called because she was advised to call if she noticed a rash.  Patient states that she does not need anything called in for it because it is not bad at all.  This nurse called and spoke with patient who stated that she was doing fine and denies any pain or other symptoms.  No further questions or concerns.

## 2020-12-28 NOTE — Telephone Encounter (Signed)
Patient called and stated that she no longer wants to take the [facility]. Patient states that she has a rash on BLE from knees to thighs, under her arms and down her right arm from shoulder down to her elbow.  Patient also states that she is now having small blisters pop up sporadically on her legs and buttocks.  Patient states that she will take the next dose scheduled for 8/24 but if the rash does not subside by her next scheduled dose in September she does not want to take the [facility].  This nurse informed patient the provider will be made aware.

## 2020-12-29 ENCOUNTER — Inpatient Hospital Stay: Payer: Medicare Other

## 2020-12-29 ENCOUNTER — Other Ambulatory Visit: Payer: Self-pay

## 2020-12-29 ENCOUNTER — Ambulatory Visit (HOSPITAL_COMMUNITY)
Admission: RE | Admit: 2020-12-29 | Discharge: 2020-12-29 | Disposition: A | Discharge status: Home / Self Care | Payer: Medicare Other | Source: Ambulatory Visit | Attending: Hematology | Admitting: Hematology

## 2020-12-29 DIAGNOSIS — C50412 Malignant neoplasm of upper-outer quadrant of left female breast: Secondary | ICD-10-CM | POA: Insufficient documentation

## 2020-12-29 DIAGNOSIS — Z95828 Presence of other vascular implants and grafts: Secondary | ICD-10-CM

## 2020-12-29 DIAGNOSIS — Z171 Estrogen receptor negative status [ER-]: Secondary | ICD-10-CM | POA: Insufficient documentation

## 2020-12-29 DIAGNOSIS — Z5112 Encounter for antineoplastic immunotherapy: Secondary | ICD-10-CM | POA: Diagnosis not present

## 2020-12-29 LAB — CBC WITH DIFFERENTIAL (CANCER CENTER ONLY)
Abs Immature Granulocytes: 0.01 10*3/uL (ref 0.00–0.07)
Basophils Absolute: 0 10*3/uL (ref 0.0–0.1)
Basophils Relative: 1 %
Eosinophils Absolute: 0.2 10*3/uL (ref 0.0–0.5)
Eosinophils Relative: 3 %
HCT: 34.6 % — ABNORMAL LOW (ref 36.0–46.0)
Hemoglobin: 11.9 g/dL — ABNORMAL LOW (ref 12.0–15.0)
Immature Granulocytes: 0 %
Lymphocytes Relative: 29 %
Lymphs Abs: 1.4 10*3/uL (ref 0.7–4.0)
MCH: 32.7 pg (ref 26.0–34.0)
MCHC: 34.4 g/dL (ref 30.0–36.0)
MCV: 95.1 fL (ref 80.0–100.0)
Monocytes Absolute: 0.4 10*3/uL (ref 0.1–1.0)
Monocytes Relative: 9 %
Neutro Abs: 2.8 10*3/uL (ref 1.7–7.7)
Neutrophils Relative %: 58 %
Platelet Count: 165 10*3/uL (ref 150–400)
RBC: 3.64 MIL/uL — ABNORMAL LOW (ref 3.87–5.11)
RDW: 13.4 % (ref 11.5–15.5)
WBC Count: 4.8 10*3/uL (ref 4.0–10.5)
nRBC: 0 % (ref 0.0–0.2)

## 2020-12-29 LAB — CMP (CANCER CENTER ONLY)
ALT: 17 U/L (ref 0–44)
AST: 40 U/L (ref 15–41)
Albumin: 3.4 g/dL — ABNORMAL LOW (ref 3.5–5.0)
Alkaline Phosphatase: 73 U/L (ref 38–126)
Anion gap: 8 (ref 5–15)
BUN: 15 mg/dL (ref 8–23)
CO2: 26 mmol/L (ref 22–32)
Calcium: 8.8 mg/dL — ABNORMAL LOW (ref 8.9–10.3)
Chloride: 106 mmol/L (ref 98–111)
Creatinine: 0.65 mg/dL (ref 0.44–1.00)
GFR, Estimated: >60 mL/min (ref >60)
Glucose, Bld: 99 mg/dL (ref 70–99)
Potassium: 4.2 mmol/L (ref 3.5–5.1)
Sodium: 140 mmol/L (ref 135–145)
Total Bilirubin: 0.3 mg/dL (ref 0.3–1.2)
Total Protein: 5.6 g/dL — ABNORMAL LOW (ref 6.5–8.1)

## 2020-12-29 MED ORDER — HEPARIN SOD (PORK) LOCK FLUSH 100 UNIT/ML IV SOLN
500.0000 [IU] | Freq: Once | INTRAVENOUS | Status: AC
  Administered 2020-12-29: 500 [IU] via INTRAVENOUS

## 2020-12-29 MED ORDER — IOHEXOL 350 MG/ML SOLN
80.0000 mL | Freq: Once | INTRAVENOUS | Status: AC | PRN
  Administered 2020-12-29: 80 mL via INTRAVENOUS

## 2020-12-29 MED ORDER — SODIUM CHLORIDE 0.9% FLUSH
10.0000 mL | Freq: Once | INTRAVENOUS | Status: AC
  Administered 2020-12-29: 10 mL

## 2020-12-29 MED ORDER — HEPARIN SOD (PORK) LOCK FLUSH 100 UNIT/ML IV SOLN
INTRAVENOUS | Status: AC
  Filled 2020-12-29: qty 5

## 2021-01-10 ENCOUNTER — Encounter: Payer: Self-pay | Admitting: Nurse Practitioner

## 2021-01-18 NOTE — Progress Notes (Signed)
Lindsay [name]   Telephone:([phone]) [phone] Fax:(336) [phone]   Clinic Follow up Note   Patient Care Team: Scotty Court, DO as PCP - General Mauro Kaufmann, RN as Oncology Nurse Navigator Rockwell Germany, RN as Oncology Nurse Navigator [name], MD as Consulting Physician (General Surgery) Truitt Merle, MD as Consulting Physician (Hematology) Eppie Gibson, MD as Attending Physician (Radiation Oncology) 01/19/2021  CHIEF COMPLAINT: Follow up left breast metaplastic cancer, triple negative   SUMMARY OF ONCOLOGIC HISTORY: Oncology History Overview Note  Cancer Staging Malignant neoplasm of upper-outer quadrant of left breast in female, estrogen receptor negative ([place]) Staging form: Breast, AJCC 8th Edition - Clinical stage from 10/28/2019: Stage IIIB (cT2, cN1, cM0, G3, ER-, PR-, HER2-) - Signed by Eppie Gibson, MD on 11/05/2019 - Pathologic stage from 12/26/2019: Stage IIIC (pT3, pN3a, cM0, G3, ER-, PR-, HER2-) - Signed by Truitt Merle, MD on 01/28/2020    Malignant neoplasm of upper-outer quadrant of left breast in female, estrogen receptor negative ([place])  10/27/2019 Mammogram   IMPRESSION: 1. Right breast calcifications spanning 2.6 cm in the upper slightly medial breast are indeterminate.   2. Left breast dominant mass at 3 o'clock, 6 cm from nipple measuring 3x3.9x2.9 cm is highly suspicious. There is overlying skin thickening, skin involvement is not excluded.   3. Left breast mass/distorted tissue at 1 o'clock, 4cm from nipple measuring 3.0x1.2x2.9 cm is likely in contiguity with the dominant mass at 3 o'clock and is also highly suspicious. With the dominant lesion the overall span a disease is approximately 6 cm.   4. Left breast distortion at 2 o'clock 10 cm from the nipple is Suspicious, measuring 1.0 x 0.8 cm.   5.  Abnormal left axillary lymph nodes (2). Cortical thickness measures up to 0.6 cm.    10/28/2019 Initial Biopsy   Diagnosis 1. Breast,  left, needle core biopsy, 3 o'clock, 5cmfn - INVASIVE MAMMARY CARCINOMA. 2. Breast, left, needle core biopsy, 2 o'clock, 10cmfn - INVASIVE MAMMARY CARCINOMA. 3. 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 Microscopic Comment 1. -3. Overall, immunohistochemistry favors a pronounced desmoplastic response, but metaplastic carcinoma cannot be excluded (Epithelioid component: CKAE1AE3 strong +, CK5/6 weak +). The greatest linear extent of tumor in any one core in specimen 1 is 14 mm. The greatest linear extent of tumor in any one core in specimen 2 is 16 mm.   10/28/2019 Receptors her2   3. PROGNOSTIC INDICATORS Results: IMMUNOHISTOCHEMICAL AND MORPHOMETRIC ANALYSIS PERFORMED MANUALLY The tumor cells are EQUIVOCAL for Her2 (2+). Her2 by FISH will be performed and results reported separately. Estrogen Receptor: 0%, NEGATIVE Progesterone Receptor: 0%, NEGATIVE Proliferation Marker Ki67: 10%    3. FLUORESCENCE IN-SITU HYBRIDIZATION Results: GROUP 5: HER2 **NEGATIVE** Equivocal form of amplification of the HER2 gene was detected in the IHC 2+ tissue sample received from this individual. HER2 FISH was performed by a technologist and cell imaging and analysis on the [employer].   10/28/2019 Cancer Staging   Staging form: Breast, AJCC 8th Edition - Clinical stage from 10/28/2019: Stage IIIB (cT2, cN1, cM0, G3, ER-, PR-, HER2-) - Signed by Eppie Gibson, MD on 11/05/2019   10/31/2019 Initial Diagnosis   Malignant neoplasm of upper-outer quadrant of left breast in female, estrogen receptor negative ([place])   10/31/2019 Pathology Results   Diagnosis Breast, right, needle core biopsy, UOQ, posterior - FOCAL USUAL DUCTAL HYPERPLASIA AND FIBROCYSTIC CHANGES WITH CALCIFICATIONS - FIBROADENOMATOID CHANGES - NO MALIGNANCY IDENTIFIED  Microscopic Comment These results were called to [facility] on November 03, 2019.   11/05/2019 Genetic  Testing   She declined Genetic testing    11/21/2019 Imaging   CT CAP w contrast  IMPRESSION: 1. Left breast mass and prominent left axillary lymph nodes, containing biopsy marking clips, consistent with newly diagnosed breast malignancy. 2. No definite evidence of distant metastatic disease in the chest, abdomen, or pelvis. 3. There are occasional small pulmonary nodules, measuring 2-3 mm, most likely incidental sequelae of prior infection or inflammation although metastatic disease is not excluded. Attention on follow-up. 4. Hepatic steatosis. 5. Aortic Atherosclerosis (ICD10-I70.0).     11/21/2019 Imaging   Bone Scan Whole Body IMPRESSION: 1. Single focus of uptake associated with a subacute fracture of LEFT anterior fourth rib. 2. No additional areas of abnormal uptake.   12/26/2019 Cancer Staging   Staging form: Breast, AJCC 8th Edition - Pathologic stage from 12/26/2019: Stage IIIC (pT3, pN3a, cM0, G3, ER-, PR-, HER2-) - Signed by Truitt Merle, MD on 01/28/2020   12/26/2019 Surgery   LEFT MASTECTOMY MODIFIED RADICAL and PAC Placement by Dr Marlou Starks   12/26/2019 Pathology Results   FINAL MICROSCOPIC DIAGNOSIS:   A. 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  - Ductal carcinoma in situ, high nuclear grade with central necrosis.  - Margins of resection:  - Metaplastic carcinoma focally involves the anterior margin and is < 1  mm from the posterior margin.  - DCIS is < 1 mm from the anterior margin.  - Metastatic carcinoma in (13) of (16) lymph nodes with extranodal  extension.  - Biopsy clip sites in breast and one lymph node.  - See oncology table.    ADDENDUM:  PROGNOSTIC INDICATOR RESULTS:  Immunohistochemical and morphometric analysis performed manually  The tumor cells are EQUIVOCAL for Her2 (2+). Her2 FISH has been ordered  and will be reported in an addendum.  Estrogen  Receptor:       NEGATIVE  Progesterone Receptor:   NEGATIVE  Proliferation Marker Ki-67:   30%    ADDENDUM:  FLOURESCENCE IN-SITU HYBRIDIZATION RESULTS:  GROUP 5:   HER2 NEGATIVE   01/28/2020 Echocardiogram   Baseline Echo  IMPRESSIONS     1. Left ventricular ejection fraction, by estimation, is 60 to 65%. The  left ventricle has normal function. The left ventricle has no regional  wall motion abnormalities. Left ventricular diastolic parameters are  consistent with Grade I diastolic  dysfunction (impaired relaxation). The average left ventricular global  longitudinal strain is -19.8 %.   2. Right ventricular systolic function is normal. The right ventricular  size is normal. Tricuspid regurgitation signal is inadequate for assessing  PA pressure.   3. The mitral valve is normal in structure. No evidence of mitral valve  regurgitation. No evidence of mitral stenosis.   4. The aortic valve is normal in structure. Aortic valve regurgitation is  not visualized. No aortic stenosis is present.   5. The inferior vena cava is normal in size with greater than 50%  respiratory variability, suggesting right atrial pressure of 3 mmHg.    02/05/2020 -  Chemotherapy   Keytruda q3weeks (to be taken for 1 whole year) with weekly Carboplatin/Taxol for 12 weeks starting 02/05/20-05/13/20 followed by Adriamycin/Cytoxan q2weeks X4 starting 05/20/20-07/13/20.  ----Continue Keytruda q3weeks from 08/03/20 to complete 1 year of treatment from 02/05/20).    06/17/2020 Breast US  FINDINGS: On physical exam,well-healed scars of LEFT mastectomy and LEFT axillary node dissection. I palpate no axillary mass. Ultrasound is performed, showing normal appearing LEFT axillary contents. No mass or enlarged lymph nodes. IMPRESSION: Ultrasound is negative for LEFT axillary adenopathy.   08/24/2020 - 10/05/2020 Radiation Therapy   Adjuvant RT per Dr. Isidore Moos starting 08/24/20-10/05/20     CURRENT THERAPY: Adjuvant  Keytruda q3weeks from 08/03/20 to complete 1 year of treatment from 02/05/20.   INTERVAL HISTORY: Ms. Arvanitis returns for follow up and treatment as scheduled. She was last seen by Dr. Burr Medico 12/06/20 and completed another cycle of pembrolizumab. She developed a rash to BLE's and canceled treatment on 8/24. Has been applying home remedies, topicals, and a poison oak treatment that burned. Today, rash is mainly on top of thighs, few on her lower legs and right upper arm. It does not itch. Other side effects from [place] include mouth sores that flare periodically and occasional UTI-like symptoms. She takes cranberry pills that work. Otherwise, she has felt better in the past month that she has in the past year.    All other systems were reviewed with the patient and are negative.  MEDICAL HISTORY:  Past Medical History:  Diagnosis Date   Bronchitis    Family history of bone cancer    Family history of breast cancer    Family history of prostate cancer    Fibromyalgia    HCV (hepatitis C virus)    MRSA (methicillin resistant Staphylococcus aureus) 2012    SURGICAL HISTORY: Past Surgical History:  Procedure Laterality Date   MASTECTOMY MODIFIED RADICAL Left 12/26/2019   Procedure: LEFT MASTECTOMY MODIFIED RADICAL;  Surgeon: [name], MD;  Location: [facility];  Service: General;  Laterality: Left;  PEC BLOCK   PORTACATH PLACEMENT Right 12/26/2019   Procedure: INSERTION PORT-A-CATH WITH ULTRASOUND GUIDANCE;  Surgeon: [name], MD;  Location: [facility];  Service: General;  Laterality: Right;    I have reviewed the social history and family history with the patient and they are unchanged from previous note.  ALLERGIES:  is allergic to codeine.  MEDICATIONS:  Current Outpatient Medications  Medication Sig Dispense Refill   acetaminophen (TYLENOL) 650 MG CR tablet Take 650 mg by mouth every 8 (eight) hours as needed for pain.      ascorbic acid (VITAMIN C) 250 MG CHEW Chew by mouth.      dexamethasone (DECADRON) 0.5 MG/5ML solution Take 10 ml (1 mg) swish for 2 minutes and then spit.  Do this four times daily as needed Do Not eat or drink for 1 hour after. 240 mL 0   diphenoxylate-atropine (LOMOTIL) 2.5-0.025 MG tablet Take 1-2 tablets by mouth 4 (four) times daily as needed for diarrhea or loose stools. 30 tablet 1   gabapentin (NEURONTIN) 300 MG capsule Take 300 mg by mouth 3 (three) times daily as needed.     lidocaine (XYLOCAINE) 2 % solution Use as directed 15 mLs in the mouth or throat every 4 (four) hours as needed for mouth pain. 100 mL 2   lidocaine-prilocaine (EMLA) cream Apply 1 application topically as needed. 30 g 2   methocarbamol (ROBAXIN) 750 MG tablet Take 1 tablet (750 mg total) by mouth 4 (four) times daily as needed (use for muscle cramps/pain). 30 tablet 2   mucosal barrier oral (GELCLAIR) GEL Take 1 packet by mouth as needed.     omeprazole (PRILOSEC) 20 MG capsule Take 1 capsule (20 mg total) by mouth daily.  30 capsule 2   valACYclovir (VALTREX) 1000 MG tablet Take 1 tablet (1,000 mg total) by mouth 2 (two) times daily. 14 tablet 1   No current facility-administered medications for this visit.    PHYSICAL EXAMINATION: ECOG PERFORMANCE STATUS: 1 - Symptomatic but completely ambulatory  Vitals:   01/19/21 1012  BP: 133/69  Pulse: 68  Resp: 17  Temp: (!) 97.5 F (36.4 C)  SpO2: 100%   Filed Weights   01/19/21 1012  Weight: 138 lb 8 oz (62.8 kg)    GENERAL:alert, no distress and comfortable SKIN: mild maculopapular rash to anterior thighs, RUE, and few on lower legs  EYES:  sclera clear. See image  LYMPH:  no palpable cervical or supraclavicular or left axillary lymphadenopathy LUNGS: clear with normal breathing effort HEART: regular rate & rhythm, no lower extremity edema NEURO: alert & oriented x 3 with fluent speech, no focal motor/sensory deficits PAC without erythema     LABORATORY DATA:  I have reviewed the data as listed CBC Latest  Ref Rng & Units 01/19/2021 12/29/2020 12/06/2020  WBC 4.0 - 10.5 K/uL 4.4 4.8 3.9(L)  Hemoglobin 12.0 - 15.0 g/dL 11.6(L) 11.9(L) 12.1  Hematocrit 36.0 - 46.0 % 34.7(L) 34.6(L) 35.3(L)  Platelets 150 - 400 K/uL 138(L) 165 170     CMP Latest Ref Rng & Units 01/19/2021 12/29/2020 12/06/2020  Glucose 70 - 99 mg/dL 117(H) 99 92  BUN 8 - 23 mg/dL '16 15 17  ' Creatinine 0.44 - 1.00 mg/dL 0.70 0.65 0.68  Sodium 135 - 145 mmol/L 140 140 139  Potassium 3.5 - 5.1 mmol/L 4.0 4.2 4.2  Chloride 98 - 111 mmol/L 108 106 107  CO2 22 - 32 mmol/L '26 26 27  ' Calcium 8.9 - 10.3 mg/dL 8.7(L) 8.8(L) 8.8(L)  Total Protein 6.5 - 8.1 g/dL 5.4(L) 5.6(L) 5.5(L)  Total Bilirubin 0.3 - 1.2 mg/dL 0.4 0.3 0.4  Alkaline Phos 38 - 126 U/L 79 73 77  AST 15 - 41 U/L 45(H) 40 35  ALT 0 - 44 U/L '22 17 15      ' RADIOGRAPHIC STUDIES: I have personally reviewed the radiological images as listed and agreed with the findings in the report. No results found.   ASSESSMENT & PLAN: Lindsay [name] is a 65 y.o. female with    1. 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 Her 11/2019 CT CAP and bone scan was negative for distant metastasis.  -S/p left mastectomy with Dr Marlou Starks on 12/26/19. Surgical path showed 6cm multifocal metaplastic carcinoma and 13/16 positive LN but unfortunately 1 positive LN was not able to be removed due to vascular invasion.  -She was treated with adjuvant chemo carbo/taxol from 02/05/20 - 05/13/20 followed by Adriamycin/cytoxan 05/20/20 - 07/13/20 along with Ballard Russell to complete 1 year.  She tolerated AC poorly with mucositis, weight loss, constipation, and weakness. She is recovering well -interim left axillary [place] 06/17/20 is negative for adenopathy, c/w response to adjuvant chemo -She  completed adjuvant radiation with Dr. Isidore Moos 08/24/20 - 10/05/20, she has recovered well  -restaging CT CAP 12/29/20 shows no evidence of residual disease or adenopathy, or metastatic disease.  -plan to complete final pembrolizumab today  -due to triple negative disease, she is not a candidate for anti-estrogen therapy.  -proceed with surveillance    3. Skin rash  -Developed 12/2020,  after almost 1 year on pembrolizumab -patient also works on a farm and as a Psychiatric nurse -etiology unknown -if immune-mediated, may worsen after today's final dose pembro. Monitoring   3. G3 mucositis and weight loss -she developed mucositis after cycle 2 AC and lost 8 lbs. However, she has lost close to 20 lbs on AC and 30 lbs since starting chemo in 01/2020 -declined appetite stimulant or dietician.  -continue oral supportive care  -she has periodic mucositis flares on chemo and immunotherapy alone; uses gelclair and dex swish/spit sparingly. Knows to avoid ingesting steroids on pembro   4. Hepatitis C, untreated, diagnosed in 1992 -pending referral to [facility] or [facility] for treatment after she completed chemo.  -monitoring LFTs and for possible hep C flare while on chemo/immunotherapy -AST became elevated in 06/2020, 42-49 range, stable   5. Social and financial support  -She has no children and no close relatives. She does have close friends and church community who are supportive and strong faith.  -She did not have medical insurance for many years. She recently applied for blue cross and blue shield which is not effective yet.    6. Mild anemia -Secondary to chemotherapy, mild, will continue monitoring. -Stable    Disposition:  Ms. Purtle appears stable. She continues q3 week pembrolizumab, last dose 6 weeks ago due to skin rash. She tolerates treatment well with improved PS off chemo. I reviewed her CT CAP which is negative for residual or recurrent disease.   The etiology of her rash is unknown. She is a  Psychiatric nurse and works on her farm. We discussed it could be environmental vs drug rash from pembro. It is mild and well managed.   Labs reviewed, adequate to proceed with final pembro today as planned. We discussed if this is immune-related rash, it may worsen after treatment. She agrees to proceed and monitor. She knows to call if it worsens.   After today's final treatment she will proceed with surveillance. She will return for lab/flush and f/up to monitor rash in 6 weeks, then flush (unless PAC is removed prior) and Survivorship in 12 weeks. Will see her sooner if rash worsens or fails to improve.  The plan was reviewed with Dr. Burr Medico.   All questions were answered. The patient knows to call the clinic with any problems, questions or concerns. No barriers to learning were detected. Total encounter time was 30 minutes.      Alla Feeling, NP 01/19/21

## 2021-01-19 ENCOUNTER — Inpatient Hospital Stay (HOSPITAL_BASED_OUTPATIENT_CLINIC_OR_DEPARTMENT_OTHER): Payer: Medicare Other | Admitting: Nurse Practitioner

## 2021-01-19 ENCOUNTER — Inpatient Hospital Stay: Payer: Medicare Other

## 2021-01-19 ENCOUNTER — Encounter: Payer: Self-pay | Admitting: Nurse Practitioner

## 2021-01-19 ENCOUNTER — Inpatient Hospital Stay: Payer: Medicare Other | Attending: Nurse Practitioner

## 2021-01-19 ENCOUNTER — Other Ambulatory Visit: Payer: Self-pay

## 2021-01-19 VITALS — BP 133/69 | HR 68 | Temp 97.5°F | Resp 17 | Ht 61.0 in | Wt 138.5 lb

## 2021-01-19 DIAGNOSIS — Z171 Estrogen receptor negative status [ER-]: Secondary | ICD-10-CM

## 2021-01-19 DIAGNOSIS — B1920 Unspecified viral hepatitis C without hepatic coma: Secondary | ICD-10-CM | POA: Diagnosis not present

## 2021-01-19 DIAGNOSIS — C50412 Malignant neoplasm of upper-outer quadrant of left female breast: Secondary | ICD-10-CM

## 2021-01-19 DIAGNOSIS — Z5112 Encounter for antineoplastic immunotherapy: Secondary | ICD-10-CM | POA: Diagnosis not present

## 2021-01-19 DIAGNOSIS — K1231 Oral mucositis (ulcerative) due to antineoplastic therapy: Secondary | ICD-10-CM | POA: Insufficient documentation

## 2021-01-19 DIAGNOSIS — R21 Rash and other nonspecific skin eruption: Secondary | ICD-10-CM | POA: Insufficient documentation

## 2021-01-19 DIAGNOSIS — Z95828 Presence of other vascular implants and grafts: Secondary | ICD-10-CM

## 2021-01-19 DIAGNOSIS — D6481 Anemia due to antineoplastic chemotherapy: Secondary | ICD-10-CM | POA: Insufficient documentation

## 2021-01-19 LAB — CMP (CANCER CENTER ONLY)
ALT: 22 U/L (ref 0–44)
AST: 45 U/L — ABNORMAL HIGH (ref 15–41)
Albumin: 3.3 g/dL — ABNORMAL LOW (ref 3.5–5.0)
Alkaline Phosphatase: 79 U/L (ref 38–126)
Anion gap: 6 (ref 5–15)
BUN: 16 mg/dL (ref 8–23)
CO2: 26 mmol/L (ref 22–32)
Calcium: 8.7 mg/dL — ABNORMAL LOW (ref 8.9–10.3)
Chloride: 108 mmol/L (ref 98–111)
Creatinine: 0.7 mg/dL (ref 0.44–1.00)
GFR, Estimated: >60 mL/min (ref >60)
Glucose, Bld: 117 mg/dL — ABNORMAL HIGH (ref 70–99)
Potassium: 4 mmol/L (ref 3.5–5.1)
Sodium: 140 mmol/L (ref 135–145)
Total Bilirubin: 0.4 mg/dL (ref 0.3–1.2)
Total Protein: 5.4 g/dL — ABNORMAL LOW (ref 6.5–8.1)

## 2021-01-19 LAB — CBC WITH DIFFERENTIAL (CANCER CENTER ONLY)
Abs Immature Granulocytes: 0.01 10*3/uL (ref 0.00–0.07)
Basophils Absolute: 0 10*3/uL (ref 0.0–0.1)
Basophils Relative: 1 %
Eosinophils Absolute: 0.1 10*3/uL (ref 0.0–0.5)
Eosinophils Relative: 3 %
HCT: 34.7 % — ABNORMAL LOW (ref 36.0–46.0)
Hemoglobin: 11.6 g/dL — ABNORMAL LOW (ref 12.0–15.0)
Immature Granulocytes: 0 %
Lymphocytes Relative: 28 %
Lymphs Abs: 1.3 10*3/uL (ref 0.7–4.0)
MCH: 32 pg (ref 26.0–34.0)
MCHC: 33.4 g/dL (ref 30.0–36.0)
MCV: 95.6 fL (ref 80.0–100.0)
Monocytes Absolute: 0.4 10*3/uL (ref 0.1–1.0)
Monocytes Relative: 10 %
Neutro Abs: 2.6 10*3/uL (ref 1.7–7.7)
Neutrophils Relative %: 58 %
Platelet Count: 138 10*3/uL — ABNORMAL LOW (ref 150–400)
RBC: 3.63 MIL/uL — ABNORMAL LOW (ref 3.87–5.11)
RDW: 13.5 % (ref 11.5–15.5)
WBC Count: 4.4 10*3/uL (ref 4.0–10.5)
nRBC: 0 % (ref 0.0–0.2)

## 2021-01-19 MED ORDER — SODIUM CHLORIDE 0.9% FLUSH
10.0000 mL | Freq: Once | INTRAVENOUS | Status: AC
  Administered 2021-01-19: 10 mL

## 2021-01-19 MED ORDER — SODIUM CHLORIDE 0.9 % IV SOLN: Freq: Once | INTRAVENOUS | Status: AC

## 2021-01-19 MED ORDER — SODIUM CHLORIDE 0.9 % IV SOLN
200.0000 mg | Freq: Once | INTRAVENOUS | Status: AC
  Administered 2021-01-19: 200 mg via INTRAVENOUS
  Filled 2021-01-19: qty 8

## 2021-01-19 NOTE — Patient Instructions (Addendum)
[facility]   Happy Last Treatment Day! Discharge Instructions: Thank you for choosing [facility] to provide your oncology and hematology care.   If you have a lab appointment with the [facility], please go directly to the [facility] and check in at the registration area.   Wear comfortable clothing and clothing appropriate for easy access to any Portacath or PICC line.   We strive to give you quality time with your provider. You may need to reschedule your appointment if you arrive late (15 or more minutes).  Arriving late affects you and other patients whose appointments are after yours.  Also, if you miss three or more appointments without notifying the office, you may be dismissed from the clinic at the provider's discretion.      For prescription refill requests, have your pharmacy contact our office and allow 72 hours for refills to be completed.    Today you received the following chemotherapy and/or immunotherapy agents: Pembrolizumab Beryle Flock)      To help prevent nausea and vomiting after your treatment, we encourage you to take your nausea medication as directed.  BELOW ARE SYMPTOMS THAT SHOULD BE REPORTED IMMEDIATELY: *FEVER GREATER THAN 100.4 F (38 C) OR HIGHER *CHILLS OR SWEATING *NAUSEA AND VOMITING THAT IS NOT CONTROLLED WITH YOUR NAUSEA MEDICATION *UNUSUAL SHORTNESS OF BREATH *UNUSUAL BRUISING OR BLEEDING *URINARY PROBLEMS (pain or burning when urinating, or frequent urination) *BOWEL PROBLEMS (unusual diarrhea, constipation, pain near the anus) TENDERNESS IN MOUTH AND THROAT WITH OR WITHOUT PRESENCE OF ULCERS (sore throat, sores in mouth, or a toothache) UNUSUAL RASH, SWELLING OR PAIN  UNUSUAL VAGINAL DISCHARGE OR ITCHING   Items with * indicate a potential emergency and should be followed up as soon as possible or go to the Emergency Department if any problems should occur.  Please show the CHEMOTHERAPY ALERT CARD  or IMMUNOTHERAPY ALERT CARD at check-in to the Emergency Department and triage nurse.  Should you have questions after your visit or need to cancel or reschedule your appointment, please contact [facility]  Dept: [phone]  and follow the prompts.  Office hours are 8:00 a.m. to 4:30 p.m. Monday - Friday. Please note that voicemails left after 4:00 p.m. may not be returned until the following business day.  We are closed weekends and major holidays. You have access to a nurse at all times for urgent questions. Please call the main number to the clinic Dept: [phone] and follow the prompts.   For any non-urgent questions, you may also contact your provider using MyChart. We now offer e-Visits for anyone 55 and older to request care online for non-urgent symptoms. For details visit [email or web address].   Also download the MyChart app! Go to the app store, search "MyChart", open the app, select , and log in with your MyChart username and password.  Due to Covid, a mask is required upon entering the hospital/clinic. If you do not have a mask, one will be given to you upon arrival. For doctor visits, patients may have 1 support person aged 59 or older with them. For treatment visits, patients cannot have anyone with them due to current Covid guidelines and our immunocompromised population.

## 2021-01-20 ENCOUNTER — Encounter: Payer: Self-pay | Admitting: *Deleted

## 2021-01-20 DIAGNOSIS — C50412 Malignant neoplasm of upper-outer quadrant of left female breast: Secondary | ICD-10-CM

## 2021-02-02 ENCOUNTER — Encounter: Payer: Self-pay | Admitting: Nurse Practitioner

## 2021-02-02 ENCOUNTER — Encounter: Payer: Self-pay | Admitting: Hematology

## 2021-02-03 ENCOUNTER — Telehealth: Payer: Self-pay

## 2021-02-03 NOTE — Telephone Encounter (Signed)
This nurse spoke with patient who stated that she her throat is sore, cough and is not running a fever, states that her temperature appears to be dropping instead of rising.  Symptoms started on yesterday morning.   Patient has tested positive for Covid.  Just wanted to inform the MD that she went to [facility] urgent care and they are giving her a prescription for Paxlovid.  Patient had no other concerns or questions at this time.  MD made aware.

## 2021-02-07 ENCOUNTER — Encounter: Payer: Self-pay | Admitting: Hematology

## 2021-02-08 ENCOUNTER — Other Ambulatory Visit: Payer: Self-pay

## 2021-02-08 ENCOUNTER — Telehealth: Payer: Self-pay

## 2021-02-08 NOTE — Telephone Encounter (Signed)
This patient called in and stated that she has now tested negative for Covid.  She states that she is still having some cold symptoms and now concerned about exacerbation of her Chronic Bronchitis.  Patient would like to know if a prescription for Amoxicillin like she usually takes can be called in to [facility]. This nurse informed that her request will be forwarded to MD.  No further questions or concerns at this time.

## 2021-02-15 ENCOUNTER — Encounter: Payer: Self-pay | Admitting: Hematology

## 2021-03-02 ENCOUNTER — Encounter: Payer: Self-pay | Admitting: Hematology

## 2021-03-02 ENCOUNTER — Inpatient Hospital Stay: Payer: Medicare Other

## 2021-03-02 ENCOUNTER — Inpatient Hospital Stay: Payer: Medicare Other | Attending: Nurse Practitioner | Admitting: Hematology

## 2021-03-02 ENCOUNTER — Other Ambulatory Visit: Payer: Self-pay

## 2021-03-02 VITALS — BP 115/73 | HR 65 | Temp 98.2°F | Resp 18 | Ht 61.0 in | Wt 137.4 lb

## 2021-03-02 DIAGNOSIS — R21 Rash and other nonspecific skin eruption: Secondary | ICD-10-CM | POA: Diagnosis not present

## 2021-03-02 DIAGNOSIS — C50412 Malignant neoplasm of upper-outer quadrant of left female breast: Secondary | ICD-10-CM

## 2021-03-02 DIAGNOSIS — B1920 Unspecified viral hepatitis C without hepatic coma: Secondary | ICD-10-CM | POA: Insufficient documentation

## 2021-03-02 DIAGNOSIS — Z853 Personal history of malignant neoplasm of breast: Secondary | ICD-10-CM | POA: Diagnosis not present

## 2021-03-02 DIAGNOSIS — Z171 Estrogen receptor negative status [ER-]: Secondary | ICD-10-CM | POA: Diagnosis not present

## 2021-03-02 DIAGNOSIS — Z452 Encounter for adjustment and management of vascular access device: Secondary | ICD-10-CM | POA: Insufficient documentation

## 2021-03-02 DIAGNOSIS — D6481 Anemia due to antineoplastic chemotherapy: Secondary | ICD-10-CM | POA: Insufficient documentation

## 2021-03-02 DIAGNOSIS — Z95828 Presence of other vascular implants and grafts: Secondary | ICD-10-CM

## 2021-03-02 LAB — CMP (CANCER CENTER ONLY)
ALT: 16 U/L (ref 0–44)
AST: 33 U/L (ref 15–41)
Albumin: 3.4 g/dL — ABNORMAL LOW (ref 3.5–5.0)
Alkaline Phosphatase: 71 U/L (ref 38–126)
Anion gap: 7 (ref 5–15)
BUN: 19 mg/dL (ref 8–23)
CO2: 25 mmol/L (ref 22–32)
Calcium: 8.6 mg/dL — ABNORMAL LOW (ref 8.9–10.3)
Chloride: 108 mmol/L (ref 98–111)
Creatinine: 0.69 mg/dL (ref 0.44–1.00)
GFR, Estimated: >60 mL/min (ref >60)
Glucose, Bld: 98 mg/dL (ref 70–99)
Potassium: 4.2 mmol/L (ref 3.5–5.1)
Sodium: 140 mmol/L (ref 135–145)
Total Bilirubin: 0.3 mg/dL (ref 0.3–1.2)
Total Protein: 5.4 g/dL — ABNORMAL LOW (ref 6.5–8.1)

## 2021-03-02 LAB — CBC WITH DIFFERENTIAL (CANCER CENTER ONLY)
Abs Immature Granulocytes: 0 10*3/uL (ref 0.00–0.07)
Basophils Absolute: 0 10*3/uL (ref 0.0–0.1)
Basophils Relative: 1 %
Eosinophils Absolute: 0.2 10*3/uL (ref 0.0–0.5)
Eosinophils Relative: 4 %
HCT: 35 % — ABNORMAL LOW (ref 36.0–46.0)
Hemoglobin: 11.6 g/dL — ABNORMAL LOW (ref 12.0–15.0)
Immature Granulocytes: 0 %
Lymphocytes Relative: 32 %
Lymphs Abs: 1.1 10*3/uL (ref 0.7–4.0)
MCH: 31.6 pg (ref 26.0–34.0)
MCHC: 33.1 g/dL (ref 30.0–36.0)
MCV: 95.4 fL (ref 80.0–100.0)
Monocytes Absolute: 0.3 10*3/uL (ref 0.1–1.0)
Monocytes Relative: 9 %
Neutro Abs: 2 10*3/uL (ref 1.7–7.7)
Neutrophils Relative %: 54 %
Platelet Count: 128 10*3/uL — ABNORMAL LOW (ref 150–400)
RBC: 3.67 MIL/uL — ABNORMAL LOW (ref 3.87–5.11)
RDW: 13.4 % (ref 11.5–15.5)
WBC Count: 3.6 10*3/uL — ABNORMAL LOW (ref 4.0–10.5)
nRBC: 0 % (ref 0.0–0.2)

## 2021-03-02 MED ORDER — HEPARIN SOD (PORK) LOCK FLUSH 100 UNIT/ML IV SOLN
500.0000 [IU] | Freq: Once | INTRAVENOUS | Status: AC
  Administered 2021-03-02: 500 [IU]

## 2021-03-02 MED ORDER — SODIUM CHLORIDE 0.9% FLUSH
10.0000 mL | Freq: Once | INTRAVENOUS | Status: AC
  Administered 2021-03-02: 10 mL

## 2021-03-02 NOTE — Progress Notes (Signed)
[place]   [phone] [phone]) [phone]   Clinic Follow up Note   Patient Care Team: Scotty Court, DO as PCP - General Mauro Kaufmann, RN as Oncology Nurse Navigator Rockwell Germany, RN as Oncology Nurse Navigator [name], MD as Consulting Physician (General Surgery) Truitt Merle, MD as Consulting Physician (Hematology) Eppie Gibson, MD as Attending Physician (Radiation Oncology)  Date of Service:  03/02/2021  CHIEF COMPLAINT: f/u of left breast metaplastic cancer  CURRENT THERAPY:  Surveillance  ASSESSMENT & PLAN:  Lindsay [name] is a 65 y.o. female with   1. 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 Her 11/2019 CT CAP and bone scan was negative for distant metastasis.  -S/p left mastectomy with Dr Marlou Starks on 12/26/19. Surgical path showed 6cm multifocal metaplastic carcinoma and 13/16 positive LN but unfortunately 1 positive LN was not able to be removed due to vascular invasion.  -She was treated with adjuvant chemo carbo/taxol from 02/05/20 - 05/13/20 followed by Adriamycin/cytoxan 05/20/20 - 07/13/20 along with Ballard Russell through 01/19/21.  She tolerated AC poorly with mucositis, weight loss, constipation, and weakness. She is recovering well -I do not recommend adjuvant Xeloda, I think the benefit is small due to her histology  -interim left axillary [place] 06/17/20 is negative for adenopathy, c/w response to adjuvant chemo -She completed adjuvant radiation with Dr. Isidore Moos 08/24/20 - 10/05/20, she has recovered well  -restaging CT CAP 12/29/20 shows no evidence of residual disease or adenopathy, or metastatic disease.  -she is now under surveillance since completing keytruda 01/2021. I briefly discussed Xeloda, but I do not  feel this will have much benefit. -she will keep her port for now. She will continue to return for flush every 6 weeks.   2. Skin rash  -Developed 12/2020, after almost 1 year on pembrolizumab -patient also works on a farm and as a Psychiatric nurse -etiology unknown -improving, mostly resolved.   3. Hepatitis C, untreated, diagnosed in 1992 -Will refer her to liver clinic [facility] since she has completed chemo  -LFTs overall sable    4. Social and financial support  -She has no children and no close relatives. She does have close friends and church community who are supportive and strong faith.  -She did not have medical insurance for many years. She recently applied for blue cross and blue shield which is not effective yet.    5. Mild anemia -Secondary to chemotherapy, mild, will continue monitoring. -Stable  6. Covid-19+ 02/02/21 -she experienced body aches, fatigue, congestion, cough, and sore throat. -she was treated with azithromysin and paxlovid.   PLAN: -flush in 6 weeks -lab, flush, and f/u in 3 months -referral to liver clinic    No problem-specific Assessment & Plan notes found for this encounter.   SUMMARY OF ONCOLOGIC HISTORY: Oncology History Overview Note  Cancer Staging Malignant neoplasm of upper-outer quadrant of left breast in female, estrogen receptor negative ([place]) Staging form: Breast, AJCC 8th Edition - Clinical stage from 10/28/2019: Stage IIIB (cT2, cN1, cM0, G3, ER-, PR-, HER2-) - Signed by Eppie Gibson, MD on 11/05/2019 - Pathologic stage from 12/26/2019: Stage IIIC (pT3, pN3a, cM0, G3, ER-, PR-, HER2-) - Signed by Truitt Merle, MD on 01/28/2020    Malignant neoplasm of upper-outer quadrant of left breast in female, estrogen receptor negative ([place])  10/27/2019 Mammogram   IMPRESSION: 1. Right breast calcifications spanning 2.6 cm in the upper slightly medial breast are indeterminate.   2. Left breast dominant mass at 3 o'clock, 6 cm from nipple measuring  3x3.9x2.9 cm is highly suspicious. There is overlying skin thickening, skin involvement is not excluded.   3. Left breast mass/distorted tissue at 1 o'clock, 4cm from nipple measuring 3.0x1.2x2.9 cm is likely in contiguity with the dominant mass at 3 o'clock and is also highly suspicious. With the dominant lesion the overall span a disease is approximately 6 cm.   4. Left breast distortion at 2 o'clock 10 cm from the nipple is Suspicious, measuring 1.0 x 0.8 cm.   5.  Abnormal left axillary lymph nodes (2). Cortical thickness measures up to 0.6 cm.    10/28/2019 Initial Biopsy   Diagnosis 1. Breast, left, needle core biopsy, 3 o'clock, 5cmfn - INVASIVE MAMMARY CARCINOMA. 2. Breast, left, needle core biopsy, 2 o'clock, 10cmfn - INVASIVE MAMMARY CARCINOMA. 3. 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 Microscopic Comment 1. -3. Overall, immunohistochemistry favors a pronounced desmoplastic response, but metaplastic carcinoma cannot be excluded (Epithelioid component: CKAE1AE3 strong +, CK5/6 weak +). The greatest linear extent of tumor in any one core in specimen 1 is 14 mm. The greatest linear extent of tumor in any one core in specimen 2 is 16 mm.   10/28/2019 Receptors her2   3. PROGNOSTIC INDICATORS Results: IMMUNOHISTOCHEMICAL AND MORPHOMETRIC ANALYSIS PERFORMED MANUALLY The tumor cells are EQUIVOCAL for Her2 (2+). Her2 by FISH will be performed and results reported separately. Estrogen Receptor: 0%, NEGATIVE Progesterone Receptor: 0%, NEGATIVE Proliferation Marker Ki67: 10%    3. FLUORESCENCE IN-SITU HYBRIDIZATION Results: GROUP 5: HER2 **NEGATIVE** Equivocal form of amplification of the HER2 gene was detected in the IHC 2+ tissue sample received from this individual. HER2 FISH was performed by a technologist and cell imaging and analysis on the [employer].   10/28/2019 Cancer Staging   Staging form: Breast, AJCC 8th  Edition - Clinical stage from 10/28/2019: Stage IIIB (cT2, cN1, cM0, G3, ER-, PR-, HER2-) - Signed by Eppie Gibson, MD on 11/05/2019    10/31/2019 Initial Diagnosis   Malignant neoplasm of upper-outer quadrant of left breast in female, estrogen receptor negative ([place])   10/31/2019 Pathology Results   Diagnosis Breast, right, needle core biopsy, UOQ, posterior - FOCAL USUAL DUCTAL HYPERPLASIA AND FIBROCYSTIC CHANGES WITH CALCIFICATIONS - FIBROADENOMATOID CHANGES - NO MALIGNANCY IDENTIFIED Microscopic Comment These results were called to [facility] on November 03, 2019.   11/05/2019 Genetic Testing   She declined Genetic testing    11/21/2019 Imaging   CT CAP w contrast  IMPRESSION: 1. Left breast mass and prominent left axillary lymph nodes, containing biopsy marking clips, consistent with newly diagnosed breast malignancy. 2. No definite evidence of distant metastatic disease in the chest, abdomen, or pelvis. 3. There are occasional small pulmonary nodules, measuring 2-3 mm, most likely incidental sequelae of prior infection or inflammation although metastatic disease is not excluded. Attention on follow-up. 4. Hepatic steatosis. 5. Aortic Atherosclerosis (ICD10-I70.0).     11/21/2019 Imaging   Bone Scan Whole Body IMPRESSION: 1. Single focus of uptake associated with a subacute fracture of LEFT anterior fourth rib. 2. No additional areas of abnormal uptake.   12/26/2019 Cancer Staging   Staging form: Breast, AJCC 8th Edition - Pathologic stage from 12/26/2019: Stage IIIC (pT3, pN3a, cM0, G3, ER-, PR-, HER2-) - Signed by  Truitt Merle, MD on 01/28/2020    12/26/2019 Surgery   LEFT MASTECTOMY MODIFIED RADICAL and PAC Placement by Dr Marlou Starks   12/26/2019 Pathology Results   FINAL MICROSCOPIC DIAGNOSIS:   A. 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  - Ductal carcinoma in  situ, high nuclear grade with central necrosis.  - Margins of resection:  - Metaplastic carcinoma focally involves the anterior margin and is < 1  mm from the posterior margin.  - DCIS is < 1 mm from the anterior margin.  - Metastatic carcinoma in (13) of (16) lymph nodes with extranodal  extension.  - Biopsy clip sites in breast and one lymph node.  - See oncology table.    ADDENDUM:  PROGNOSTIC INDICATOR RESULTS:  Immunohistochemical and morphometric analysis performed manually  The tumor cells are EQUIVOCAL for Her2 (2+). Her2 FISH has been ordered  and will be reported in an addendum.  Estrogen Receptor:       NEGATIVE  Progesterone Receptor:   NEGATIVE  Proliferation Marker Ki-67:   30%    ADDENDUM:  FLOURESCENCE IN-SITU HYBRIDIZATION RESULTS:  GROUP 5:   HER2 NEGATIVE   01/28/2020 Echocardiogram   Baseline Echo  IMPRESSIONS     1. Left ventricular ejection fraction, by estimation, is 60 to 65%. The  left ventricle has normal function. The left ventricle has no regional  wall motion abnormalities. Left ventricular diastolic parameters are  consistent with Grade I diastolic  dysfunction (impaired relaxation). The average left ventricular global  longitudinal strain is -19.8 %.   2. Right ventricular systolic function is normal. The right ventricular  size is normal. Tricuspid regurgitation signal is inadequate for assessing  PA pressure.   3. The mitral valve is normal in structure. No evidence of mitral valve  regurgitation. No evidence of mitral stenosis.   4. The aortic valve is normal in structure. Aortic valve regurgitation is  not visualized. No aortic stenosis is present.   5. The inferior vena cava is normal in size with greater than 50%  respiratory variability, suggesting right atrial pressure of 3 mmHg.    02/05/2020 -  Chemotherapy   Keytruda q3weeks (to be taken for 1 whole year) with weekly Carboplatin/Taxol for 12 weeks starting 02/05/20-05/13/20 followed  by Adriamycin/Cytoxan q2weeks X4 starting 05/20/20-07/13/20.  ----Continue Keytruda q3weeks from 08/03/20 to complete 1 year of treatment from 02/05/20).    06/17/2020 Breast US   FINDINGS: On physical exam,well-healed scars of LEFT mastectomy and LEFT axillary node dissection. I palpate no axillary mass. Ultrasound is performed, showing normal appearing LEFT axillary contents. No mass or enlarged lymph nodes. IMPRESSION: Ultrasound is negative for LEFT axillary adenopathy.   08/24/2020 - 10/05/2020 Radiation Therapy   Adjuvant RT per Dr. Isidore Moos starting 08/24/20-10/05/20      INTERVAL HISTORY:  Niurka Benecke is here for a follow up of breast cancer. She was last seen by me on 12/06/20 and by NP Lacie in the interim. She presents to the clinic alone. Her hair is growing back in. She reports the rash on her legs has moved down to her shins and has improved. She also notes stiffness in her left arm, for which she continues to stretch/exercise. She reports neuropathy symptoms in her feet, which she notes developed following her shingles infection. She reports the bottom of her feet with burn when she stands on them. She endorses using gabapentin as needed. 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  MEDICAL HISTORY:  Past Medical History:  Diagnosis Date   Bronchitis    Family history of bone cancer    Family history of breast cancer    Family history of prostate cancer    Fibromyalgia    HCV (hepatitis C virus)    MRSA (methicillin resistant Staphylococcus aureus) 2012    SURGICAL HISTORY: Past Surgical History:  Procedure Laterality Date   MASTECTOMY MODIFIED RADICAL Left 12/26/2019   Procedure: LEFT MASTECTOMY MODIFIED RADICAL;  Surgeon: [name], MD;  Location: [facility];  Service: General;   Laterality: Left;  PEC BLOCK   PORTACATH PLACEMENT Right 12/26/2019   Procedure: INSERTION PORT-A-CATH WITH ULTRASOUND GUIDANCE;  Surgeon: [name], MD;  Location: [facility];  Service: General;  Laterality: Right;    I have reviewed the social history and family history with the patient and they are unchanged from previous note.  ALLERGIES:  is allergic to codeine.  MEDICATIONS:  Current Outpatient Medications  Medication Sig Dispense Refill   acetaminophen (TYLENOL) 650 MG CR tablet Take 650 mg by mouth every 8 (eight) hours as needed for pain.      ascorbic acid (VITAMIN C) 250 MG CHEW Chew by mouth.     dexamethasone (DECADRON) 0.5 MG/5ML solution Take 10 ml (1 mg) swish for 2 minutes and then spit.  Do this four times daily as needed Do Not eat or drink for 1 hour after. 240 mL 0   diphenoxylate-atropine (LOMOTIL) 2.5-0.025 MG tablet Take 1-2 tablets by mouth 4 (four) times daily as needed for diarrhea or loose stools. 30 tablet 1   gabapentin (NEURONTIN) 300 MG capsule Take 300 mg by mouth 3 (three) times daily as needed.     lidocaine (XYLOCAINE) 2 % solution Use as directed 15 mLs in the mouth or throat every 4 (four) hours as needed for mouth pain. 100 mL 2   lidocaine-prilocaine (EMLA) cream Apply 1 application topically as needed. 30 g 2   methocarbamol (ROBAXIN) 750 MG tablet Take 1 tablet (750 mg total) by mouth 4 (four) times daily as needed (use for muscle cramps/pain). 30 tablet 2   mucosal barrier oral (GELCLAIR) GEL Take 1 packet by mouth as needed.     omeprazole (PRILOSEC) 20 MG capsule Take 1 capsule (20 mg total) by mouth daily. 30 capsule 2   valACYclovir (VALTREX) 1000 MG tablet Take 1 tablet (1,000 mg total) by mouth 2 (two) times daily. 14 tablet 1   No current facility-administered medications for this visit.    PHYSICAL EXAMINATION: ECOG PERFORMANCE STATUS: 1 - Symptomatic but completely ambulatory  Vitals:   03/02/21 1127  BP: 115/73  Pulse: 65  Resp: 18   Temp: 98.2 F (36.8 C)  SpO2: 100%   Wt Readings from Last 3 Encounters:  03/02/21 62.3 kg  01/19/21 62.8 kg  12/06/20 62.7 kg     GENERAL:alert, no distress and comfortable SKIN: skin color, texture, turgor are normal, no rashes or significant lesions except several scattered papular skin rash on lower extremities  EYES: normal, Conjunctiva are pink and non-injected, sclera clear  NECK: supple, thyroid normal size, non-tender, without nodularity LYMPH:  no palpable lymphadenopathy in the cervical, axillary  LUNGS: clear to auscultation and percussion with normal breathing effort  HEART: regular rate & rhythm and no murmurs and no lower extremity edema ABDOMEN:abdomen soft, non-tender and normal bowel sounds Musculoskeletal:no cyanosis of digits and no clubbing  NEURO: alert & oriented x 3 with fluent speech, no focal motor/sensory deficits BREAST: s/p left mastectomy. No palpable mass, nodules or adenopathy bilaterally. Breast exam benign.   LABORATORY DATA:  I have reviewed the data as listed CBC Latest Ref Rng & Units 03/02/2021 01/19/2021 12/29/2020  WBC 4.0 - 10.5 K/uL 3.6(L) 4.4 4.8  Hemoglobin 12.0 - 15.0 g/dL 11.6(L) 11.6(L) 11.9(L)  Hematocrit 36.0 - 46.0 % 35.0(L) 34.7(L) 34.6(L)  Platelets 150 - 400 K/uL 128(L) 138(L) 165     CMP Latest Ref Rng & Units 03/02/2021 01/19/2021 12/29/2020  Glucose 70 - 99 mg/dL 98 117(H) 99  BUN 8 - 23 mg/dL '19 16 15  ' Creatinine 0.44 - 1.00 mg/dL 0.69 0.70 0.65  Sodium 135 - 145 mmol/L 140 140 140  Potassium 3.5 - 5.1 mmol/L 4.2 4.0 4.2  Chloride 98 - 111 mmol/L 108 108 106  CO2 22 - 32 mmol/L '25 26 26  ' Calcium 8.9 - 10.3 mg/dL 8.6(L) 8.7(L) 8.8(L)  Total Protein 6.5 - 8.1 g/dL 5.4(L) 5.4(L) 5.6(L)  Total Bilirubin 0.3 - 1.2 mg/dL 0.3 0.4 0.3  Alkaline Phos 38 - 126 U/L 71 79 73  AST 15 - 41 U/L 33 45(H) 40  ALT 0 - 44 U/L '16 22 17      ' RADIOGRAPHIC STUDIES: I have personally reviewed the radiological images as listed and agreed  with the findings in the report. No results found.    No orders of the defined types were placed in this encounter.  All questions were answered. The patient knows to call the clinic with any problems, questions or concerns. No barriers to learning was detected. The total time spent in the appointment was 30 minutes.     Truitt Merle, MD 03/02/2021   I, Wilburn Mylar, am acting as scribe for Truitt Merle, MD.   I have reviewed the above documentation for accuracy and completeness, and I agree with the above.

## 2021-03-03 ENCOUNTER — Telehealth: Payer: Self-pay | Admitting: Hematology

## 2021-03-03 NOTE — Telephone Encounter (Signed)
-----   Message from Truitt Merle, MD sent at 03/02/2021  6:55 PM EDT ----- My nurse,  Please call pt to see if she agree with referral to liver clinic NP [facility] for his untreated hep C. If she agrees, please make the referral, thx  YF

## 2021-03-03 NOTE — Telephone Encounter (Signed)
Scheduled follow-up appointment per 10/26 los. Patient is aware. 

## 2021-04-07 ENCOUNTER — Telehealth: Payer: Self-pay | Admitting: *Deleted

## 2021-04-14 ENCOUNTER — Inpatient Hospital Stay: Payer: Medicare Other | Attending: Nurse Practitioner

## 2021-04-14 ENCOUNTER — Inpatient Hospital Stay (HOSPITAL_BASED_OUTPATIENT_CLINIC_OR_DEPARTMENT_OTHER): Payer: Medicare Other | Admitting: Nurse Practitioner

## 2021-04-14 ENCOUNTER — Other Ambulatory Visit: Payer: Self-pay

## 2021-04-14 VITALS — BP 131/71 | HR 65 | Temp 97.7°F | Resp 18 | Ht 61.0 in | Wt 140.3 lb

## 2021-04-14 DIAGNOSIS — Z853 Personal history of malignant neoplasm of breast: Secondary | ICD-10-CM | POA: Insufficient documentation

## 2021-04-14 DIAGNOSIS — C50412 Malignant neoplasm of upper-outer quadrant of left female breast: Secondary | ICD-10-CM | POA: Diagnosis not present

## 2021-04-14 DIAGNOSIS — Z1211 Encounter for screening for malignant neoplasm of colon: Secondary | ICD-10-CM

## 2021-04-14 DIAGNOSIS — Z171 Estrogen receptor negative status [ER-]: Secondary | ICD-10-CM | POA: Diagnosis not present

## 2021-04-14 DIAGNOSIS — Z1231 Encounter for screening mammogram for malignant neoplasm of breast: Secondary | ICD-10-CM | POA: Diagnosis not present

## 2021-04-14 DIAGNOSIS — Z95828 Presence of other vascular implants and grafts: Secondary | ICD-10-CM

## 2021-04-14 LAB — CBC WITH DIFFERENTIAL (CANCER CENTER ONLY)
Abs Immature Granulocytes: 0.01 10*3/uL (ref 0.00–0.07)
Basophils Absolute: 0 10*3/uL (ref 0.0–0.1)
Basophils Relative: 1 %
Eosinophils Absolute: 0.1 10*3/uL (ref 0.0–0.5)
Eosinophils Relative: 2 %
HCT: 33.5 % — ABNORMAL LOW (ref 36.0–46.0)
Hemoglobin: 11.8 g/dL — ABNORMAL LOW (ref 12.0–15.0)
Immature Granulocytes: 0 %
Lymphocytes Relative: 24 %
Lymphs Abs: 1.2 10*3/uL (ref 0.7–4.0)
MCH: 32.8 pg (ref 26.0–34.0)
MCHC: 35.2 g/dL (ref 30.0–36.0)
MCV: 93.1 fL (ref 80.0–100.0)
Monocytes Absolute: 0.5 10*3/uL (ref 0.1–1.0)
Monocytes Relative: 9 %
Neutro Abs: 3.1 10*3/uL (ref 1.7–7.7)
Neutrophils Relative %: 64 %
Platelet Count: 155 10*3/uL (ref 150–400)
RBC: 3.6 MIL/uL — ABNORMAL LOW (ref 3.87–5.11)
RDW: 13 % (ref 11.5–15.5)
WBC Count: 4.9 10*3/uL (ref 4.0–10.5)
nRBC: 0 % (ref 0.0–0.2)

## 2021-04-14 LAB — CMP (CANCER CENTER ONLY)
ALT: 24 U/L (ref 0–44)
AST: 52 U/L — ABNORMAL HIGH (ref 15–41)
Albumin: 3.6 g/dL (ref 3.5–5.0)
Alkaline Phosphatase: 80 U/L (ref 38–126)
Anion gap: 6 (ref 5–15)
BUN: 16 mg/dL (ref 8–23)
CO2: 24 mmol/L (ref 22–32)
Calcium: 8.3 mg/dL — ABNORMAL LOW (ref 8.9–10.3)
Chloride: 105 mmol/L (ref 98–111)
Creatinine: 0.65 mg/dL (ref 0.44–1.00)
GFR, Estimated: >60 mL/min (ref >60)
Glucose, Bld: 102 mg/dL — ABNORMAL HIGH (ref 70–99)
Potassium: 4.1 mmol/L (ref 3.5–5.1)
Sodium: 135 mmol/L (ref 135–145)
Total Bilirubin: 0.3 mg/dL (ref 0.3–1.2)
Total Protein: 5.6 g/dL — ABNORMAL LOW (ref 6.5–8.1)

## 2021-04-14 MED ORDER — SODIUM CHLORIDE 0.9% FLUSH
10.0000 mL | Freq: Once | INTRAVENOUS | Status: AC
  Administered 2021-04-14: 10 mL

## 2021-04-14 MED ORDER — HEPARIN SOD (PORK) LOCK FLUSH 100 UNIT/ML IV SOLN
500.0000 [IU] | Freq: Once | INTRAVENOUS | Status: AC
  Administered 2021-04-14: 500 [IU]

## 2021-04-14 NOTE — Progress Notes (Signed)
CLINIC:  Survivorship   Patient Care Team: Scotty Court, DO as PCP - General Mauro Kaufmann, RN as Oncology Nurse Navigator Rockwell Germany, RN as Oncology Nurse Navigator [name], MD as Consulting Physician (General Surgery) Truitt Merle, MD as Consulting Physician (Hematology) Eppie Gibson, MD as Attending Physician (Radiation Oncology) Alla Feeling, NP as Nurse Practitioner (Nurse Practitioner)  Date of Service: 04/14/2021  REASON FOR VISIT:  Routine follow-up post-treatment for a recent history of breast cancer.  BRIEF ONCOLOGIC HISTORY:  Oncology History Overview Note  Cancer Staging Malignant neoplasm of upper-outer quadrant of left breast in female, estrogen receptor negative ([place]) Staging form: Breast, AJCC 8th Edition - Clinical stage from 10/28/2019: Stage IIIB (cT2, cN1, cM0, G3, ER-, PR-, HER2-) - Signed by Eppie Gibson, MD on 11/05/2019 - Pathologic stage from 12/26/2019: Stage IIIC (pT3, pN3a, cM0, G3, ER-, PR-, HER2-) - Signed by Truitt Merle, MD on 01/28/2020    Malignant neoplasm of upper-outer quadrant of left breast in female, estrogen receptor negative ([place])  10/27/2019 Mammogram   IMPRESSION: 1. Right breast calcifications spanning 2.6 cm in the upper slightly medial breast are indeterminate.   2. Left breast dominant mass at 3 o'clock, 6 cm from nipple measuring 3x3.9x2.9 cm is highly suspicious. There is overlying skin thickening, skin involvement is not excluded.   3. Left breast mass/distorted tissue at 1 o'clock, 4cm from nipple measuring 3.0x1.2x2.9 cm is likely in contiguity with the dominant mass at 3 o'clock and is also highly suspicious. With the dominant lesion the overall span a disease is approximately 6 cm.   4. Left breast distortion at 2 o'clock 10 cm from the nipple is Suspicious, measuring 1.0 x 0.8 cm.   5.  Abnormal left axillary lymph nodes (2). Cortical thickness measures up to 0.6 cm.    10/28/2019 Initial Biopsy    Diagnosis 1. Breast, left, needle core biopsy, 3 o'clock, 5cmfn - INVASIVE MAMMARY CARCINOMA. 2. Breast, left, needle core biopsy, 2 o'clock, 10cmfn - INVASIVE MAMMARY CARCINOMA. 3. 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 Microscopic Comment 1. -3. Overall, immunohistochemistry favors a pronounced desmoplastic response, but metaplastic carcinoma cannot be excluded (Epithelioid component: CKAE1AE3 strong +, CK5/6 weak +). The greatest linear extent of tumor in any one core in specimen 1 is 14 mm. The greatest linear extent of tumor in any one core in specimen 2 is 16 mm.   10/28/2019 Receptors her2   3. PROGNOSTIC INDICATORS Results: IMMUNOHISTOCHEMICAL AND MORPHOMETRIC ANALYSIS PERFORMED MANUALLY The tumor cells are EQUIVOCAL for Her2 (2+). Her2 by FISH will be performed and results reported separately. Estrogen Receptor: 0%, NEGATIVE Progesterone Receptor: 0%, NEGATIVE Proliferation Marker Ki67: 10%    3. FLUORESCENCE IN-SITU HYBRIDIZATION Results: GROUP 5: HER2 **NEGATIVE** Equivocal form of amplification of the HER2 gene was detected in the IHC 2+ tissue sample received from this individual. HER2 FISH was performed by a technologist and cell imaging and analysis on the [employer].   10/28/2019 Cancer Staging   Staging form: Breast, AJCC 8th Edition - Clinical stage from 10/28/2019: Stage IIIB (cT2, cN1, cM0, G3, ER-, PR-, HER2-) - Signed by Eppie Gibson, MD on 11/05/2019    10/31/2019 Initial Diagnosis   Malignant neoplasm of upper-outer quadrant of left breast in female, estrogen receptor negative ([place])   10/31/2019 Pathology Results   Diagnosis Breast, right, needle core biopsy, UOQ, posterior - FOCAL USUAL DUCTAL HYPERPLASIA AND FIBROCYSTIC CHANGES WITH CALCIFICATIONS - FIBROADENOMATOID  CHANGES - NO MALIGNANCY IDENTIFIED Microscopic Comment These results were called to [facility] on November 03, 2019.    11/05/2019 Genetic Testing   She declined Genetic testing    11/21/2019 Imaging   CT CAP w contrast  IMPRESSION: 1. Left breast mass and prominent left axillary lymph nodes, containing biopsy marking clips, consistent with newly diagnosed breast malignancy. 2. No definite evidence of distant metastatic disease in the chest, abdomen, or pelvis. 3. There are occasional small pulmonary nodules, measuring 2-3 mm, most likely incidental sequelae of prior infection or inflammation although metastatic disease is not excluded. Attention on follow-up. 4. Hepatic steatosis. 5. Aortic Atherosclerosis (ICD10-I70.0).     11/21/2019 Imaging   Bone Scan Whole Body IMPRESSION: 1. Single focus of uptake associated with a subacute fracture of LEFT anterior fourth rib. 2. No additional areas of abnormal uptake.   12/26/2019 Cancer Staging   Staging form: Breast, AJCC 8th Edition - Pathologic stage from 12/26/2019: Stage IIIC (pT3, pN3a, cM0, G3, ER-, PR-, HER2-) - Signed by Truitt Merle, MD on 01/28/2020    12/26/2019 Surgery   LEFT MASTECTOMY MODIFIED RADICAL and PAC Placement by Dr Marlou Starks   12/26/2019 Pathology Results   FINAL MICROSCOPIC DIAGNOSIS:   A. 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  - Ductal carcinoma in situ, high nuclear grade with central necrosis.  - Margins of resection:  - Metaplastic carcinoma focally involves the anterior margin and is < 1  mm from the posterior margin.  - DCIS is < 1 mm from the anterior margin.  - Metastatic carcinoma in (13) of (16) lymph nodes with extranodal  extension.  - Biopsy clip sites in breast and one lymph node.  - See oncology table.    ADDENDUM:  PROGNOSTIC INDICATOR RESULTS:  Immunohistochemical and morphometric analysis performed manually  The tumor cells are EQUIVOCAL for Her2 (2+). Her2 FISH has been ordered  and will be reported in an  addendum.  Estrogen Receptor:       NEGATIVE  Progesterone Receptor:   NEGATIVE  Proliferation Marker Ki-67:   30%    ADDENDUM:  FLOURESCENCE IN-SITU HYBRIDIZATION RESULTS:  GROUP 5:   HER2 NEGATIVE   01/28/2020 Echocardiogram   Baseline Echo  IMPRESSIONS     1. Left ventricular ejection fraction, by estimation, is 60 to 65%. The  left ventricle has normal function. The left ventricle has no regional  wall motion abnormalities. Left ventricular diastolic parameters are  consistent with Grade I diastolic  dysfunction (impaired relaxation). The average left ventricular global  longitudinal strain is -19.8 %.   2. Right ventricular systolic function is normal. The right ventricular  size is normal. Tricuspid regurgitation signal is inadequate for assessing  PA pressure.   3. The mitral valve is normal in structure. No evidence of mitral valve  regurgitation. No evidence of mitral stenosis.   4. The aortic valve is normal in structure. Aortic valve regurgitation is  not visualized. No aortic stenosis is present.   5. The inferior vena cava is normal in size with greater than 50%  respiratory variability, suggesting right atrial pressure of 3 mmHg.    02/05/2020 - 01/19/2021 Chemotherapy   Keytruda q3weeks (to be taken for 1 whole year) with weekly Carboplatin/Taxol for 12 weeks starting 02/05/20-05/13/20 followed by Adriamycin/Cytoxan q2weeks X4 starting 05/20/20-07/13/20.  ----Continue Keytruda q3weeks from 08/03/20 to complete 1 year of treatment from 02/05/20).  06/17/2020 Breast US   FINDINGS: On physical exam,well-healed scars of LEFT mastectomy and LEFT axillary node dissection. I palpate no axillary mass. Ultrasound is performed, showing normal appearing LEFT axillary contents. No mass or enlarged lymph nodes. IMPRESSION: Ultrasound is negative for LEFT axillary adenopathy.   08/24/2020 - 10/05/2020 Radiation Therapy   Adjuvant RT per Dr. Isidore Moos starting 08/24/20-10/05/20    04/14/2021 Survivorship   SCP delivered by Cira Rue, NP     INTERVAL HISTORY:  Ms. Labombard presents to the [facility] today for our initial meeting to review her survivorship care plan detailing her treatment course for breast cancer, as well as monitoring long-term side effects of that treatment, education regarding health maintenance, screening, and overall wellness and health promotion.     Overall, Ms. Kubisiak is feeling well but still recovering from chemo and cancer treatment.  Taste has not completely returned.  She still has some "brain fog."  She is working more, 4 to 6 days/week now.  She has occasional leg edema that mostly resolves overnight.  Denies concerns in her breast such as new lump/mass, chest wall mass, nipple discharge or inversion on the right, or skin change.  Denies recent bone or joint pain, abdominal pain or bloating, headache, or any other specific complaints.  ONCOLOGY TREATMENT TEAM:  1. Surgeon:  Dr. Marlou Starks at [facility] 2. Medical Oncologist: Dr. Burr Medico 3. Radiation Oncologist: Dr. Isidore Moos    PAST MEDICAL/SURGICAL HISTORY:  Past Medical History:  Diagnosis Date   Bronchitis    Family history of bone cancer    Family history of breast cancer    Family history of prostate cancer    Fibromyalgia    HCV (hepatitis C virus)    MRSA (methicillin resistant Staphylococcus aureus) 2012   Past Surgical History:  Procedure Laterality Date   MASTECTOMY MODIFIED RADICAL Left 12/26/2019   Procedure: LEFT MASTECTOMY MODIFIED RADICAL;  Surgeon: [name], MD;  Location: [place];  Service: General;  Laterality: Left;  PEC BLOCK   PORTACATH PLACEMENT Right 12/26/2019   Procedure: INSERTION PORT-A-CATH WITH ULTRASOUND GUIDANCE;  Surgeon: Autumn Messing III, MD;  Location: [place];  Service: General;  Laterality: Right;     ALLERGIES:  Allergies  Allergen Reactions   Codeine Hives and Nausea And Vomiting     CURRENT MEDICATIONS:  Outpatient  Encounter Medications as of 04/14/2021  Medication Sig   acetaminophen (TYLENOL) 650 MG CR tablet Take 650 mg by mouth every 8 (eight) hours as needed for pain.    ascorbic acid (VITAMIN C) 250 MG CHEW Chew by mouth.   dexamethasone (DECADRON) 0.5 MG/5ML solution Take 10 ml (1 mg) swish for 2 minutes and then spit.  Do this four times daily as needed Do Not eat or drink for 1 hour after.   diphenoxylate-atropine (LOMOTIL) 2.5-0.025 MG tablet Take 1-2 tablets by mouth 4 (four) times daily as needed for diarrhea or loose stools.   gabapentin (NEURONTIN) 300 MG capsule Take 300 mg by mouth 3 (three) times daily as needed.   lidocaine (XYLOCAINE) 2 % solution Use as directed 15 mLs in the mouth or throat every 4 (four) hours as needed for mouth pain.   lidocaine-prilocaine (EMLA) cream Apply 1 application topically as needed.   methocarbamol (ROBAXIN) 750 MG tablet Take 1 tablet (750 mg total) by mouth 4 (four) times daily as needed (use for muscle cramps/pain).   mucosal barrier oral (GELCLAIR) GEL Take 1 packet by mouth as needed.   omeprazole (  PRILOSEC) 20 MG capsule Take 1 capsule (20 mg total) by mouth daily.   valACYclovir (VALTREX) 1000 MG tablet Take 1 tablet (1,000 mg total) by mouth 2 (two) times daily.   [DISCONTINUED] prochlorperazine (COMPAZINE) 10 MG tablet Take 1 tablet (10 mg total) by mouth every 6 (six) hours as needed (Nausea or vomiting).   No facility-administered encounter medications on file as of 04/14/2021.     ONCOLOGIC FAMILY HISTORY:  Family History  Problem Relation Age of Onset   Breast cancer Maternal Aunt 78   Prostate cancer Maternal Uncle        dx. in his 69s   Bone cancer Maternal Grandmother        dx. 93   Cancer Paternal Grandmother        possible cancer, unknown type    Prostate cancer Cousin        maternal first cousin   Heart Problems Brother    Bipolar disorder Brother    Cancer Cousin        unknown type, paternal first cousin     GENETIC  COUNSELING/TESTING: Declined  SOCIAL HISTORY:  Kaijah Abts is single, lives in [place], [place].   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  She has strong social support network.  She smoked cigarettes in her early 54s, denies current tobacco, alcohol, or illicit drug use.     PHYSICAL EXAMINATION:  Vital Signs:   Vitals:   04/14/21 1033  BP: 131/71  Pulse: 65  Resp: 18  Temp: 97.7 F (36.5 C)  SpO2: 99%   Filed Weights   04/14/21 1033  Weight: 140 lb 4.8 oz (63.6 kg)   General: Well-nourished, well-appearing female in no acute distress.   HEENT: Sclera anicteric Lymph: No cervical, supraclavicular, or infraclavicular lymphadenopathy noted on palpation.  Cardiovascular: Regular rate and rhythm. Respiratory: Clear; breathing non-labored.  GI: Abdomen soft and round; non-tender, non-distended. Bowel sounds normoactive.  Neuro: No focal deficits. Steady gait.  Psych: Mood and affect normal and appropriate for situation.  Extremities: No edema. MSK: No focal spinal tenderness to palpation.  Full range of motion in bilateral upper extremities Skin: Warm and dry. Breast exam: S/p left mastectomy, incisions completely healed.  No palpable mass or nodularity along the left chest wall, right breast, or either axilla that I could appreciate.  LABORATORY DATA:  CBC Latest Ref Rng & Units 04/14/2021 03/02/2021 01/19/2021  WBC 4.0 - 10.5 K/uL 4.9 3.6(L) 4.4  Hemoglobin 12.0 - 15.0 g/dL 11.8(L) 11.6(L) 11.6(L)  Hematocrit 36.0 - 46.0 % 33.5(L) 35.0(L) 34.7(L)  Platelets 150 - 400 K/uL 155 128(L) 138(L)    CMP Latest Ref Rng & Units 04/14/2021 03/02/2021 01/19/2021  Glucose 70 - 99 mg/dL 102(H) 98 117(H)  BUN 8 - 23 mg/dL '16 19 16  ' Creatinine 0.44 - 1.00 mg/dL 0.65 0.69 0.70  Sodium 135 - 145 mmol/L 135 140 140  Potassium 3.5 - 5.1 mmol/L 4.1 4.2 4.0  Chloride 98 - 111 mmol/L 105 108 108  CO2 22 - 32 mmol/L '24 25 26  ' Calcium 8.9 - 10.3 mg/dL 8.3(L) 8.6(L) 8.7(L)   Total Protein 6.5 - 8.1 g/dL 5.6(L) 5.4(L) 5.4(L)  Total Bilirubin 0.3 - 1.2 mg/dL 0.3 0.3 0.4  Alkaline Phos 38 - 126 U/L 80 71 79  AST 15 - 41 U/L 52(H) 33 45(H)  ALT 0 - 44 U/L '24 16 22   ' DIAGNOSTIC IMAGING:  None for this visit.      ASSESSMENT  AND PLAN:  Ms.. Tonkovich is a pleasant 65 y.o. female with Stage 3C metaplastic left breast cancer, triple negative, diagnosed 10/2019. Treated with left mastectomy, adjuvant chemotherapy with Taxol/carbo and Adriamycin/Cytoxan +1-year of immunotherapy, and adjuvant radiation therapy. She presents to the Survivorship Clinic for our initial meeting and routine follow-up post-completion of treatment for breast cancer.    1. Stage IIIC metaplastic left breast cancer:  Ms. Strehle is continuing to recover from definitive treatment for breast cancer. She will follow-up with her medical oncologist, Dr. Burr Medico in 05/2021 with history and physical exam per surveillance protocol.  Due to her triple negative disease, she would not benefit from antiestrogen.  Breast exam is benign, no clinical concern for recurrence.  We reviewed signs and symptoms of recurrence and red flags.  She is overdue for screening right mammogram, order placed today.  Today, a comprehensive survivorship care plan and treatment summary was reviewed with the patient today detailing her breast cancer diagnosis, treatment course, potential late/long-term effects of treatment, appropriate follow-up care with recommendations for the future, and patient education resources.  A copy of this summary, along with a letter will be sent to the patient's primary care provider via In Basket message after today's visit.    2. Bone health: She can get baseline and ongoing routine DEXA per PCP. In the meantime, she was encouraged to begin daily calcium/vitamin D supplement and increase her weight-bearing activities.  She was given education on specific activities to promote bone health.  3. Cancer screening:  Due  to Ms. Geving's history and her age, she should receive screening for skin cancers, colon cancer, and gynecologic cancers.  Is has never had colonoscopy, I referred her to GI today. The information and recommendations are listed on the patient's comprehensive care plan/treatment summary and were reviewed in detail with the patient.    4. Health maintenance and wellness promotion: Ms. Doerner was encouraged to consume 5-7 servings of fruits and vegetables per day. We reviewed the "Nutrition Rainbow" handout.  She was also encouraged to engage in moderate to vigorous exercise for 30 minutes per day most days of the week. She was instructed to limit her alcohol consumption and continue to abstain from tobacco use.   5. Support services/counseling: It is not uncommon for this period of the patient's cancer care trajectory to be one of many emotions and stressors.  We discussed an opportunity for her to participate in the next session of [facility] ("Finding Your New Normal") support group series designed for patients after they have completed treatment.   Ms. Souffrant was encouraged to take advantage of our many other support services programs, support groups, and/or counseling in coping with her new life as a cancer survivor after completing anti-cancer treatment.  She was offered support today through active listening and expressive supportive counseling.  She was given information regarding our available services and encouraged to contact me with any questions or for help enrolling in any of our support group/programs.    Dispo:   -Return to cancer center 06/01/21 as scheduled (with port flush)  -overdue R mammogram, ordered  -Follow up with surgery as indicated -Referred to GI for first screening colonoscopy  -She is welcome to return back to the Survivorship Clinic at any time; no additional follow-up needed at this time.  -Consider referral back to survivorship as a long-term survivor for continued  surveillance  A total of (40) minutes of face-to-face time was spent with this patient with greater than 50% of  that time in counseling and care-coordination.   Cira Rue, NP Survivorship Program [facility] [phone]   Note: PRIMARY CARE PROVIDER Scotty Court, [place] [phone]

## 2021-04-16 ENCOUNTER — Encounter: Payer: Self-pay | Admitting: Nurse Practitioner

## 2021-06-01 ENCOUNTER — Inpatient Hospital Stay: Payer: Medicare Other | Attending: Nurse Practitioner

## 2021-06-01 ENCOUNTER — Inpatient Hospital Stay (HOSPITAL_BASED_OUTPATIENT_CLINIC_OR_DEPARTMENT_OTHER): Payer: Medicare Other | Admitting: Hematology

## 2021-06-01 ENCOUNTER — Encounter: Payer: Self-pay | Admitting: Hematology

## 2021-06-01 ENCOUNTER — Other Ambulatory Visit: Payer: Self-pay

## 2021-06-01 ENCOUNTER — Inpatient Hospital Stay: Payer: Medicare Other

## 2021-06-01 VITALS — BP 117/73 | HR 64 | Temp 97.9°F | Resp 16 | Ht 61.0 in | Wt 141.6 lb

## 2021-06-01 DIAGNOSIS — Z171 Estrogen receptor negative status [ER-]: Secondary | ICD-10-CM

## 2021-06-01 DIAGNOSIS — Z23 Encounter for immunization: Secondary | ICD-10-CM | POA: Diagnosis not present

## 2021-06-01 DIAGNOSIS — C50412 Malignant neoplasm of upper-outer quadrant of left female breast: Secondary | ICD-10-CM

## 2021-06-01 DIAGNOSIS — Z9221 Personal history of antineoplastic chemotherapy: Secondary | ICD-10-CM | POA: Diagnosis not present

## 2021-06-01 DIAGNOSIS — Z95828 Presence of other vascular implants and grafts: Secondary | ICD-10-CM

## 2021-06-01 DIAGNOSIS — Z923 Personal history of irradiation: Secondary | ICD-10-CM | POA: Diagnosis not present

## 2021-06-01 DIAGNOSIS — Z1231 Encounter for screening mammogram for malignant neoplasm of breast: Secondary | ICD-10-CM | POA: Diagnosis not present

## 2021-06-01 DIAGNOSIS — Z9012 Acquired absence of left breast and nipple: Secondary | ICD-10-CM | POA: Insufficient documentation

## 2021-06-01 LAB — CBC WITH DIFFERENTIAL (CANCER CENTER ONLY)
Abs Immature Granulocytes: 0.01 10*3/uL (ref 0.00–0.07)
Basophils Absolute: 0 10*3/uL (ref 0.0–0.1)
Basophils Relative: 1 %
Eosinophils Absolute: 0.2 10*3/uL (ref 0.0–0.5)
Eosinophils Relative: 4 %
HCT: 35.4 % — ABNORMAL LOW (ref 36.0–46.0)
Hemoglobin: 12.4 g/dL (ref 12.0–15.0)
Immature Granulocytes: 0 %
Lymphocytes Relative: 29 %
Lymphs Abs: 1.2 10*3/uL (ref 0.7–4.0)
MCH: 32.3 pg (ref 26.0–34.0)
MCHC: 35 g/dL (ref 30.0–36.0)
MCV: 92.2 fL (ref 80.0–100.0)
Monocytes Absolute: 0.5 10*3/uL (ref 0.1–1.0)
Monocytes Relative: 11 %
Neutro Abs: 2.4 10*3/uL (ref 1.7–7.7)
Neutrophils Relative %: 55 %
Platelet Count: 152 10*3/uL (ref 150–400)
RBC: 3.84 MIL/uL — ABNORMAL LOW (ref 3.87–5.11)
RDW: 12.8 % (ref 11.5–15.5)
WBC Count: 4.3 10*3/uL (ref 4.0–10.5)
nRBC: 0 % (ref 0.0–0.2)

## 2021-06-01 LAB — CMP (CANCER CENTER ONLY)
ALT: 18 U/L (ref 0–44)
AST: 35 U/L (ref 15–41)
Albumin: 3.7 g/dL (ref 3.5–5.0)
Alkaline Phosphatase: 69 U/L (ref 38–126)
Anion gap: 4 — ABNORMAL LOW (ref 5–15)
BUN: 19 mg/dL (ref 8–23)
CO2: 28 mmol/L (ref 22–32)
Calcium: 8.7 mg/dL — ABNORMAL LOW (ref 8.9–10.3)
Chloride: 105 mmol/L (ref 98–111)
Creatinine: 0.59 mg/dL (ref 0.44–1.00)
GFR, Estimated: >60 mL/min (ref >60)
Glucose, Bld: 94 mg/dL (ref 70–99)
Potassium: 3.9 mmol/L (ref 3.5–5.1)
Sodium: 137 mmol/L (ref 135–145)
Total Bilirubin: 0.4 mg/dL (ref 0.3–1.2)
Total Protein: 5.7 g/dL — ABNORMAL LOW (ref 6.5–8.1)

## 2021-06-01 MED ORDER — SODIUM CHLORIDE 0.9% FLUSH
10.0000 mL | Freq: Once | INTRAVENOUS | Status: AC
  Administered 2021-06-01: 11:00:00 10 mL

## 2021-06-01 MED ORDER — INFLUENZA VAC A&B SA ADJ QUAD 0.5 ML IM PRSY
0.5000 mL | PREFILLED_SYRINGE | Freq: Once | INTRAMUSCULAR | Status: AC
  Administered 2021-06-01: 12:00:00 0.5 mL via INTRAMUSCULAR
  Filled 2021-06-01: qty 0.5

## 2021-06-01 MED ORDER — INFLUENZA VAC A&B SA ADJ QUAD 0.5 ML IM PRSY: 0.5000 mL | PREFILLED_SYRINGE | Freq: Once | INTRAMUSCULAR | Status: DC

## 2021-06-01 MED ORDER — HEPARIN SOD (PORK) LOCK FLUSH 100 UNIT/ML IV SOLN
500.0000 [IU] | Freq: Once | INTRAVENOUS | Status: AC
  Administered 2021-06-01: 11:00:00 500 [IU]

## 2021-06-01 NOTE — Progress Notes (Signed)
[place]   [phone] [phone]   Clinic Follow up Note   Patient Care Team: Scotty Court, DO as PCP - General Mauro Kaufmann, RN as Oncology Nurse Navigator Rockwell Germany, RN as Oncology Nurse Navigator [name], MD as Consulting Physician (General Surgery) Truitt Merle, MD as Consulting Physician (Hematology) Eppie Gibson, MD as Attending Physician (Radiation Oncology) Alla Feeling, NP as Nurse Practitioner (Nurse Practitioner)  Date of Service:  06/01/2021  CHIEF COMPLAINT: f/u of left breast metaplastic cancer  CURRENT THERAPY:  Surveillance  ASSESSMENT & PLAN:  Lindsay [name] is a 66 y.o. female with   1. 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 Her 11/2019 CT CAP and bone scan was negative for distant metastasis.  -S/p left mastectomy with Dr Marlou Starks on 12/26/19. Surgical path showed 6cm multifocal metaplastic carcinoma and 13/16 positive LN but unfortunately 1 positive LN was not able to be removed due to vascular invasion.  -She was treated with adjuvant chemo carbo/taxol from 02/05/20 - 05/13/20 followed by Adriamycin/cytoxan 05/20/20 - 07/13/20 along with Ballard Russell through 01/19/21.  She tolerated AC poorly with mucositis, weight loss, constipation, and weakness. She is recovering well -I do not recommend adjuvant Xeloda, I think the benefit is small due to her histology  -interim left axillary [place] 06/17/20 is negative for adenopathy, c/w response to adjuvant chemo -She completed adjuvant radiation with Dr. Isidore Moos 08/24/20 - 10/05/20, she has recovered well  -restaging CT CAP 12/29/20 shows no evidence of residual disease or adenopathy, or metastatic disease.  -she is now under surveillance since  completing [place]. -she is due for mammogram, which has been delayed due to a death in her family. I ordered right MM and left [place] for her. -from a breast cancer standpoint, she is doing well. Labs reviewed, overall stable. Physical exam unremarkable -she will keep her port for now. She will continue to return for flush every 6 weeks.   3. Hepatitis C, untreated, diagnosed in 1992 -LFTs overall sable    4. Social and financial support  -She has no children and no close relatives. She does have close friends and church community who are supportive and strong faith.  -She did not have medical insurance for many years. She is currently on Medicare and BCBS.   5. Mild anemia -Secondary to chemotherapy, mild, will continue monitoring. -Hgb WNL today at 12.4 (06/01/21)   6. Covid-19+ 02/02/21 -she experienced body aches, fatigue, congestion, cough, and sore throat. -she was treated with azithromysin and paxlovid.     PLAN: -port flush in 6 weeks -lab, flush, and f/u in 3 months -mammogram and left axillary [place] within next month    No problem-specific Assessment & Plan notes found for this encounter.   SUMMARY OF ONCOLOGIC HISTORY: Oncology History Overview Note  Cancer Staging Malignant neoplasm of upper-outer quadrant of left breast in female, estrogen receptor negative ([place]) Staging form: Breast, AJCC 8th Edition - Clinical stage from 10/28/2019: Stage IIIB (cT2, cN1, cM0, G3, ER-, PR-, HER2-) - Signed by Eppie Gibson, MD on 11/05/2019 - Pathologic stage from 12/26/2019: Stage IIIC (pT3, pN3a, cM0, G3, ER-, PR-, HER2-) - Signed by Truitt Merle, MD on 01/28/2020    Malignant neoplasm of upper-outer quadrant of left breast in female, estrogen receptor negative ([place])  10/27/2019 Mammogram  IMPRESSION: 1. Right breast calcifications spanning 2.6 cm in the upper slightly medial breast are indeterminate.   2. Left breast dominant mass at 3 o'clock, 6 cm from nipple measuring 3x3.9x2.9 cm  is highly suspicious. There is overlying skin thickening, skin involvement is not excluded.   3. Left breast mass/distorted tissue at 1 o'clock, 4cm from nipple measuring 3.0x1.2x2.9 cm is likely in contiguity with the dominant mass at 3 o'clock and is also highly suspicious. With the dominant lesion the overall span a disease is approximately 6 cm.   4. Left breast distortion at 2 o'clock 10 cm from the nipple is Suspicious, measuring 1.0 x 0.8 cm.   5.  Abnormal left axillary lymph nodes (2). Cortical thickness measures up to 0.6 cm.    10/28/2019 Initial Biopsy   Diagnosis 1. Breast, left, needle core biopsy, 3 o'clock, 5cmfn - INVASIVE MAMMARY CARCINOMA. 2. Breast, left, needle core biopsy, 2 o'clock, 10cmfn - INVASIVE MAMMARY CARCINOMA. 3. 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 Microscopic Comment 1. -3. Overall, immunohistochemistry favors a pronounced desmoplastic response, but metaplastic carcinoma cannot be excluded (Epithelioid component: CKAE1AE3 strong +, CK5/6 weak +). The greatest linear extent of tumor in any one core in specimen 1 is 14 mm. The greatest linear extent of tumor in any one core in specimen 2 is 16 mm.   10/28/2019 Receptors her2   3. PROGNOSTIC INDICATORS Results: IMMUNOHISTOCHEMICAL AND MORPHOMETRIC ANALYSIS PERFORMED MANUALLY The tumor cells are EQUIVOCAL for Her2 (2+). Her2 by FISH will be performed and results reported separately. Estrogen Receptor: 0%, NEGATIVE Progesterone Receptor: 0%, NEGATIVE Proliferation Marker Ki67: 10%    3. FLUORESCENCE IN-SITU HYBRIDIZATION Results: GROUP 5: HER2 **NEGATIVE** Equivocal form of amplification of the HER2 gene was detected in the IHC 2+ tissue sample received from this individual. HER2 FISH was performed by a technologist and cell imaging and analysis on the [employer].   10/28/2019 Cancer Staging   Staging form: Breast, AJCC 8th Edition - Clinical  stage from 10/28/2019: Stage IIIB (cT2, cN1, cM0, G3, ER-, PR-, HER2-) - Signed by Eppie Gibson, MD on 11/05/2019    10/31/2019 Initial Diagnosis   Malignant neoplasm of upper-outer quadrant of left breast in female, estrogen receptor negative ([place])   10/31/2019 Pathology Results   Diagnosis Breast, right, needle core biopsy, UOQ, posterior - FOCAL USUAL DUCTAL HYPERPLASIA AND FIBROCYSTIC CHANGES WITH CALCIFICATIONS - FIBROADENOMATOID CHANGES - NO MALIGNANCY IDENTIFIED Microscopic Comment These results were called to [facility] on November 03, 2019.   11/05/2019 Genetic Testing   She declined Genetic testing    11/21/2019 Imaging   CT CAP w contrast  IMPRESSION: 1. Left breast mass and prominent left axillary lymph nodes, containing biopsy marking clips, consistent with newly diagnosed breast malignancy. 2. No definite evidence of distant metastatic disease in the chest, abdomen, or pelvis. 3. There are occasional small pulmonary nodules, measuring 2-3 mm, most likely incidental sequelae of prior infection or inflammation although metastatic disease is not excluded. Attention on follow-up. 4. Hepatic steatosis. 5. Aortic Atherosclerosis (ICD10-I70.0).     11/21/2019 Imaging   Bone Scan Whole Body IMPRESSION: 1. Single focus of uptake associated with a subacute fracture of LEFT anterior fourth rib. 2. No additional areas of abnormal uptake.   12/26/2019 Cancer Staging   Staging form: Breast, AJCC 8th Edition - Pathologic stage from 12/26/2019: Stage IIIC (pT3, pN3a, cM0, G3, ER-, PR-, HER2-) - Signed by Truitt Merle, MD on  01/28/2020    12/26/2019 Surgery   LEFT MASTECTOMY MODIFIED RADICAL and PAC Placement by Dr Marlou Starks   12/26/2019 Pathology Results   FINAL MICROSCOPIC DIAGNOSIS:   A. 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  - Ductal carcinoma in situ, high nuclear  grade with central necrosis.  - Margins of resection:  - Metaplastic carcinoma focally involves the anterior margin and is < 1  mm from the posterior margin.  - DCIS is < 1 mm from the anterior margin.  - Metastatic carcinoma in (13) of (16) lymph nodes with extranodal  extension.  - Biopsy clip sites in breast and one lymph node.  - See oncology table.    ADDENDUM:  PROGNOSTIC INDICATOR RESULTS:  Immunohistochemical and morphometric analysis performed manually  The tumor cells are EQUIVOCAL for Her2 (2+). Her2 FISH has been ordered  and will be reported in an addendum.  Estrogen Receptor:       NEGATIVE  Progesterone Receptor:   NEGATIVE  Proliferation Marker Ki-67:   30%    ADDENDUM:  FLOURESCENCE IN-SITU HYBRIDIZATION RESULTS:  GROUP 5:   HER2 NEGATIVE   01/28/2020 Echocardiogram   Baseline Echo  IMPRESSIONS     1. Left ventricular ejection fraction, by estimation, is 60 to 65%. The  left ventricle has normal function. The left ventricle has no regional  wall motion abnormalities. Left ventricular diastolic parameters are  consistent with Grade I diastolic  dysfunction (impaired relaxation). The average left ventricular global  longitudinal strain is -19.8 %.   2. Right ventricular systolic function is normal. The right ventricular  size is normal. Tricuspid regurgitation signal is inadequate for assessing  PA pressure.   3. The mitral valve is normal in structure. No evidence of mitral valve  regurgitation. No evidence of mitral stenosis.   4. The aortic valve is normal in structure. Aortic valve regurgitation is  not visualized. No aortic stenosis is present.   5. The inferior vena cava is normal in size with greater than 50%  respiratory variability, suggesting right atrial pressure of 3 mmHg.    02/05/2020 - 01/19/2021 Chemotherapy   Keytruda q3weeks (to be taken for 1 whole year) with weekly Carboplatin/Taxol for 12 weeks starting 02/05/20-05/13/20 followed by  Adriamycin/Cytoxan q2weeks X4 starting 05/20/20-07/13/20.  ----Continue Keytruda q3weeks from 08/03/20 to complete 1 year of treatment from 02/05/20).    06/17/2020 Breast US   FINDINGS: On physical exam,well-healed scars of LEFT mastectomy and LEFT axillary node dissection. I palpate no axillary mass. Ultrasound is performed, showing normal appearing LEFT axillary contents. No mass or enlarged lymph nodes. IMPRESSION: Ultrasound is negative for LEFT axillary adenopathy.   08/24/2020 - 10/05/2020 Radiation Therapy   Adjuvant RT per Dr. Isidore Moos starting 08/24/20-10/05/20   04/14/2021 Survivorship   SCP delivered by Cira Rue, NP      INTERVAL HISTORY:  Lindsay [name] is here for a follow up of breast cancer. She was last seen by me on 03/02/21 with survivorship visit in the interim. She presents to the clinic alone.   All other systems were reviewed with the patient and are negative.  MEDICAL HISTORY:  Past Medical History:  Diagnosis Date   Bronchitis    Family history of bone cancer    Family history of breast cancer    Family history of prostate cancer    Fibromyalgia    HCV (hepatitis C virus)    MRSA (methicillin  resistant Staphylococcus aureus) 2012    SURGICAL HISTORY: Past Surgical History:  Procedure Laterality Date   MASTECTOMY MODIFIED RADICAL Left 12/26/2019   Procedure: LEFT MASTECTOMY MODIFIED RADICAL;  Surgeon: [name], MD;  Location: [facility];  Service: General;  Laterality: Left;  PEC BLOCK   PORTACATH PLACEMENT Right 12/26/2019   Procedure: INSERTION PORT-A-CATH WITH ULTRASOUND GUIDANCE;  Surgeon: [name], MD;  Location: [facility];  Service: General;  Laterality: Right;    I have reviewed the social history and family history with the patient and they are unchanged from previous note.  ALLERGIES:  is allergic to codeine.  MEDICATIONS:  Current Outpatient Medications  Medication Sig Dispense Refill   acetaminophen (TYLENOL) 650 MG CR tablet Take  650 mg by mouth every 8 (eight) hours as needed for pain.      ascorbic acid (VITAMIN C) 250 MG CHEW Chew by mouth.     dexamethasone (DECADRON) 0.5 MG/5ML solution Take 10 ml (1 mg) swish for 2 minutes and then spit.  Do this four times daily as needed Do Not eat or drink for 1 hour after. 240 mL 0   diphenoxylate-atropine (LOMOTIL) 2.5-0.025 MG tablet Take 1-2 tablets by mouth 4 (four) times daily as needed for diarrhea or loose stools. 30 tablet 1   gabapentin (NEURONTIN) 300 MG capsule Take 300 mg by mouth 3 (three) times daily as needed.     lidocaine (XYLOCAINE) 2 % solution Use as directed 15 mLs in the mouth or throat every 4 (four) hours as needed for mouth pain. 100 mL 2   lidocaine-prilocaine (EMLA) cream Apply 1 application topically as needed. 30 g 2   methocarbamol (ROBAXIN) 750 MG tablet Take 1 tablet (750 mg total) by mouth 4 (four) times daily as needed (use for muscle cramps/pain). 30 tablet 2   mucosal barrier oral (GELCLAIR) GEL Take 1 packet by mouth as needed.     omeprazole (PRILOSEC) 20 MG capsule Take 1 capsule (20 mg total) by mouth daily. 30 capsule 2   valACYclovir (VALTREX) 1000 MG tablet Take 1 tablet (1,000 mg total) by mouth 2 (two) times daily. 14 tablet 1   No current facility-administered medications for this visit.    PHYSICAL EXAMINATION: ECOG PERFORMANCE STATUS: 1 - Symptomatic but completely ambulatory  Vitals:   06/01/21 1100  BP: 117/73  Pulse: 64  Resp: 16  Temp: 97.9 F (36.6 C)  SpO2: 97%   Wt Readings from Last 3 Encounters:  06/01/21 141 lb 9.6 oz (64.2 kg)  04/14/21 140 lb 4.8 oz (63.6 kg)  03/02/21 137 lb 6.4 oz (62.3 kg)     GENERAL:alert, no distress and comfortable SKIN: skin color, texture, turgor are normal, no rashes or significant lesions EYES: normal, Conjunctiva are pink and non-injected, sclera clear  NECK: supple, thyroid normal size, non-tender, without nodularity LYMPH:  no palpable lymphadenopathy in the cervical,  axillary  LUNGS: clear to auscultation and percussion with normal breathing effort HEART: regular rate & rhythm and no murmurs and no lower extremity edema ABDOMEN:abdomen soft, non-tender and normal bowel sounds Musculoskeletal:no cyanosis of digits and no clubbing  NEURO: alert & oriented x 3 with fluent speech, no focal motor/sensory deficits BREAST: No palpable mass, nodules or adenopathy bilaterally. Breast exam benign.   LABORATORY DATA:  I have reviewed the data as listed CBC Latest Ref Rng & Units 06/01/2021 04/14/2021 03/02/2021  WBC 4.0 - 10.5 K/uL 4.3 4.9 3.6(L)  Hemoglobin 12.0 - 15.0 g/dL 12.4 11.8(L)  11.6(L)  Hematocrit 36.0 - 46.0 % 35.4(L) 33.5(L) 35.0(L)  Platelets 150 - 400 K/uL 152 155 128(L)     CMP Latest Ref Rng & Units 06/01/2021 04/14/2021 03/02/2021  Glucose 70 - 99 mg/dL 94 102(H) 98  BUN 8 - 23 mg/dL _0 Creatinine 0.44 - 1.00 mg/dL 0.59 0.65 0.69  Sodium 135 - 145 mmol/L 137 135 140  Potassium 3.5 - 5.1 mmol/L 3.9 4.1 4.2  Chloride 98 - 111 mmol/L 105 105 108  CO2 22 - 32 mmol/L _1 Calcium 8.9 - 10.3 mg/dL 8.7(L) 8.3(L) 8.6(L)  Total Protein 6.5 - 8.1 g/dL 5.7(L) 5.6(L) 5.4(L)  Total Bilirubin 0.3 - 1.2 mg/dL 0.4 0.3 0.3  Alkaline Phos 38 - 126 U/L 69 80 71  AST 15 - 41 U/L 35 52(H) 33  ALT 0 - 44 U/L _2 RADIOGRAPHIC STUDIES: I have personally reviewed the radiological images as listed and agreed with the findings in the report. No results found.    Orders Placed This Encounter  Procedures   MM Digital Screening Unilat R    Standing Status:   Future    Standing Expiration Date:   06/01/2022    Order Specific Question:   Reason for Exam (SYMPTOM  OR DIAGNOSIS REQUIRED)    Answer:   screening    Order Specific Question:   Preferred imaging location?    Answer:   [facility]   US BREAST LTD UNI LEFT INC AXILLA    Standing Status:   Future    Standing Expiration Date:   06/01/2022    Order Specific Question:   Reason  for Exam (SYMPTOM  OR DIAGNOSIS REQUIRED)    Answer:   monitor left axillary nodes    Order Specific Question:   Preferred imaging location?    Answer:   [facility]   All questions were answered. The patient knows to call the clinic with any problems, questions or concerns. No barriers to learning was detected. The total time spent in the appointment was 30 minutes.     Truitt Merle, MD 06/01/2021   I, Wilburn Mylar, am acting as scribe for Truitt Merle, MD.   I have reviewed the above documentation for accuracy and completeness, and I agree with the above.

## 2021-06-02 ENCOUNTER — Telehealth: Payer: Self-pay | Admitting: Hematology

## 2021-06-02 NOTE — Telephone Encounter (Signed)
Scheduled per 1/25 los, message left with pt

## 2021-07-06 ENCOUNTER — Telehealth: Payer: Self-pay

## 2021-07-06 NOTE — Telephone Encounter (Signed)
Pt called yesterday stating that she has a bump the size of a pimple that has formed on the left side of her neck.  Pt stated that it originally started on 06/21/2021.  Pt stated it's tender to touch, red, and hard.  Pt wants to know if she could be seen earlier than June 2023.  Pt is scheduled for port flush on 07/14/2021.  Informed pt that this RN will check with Cira Rue, NP to see if she could be seen early. ?

## 2021-07-08 ENCOUNTER — Telehealth: Payer: Self-pay

## 2021-07-08 NOTE — Telephone Encounter (Signed)
LVM informing pt that Cira Rue, NP can see her on 07/12/2021 at 12pm regarding her previous request to be seen by a provider regarding a new knot on her neck.  Awaiting pt's response. ?

## 2021-07-11 NOTE — Progress Notes (Signed)
[name]   Telephone:(336) [phone] Fax:(336) [phone]   Clinic Follow up Note   Patient Care Team: Lindsay Court, DO as PCP - General Lindsay Kaufmann, RN as Oncology Nurse Navigator Lindsay Germany, RN as Oncology Nurse Navigator Lindsay Kussmaul, MD as Consulting Physician (General Surgery) Lindsay Merle, MD as Consulting Physician (Hematology) Lindsay Gibson, MD as Attending Physician (Radiation Oncology) Lindsay Feeling, NP as Nurse Practitioner (Nurse Practitioner) 07/12/2021  CHIEF COMPLAINT: New left neck lump  SUMMARY OF ONCOLOGIC HISTORY: Oncology History Overview Note  Cancer Staging Malignant neoplasm of upper-outer quadrant of left breast in female, estrogen receptor negative ([place]) Staging form: Breast, AJCC 8th Edition - Clinical stage from 10/28/2019: Stage IIIB (cT2, cN1, cM0, G3, ER-, PR-, HER2-) - Signed by Lindsay Gibson, MD on 11/05/2019 - Pathologic stage from 12/26/2019: Stage IIIC (pT3, pN3a, cM0, G3, ER-, PR-, HER2-) - Signed by Lindsay Merle, MD on 01/28/2020    Malignant neoplasm of upper-outer quadrant of left breast in female, estrogen receptor negative ([place])  10/27/2019 Mammogram   IMPRESSION: 1. Right breast calcifications spanning 2.6 cm in the upper slightly medial breast are indeterminate.   2. Left breast dominant mass at 3 o'clock, 6 cm from nipple measuring 3x3.9x2.9 cm is highly suspicious. There is overlying skin thickening, skin involvement is not excluded.   3. Left breast mass/distorted tissue at 1 o'clock, 4cm from nipple measuring 3.0x1.2x2.9 cm is likely in contiguity with the dominant mass at 3 o'clock and is also highly suspicious. With the dominant lesion the overall span a disease is approximately 6 cm.   4. Left breast distortion at 2 o'clock 10 cm from the nipple is Suspicious, measuring 1.0 x 0.8 cm.   5.  Abnormal left axillary lymph nodes (2). Cortical thickness measures up to 0.6 cm.    10/28/2019 Initial Biopsy    Diagnosis 1. Breast, left, needle core biopsy, 3 o'clock, 5cmfn - INVASIVE MAMMARY CARCINOMA. 2. Breast, left, needle core biopsy, 2 o'clock, 10cmfn - INVASIVE MAMMARY CARCINOMA. 3. 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 Microscopic Comment 1. -3. Overall, immunohistochemistry favors a pronounced desmoplastic response, but metaplastic carcinoma cannot be excluded (Epithelioid component: CKAE1AE3 strong +, CK5/6 weak +). The greatest linear extent of tumor in any one core in specimen 1 is 14 mm. The greatest linear extent of tumor in any one core in specimen 2 is 16 mm.   10/28/2019 Receptors her2   3. PROGNOSTIC INDICATORS Results: IMMUNOHISTOCHEMICAL AND MORPHOMETRIC ANALYSIS PERFORMED MANUALLY The tumor cells are EQUIVOCAL for Her2 (2+). Her2 by FISH will be performed and results reported separately. Estrogen Receptor: 0%, NEGATIVE Progesterone Receptor: 0%, NEGATIVE Proliferation Marker Ki67: 10%    3. FLUORESCENCE IN-SITU HYBRIDIZATION Results: GROUP 5: HER2 **NEGATIVE** Equivocal form of amplification of the HER2 gene was detected in the IHC 2+ tissue sample received from this individual. HER2 FISH was performed by a technologist and cell imaging and analysis on the [employer].   10/28/2019 Cancer Staging   Staging form: Breast, AJCC 8th Edition - Clinical stage from 10/28/2019: Stage IIIB (cT2, cN1, cM0, G3, ER-, PR-, HER2-) - Signed by Lindsay Gibson, MD on 11/05/2019    10/31/2019 Initial Diagnosis   Malignant neoplasm of upper-outer quadrant of left breast in female, estrogen receptor negative ([place])   10/31/2019 Pathology Results   Diagnosis Breast, right, needle core biopsy, UOQ, posterior - FOCAL USUAL DUCTAL HYPERPLASIA AND FIBROCYSTIC CHANGES WITH CALCIFICATIONS - FIBROADENOMATOID  CHANGES - NO MALIGNANCY IDENTIFIED Microscopic Comment These results were called to [facility] on November 03, 2019.    11/05/2019 Genetic Testing   She declined Genetic testing    11/21/2019 Imaging   CT CAP w contrast  IMPRESSION: 1. Left breast mass and prominent left axillary lymph nodes, containing biopsy marking clips, consistent with newly diagnosed breast malignancy. 2. No definite evidence of distant metastatic disease in the chest, abdomen, or pelvis. 3. There are occasional small pulmonary nodules, measuring 2-3 mm, most likely incidental sequelae of prior infection or inflammation although metastatic disease is not excluded. Attention on follow-up. 4. Hepatic steatosis. 5. Aortic Atherosclerosis (ICD10-I70.0).     11/21/2019 Imaging   Bone Scan Whole Body IMPRESSION: 1. Single focus of uptake associated with a subacute fracture of LEFT anterior fourth rib. 2. No additional areas of abnormal uptake.   12/26/2019 Cancer Staging   Staging form: Breast, AJCC 8th Edition - Pathologic stage from 12/26/2019: Stage IIIC (pT3, pN3a, cM0, G3, ER-, PR-, HER2-) - Signed by Lindsay Merle, MD on 01/28/2020    12/26/2019 Surgery   LEFT MASTECTOMY MODIFIED RADICAL and PAC Placement by Dr Marlou Starks   12/26/2019 Pathology Results   FINAL MICROSCOPIC DIAGNOSIS:   A. 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  - Ductal carcinoma in situ, high nuclear grade with central necrosis.  - Margins of resection:  - Metaplastic carcinoma focally involves the anterior margin and is < 1  mm from the posterior margin.  - DCIS is < 1 mm from the anterior margin.  - Metastatic carcinoma in (13) of (16) lymph nodes with extranodal  extension.  - Biopsy clip sites in breast and one lymph node.  - See oncology table.    ADDENDUM:  PROGNOSTIC INDICATOR RESULTS:  Immunohistochemical and morphometric analysis performed manually  The tumor cells are EQUIVOCAL for Her2 (2+). Her2 FISH has been ordered  and will be reported in an  addendum.  Estrogen Receptor:       NEGATIVE  Progesterone Receptor:   NEGATIVE  Proliferation Marker Ki-67:   30%    ADDENDUM:  FLOURESCENCE IN-SITU HYBRIDIZATION RESULTS:  GROUP 5:   HER2 NEGATIVE   01/28/2020 Echocardiogram   Baseline Echo  IMPRESSIONS     1. Left ventricular ejection fraction, by estimation, is 60 to 65%. The  left ventricle has normal function. The left ventricle has no regional  wall motion abnormalities. Left ventricular diastolic parameters are  consistent with Grade I diastolic  dysfunction (impaired relaxation). The average left ventricular global  longitudinal strain is -19.8 %.   2. Right ventricular systolic function is normal. The right ventricular  size is normal. Tricuspid regurgitation signal is inadequate for assessing  PA pressure.   3. The mitral valve is normal in structure. No evidence of mitral valve  regurgitation. No evidence of mitral stenosis.   4. The aortic valve is normal in structure. Aortic valve regurgitation is  not visualized. No aortic stenosis is present.   5. The inferior vena cava is normal in size with greater than 50%  respiratory variability, suggesting right atrial pressure of 3 mmHg.    02/05/2020 - 01/19/2021 Chemotherapy   Keytruda q3weeks (to be taken for 1 whole year) with weekly Carboplatin/Taxol for 12 weeks starting 02/05/20-05/13/20 followed by Adriamycin/Cytoxan q2weeks X4 starting 05/20/20-07/13/20.  ----Continue Keytruda q3weeks from 08/03/20 to complete 1 year of treatment from 02/05/20).  06/17/2020 Breast US   FINDINGS: On physical exam,well-healed scars of LEFT mastectomy and LEFT axillary node dissection. I palpate no axillary mass. Ultrasound is performed, showing normal appearing LEFT axillary contents. No mass or enlarged lymph nodes. IMPRESSION: Ultrasound is negative for LEFT axillary adenopathy.   08/24/2020 - 10/05/2020 Radiation Therapy   Adjuvant RT per Dr. Isidore Moos starting 08/24/20-10/05/20    04/14/2021 Survivorship   SCP delivered by Cira Rue, NP     CURRENT THERAPY: Breast cancer surveillance  INTERVAL HISTORY: Ms. Mishkin presents for symptom management visit.  She first noticed a new lump the size of a pimple that formed on the left side of her neck on 06/21/2021.  She sent [place] a message on 07/06/2021 reporting that it is tender, red, and hard and requested an earlier appointment. She has not tried warm compress or medication. She denies insect bite, environmental exposure, other rash, fever, chills, adenopathy, or other change. She still has fatigue and CIPN in her feet, but is able to work some. Denies new rash/nodularity on the left chest wall. She is overdue for R mammo, plans to schedule that and colonoscopy soon.    MEDICAL HISTORY:  Past Medical History:  Diagnosis Date   Bronchitis    Family history of bone cancer    Family history of breast cancer    Family history of prostate cancer    Fibromyalgia    HCV (hepatitis C virus)    MRSA (methicillin resistant Staphylococcus aureus) 2012    SURGICAL HISTORY: Past Surgical History:  Procedure Laterality Date   MASTECTOMY MODIFIED RADICAL Left 12/26/2019   Procedure: LEFT MASTECTOMY MODIFIED RADICAL;  Surgeon: Lindsay Kussmaul, MD;  Location: [facility];  Service: General;  Laterality: Left;  PEC BLOCK   PORTACATH PLACEMENT Right 12/26/2019   Procedure: INSERTION PORT-A-CATH WITH ULTRASOUND GUIDANCE;  Surgeon: Lindsay Kussmaul, MD;  Location: [facility];  Service: General;  Laterality: Right;    I have reviewed the social history and family history with the patient and they are unchanged from previous note.  ALLERGIES:  is allergic to codeine.  MEDICATIONS:  Current Outpatient Medications  Medication Sig Dispense Refill   doxycycline (VIBRA-TABS) 100 MG tablet Take 1 tablet (100 mg total) by mouth 2 (two) times daily. 14 tablet 0   acetaminophen (TYLENOL) 650 MG CR tablet Take 650 mg by mouth every 8 (eight) hours as needed  for pain.      ascorbic acid (VITAMIN C) 250 MG CHEW Chew by mouth.     dexamethasone (DECADRON) 0.5 MG/5ML solution Take 10 ml (1 mg) swish for 2 minutes and then spit.  Do this four times daily as needed Do Not eat or drink for 1 hour after. 240 mL 0   diphenoxylate-atropine (LOMOTIL) 2.5-0.025 MG tablet Take 1-2 tablets by mouth 4 (four) times daily as needed for diarrhea or loose stools. 30 tablet 1   gabapentin (NEURONTIN) 300 MG capsule Take 300 mg by mouth 3 (three) times daily as needed.     lidocaine (XYLOCAINE) 2 % solution Use as directed 15 mLs in the mouth or throat every 4 (four) hours as needed for mouth pain. 100 mL 2   lidocaine-prilocaine (EMLA) cream Apply 1 application. topically as needed. 30 g 2   methocarbamol (ROBAXIN) 750 MG tablet Take 1 tablet (750 mg total) by mouth 4 (four) times daily as needed (use for muscle cramps/pain). 30 tablet 2   mucosal barrier oral (GELCLAIR) GEL Take 1 packet by mouth as  needed.     omeprazole (PRILOSEC) 20 MG capsule Take 1 capsule (20 mg total) by mouth daily. 30 capsule 2   valACYclovir (VALTREX) 1000 MG tablet Take 1 tablet (1,000 mg total) by mouth 2 (two) times daily. 14 tablet 1   No current facility-administered medications for this visit.    PHYSICAL EXAMINATION:  Vitals:   07/12/21 1215  BP: 124/72  Pulse: 64  Resp: 16  Temp: 98.4 F (36.9 C)  SpO2: 98%   Filed Weights   07/12/21 1215  Weight: 148 lb 3.2 oz (67.2 kg)    GENERAL:alert, no distress and comfortable SKIN: No rash.  Single raised erythematous and tender nodule with white center at the left neck.  See image below EYES: sclera clear NECK: Without mass LYMPH:  no palpable cervical, auricular, or supraclavicular lymphadenopathy LUNGS:  normal breathing effort HEART: Trace bilateral lower extremity edema NEURO: alert & oriented x 3 with fluent speech, no focal motor deficits Breast exam: S/p left mastectomy, incision/scar completely healed.  Skin tag to  left chest wall.  No palpable mass or nodularity along the left chest wall, right breast, or either axilla that I could appreciate.    LABORATORY DATA:  I have reviewed the data as listed CBC Latest Ref Rng & Units 06/01/2021 04/14/2021 03/02/2021  WBC 4.0 - 10.5 K/uL 4.3 4.9 3.6(L)  Hemoglobin 12.0 - 15.0 g/dL 12.4 11.8(L) 11.6(L)  Hematocrit 36.0 - 46.0 % 35.4(L) 33.5(L) 35.0(L)  Platelets 150 - 400 K/uL 152 155 128(L)     CMP Latest Ref Rng & Units 06/01/2021 04/14/2021 03/02/2021  Glucose 70 - 99 mg/dL 94 102(H) 98  BUN 8 - 23 mg/dL '19 16 19  ' Creatinine 0.44 - 1.00 mg/dL 0.59 0.65 0.69  Sodium 135 - 145 mmol/L 137 135 140  Potassium 3.5 - 5.1 mmol/L 3.9 4.1 4.2  Chloride 98 - 111 mmol/L 105 105 108  CO2 22 - 32 mmol/L '28 24 25  ' Calcium 8.9 - 10.3 mg/dL 8.7(L) 8.3(L) 8.6(L)  Total Protein 6.5 - 8.1 g/dL 5.7(L) 5.6(L) 5.4(L)  Total Bilirubin 0.3 - 1.2 mg/dL 0.4 0.3 0.3  Alkaline Phos 38 - 126 U/L 69 80 71  AST 15 - 41 U/L 35 52(H) 33  ALT 0 - 44 U/L '18 24 16      ' RADIOGRAPHIC STUDIES: I have personally reviewed the radiological images as listed and agreed with the findings in the report. No results found.   ASSESSMENT & PLAN: Lindsay [name] is a 66 y.o. female with    Left neck nodule -She developed a small red, raised, and tender nodule on the left neck 06/21/2021.  See image above -She denies insect bite or other known exposure -Exam shows a superficial subq erythematous nodule with a white center, likely infectious -Will treat with doxycycline 100 mg twice daily for 1 week, then follow-up 3/16.  She can do virtual/MyChart video visit with her new phone -If this has not resolved will refer to dermatology for biopsy -My suspicion that this is cancer related is very low  2. Left breast Multifocal Metaplastic cancer, pT3N3aM0, including one unresectable axillary node, metaplastic carcinoma, ER-/PR-/HER2-, grade IIIC -She was diagnosed in 10/2019 with left breast  metaplastic cancer, triple negative disease metastatic to left axillary LN. Her 11/2019 CT CAP and bone scan was negative for distant metastasis.  -S/p left mastectomy with Dr Marlou Starks on 12/26/19. Surgical path showed 6cm multifocal metaplastic carcinoma and 13/16 positive LN but unfortunately 1 positive LN was  not able to be removed due to vascular invasion.  -She was treated with adjuvant chemo carbo/taxol from 02/05/20 - 05/13/20 followed by Adriamycin/cytoxan 05/20/20 - 07/13/20 along with [facility] which she completed 01/2021. She tolerated AC poorly with mucositis, weight loss, constipation, and weakness. She recovered well, still has mild residual fatigue and neuropathy.  -interim left axillary [place] 06/17/20 is negative for adenopathy, c/w response to adjuvant chemo -She completed adjuvant radiation with Dr. Isidore Moos 08/24/20 - 10/05/20, she has recovered well  -restaging CT CAP 12/29/20 shows no evidence of residual disease or adenopathy, or metastatic disease.  -due to triple negative disease, she is not a candidate for anti-estrogen therapy.  -Currently on surveillance, next routine visit in 10/2021.  Continue port flush every 6-8 weeks   3.  Lower extremity skin rash  -Developed 12/2020, after almost 1 year on pembrolizumab -patient also works on a farm and as a Psychiatric nurse -etiology unknown -Resolved, not discussed today   4. G3 mucositis and weight loss -she developed mucositis after cycle 2 AC and lost 8 lbs. However, she has lost close to 20 lbs on [facility] and 30 lbs since starting chemo in 01/2020 -declined appetite stimulant or dietician.  -she has periodic mucositis flares on chemo and immunotherapy alone; uses gelclair and dex swish/spit sparingly.  -She still has occasional mouth soreness off treatment, uses Gelclair as needed   5. Hepatitis C, untreated, diagnosed in 1992 -pending referral to ID or [facility] for treatment after she completed chemo.  -monitoring LFTs and for possible hep C flare  while on chemo/immunotherapy -AST became elevated in 06/2020, 42-49 range, stable -LFTs normal 06/01/2021   6. Social and financial support  -She has no children and no close relatives. She does have close friends and church community who are supportive and strong faith.  -She did not have medical insurance for many years. She recently applied for blue cross and blue shield which is not effective yet.    7. Mild anemia -Secondary to chemotherapy, mild, will continue monitoring. -Improving -Hgb normal 12.4 on 06/01/2021   Plan: -Begin doxycycline 100 mg twice daily x1 week for left neck nodule -MyChart video visit 3/16 to follow-up -If not resolved will refer to dermatology -Continue port flush w lab in 6-8 weeks, and next breast cancer surveillance visit in 10/2021  All questions were answered. The patient knows to call the clinic with any problems, questions or concerns. No barriers to learning was detected. I spent 20 minutes counseling the patient face to face. The total time spent in the appointment was 30 minutes and more than 50% was on counseling and review of test results     Lindsay Feeling, NP 07/12/21

## 2021-07-12 ENCOUNTER — Inpatient Hospital Stay: Payer: Medicare Other | Attending: Nurse Practitioner | Admitting: Nurse Practitioner

## 2021-07-12 ENCOUNTER — Encounter: Payer: Self-pay | Admitting: Nurse Practitioner

## 2021-07-12 ENCOUNTER — Other Ambulatory Visit: Payer: Self-pay

## 2021-07-12 ENCOUNTER — Inpatient Hospital Stay: Payer: Medicare Other

## 2021-07-12 VITALS — BP 124/72 | HR 64 | Temp 98.4°F | Resp 16 | Wt 148.2 lb

## 2021-07-12 DIAGNOSIS — D649 Anemia, unspecified: Secondary | ICD-10-CM | POA: Insufficient documentation

## 2021-07-12 DIAGNOSIS — Z452 Encounter for adjustment and management of vascular access device: Secondary | ICD-10-CM | POA: Diagnosis present

## 2021-07-12 DIAGNOSIS — Z853 Personal history of malignant neoplasm of breast: Secondary | ICD-10-CM | POA: Diagnosis present

## 2021-07-12 DIAGNOSIS — R221 Localized swelling, mass and lump, neck: Secondary | ICD-10-CM | POA: Diagnosis present

## 2021-07-12 DIAGNOSIS — B1920 Unspecified viral hepatitis C without hepatic coma: Secondary | ICD-10-CM | POA: Insufficient documentation

## 2021-07-12 DIAGNOSIS — Z95828 Presence of other vascular implants and grafts: Secondary | ICD-10-CM

## 2021-07-12 MED ORDER — SODIUM CHLORIDE 0.9% FLUSH
10.0000 mL | Freq: Once | INTRAVENOUS | Status: AC
  Administered 2021-07-12: 10 mL

## 2021-07-12 MED ORDER — HEPARIN SOD (PORK) LOCK FLUSH 100 UNIT/ML IV SOLN
500.0000 [IU] | Freq: Once | INTRAVENOUS | Status: AC
  Administered 2021-07-12: 500 [IU]

## 2021-07-12 MED ORDER — DOXYCYCLINE HYCLATE 100 MG PO TABS: 100.0000 mg | ORAL_TABLET | Freq: Two times a day (BID) | ORAL | 0 refills | Status: DC

## 2021-07-12 MED ORDER — LIDOCAINE-PRILOCAINE 2.5-2.5 % EX CREA
1.0000 | TOPICAL_CREAM | CUTANEOUS | 2 refills | Status: DC | PRN
Start: 2021-07-12 — End: 2022-05-23

## 2021-07-14 ENCOUNTER — Inpatient Hospital Stay: Payer: Medicare Other

## 2021-07-21 ENCOUNTER — Inpatient Hospital Stay (HOSPITAL_BASED_OUTPATIENT_CLINIC_OR_DEPARTMENT_OTHER): Payer: Medicare Other | Admitting: Nurse Practitioner

## 2021-07-21 ENCOUNTER — Encounter: Payer: Self-pay | Admitting: Nurse Practitioner

## 2021-07-21 DIAGNOSIS — R221 Localized swelling, mass and lump, neck: Secondary | ICD-10-CM

## 2021-07-21 MED ORDER — DOXYCYCLINE HYCLATE 100 MG PO TABS: 100.0000 mg | ORAL_TABLET | Freq: Two times a day (BID) | ORAL | 0 refills | Status: DC

## 2021-07-21 NOTE — Progress Notes (Signed)
?[facility] [place]   ?Telephone:([phone] [phone]   ?Clinic Follow up Note  ? ?Patient Care Team: ?Scotty Court, DO as PCP - General ?Mauro Kaufmann, RN as Oncology Nurse Navigator ?Rockwell Germany, RN as Oncology Nurse Navigator ?[name], MD as Consulting Physician (General Surgery) ?Truitt Merle, MD as Consulting Physician (Hematology) ?Eppie Gibson, MD as Attending Physician (Radiation Oncology) ?Alla Feeling, NP as Nurse Practitioner (Nurse Practitioner) ?07/21/2021 ? ?I connected with Lindsay [name] on 07/21/21 at  1:00 PM EDT by video enabled telemedicine visit and verified that I am speaking with the correct person using two identifiers.  ? ?I discussed the limitations, risks, security and privacy concerns of performing an evaluation and management service by telemedicine and the availability of in-person appointments. I also discussed with the patient that there may be a patient responsible charge related to this service. The patient expressed understanding and agreed to proceed.  ? ?Other persons participating in the visit and their role in the encounter: none   ? ?Patient?s location: work  ?Provider?s location: [place] office   ? ?CHIEF COMPLAINT: Skin check left neck nodule/eruption in the setting of metaplastic breast cancer  ? ?CURRENT THERAPY: doxycycline 100 mg BID x1 week starting 07/12/21 ? ?INTERVAL HISTORY: Lindsay [name] presents for virtual follow-up as scheduled.  She completed her week on doxycycline, tolerated well with a probiotic.  The left neck nodule eventually came to ahead, she squeezed it and pus and blood came out.  This appeared similar to her previous MRSA infections.  She cleaned it with alcohol and packed it with saline soaked gauze.  Redness and tenderness have improved. ? ? ?MEDICAL HISTORY:  ?Past Medical History:  ?Diagnosis Date  ? Bronchitis   ? Family history of bone cancer   ? Family history of breast cancer   ? Family history of prostate  cancer   ? Fibromyalgia   ? HCV (hepatitis C virus)   ? MRSA (methicillin resistant Staphylococcus aureus) 2012  ? ? ?SURGICAL HISTORY: ?Past Surgical History:  ?Procedure Laterality Date  ? MASTECTOMY MODIFIED RADICAL Left 12/26/2019  ? Procedure: LEFT MASTECTOMY MODIFIED RADICAL;  Surgeon: [name], MD;  Location: [facility];  Service: General;  Laterality: Left;  PEC BLOCK  ? PORTACATH PLACEMENT Right 12/26/2019  ? Procedure: INSERTION PORT-A-CATH WITH ULTRASOUND GUIDANCE;  Surgeon: [name], MD;  Location: [facility];  Service: General;  Laterality: Right;  ? ? ?I have reviewed the social history and family history with the patient and they are unchanged from previous note. ? ?ALLERGIES:  is allergic to codeine. ? ?MEDICATIONS:  ?Current Outpatient Medications  ?Medication Sig Dispense Refill  ? acetaminophen (TYLENOL) 650 MG CR tablet Take 650 mg by mouth every 8 (eight) hours as needed for pain.     ? ascorbic acid (VITAMIN C) 250 MG CHEW Chew by mouth.    ? dexamethasone (DECADRON) 0.5 MG/5ML solution Take 10 ml (1 mg) swish for 2 minutes and then spit.  Do this four times daily as needed Do Not eat or drink for 1 hour after. 240 mL 0  ? diphenoxylate-atropine (LOMOTIL) 2.5-0.025 MG tablet Take 1-2 tablets by mouth 4 (four) times daily as needed for diarrhea or loose stools. 30 tablet 1  ? doxycycline (VIBRA-TABS) 100 MG tablet Take 1 tablet (100 mg total) by mouth 2 (two) times daily. 14 tablet 0  ? gabapentin (NEURONTIN) 300 MG capsule Take 300 mg by mouth 3 (three)  times daily as needed.    ? lidocaine (XYLOCAINE) 2 % solution Use as directed 15 mLs in the mouth or throat every 4 (four) hours as needed for mouth pain. 100 mL 2  ? lidocaine-prilocaine (EMLA) cream Apply 1 application. topically as needed. 30 g 2  ? methocarbamol (ROBAXIN) 750 MG tablet Take 1 tablet (750 mg total) by mouth 4 (four) times daily as needed (use for muscle cramps/pain). 30 tablet 2  ? mucosal barrier oral (GELCLAIR) GEL Take 1  packet by mouth as needed.    ? omeprazole (PRILOSEC) 20 MG capsule Take 1 capsule (20 mg total) by mouth daily. 30 capsule 2  ? valACYclovir (VALTREX) 1000 MG tablet Take 1 tablet (1,000 mg total) by mouth 2 (two) times daily. 14 tablet 1  ? ?No current facility-administered medications for this visit.  ? ? ?PHYSICAL EXAMINATION: ?There were no vitals filed for this visit. ?There were no vitals filed for this visit. ? ?Patient appears well over the phone.  Voice is strong, speech is clear.  Small round raised erythematous nodule to the left neck.  No other visualized skin rash or eruption ? ?LABORATORY DATA:  ?No lab data for this visit ? ?RADIOGRAPHIC STUDIES: ?I have personally reviewed the radiological images as listed and agreed with the findings in the report. ?No results found.  ? 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 ?Left neck nodule ?-She developed a small red, raised, and tender nodule on the left neck 06/21/2021.  ?-She denies insect bite or other known exposure ?-Exam 07/12/2021 showed a superficial subq erythematous nodule with a white center, likely infectious ?-The nodule came to a head, she squeezed it with purulent drainage while completing a week of doxycycline which she completed 07/19/2021 ?-Tenderness and erythema have improved ?-she works in a [employer], has animals, and is outside a lot. She has risk for recurrent infection ?- given her history of MRSA I recommend an additional week of doxycycline 100 mg twice daily, refill sent to her pharmacy ?-We will hold on Derm referral for now. ?-She knows to call me back if this worsens or does not resolve in the next 2-4 weeks, or new symptoms appear ?-We will add labs to next flush in April, then follow-up in June ? ? ?PLAN: ?-refill doxycycline 100 mg BID x1 week ?-flush with lab 08/23/21  as scheduled ?-next routine f/up 11/03/21, or sooner if needed ? ?I discussed the assessment and treatment plan with the patient. The patient was provided an opportunity to ask questions and all were answered. The patient agreed with the plan and demonstrated an understanding of the instructions. ?  ?The patient was advised to call back or seek an in-person evaluation if the symptoms worsen or if the condition fails to improve as anticipated.  I spent 10 minutes on today's video call. ?  ? Alla Feeling, NP ?07/21/21  ? ?  ?

## 2021-08-15 ENCOUNTER — Encounter: Payer: Self-pay | Admitting: Nurse Practitioner

## 2021-08-23 ENCOUNTER — Ambulatory Visit
Admission: RE | Admit: 2021-08-23 | Discharge: 2021-08-23 | Disposition: A | Discharge status: Home / Self Care | Payer: Medicare Other | Source: Ambulatory Visit | Attending: Nurse Practitioner | Admitting: Nurse Practitioner

## 2021-08-23 ENCOUNTER — Inpatient Hospital Stay: Payer: Medicare Other | Attending: Nurse Practitioner

## 2021-08-23 ENCOUNTER — Ambulatory Visit
Admission: RE | Admit: 2021-08-23 | Discharge: 2021-08-23 | Disposition: A | Discharge status: Home / Self Care | Payer: Medicare Other | Source: Ambulatory Visit | Attending: Hematology | Admitting: Hematology

## 2021-08-23 ENCOUNTER — Other Ambulatory Visit: Payer: Self-pay

## 2021-08-23 DIAGNOSIS — Z171 Estrogen receptor negative status [ER-]: Secondary | ICD-10-CM | POA: Insufficient documentation

## 2021-08-23 DIAGNOSIS — C50412 Malignant neoplasm of upper-outer quadrant of left female breast: Secondary | ICD-10-CM | POA: Diagnosis present

## 2021-08-23 DIAGNOSIS — Z1231 Encounter for screening mammogram for malignant neoplasm of breast: Secondary | ICD-10-CM

## 2021-08-23 DIAGNOSIS — R221 Localized swelling, mass and lump, neck: Secondary | ICD-10-CM | POA: Diagnosis not present

## 2021-08-23 DIAGNOSIS — Z95828 Presence of other vascular implants and grafts: Secondary | ICD-10-CM

## 2021-08-23 LAB — CMP (CANCER CENTER ONLY)
ALT: 22 U/L (ref 0–44)
AST: 42 U/L — ABNORMAL HIGH (ref 15–41)
Albumin: 3.8 g/dL (ref 3.5–5.0)
Alkaline Phosphatase: 62 U/L (ref 38–126)
Anion gap: 5 (ref 5–15)
BUN: 25 mg/dL — ABNORMAL HIGH (ref 8–23)
CO2: 26 mmol/L (ref 22–32)
Calcium: 8.8 mg/dL — ABNORMAL LOW (ref 8.9–10.3)
Chloride: 101 mmol/L (ref 98–111)
Creatinine: 0.67 mg/dL (ref 0.44–1.00)
GFR, Estimated: >60 mL/min (ref >60)
Glucose, Bld: 92 mg/dL (ref 70–99)
Potassium: 4 mmol/L (ref 3.5–5.1)
Sodium: 132 mmol/L — ABNORMAL LOW (ref 135–145)
Total Bilirubin: 0.4 mg/dL (ref 0.3–1.2)
Total Protein: 5.9 g/dL — ABNORMAL LOW (ref 6.5–8.1)

## 2021-08-23 LAB — CBC WITH DIFFERENTIAL (CANCER CENTER ONLY)
Abs Immature Granulocytes: 0.01 10*3/uL (ref 0.00–0.07)
Basophils Absolute: 0 10*3/uL (ref 0.0–0.1)
Basophils Relative: 1 %
Eosinophils Absolute: 0.3 10*3/uL (ref 0.0–0.5)
Eosinophils Relative: 5 %
HCT: 36.6 % (ref 36.0–46.0)
Hemoglobin: 12.6 g/dL (ref 12.0–15.0)
Immature Granulocytes: 0 %
Lymphocytes Relative: 33 %
Lymphs Abs: 1.7 10*3/uL (ref 0.7–4.0)
MCH: 31.9 pg (ref 26.0–34.0)
MCHC: 34.4 g/dL (ref 30.0–36.0)
MCV: 92.7 fL (ref 80.0–100.0)
Monocytes Absolute: 0.6 10*3/uL (ref 0.1–1.0)
Monocytes Relative: 11 %
Neutro Abs: 2.6 10*3/uL (ref 1.7–7.7)
Neutrophils Relative %: 50 %
Platelet Count: 162 10*3/uL (ref 150–400)
RBC: 3.95 MIL/uL (ref 3.87–5.11)
RDW: 12.9 % (ref 11.5–15.5)
WBC Count: 5.2 10*3/uL (ref 4.0–10.5)
nRBC: 0 % (ref 0.0–0.2)

## 2021-08-23 MED ORDER — SODIUM CHLORIDE 0.9% FLUSH
10.0000 mL | Freq: Once | INTRAVENOUS | Status: AC
  Administered 2021-08-23: 10 mL

## 2021-08-23 MED ORDER — HEPARIN SOD (PORK) LOCK FLUSH 100 UNIT/ML IV SOLN
500.0000 [IU] | Freq: Once | INTRAVENOUS | Status: AC
  Administered 2021-08-23: 500 [IU]

## 2021-08-26 ENCOUNTER — Telehealth: Payer: Self-pay | Admitting: Hematology

## 2021-08-26 NOTE — Telephone Encounter (Signed)
.  Called patient to schedule appointment per 4/0 inbasket, patient is aware of date and time.   ?

## 2021-10-05 ENCOUNTER — Other Ambulatory Visit: Payer: Self-pay

## 2021-10-05 DIAGNOSIS — C50412 Malignant neoplasm of upper-outer quadrant of left female breast: Secondary | ICD-10-CM

## 2021-10-06 ENCOUNTER — Inpatient Hospital Stay: Payer: Medicare Other | Attending: Nurse Practitioner

## 2021-10-06 ENCOUNTER — Other Ambulatory Visit: Payer: Self-pay

## 2021-10-06 DIAGNOSIS — Z452 Encounter for adjustment and management of vascular access device: Secondary | ICD-10-CM | POA: Diagnosis not present

## 2021-10-06 DIAGNOSIS — C50412 Malignant neoplasm of upper-outer quadrant of left female breast: Secondary | ICD-10-CM | POA: Insufficient documentation

## 2021-10-06 DIAGNOSIS — Z95828 Presence of other vascular implants and grafts: Secondary | ICD-10-CM

## 2021-10-06 DIAGNOSIS — Z171 Estrogen receptor negative status [ER-]: Secondary | ICD-10-CM

## 2021-10-06 LAB — CMP (CANCER CENTER ONLY)
ALT: 21 U/L (ref 0–44)
AST: 38 U/L (ref 15–41)
Albumin: 3.9 g/dL (ref 3.5–5.0)
Alkaline Phosphatase: 55 U/L (ref 38–126)
Anion gap: 4 — ABNORMAL LOW (ref 5–15)
BUN: 22 mg/dL (ref 8–23)
CO2: 28 mmol/L (ref 22–32)
Calcium: 9.2 mg/dL (ref 8.9–10.3)
Chloride: 104 mmol/L (ref 98–111)
Creatinine: 0.7 mg/dL (ref 0.44–1.00)
GFR, Estimated: >60 mL/min (ref >60)
Glucose, Bld: 107 mg/dL — ABNORMAL HIGH (ref 70–99)
Potassium: 4.1 mmol/L (ref 3.5–5.1)
Sodium: 136 mmol/L (ref 135–145)
Total Bilirubin: 0.4 mg/dL (ref 0.3–1.2)
Total Protein: 5.9 g/dL — ABNORMAL LOW (ref 6.5–8.1)

## 2021-10-06 LAB — CBC WITH DIFFERENTIAL (CANCER CENTER ONLY)
Abs Immature Granulocytes: 0.01 10*3/uL (ref 0.00–0.07)
Basophils Absolute: 0 10*3/uL (ref 0.0–0.1)
Basophils Relative: 1 %
Eosinophils Absolute: 0.2 10*3/uL (ref 0.0–0.5)
Eosinophils Relative: 4 %
HCT: 37.5 % (ref 36.0–46.0)
Hemoglobin: 12.8 g/dL (ref 12.0–15.0)
Immature Granulocytes: 0 %
Lymphocytes Relative: 28 %
Lymphs Abs: 1.3 10*3/uL (ref 0.7–4.0)
MCH: 32.2 pg (ref 26.0–34.0)
MCHC: 34.1 g/dL (ref 30.0–36.0)
MCV: 94.2 fL (ref 80.0–100.0)
Monocytes Absolute: 0.4 10*3/uL (ref 0.1–1.0)
Monocytes Relative: 10 %
Neutro Abs: 2.5 10*3/uL (ref 1.7–7.7)
Neutrophils Relative %: 57 %
Platelet Count: 125 10*3/uL — ABNORMAL LOW (ref 150–400)
RBC: 3.98 MIL/uL (ref 3.87–5.11)
RDW: 13 % (ref 11.5–15.5)
WBC Count: 4.4 10*3/uL (ref 4.0–10.5)
nRBC: 0 % (ref 0.0–0.2)

## 2021-10-06 MED ORDER — HEPARIN SOD (PORK) LOCK FLUSH 100 UNIT/ML IV SOLN
500.0000 [IU] | Freq: Once | INTRAVENOUS | Status: AC
  Administered 2021-10-06: 500 [IU]

## 2021-10-06 MED ORDER — SODIUM CHLORIDE 0.9% FLUSH
10.0000 mL | Freq: Once | INTRAVENOUS | Status: AC
  Administered 2021-10-06: 10 mL

## 2021-10-27 ENCOUNTER — Telehealth: Payer: Self-pay | Admitting: Hematology

## 2021-10-27 NOTE — Telephone Encounter (Signed)
.  Called patient to schedule appointment per 6/22 inbasket, patient is aware of date and time.

## 2021-11-03 ENCOUNTER — Other Ambulatory Visit: Payer: Medicare Other

## 2021-11-03 ENCOUNTER — Ambulatory Visit: Payer: Medicare Other | Admitting: Hematology

## 2021-11-15 ENCOUNTER — Encounter: Payer: Self-pay | Admitting: Hematology

## 2021-11-15 ENCOUNTER — Inpatient Hospital Stay: Payer: Medicare Other | Attending: Nurse Practitioner

## 2021-11-15 ENCOUNTER — Inpatient Hospital Stay (HOSPITAL_BASED_OUTPATIENT_CLINIC_OR_DEPARTMENT_OTHER): Payer: Medicare Other | Admitting: Hematology

## 2021-11-15 ENCOUNTER — Other Ambulatory Visit: Payer: Self-pay

## 2021-11-15 VITALS — BP 149/75 | HR 68 | Temp 97.4°F | Resp 18 | Ht 61.0 in | Wt 157.0 lb

## 2021-11-15 DIAGNOSIS — C773 Secondary and unspecified malignant neoplasm of axilla and upper limb lymph nodes: Secondary | ICD-10-CM | POA: Insufficient documentation

## 2021-11-15 DIAGNOSIS — C50412 Malignant neoplasm of upper-outer quadrant of left female breast: Secondary | ICD-10-CM | POA: Insufficient documentation

## 2021-11-15 DIAGNOSIS — Z95828 Presence of other vascular implants and grafts: Secondary | ICD-10-CM

## 2021-11-15 DIAGNOSIS — Z452 Encounter for adjustment and management of vascular access device: Secondary | ICD-10-CM | POA: Insufficient documentation

## 2021-11-15 DIAGNOSIS — D6481 Anemia due to antineoplastic chemotherapy: Secondary | ICD-10-CM | POA: Insufficient documentation

## 2021-11-15 DIAGNOSIS — B1920 Unspecified viral hepatitis C without hepatic coma: Secondary | ICD-10-CM | POA: Diagnosis not present

## 2021-11-15 DIAGNOSIS — Z171 Estrogen receptor negative status [ER-]: Secondary | ICD-10-CM | POA: Diagnosis not present

## 2021-11-15 LAB — CBC WITH DIFFERENTIAL (CANCER CENTER ONLY)
Abs Immature Granulocytes: 0.01 10*3/uL (ref 0.00–0.07)
Basophils Absolute: 0 10*3/uL (ref 0.0–0.1)
Basophils Relative: 1 %
Eosinophils Absolute: 0.1 10*3/uL (ref 0.0–0.5)
Eosinophils Relative: 4 %
HCT: 37.7 % (ref 36.0–46.0)
Hemoglobin: 12.8 g/dL (ref 12.0–15.0)
Immature Granulocytes: 0 %
Lymphocytes Relative: 36 %
Lymphs Abs: 1.3 10*3/uL (ref 0.7–4.0)
MCH: 32.1 pg (ref 26.0–34.0)
MCHC: 34 g/dL (ref 30.0–36.0)
MCV: 94.5 fL (ref 80.0–100.0)
Monocytes Absolute: 0.4 10*3/uL (ref 0.1–1.0)
Monocytes Relative: 9 %
Neutro Abs: 1.8 10*3/uL (ref 1.7–7.7)
Neutrophils Relative %: 50 %
Platelet Count: 148 10*3/uL — ABNORMAL LOW (ref 150–400)
RBC: 3.99 MIL/uL (ref 3.87–5.11)
RDW: 13.1 % (ref 11.5–15.5)
WBC Count: 3.7 10*3/uL — ABNORMAL LOW (ref 4.0–10.5)
nRBC: 0 % (ref 0.0–0.2)

## 2021-11-15 LAB — CMP (CANCER CENTER ONLY)
ALT: 17 U/L (ref 0–44)
AST: 31 U/L (ref 15–41)
Albumin: 3.8 g/dL (ref 3.5–5.0)
Alkaline Phosphatase: 50 U/L (ref 38–126)
Anion gap: 2 — ABNORMAL LOW (ref 5–15)
BUN: 17 mg/dL (ref 8–23)
CO2: 29 mmol/L (ref 22–32)
Calcium: 9.1 mg/dL (ref 8.9–10.3)
Chloride: 106 mmol/L (ref 98–111)
Creatinine: 0.7 mg/dL (ref 0.44–1.00)
GFR, Estimated: >60 mL/min (ref >60)
Glucose, Bld: 97 mg/dL (ref 70–99)
Potassium: 4.4 mmol/L (ref 3.5–5.1)
Sodium: 137 mmol/L (ref 135–145)
Total Bilirubin: 0.3 mg/dL (ref 0.3–1.2)
Total Protein: 5.9 g/dL — ABNORMAL LOW (ref 6.5–8.1)

## 2021-11-15 MED ORDER — HEPARIN SOD (PORK) LOCK FLUSH 100 UNIT/ML IV SOLN
500.0000 [IU] | Freq: Once | INTRAVENOUS | Status: AC
  Administered 2021-11-15: 500 [IU]

## 2021-11-15 MED ORDER — SODIUM CHLORIDE 0.9% FLUSH
10.0000 mL | Freq: Once | INTRAVENOUS | Status: AC
  Administered 2021-11-15: 10 mL

## 2021-11-15 NOTE — Progress Notes (Signed)
[place]   [phone] [phone]) [phone]   Clinic Follow up Note   Patient Care Team: Scotty Court, DO as PCP - General Mauro Kaufmann, RN as Oncology Nurse Navigator Rockwell Germany, RN as Oncology Nurse Navigator [name], MD as Consulting Physician (General Surgery) Truitt Merle, MD as Consulting Physician (Hematology) Eppie Gibson, MD as Attending Physician (Radiation Oncology) Alla Feeling, NP as Nurse Practitioner (Nurse Practitioner)  Date of Service:  11/15/2021  CHIEF COMPLAINT: f/u of left breast metaplastic cancer  CURRENT THERAPY:  Surveillance  ASSESSMENT & PLAN:  Lindsay [name] is a 66 y.o. female with   1. 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 Her 11/2019 CT CAP and bone scan was negative for distant metastasis.  -S/p left mastectomy with Dr Marlou Starks on 12/26/19. Surgical path showed 6cm multifocal metaplastic carcinoma and 13/16 positive LN but unfortunately 1 positive LN was not able to be removed due to vascular invasion.  -She was treated with adjuvant chemo carbo/taxol from 02/05/20 - 05/13/20 followed by Adriamycin/cytoxan 05/20/20 - 07/13/20 along with Ballard Russell through 01/19/21.  She tolerated AC poorly with mucositis, weight loss, constipation, and weakness. She is recovering well -I do not recommend adjuvant Xeloda, I think the benefit is small due to her histology  -interim left axillary [place] 06/17/20 is negative for adenopathy, c/w response to adjuvant chemo -She completed adjuvant radiation with Dr. Isidore Moos 08/24/20 - 10/05/20, she has recovered well  -restaging CT CAP 12/29/20 shows no evidence of residual disease or adenopathy, or metastatic disease.  -she is now under surveillance since completing  [place]. -she is due for mammogram, which has been delayed due to a death in her family. I ordered right MM and left [place] for her. -from a breast cancer standpoint, she is doing well. Labs reviewed, overall stable. Physical exam unremarkable -she will keep her port for now. She will continue to return for flush every 8 weeks. -due to her known metastatic lymph node in the left axilla which was not able to remove by surgery, I will repeat CT scan before next visit in 2 months.   3. Hepatitis C, untreated, diagnosed in 1992 -LFTs overall sable    4. Social and financial support  -She has no children and no close relatives. She does have close friends and church community who are supportive and strong faith.  -She did not have medical insurance for many years. She is currently on Medicare and BCBS.   5. Mild anemia -Secondary to chemotherapy, mild, will continue monitoring. -Hgb WNL today at 12.4 (06/01/21)   6. Covid-19+ 02/02/21 -she experienced body aches, fatigue, congestion, cough, and sore throat. -she was treated with azithromysin and paxlovid.     PLAN: -follow-up in 2 months with lab and CT chest, abdomen pelvis with contrast a few days before.   No problem-specific Assessment & Plan notes found for this encounter.   SUMMARY OF ONCOLOGIC HISTORY: Oncology History Overview Note  Cancer Staging Malignant neoplasm of upper-outer quadrant of left breast in female, estrogen receptor negative ([place]) Staging form: Breast, AJCC 8th Edition - Clinical stage from 10/28/2019: Stage IIIB (cT2, cN1, cM0, G3, ER-, PR-, HER2-) - Signed by Eppie Gibson, MD on 11/05/2019 - Pathologic stage from 12/26/2019: Stage IIIC (pT3, pN3a, cM0, G3, ER-, PR-, HER2-) - Signed  by Truitt Merle, MD on 01/28/2020    Malignant neoplasm of upper-outer quadrant of left breast in female, estrogen receptor negative ([place])  10/27/2019 Mammogram   IMPRESSION: 1. Right breast calcifications spanning 2.6 cm in the upper  slightly medial breast are indeterminate.   2. Left breast dominant mass at 3 o'clock, 6 cm from nipple measuring 3x3.9x2.9 cm is highly suspicious. There is overlying skin thickening, skin involvement is not excluded.   3. Left breast mass/distorted tissue at 1 o'clock, 4cm from nipple measuring 3.0x1.2x2.9 cm is likely in contiguity with the dominant mass at 3 o'clock and is also highly suspicious. With the dominant lesion the overall span a disease is approximately 6 cm.   4. Left breast distortion at 2 o'clock 10 cm from the nipple is Suspicious, measuring 1.0 x 0.8 cm.   5.  Abnormal left axillary lymph nodes (2). Cortical thickness measures up to 0.6 cm.    10/28/2019 Initial Biopsy   Diagnosis 1. Breast, left, needle core biopsy, 3 o'clock, 5cmfn - INVASIVE MAMMARY CARCINOMA. 2. Breast, left, needle core biopsy, 2 o'clock, 10cmfn - INVASIVE MAMMARY CARCINOMA. 3. 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 Microscopic Comment 1. -3. Overall, immunohistochemistry favors a pronounced desmoplastic response, but metaplastic carcinoma cannot be excluded (Epithelioid component: CKAE1AE3 strong +, CK5/6 weak +). The greatest linear extent of tumor in any one core in specimen 1 is 14 mm. The greatest linear extent of tumor in any one core in specimen 2 is 16 mm.   10/28/2019 Receptors her2   3. PROGNOSTIC INDICATORS Results: IMMUNOHISTOCHEMICAL AND MORPHOMETRIC ANALYSIS PERFORMED MANUALLY The tumor cells are EQUIVOCAL for Her2 (2+). Her2 by FISH will be performed and results reported separately. Estrogen Receptor: 0%, NEGATIVE Progesterone Receptor: 0%, NEGATIVE Proliferation Marker Ki67: 10%    3. FLUORESCENCE IN-SITU HYBRIDIZATION Results: GROUP 5: HER2 **NEGATIVE** Equivocal form of amplification of the HER2 gene was detected in the IHC 2+ tissue sample received from this individual. HER2 FISH was performed by a  technologist and cell imaging and analysis on the [employer].   10/28/2019 Cancer Staging   Staging form: Breast, AJCC 8th Edition - Clinical stage from 10/28/2019: Stage IIIB (cT2, cN1, cM0, G3, ER-, PR-, HER2-) - Signed by Eppie Gibson, MD on 11/05/2019   10/31/2019 Initial Diagnosis   Malignant neoplasm of upper-outer quadrant of left breast in female, estrogen receptor negative ([place])   10/31/2019 Pathology Results   Diagnosis Breast, right, needle core biopsy, UOQ, posterior - FOCAL USUAL DUCTAL HYPERPLASIA AND FIBROCYSTIC CHANGES WITH CALCIFICATIONS - FIBROADENOMATOID CHANGES - NO MALIGNANCY IDENTIFIED Microscopic Comment These results were called to [facility] on November 03, 2019.   11/05/2019 Genetic Testing   She declined Genetic testing    11/21/2019 Imaging   CT CAP w contrast  IMPRESSION: 1. Left breast mass and prominent left axillary lymph nodes, containing biopsy marking clips, consistent with newly diagnosed breast malignancy. 2. No definite evidence of distant metastatic disease in the chest, abdomen, or pelvis. 3. There are occasional small pulmonary nodules, measuring 2-3 mm, most likely incidental sequelae of prior infection or inflammation although metastatic disease is not excluded. Attention on follow-up. 4. Hepatic steatosis. 5. Aortic Atherosclerosis (ICD10-I70.0).     11/21/2019 Imaging   Bone Scan Whole Body IMPRESSION: 1. Single focus of uptake associated with a subacute fracture of LEFT anterior fourth rib. 2. No additional areas of abnormal uptake.   12/26/2019 Cancer Staging  Staging form: Breast, AJCC 8th Edition - Pathologic stage from 12/26/2019: Stage IIIC (pT3, pN3a, cM0, G3, ER-, PR-, HER2-) - Signed by Truitt Merle, MD on 01/28/2020   12/26/2019 Surgery   LEFT MASTECTOMY MODIFIED RADICAL and PAC Placement by Dr Marlou Starks   12/26/2019 Pathology Results   FINAL MICROSCOPIC DIAGNOSIS:   A. BREAST, LEFT, MODIFIED RADICAL MASTECTOMY:   - Metaplastic carcinoma, multifocal, 6 cm in greatest dimension,  Nottingham grade 3 of 3.  - Ductal carcinoma in situ, high nuclear grade with central necrosis.  - Margins of resection:  - Metaplastic carcinoma focally involves the anterior margin and is < 1  mm from the posterior margin.  - DCIS is < 1 mm from the anterior margin.  - Metastatic carcinoma in (13) of (16) lymph nodes with extranodal  extension.  - Biopsy clip sites in breast and one lymph node.  - See oncology table.    ADDENDUM:  PROGNOSTIC INDICATOR RESULTS:  Immunohistochemical and morphometric analysis performed manually  The tumor cells are EQUIVOCAL for Her2 (2+). Her2 FISH has been ordered  and will be reported in an addendum.  Estrogen Receptor:       NEGATIVE  Progesterone Receptor:   NEGATIVE  Proliferation Marker Ki-67:   30%    ADDENDUM:  FLOURESCENCE IN-SITU HYBRIDIZATION RESULTS:  GROUP 5:   HER2 NEGATIVE   01/28/2020 Echocardiogram   Baseline Echo  IMPRESSIONS     1. Left ventricular ejection fraction, by estimation, is 60 to 65%. The  left ventricle has normal function. The left ventricle has no regional  wall motion abnormalities. Left ventricular diastolic parameters are  consistent with Grade I diastolic  dysfunction (impaired relaxation). The average left ventricular global  longitudinal strain is -19.8 %.   2. Right ventricular systolic function is normal. The right ventricular  size is normal. Tricuspid regurgitation signal is inadequate for assessing  PA pressure.   3. The mitral valve is normal in structure. No evidence of mitral valve  regurgitation. No evidence of mitral stenosis.   4. The aortic valve is normal in structure. Aortic valve regurgitation is  not visualized. No aortic stenosis is present.   5. The inferior vena cava is normal in size with greater than 50%  respiratory variability, suggesting right atrial pressure of 3 mmHg.    02/05/2020 - 01/19/2021 Chemotherapy    Keytruda q3weeks (to be taken for 1 whole year) with weekly Carboplatin/Taxol for 12 weeks starting 02/05/20-05/13/20 followed by Adriamycin/Cytoxan q2weeks X4 starting 05/20/20-07/13/20.  ----Continue Keytruda q3weeks from 08/03/20 to complete 1 year of treatment from 02/05/20).    06/17/2020 Breast US   FINDINGS: On physical exam,well-healed scars of LEFT mastectomy and LEFT axillary node dissection. I palpate no axillary mass. Ultrasound is performed, showing normal appearing LEFT axillary contents. No mass or enlarged lymph nodes. IMPRESSION: Ultrasound is negative for LEFT axillary adenopathy.   08/24/2020 - 10/05/2020 Radiation Therapy   Adjuvant RT per Dr. Isidore Moos starting 08/24/20-10/05/20   04/14/2021 Survivorship   SCP delivered by Cira Rue, NP      INTERVAL HISTORY:  Sanda Linger is here for a follow up of breast cancer. She was last seen by me on 03/02/21 with survivorship visit in the interim. She presents to the clinic alone. 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 She has mild left axillary pain, (+) intermittent headache, sometime related to  poor sleep, tylenol helps. Her neuropathy is stable. No other new complains.    All other systems were reviewed with the patient and are negative.  MEDICAL HISTORY:  Past Medical History:  Diagnosis Date   Bronchitis    Family history of bone cancer    Family history of breast cancer    Family history of prostate cancer    Fibromyalgia    HCV (hepatitis C virus)    MRSA (methicillin resistant Staphylococcus aureus) 2012    SURGICAL HISTORY: Past Surgical History:  Procedure Laterality Date   MASTECTOMY MODIFIED RADICAL Left 12/26/2019   Procedure: LEFT MASTECTOMY MODIFIED RADICAL;  Surgeon: [name], MD;  Location: [place] [facility];  Service: General;  Laterality: Left;  PEC BLOCK   PORTACATH PLACEMENT Right  12/26/2019   Procedure: INSERTION PORT-A-CATH WITH ULTRASOUND GUIDANCE;  Surgeon: [name], MD;  Location: [facility];  Service: General;  Laterality: Right;    I have reviewed the social history and family history with the patient and they are unchanged from previous note.  ALLERGIES:  is allergic to codeine.  MEDICATIONS:  Current Outpatient Medications  Medication Sig Dispense Refill   acetaminophen (TYLENOL) 650 MG CR tablet Take 650 mg by mouth every 8 (eight) hours as needed for pain.      ascorbic acid (VITAMIN C) 250 MG CHEW Chew by mouth.     dexamethasone (DECADRON) 0.5 MG/5ML solution Take 10 ml (1 mg) swish for 2 minutes and then spit.  Do this four times daily as needed Do Not eat or drink for 1 hour after. 240 mL 0   diphenoxylate-atropine (LOMOTIL) 2.5-0.025 MG tablet Take 1-2 tablets by mouth 4 (four) times daily as needed for diarrhea or loose stools. 30 tablet 1   doxycycline (VIBRA-TABS) 100 MG tablet Take 1 tablet (100 mg total) by mouth 2 (two) times daily. 14 tablet 0   gabapentin (NEURONTIN) 300 MG capsule Take 300 mg by mouth 3 (three) times daily as needed.     lidocaine (XYLOCAINE) 2 % solution Use as directed 15 mLs in the mouth or throat every 4 (four) hours as needed for mouth pain. 100 mL 2   lidocaine-prilocaine (EMLA) cream Apply 1 application. topically as needed. 30 g 2   methocarbamol (ROBAXIN) 750 MG tablet Take 1 tablet (750 mg total) by mouth 4 (four) times daily as needed (use for muscle cramps/pain). 30 tablet 2   mucosal barrier oral (GELCLAIR) GEL Take 1 packet by mouth as needed.     omeprazole (PRILOSEC) 20 MG capsule Take 1 capsule (20 mg total) by mouth daily. 30 capsule 2   valACYclovir (VALTREX) 1000 MG tablet Take 1 tablet (1,000 mg total) by mouth 2 (two) times daily. 14 tablet 1   No current facility-administered medications for this visit.    PHYSICAL EXAMINATION: ECOG PERFORMANCE STATUS: 1 - Symptomatic but completely  ambulatory  Vitals:   11/15/21 1220  BP: (!) 149/75  Pulse: 68  Resp: 18  Temp: (!) 97.4 F (36.3 C)  SpO2: 100%    Wt Readings from Last 3 Encounters:  11/15/21 157 lb (71.2 kg)  07/12/21 148 lb 3.2 oz (67.2 kg)  06/01/21 141 lb 9.6 oz (64.2 kg)     GENERAL:alert, no distress and comfortable SKIN: skin color, texture, turgor are normal, no rashes or significant lesions EYES: normal, Conjunctiva are pink and non-injected, sclera clear  NECK: supple, thyroid normal size, non-tender, without nodularity LYMPH:  no palpable lymphadenopathy in the cervical,  axillary  LUNGS: clear to auscultation and percussion with normal breathing effort HEART: regular rate & rhythm and no murmurs and no lower extremity edema ABDOMEN:abdomen soft, non-tender and normal bowel sounds Musculoskeletal:no cyanosis of digits and no clubbing  NEURO: alert & oriented x 3 with fluent speech, no focal motor/sensory deficits BREAST: Status post left mastectomy, no palpable nodule on left chest wall.  Right breast exam was unremarkable.  No palpable mass, nodules or adenopathy bilaterally.   LABORATORY DATA:  I have reviewed the data as listed    Latest Ref Rng & Units 11/15/2021   11:53 AM 10/06/2021   11:50 AM 08/23/2021   10:46 AM  CBC  WBC 4.0 - 10.5 K/uL 3.7  4.4  5.2   Hemoglobin 12.0 - 15.0 g/dL 12.8  12.8  12.6   Hematocrit 36.0 - 46.0 % 37.7  37.5  36.6   Platelets 150 - 400 K/uL 148  125  162         Latest Ref Rng & Units 11/15/2021   11:53 AM 10/06/2021   11:50 AM 08/23/2021   10:46 AM  CMP  Glucose 70 - 99 mg/dL 97  107  92   BUN 8 - 23 mg/dL _0 Creatinine 0.44 - 1.00 mg/dL 0.70  0.70  0.67   Sodium 135 - 145 mmol/L 137  136  132   Potassium 3.5 - 5.1 mmol/L 4.4  4.1  4.0   Chloride 98 - 111 mmol/L 106  104  101   CO2 22 - 32 mmol/L _1 Calcium 8.9 - 10.3 mg/dL 9.1  9.2  8.8   Total Protein 6.5 - 8.1 g/dL 5.9  5.9  5.9   Total Bilirubin 0.3 - 1.2 mg/dL 0.3  0.4  0.4    Alkaline Phos 38 - 126 U/L 50  55  62   AST 15 - 41 U/L 31  38  42   ALT 0 - 44 U/L _2 RADIOGRAPHIC STUDIES: I have personally reviewed the radiological images as listed and agreed with the findings in the report. No results found.    Orders Placed This Encounter  Procedures   CT CHEST ABDOMEN PELVIS W CONTRAST    Standing Status:   Future    Standing Expiration Date:   11/16/2022    Order Specific Question:   Preferred imaging location?    Answer:   [facility]    Order Specific Question:   Release to patient    Answer:   Immediate    Order Specific Question:   Is Oral Contrast requested for this exam?    Answer:   Yes, Per Radiology protocol   All questions were answered. The patient knows to call the clinic with any problems, questions or concerns. No barriers to learning was detected. The total time spent in the appointment was 30 minutes.     Truitt Merle, MD 11/15/2021

## 2021-11-17 ENCOUNTER — Ambulatory Visit: Payer: Medicare Other | Admitting: Hematology

## 2021-11-17 ENCOUNTER — Other Ambulatory Visit: Payer: Medicare Other

## 2022-01-12 ENCOUNTER — Ambulatory Visit (HOSPITAL_COMMUNITY)
Admission: RE | Admit: 2022-01-12 | Discharge: 2022-01-12 | Disposition: A | Discharge status: Home / Self Care | Payer: Medicare Other | Source: Ambulatory Visit | Attending: Hematology | Admitting: Hematology

## 2022-01-12 ENCOUNTER — Inpatient Hospital Stay: Payer: Medicare Other | Attending: Nurse Practitioner

## 2022-01-12 DIAGNOSIS — Z171 Estrogen receptor negative status [ER-]: Secondary | ICD-10-CM | POA: Insufficient documentation

## 2022-01-12 DIAGNOSIS — Z95828 Presence of other vascular implants and grafts: Secondary | ICD-10-CM

## 2022-01-12 DIAGNOSIS — C50412 Malignant neoplasm of upper-outer quadrant of left female breast: Secondary | ICD-10-CM | POA: Insufficient documentation

## 2022-01-12 DIAGNOSIS — C773 Secondary and unspecified malignant neoplasm of axilla and upper limb lymph nodes: Secondary | ICD-10-CM | POA: Insufficient documentation

## 2022-01-12 DIAGNOSIS — C50812 Malignant neoplasm of overlapping sites of left female breast: Secondary | ICD-10-CM | POA: Insufficient documentation

## 2022-01-12 LAB — CMP (CANCER CENTER ONLY)
ALT: 16 U/L (ref 0–44)
AST: 34 U/L (ref 15–41)
Albumin: 4.1 g/dL (ref 3.5–5.0)
Alkaline Phosphatase: 61 U/L (ref 38–126)
Anion gap: 3 — ABNORMAL LOW (ref 5–15)
BUN: 17 mg/dL (ref 8–23)
CO2: 30 mmol/L (ref 22–32)
Calcium: 9.4 mg/dL (ref 8.9–10.3)
Chloride: 101 mmol/L (ref 98–111)
Creatinine: 0.59 mg/dL (ref 0.44–1.00)
GFR, Estimated: >60 mL/min (ref >60)
Glucose, Bld: 105 mg/dL — ABNORMAL HIGH (ref 70–99)
Potassium: 4.2 mmol/L (ref 3.5–5.1)
Sodium: 134 mmol/L — ABNORMAL LOW (ref 135–145)
Total Bilirubin: 0.5 mg/dL (ref 0.3–1.2)
Total Protein: 6.3 g/dL — ABNORMAL LOW (ref 6.5–8.1)

## 2022-01-12 LAB — CBC WITH DIFFERENTIAL (CANCER CENTER ONLY)
Abs Immature Granulocytes: 0.02 10*3/uL (ref 0.00–0.07)
Basophils Absolute: 0.1 10*3/uL (ref 0.0–0.1)
Basophils Relative: 1 %
Eosinophils Absolute: 0.1 10*3/uL (ref 0.0–0.5)
Eosinophils Relative: 2 %
HCT: 36.9 % (ref 36.0–46.0)
Hemoglobin: 13.1 g/dL (ref 12.0–15.0)
Immature Granulocytes: 0 %
Lymphocytes Relative: 30 %
Lymphs Abs: 1.8 10*3/uL (ref 0.7–4.0)
MCH: 32.6 pg (ref 26.0–34.0)
MCHC: 35.5 g/dL (ref 30.0–36.0)
MCV: 91.8 fL (ref 80.0–100.0)
Monocytes Absolute: 0.5 10*3/uL (ref 0.1–1.0)
Monocytes Relative: 8 %
Neutro Abs: 3.6 10*3/uL (ref 1.7–7.7)
Neutrophils Relative %: 59 %
Platelet Count: 163 10*3/uL (ref 150–400)
RBC: 4.02 MIL/uL (ref 3.87–5.11)
RDW: 12.7 % (ref 11.5–15.5)
WBC Count: 6 10*3/uL (ref 4.0–10.5)
nRBC: 0 % (ref 0.0–0.2)

## 2022-01-12 MED ORDER — IOHEXOL 9 MG/ML PO SOLN
1000.0000 mL | Freq: Once | ORAL | Status: AC
  Administered 2022-01-12: 1000 mL via ORAL

## 2022-01-12 MED ORDER — HEPARIN SOD (PORK) LOCK FLUSH 100 UNIT/ML IV SOLN
500.0000 [IU] | Freq: Once | INTRAVENOUS | Status: AC
  Administered 2022-01-12: 500 [IU] via INTRAVENOUS

## 2022-01-12 MED ORDER — IOHEXOL 300 MG/ML  SOLN
100.0000 mL | Freq: Once | INTRAMUSCULAR | Status: AC | PRN
Start: 2022-01-12 — End: 2022-01-12
  Administered 2022-01-12: 100 mL via INTRAVENOUS

## 2022-01-12 MED ORDER — HEPARIN SOD (PORK) LOCK FLUSH 100 UNIT/ML IV SOLN
INTRAVENOUS | Status: AC
  Filled 2022-01-12: qty 5

## 2022-01-12 MED ORDER — SODIUM CHLORIDE 0.9% FLUSH
10.0000 mL | Freq: Once | INTRAVENOUS | Status: AC
  Administered 2022-01-12: 10 mL

## 2022-01-16 ENCOUNTER — Encounter: Payer: Self-pay | Admitting: Hematology

## 2022-01-16 ENCOUNTER — Inpatient Hospital Stay (HOSPITAL_BASED_OUTPATIENT_CLINIC_OR_DEPARTMENT_OTHER): Payer: Medicare Other | Admitting: Hematology

## 2022-01-16 ENCOUNTER — Other Ambulatory Visit: Payer: Self-pay

## 2022-01-16 VITALS — BP 124/98 | HR 72 | Temp 98.2°F | Resp 18 | Ht 61.0 in | Wt 161.6 lb

## 2022-01-16 DIAGNOSIS — Z171 Estrogen receptor negative status [ER-]: Secondary | ICD-10-CM

## 2022-01-16 DIAGNOSIS — B182 Chronic viral hepatitis C: Secondary | ICD-10-CM

## 2022-01-16 DIAGNOSIS — C50812 Malignant neoplasm of overlapping sites of left female breast: Secondary | ICD-10-CM | POA: Diagnosis not present

## 2022-01-16 DIAGNOSIS — C50412 Malignant neoplasm of upper-outer quadrant of left female breast: Secondary | ICD-10-CM

## 2022-01-16 DIAGNOSIS — C773 Secondary and unspecified malignant neoplasm of axilla and upper limb lymph nodes: Secondary | ICD-10-CM | POA: Diagnosis not present

## 2022-01-16 NOTE — Progress Notes (Signed)
[place]   [phone] Fax:([phone]) [phone]   Clinic Follow up Note   Patient Care Team: Scotty Court, DO as PCP - General Mauro Kaufmann, RN as Oncology Nurse Navigator Rockwell Germany, RN as Oncology Nurse Navigator [name], MD as Consulting Physician (General Surgery) Truitt Merle, MD as Consulting Physician (Hematology) Eppie Gibson, MD as Attending Physician (Radiation Oncology) Alla Feeling, NP as Nurse Practitioner (Nurse Practitioner)  Date of Service:  01/16/2022  CHIEF COMPLAINT: f/u of left breast cancer  CURRENT THERAPY:  Surveillance  ASSESSMENT & PLAN:  Lindsay [name] is a 66 y.o. post-menopausal female with   1. Left breast Multifocal Metaplastic cancer, stage IIIC pT3N3aM0, including one unresectable axillary node, metaplastic carcinoma, triple negative, grade 3 -diagnosed in 10/2019. CT CAP and bone scan 11/2019 were negative for distant metastasis.  -S/p left mastectomy with Dr Marlou Starks on 12/26/19. Surgical path showed: 6cm multifocal metaplastic carcinoma; 13/16 positive LN, one of which was not able to be removed due to vascular invasion.  -s/p adjuvant carbo/taxol 02/05/20 - 05/13/20 followed by Adriamycin/cytoxan 05/20/20 - 07/13/20 along with Ballard Russell through 01/19/21.  She tolerated AC poorly with mucositis, weight loss, constipation, and weakness.  -interim left axillary [place] 06/17/20 is negative for adenopathy, c/w response to adjuvant chemo -s/p adjuvant radiation with Dr. Isidore Moos 08/24/20 - 10/05/20 -most recent right MM on 08/23/21 was negative. -restaging CT CAP 01/12/22 showed no definitive evidence of recurrent or metastatic disease. I reviewed the results with her today; she had many questions about several of the incidental findings. -from a breast cancer standpoint, she is doing well. Labs from 9/7 reviewed, overall stable. Physical exam unremarkable -she will keep her port for now. She will continue to return for flush  every 8 weeks. -she does have some changes from bronchitis and/or COVID. Plan to repeat a chest CT in 3 months to monitor.  2. B/L groundglass changes in her lungs -Seen on her recent CT scan from January 12, 2022, new since her last scan in August 2022. -She did have COVID in September 2022.  She has mild cough, otherwise asymptomatic -I reviewed the images with her, and recommend repeating CT chest in 3 months.   3. Hepatitis C, untreated, diagnosed in 1992 -LFTs overall sable  -I will refer to liver clinic NP Dawn Drazek    4. Covid-19+ 02/02/21 -she experienced body aches, fatigue, congestion, cough, and sore throat. -she was treated with azithromysin and paxlovid. 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     PLAN: -follow-up in 3 months with lab and CT chest a few days before. -referral to liver clinic    No problem-specific Assessment & Plan notes found for this encounter.   SUMMARY OF ONCOLOGIC HISTORY: Oncology History Overview Note  Cancer Staging Malignant neoplasm of upper-outer quadrant of left breast in female, estrogen receptor negative ([place]) Staging form: Breast, AJCC 8th Edition - Clinical stage from 10/28/2019: Stage IIIB (cT2, cN1, cM0, G3, ER-, PR-, HER2-) - Signed by Eppie Gibson, MD on 11/05/2019 - Pathologic stage from 12/26/2019: Stage IIIC (pT3, pN3a, cM0, G3, ER-, PR-, HER2-) - Signed by Truitt Merle, MD on 01/28/2020    Malignant neoplasm of upper-outer quadrant of left breast in female, estrogen receptor negative ([place])  10/27/2019 Mammogram   IMPRESSION: 1. Right breast calcifications spanning 2.6 cm in the upper slightly medial breast are indeterminate.   2. Left breast dominant mass at 3 o'clock,  6 cm from nipple measuring 3x3.9x2.9 cm is highly suspicious. There is overlying skin thickening, skin involvement is not excluded.   3. Left breast mass/distorted tissue at 1 o'clock, 4cm from nipple measuring  3.0x1.2x2.9 cm is likely in contiguity with the dominant mass at 3 o'clock and is also highly suspicious. With the dominant lesion the overall span a disease is approximately 6 cm.   4. Left breast distortion at 2 o'clock 10 cm from the nipple is Suspicious, measuring 1.0 x 0.8 cm.   5.  Abnormal left axillary lymph nodes (2). Cortical thickness measures up to 0.6 cm.    10/28/2019 Initial Biopsy   Diagnosis 1. Breast, left, needle core biopsy, 3 o'clock, 5cmfn - INVASIVE MAMMARY CARCINOMA. 2. Breast, left, needle core biopsy, 2 o'clock, 10cmfn - INVASIVE MAMMARY CARCINOMA. 3. 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 Microscopic Comment 1. -3. Overall, immunohistochemistry favors a pronounced desmoplastic response, but metaplastic carcinoma cannot be excluded (Epithelioid component: CKAE1AE3 strong +, CK5/6 weak +). The greatest linear extent of tumor in any one core in specimen 1 is 14 mm. The greatest linear extent of tumor in any one core in specimen 2 is 16 mm.   10/28/2019 Receptors her2   3. PROGNOSTIC INDICATORS Results: IMMUNOHISTOCHEMICAL AND MORPHOMETRIC ANALYSIS PERFORMED MANUALLY The tumor cells are EQUIVOCAL for Her2 (2+). Her2 by FISH will be performed and results reported separately. Estrogen Receptor: 0%, NEGATIVE Progesterone Receptor: 0%, NEGATIVE Proliferation Marker Ki67: 10%    3. FLUORESCENCE IN-SITU HYBRIDIZATION Results: GROUP 5: HER2 **NEGATIVE** Equivocal form of amplification of the HER2 gene was detected in the IHC 2+ tissue sample received from this individual. HER2 FISH was performed by a technologist and cell imaging and analysis on the [employer].   10/28/2019 Cancer Staging   Staging form: Breast, AJCC 8th Edition - Clinical stage from 10/28/2019: Stage IIIB (cT2, cN1, cM0, G3, ER-, PR-, HER2-) - Signed by Eppie Gibson, MD on 11/05/2019   10/31/2019 Initial Diagnosis   Malignant neoplasm of  upper-outer quadrant of left breast in female, estrogen receptor negative ([place])   10/31/2019 Pathology Results   Diagnosis Breast, right, needle core biopsy, UOQ, posterior - FOCAL USUAL DUCTAL HYPERPLASIA AND FIBROCYSTIC CHANGES WITH CALCIFICATIONS - FIBROADENOMATOID CHANGES - NO MALIGNANCY IDENTIFIED Microscopic Comment These results were called to [facility] on November 03, 2019.   11/05/2019 Genetic Testing   She declined Genetic testing    11/21/2019 Imaging   CT CAP w contrast  IMPRESSION: 1. Left breast mass and prominent left axillary lymph nodes, containing biopsy marking clips, consistent with newly diagnosed breast malignancy. 2. No definite evidence of distant metastatic disease in the chest, abdomen, or pelvis. 3. There are occasional small pulmonary nodules, measuring 2-3 mm, most likely incidental sequelae of prior infection or inflammation although metastatic disease is not excluded. Attention on follow-up. 4. Hepatic steatosis. 5. Aortic Atherosclerosis (ICD10-I70.0).     11/21/2019 Imaging   Bone Scan Whole Body IMPRESSION: 1. Single focus of uptake associated with a subacute fracture of LEFT anterior fourth rib. 2. No additional areas of abnormal uptake.   12/26/2019 Cancer Staging   Staging form: Breast, AJCC 8th Edition - Pathologic stage from 12/26/2019: Stage IIIC (pT3, pN3a, cM0, G3, ER-, PR-, HER2-) - Signed by Truitt Merle, MD on 01/28/2020   12/26/2019 Surgery   LEFT MASTECTOMY MODIFIED RADICAL and PAC Placement by Dr Marlou Starks   12/26/2019 Pathology Results   FINAL MICROSCOPIC DIAGNOSIS:  A. 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  - Ductal carcinoma in situ, high nuclear grade with central necrosis.  - Margins of resection:  - Metaplastic carcinoma focally involves the anterior margin and is < 1  mm from the posterior margin.  - DCIS is < 1  mm from the anterior margin.  - Metastatic carcinoma in (13) of (16) lymph nodes with extranodal  extension.  - Biopsy clip sites in breast and one lymph node.  - See oncology table.    ADDENDUM:  PROGNOSTIC INDICATOR RESULTS:  Immunohistochemical and morphometric analysis performed manually  The tumor cells are EQUIVOCAL for Her2 (2+). Her2 FISH has been ordered  and will be reported in an addendum.  Estrogen Receptor:       NEGATIVE  Progesterone Receptor:   NEGATIVE  Proliferation Marker Ki-67:   30%    ADDENDUM:  FLOURESCENCE IN-SITU HYBRIDIZATION RESULTS:  GROUP 5:   HER2 NEGATIVE   01/28/2020 Echocardiogram   Baseline Echo  IMPRESSIONS     1. Left ventricular ejection fraction, by estimation, is 60 to 65%. The  left ventricle has normal function. The left ventricle has no regional  wall motion abnormalities. Left ventricular diastolic parameters are  consistent with Grade I diastolic  dysfunction (impaired relaxation). The average left ventricular global  longitudinal strain is -19.8 %.   2. Right ventricular systolic function is normal. The right ventricular  size is normal. Tricuspid regurgitation signal is inadequate for assessing  PA pressure.   3. The mitral valve is normal in structure. No evidence of mitral valve  regurgitation. No evidence of mitral stenosis.   4. The aortic valve is normal in structure. Aortic valve regurgitation is  not visualized. No aortic stenosis is present.   5. The inferior vena cava is normal in size with greater than 50%  respiratory variability, suggesting right atrial pressure of 3 mmHg.    02/05/2020 - 01/19/2021 Chemotherapy   Keytruda q3weeks (to be taken for 1 whole year) with weekly Carboplatin/Taxol for 12 weeks starting 02/05/20-05/13/20 followed by Adriamycin/Cytoxan q2weeks X4 starting 05/20/20-07/13/20.  ----Continue Keytruda q3weeks from 08/03/20 to complete 1 year of treatment from 02/05/20).    06/17/2020 Breast US    FINDINGS: On physical exam,well-healed scars of LEFT mastectomy and LEFT axillary node dissection. I palpate no axillary mass. Ultrasound is performed, showing normal appearing LEFT axillary contents. No mass or enlarged lymph nodes. IMPRESSION: Ultrasound is negative for LEFT axillary adenopathy.   08/24/2020 - 10/05/2020 Radiation Therapy   Adjuvant RT per Dr. Isidore Moos starting 08/24/20-10/05/20   04/14/2021 Survivorship   SCP delivered by Cira Rue, NP      INTERVAL HISTORY:  Sanda Linger is here for a follow up of breast cancer. She was last seen by me on 11/15/21. She presents to the clinic alone. She reports she is doing well, aside from a list of questions about her recent scan.   All other systems were reviewed with the patient and are negative.  MEDICAL HISTORY:  Past Medical History:  Diagnosis Date   Bronchitis    Family history of bone cancer    Family history of breast cancer    Family history of prostate cancer    Fibromyalgia    HCV (hepatitis C virus)    MRSA (methicillin resistant Staphylococcus aureus) 2012    SURGICAL HISTORY: Past Surgical History:  Procedure Laterality Date   MASTECTOMY MODIFIED  RADICAL Left 12/26/2019   Procedure: LEFT MASTECTOMY MODIFIED RADICAL;  Surgeon: [name], MD;  Location: [facility];  Service: General;  Laterality: Left;  PEC BLOCK   PORTACATH PLACEMENT Right 12/26/2019   Procedure: INSERTION PORT-A-CATH WITH ULTRASOUND GUIDANCE;  Surgeon: [name], MD;  Location: [facility];  Service: General;  Laterality: Right;    I have reviewed the social history and family history with the patient and they are unchanged from previous note.  ALLERGIES:  is allergic to codeine.  MEDICATIONS:  Current Outpatient Medications  Medication Sig Dispense Refill   acetaminophen (TYLENOL) 650 MG CR tablet Take 650 mg by mouth every 8 (eight) hours as needed for pain.      ascorbic acid (VITAMIN C) 250 MG CHEW Chew by mouth.      dexamethasone (DECADRON) 0.5 MG/5ML solution Take 10 ml (1 mg) swish for 2 minutes and then spit.  Do this four times daily as needed Do Not eat or drink for 1 hour after. 240 mL 0   diphenoxylate-atropine (LOMOTIL) 2.5-0.025 MG tablet Take 1-2 tablets by mouth 4 (four) times daily as needed for diarrhea or loose stools. 30 tablet 1   doxycycline (VIBRA-TABS) 100 MG tablet Take 1 tablet (100 mg total) by mouth 2 (two) times daily. 14 tablet 0   gabapentin (NEURONTIN) 300 MG capsule Take 300 mg by mouth 3 (three) times daily as needed.     lidocaine (XYLOCAINE) 2 % solution Use as directed 15 mLs in the mouth or throat every 4 (four) hours as needed for mouth pain. 100 mL 2   lidocaine-prilocaine (EMLA) cream Apply 1 application. topically as needed. 30 g 2   methocarbamol (ROBAXIN) 750 MG tablet Take 1 tablet (750 mg total) by mouth 4 (four) times daily as needed (use for muscle cramps/pain). 30 tablet 2   mucosal barrier oral (GELCLAIR) GEL Take 1 packet by mouth as needed.     omeprazole (PRILOSEC) 20 MG capsule Take 1 capsule (20 mg total) by mouth daily. 30 capsule 2   valACYclovir (VALTREX) 1000 MG tablet Take 1 tablet (1,000 mg total) by mouth 2 (two) times daily. 14 tablet 1   No current facility-administered medications for this visit.    PHYSICAL EXAMINATION: ECOG PERFORMANCE STATUS: 1 - Symptomatic but completely ambulatory  Vitals:   01/16/22 0904  BP: (!) 124/98  Pulse: 72  Resp: 18  Temp: 98.2 F (36.8 C)  SpO2: 100%   Wt Readings from Last 3 Encounters:  01/16/22 161 lb 9.6 oz (73.3 kg)  11/15/21 157 lb (71.2 kg)  07/12/21 148 lb 3.2 oz (67.2 kg)     GENERAL:alert, no distress and comfortable SKIN: skin color, texture, turgor are normal, no rashes or significant lesions EYES: normal, Conjunctiva are pink and non-injected, sclera clear  NECK: supple, thyroid normal size, non-tender, without nodularity LYMPH:  no palpable lymphadenopathy in the cervical,  axillary LUNGS: clear to auscultation and percussion with normal breathing effort HEART: regular rate & rhythm and no murmurs and no lower extremity edema ABDOMEN:abdomen soft, non-tender and normal bowel sounds Musculoskeletal:no cyanosis of digits and no clubbing  NEURO: alert & oriented x 3 with fluent speech, no focal motor/sensory deficits BREAST: No palpable mass, nodules or adenopathy bilaterally. Breast exam benign.   LABORATORY DATA:  I have reviewed the data as listed    Latest Ref Rng & Units 01/12/2022    2:34 PM 11/15/2021   11:53 AM 10/06/2021   11:50 AM  CBC  WBC 4.0 - 10.5 K/uL 6.0  3.7  4.4   Hemoglobin 12.0 - 15.0 g/dL 13.1  12.8  12.8   Hematocrit 36.0 - 46.0 % 36.9  37.7  37.5   Platelets 150 - 400 K/uL 163  148  125         Latest Ref Rng & Units 01/12/2022    2:34 PM 11/15/2021   11:53 AM 10/06/2021   11:50 AM  CMP  Glucose 70 - 99 mg/dL 105  97  107   BUN 8 - 23 mg/dL '17  17  22   ' Creatinine 0.44 - 1.00 mg/dL 0.59  0.70  0.70   Sodium 135 - 145 mmol/L 134  137  136   Potassium 3.5 - 5.1 mmol/L 4.2  4.4  4.1   Chloride 98 - 111 mmol/L 101  106  104   CO2 22 - 32 mmol/L '30  29  28   ' Calcium 8.9 - 10.3 mg/dL 9.4  9.1  9.2   Total Protein 6.5 - 8.1 g/dL 6.3  5.9  5.9   Total Bilirubin 0.3 - 1.2 mg/dL 0.5  0.3  0.4   Alkaline Phos 38 - 126 U/L 61  50  55   AST 15 - 41 U/L 34  31  38   ALT 0 - 44 U/L '16  17  21       ' RADIOGRAPHIC STUDIES: I have personally reviewed the radiological images as listed and agreed with the findings in the report. No results found.    Orders Placed This Encounter  Procedures   CT Chest Wo Contrast    Standing Status:   Future    Standing Expiration Date:   01/16/2023    Order Specific Question:   Preferred imaging location?    Answer:   [facility]   Amb Referral to Hepatology    Referral Priority:   Routine    Referral Type:   Consultation    Number of Visits Requested:   1   All questions were answered. The  patient knows to call the clinic with any problems, questions or concerns. No barriers to learning was detected. The total time spent in the appointment was 30 minutes.     Truitt Merle, MD 01/16/2022   I, Wilburn Mylar, am acting as scribe for Truitt Merle, MD.   I have reviewed the above documentation for accuracy and completeness, and I agree with the above.

## 2022-03-02 ENCOUNTER — Inpatient Hospital Stay: Payer: Medicare Other | Attending: Nurse Practitioner

## 2022-03-02 DIAGNOSIS — C50412 Malignant neoplasm of upper-outer quadrant of left female breast: Secondary | ICD-10-CM | POA: Insufficient documentation

## 2022-03-02 LAB — CBC WITH DIFFERENTIAL (CANCER CENTER ONLY)
Abs Immature Granulocytes: 0.01 10*3/uL (ref 0.00–0.07)
Basophils Absolute: 0.1 10*3/uL (ref 0.0–0.1)
Basophils Relative: 1 %
Eosinophils Absolute: 0.3 10*3/uL (ref 0.0–0.5)
Eosinophils Relative: 5 %
HCT: 37.3 % (ref 36.0–46.0)
Hemoglobin: 12.9 g/dL (ref 12.0–15.0)
Immature Granulocytes: 0 %
Lymphocytes Relative: 30 %
Lymphs Abs: 1.9 10*3/uL (ref 0.7–4.0)
MCH: 32 pg (ref 26.0–34.0)
MCHC: 34.6 g/dL (ref 30.0–36.0)
MCV: 92.6 fL (ref 80.0–100.0)
Monocytes Absolute: 0.5 10*3/uL (ref 0.1–1.0)
Monocytes Relative: 9 %
Neutro Abs: 3.5 10*3/uL (ref 1.7–7.7)
Neutrophils Relative %: 55 %
Platelet Count: 162 10*3/uL (ref 150–400)
RBC: 4.03 MIL/uL (ref 3.87–5.11)
RDW: 12.8 % (ref 11.5–15.5)
WBC Count: 6.4 10*3/uL (ref 4.0–10.5)
nRBC: 0 % (ref 0.0–0.2)

## 2022-03-02 LAB — CMP (CANCER CENTER ONLY)
ALT: 16 U/L (ref 0–44)
AST: 27 U/L (ref 15–41)
Albumin: 4 g/dL (ref 3.5–5.0)
Alkaline Phosphatase: 62 U/L (ref 38–126)
Anion gap: 5 (ref 5–15)
BUN: 23 mg/dL (ref 8–23)
CO2: 27 mmol/L (ref 22–32)
Calcium: 8.9 mg/dL (ref 8.9–10.3)
Chloride: 101 mmol/L (ref 98–111)
Creatinine: 0.75 mg/dL (ref 0.44–1.00)
GFR, Estimated: >60 mL/min (ref >60)
Glucose, Bld: 100 mg/dL — ABNORMAL HIGH (ref 70–99)
Potassium: 4.1 mmol/L (ref 3.5–5.1)
Sodium: 133 mmol/L — ABNORMAL LOW (ref 135–145)
Total Bilirubin: 0.4 mg/dL (ref 0.3–1.2)
Total Protein: 6.1 g/dL — ABNORMAL LOW (ref 6.5–8.1)

## 2022-03-20 IMAGING — NM NM BONE WHOLE BODY
2 series · 2 of 2 positions shown · non-contrast
Comparison: CT chest, abdomen and pelvis of November 21, 2019

CLINICAL DATA: Breast cancer staging

EXAM:
NUCLEAR MEDICINE WHOLE BODY BONE SCAN
TECHNIQUE: Whole body anterior and posterior images were obtained approximately
3 hours after intravenous injection of radiopharmaceutical.
RADIOPHARMACEUTICALS:  Twenty-two mCi 0echnetium-VVm MDP IV

[Series 1: whole body · 2.66mm/px · 1 of 1 slices shown (1 of 2)]
[im 1/1]
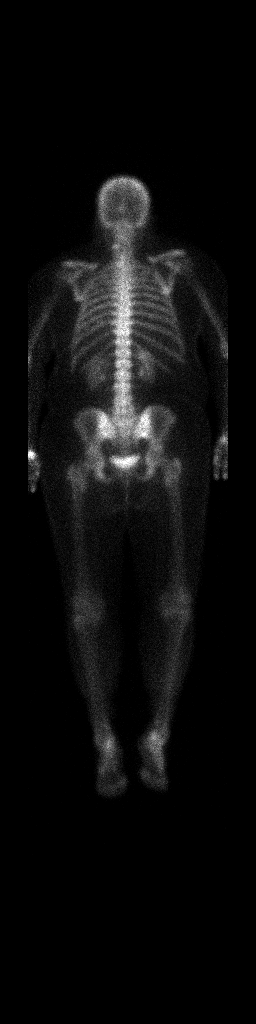

[Series 1: whole body · 2.66mm/px · 1 of 1 slices shown (2 of 2)]
[im 1/1]
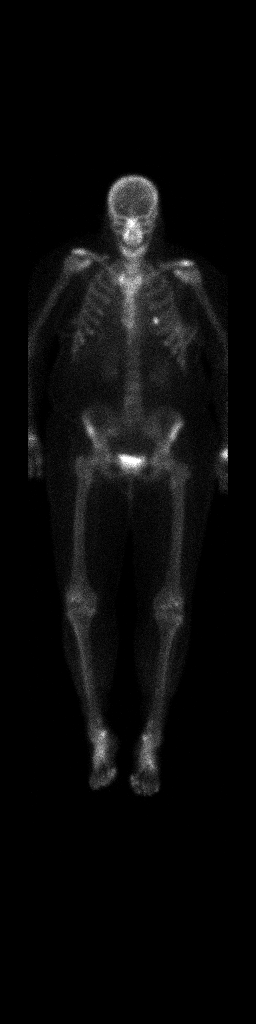

[2 of 2 positions shown; findings below may reference images not displayed]

FINDINGS: Single focal area of increased uptake and LEFT-sided rib, fourth rib
anteriorly corresponding to a subacute fracture in this location.

No additional foci of abnormal uptake.

Expected excretion and activity associated with the urinary tract.
IMPRESSION: 1. Single focus of uptake associated with a subacute fracture of
LEFT anterior fourth rib.
2. No additional areas of abnormal uptake.

## 2022-03-20 IMAGING — CT CT ABD-PELV W/ CM
3 of 5 series · 14 of 36 positions shown, 17 images · IV contrast (OMNIPAQUE 300)
Comparison: None.

CLINICAL DATA: New diagnosis left breast cancer, staging

EXAM:
CT CHEST, ABDOMEN, AND PELVIS WITH CONTRAST
TECHNIQUE: Multidetector CT imaging of the chest, abdomen and pelvis was
performed following the standard protocol during bolus
administration of intravenous contrast.
CONTRAST:  100mL OMNIPAQUE IOHEXOL 300 MG/ML SOLN, additional oral
enteric contrast

[Series 2: cap with · axial · 0.82mm/px · z∈[+1124,+1629]mm · 9 of 127 slices shown, 12 images]
[im 13/127  mediastinal]
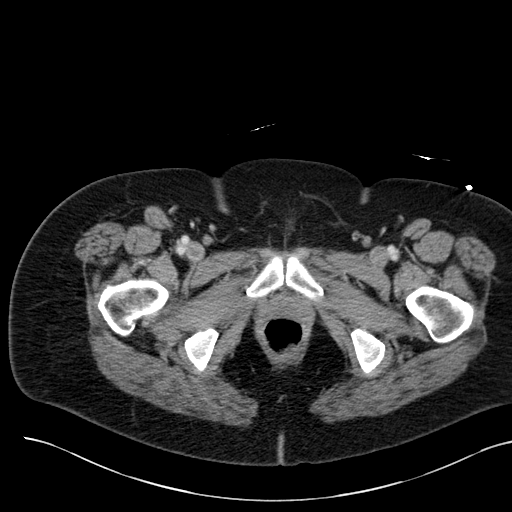
[im 13/127  lung]
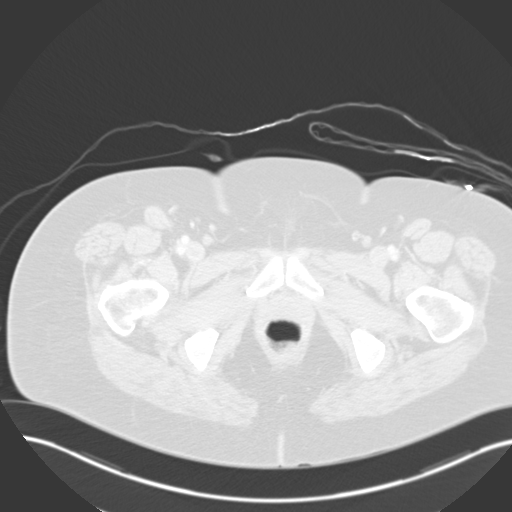
[im 26/127  lung]
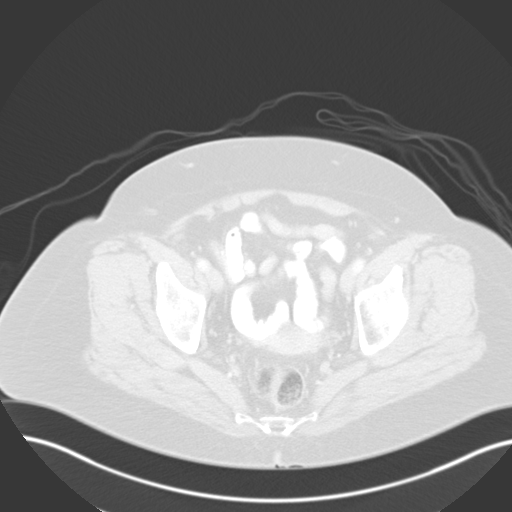
[im 38/127  lung]
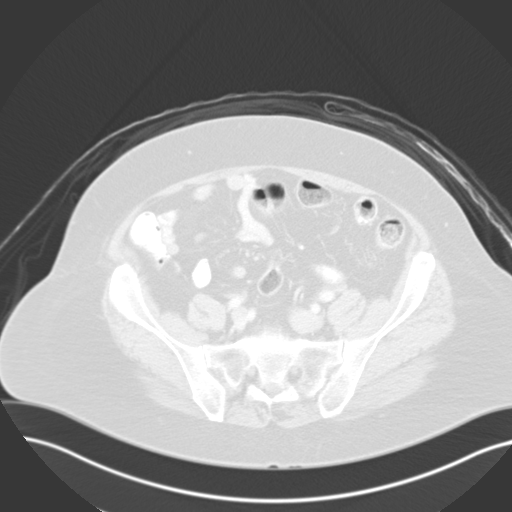
[im 51/127  lung]
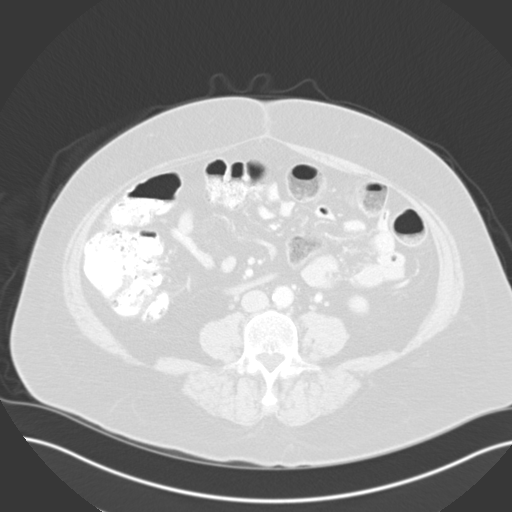
[im 64/127  mediastinal]
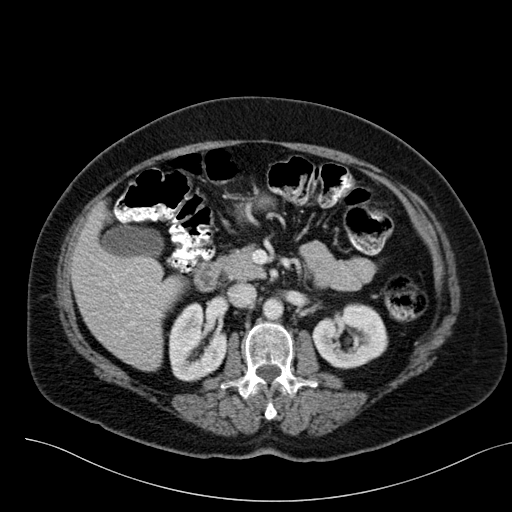
[im 64/127  lung]
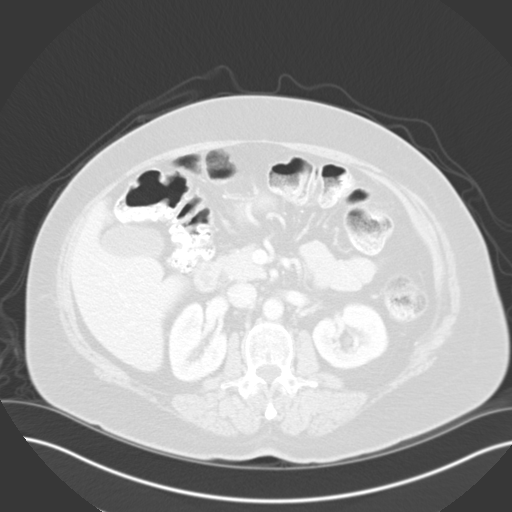
[im 76/127  lung]
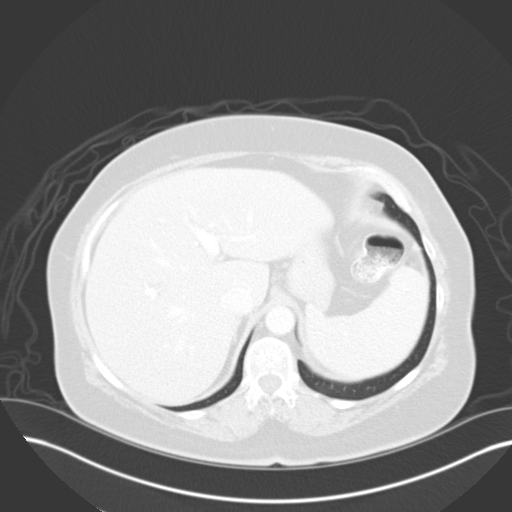
[im 89/127  lung]
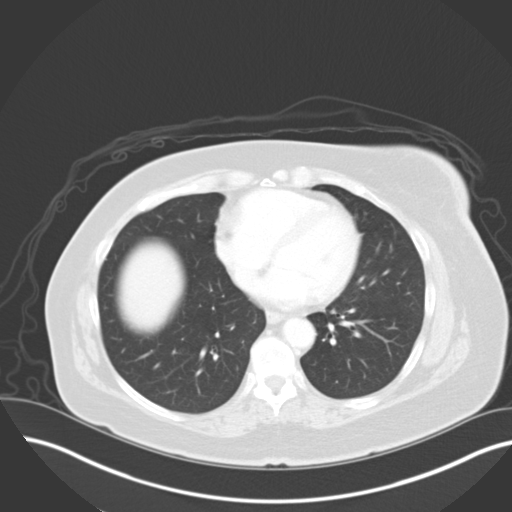
[im 101/127  lung]
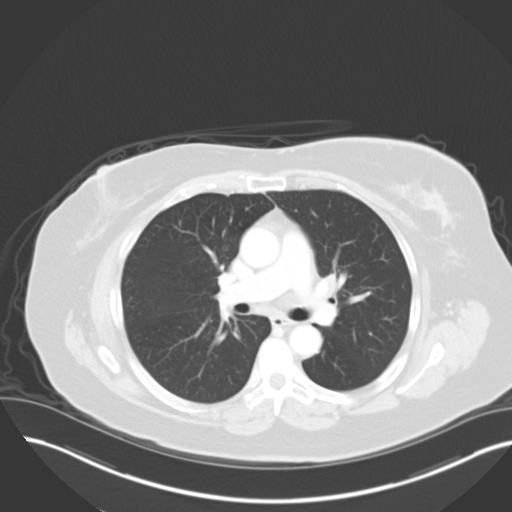
[im 114/127  mediastinal]
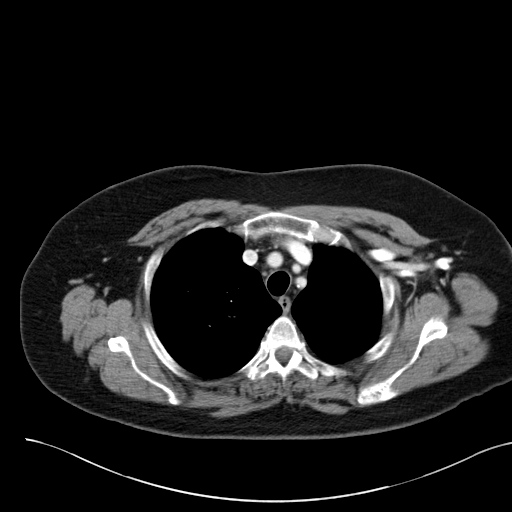
[im 114/127  lung]
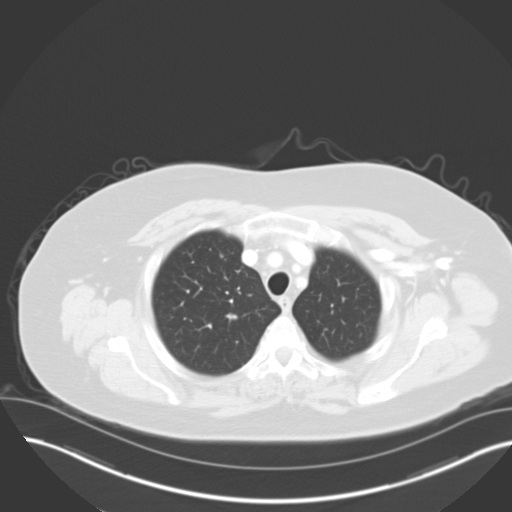

[Series 3: lung · axial · 0.82mm/px · z∈[+1396,+1446]mm · 2 of 162 slices shown]
[im 13/162  lung]
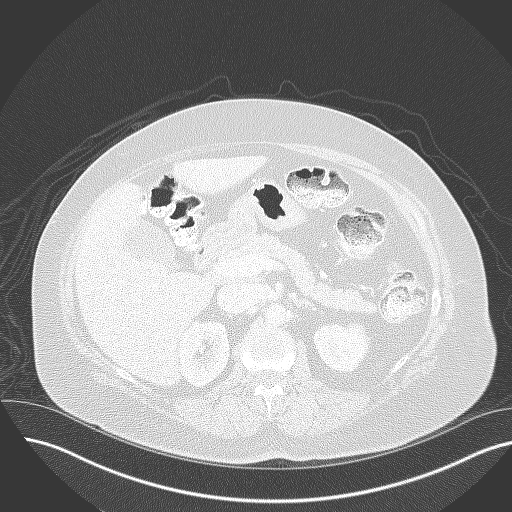
[im 38/162  lung]
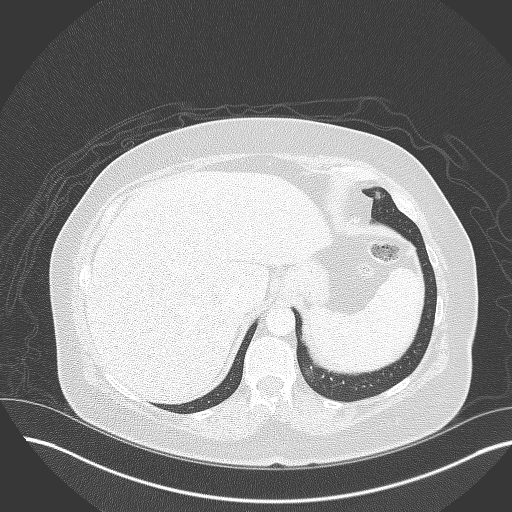

[Series 4: coronals · coronal · 0.95mm/px · 3 of 160 slices shown]
[im 32/160  lung]
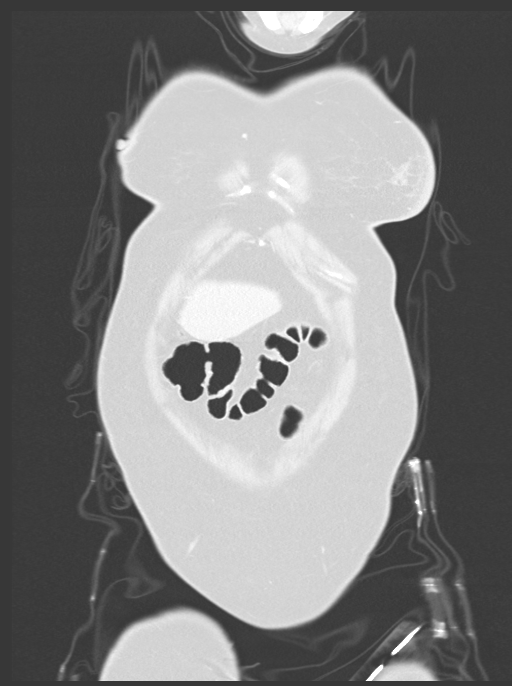
[im 64/160  lung]
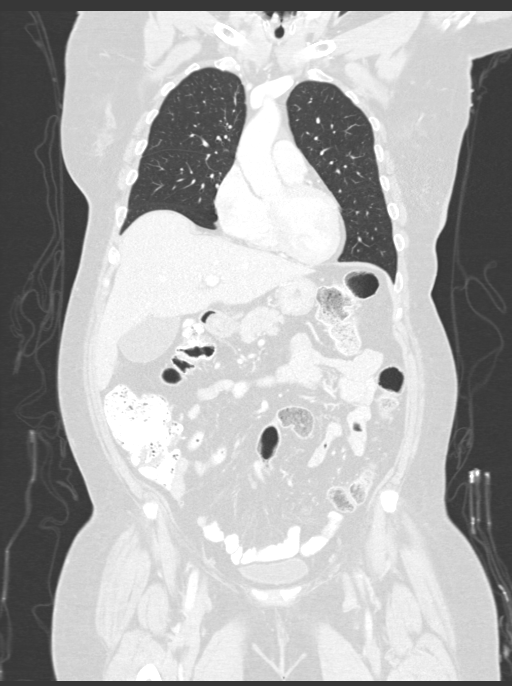
[im 96/160  lung]
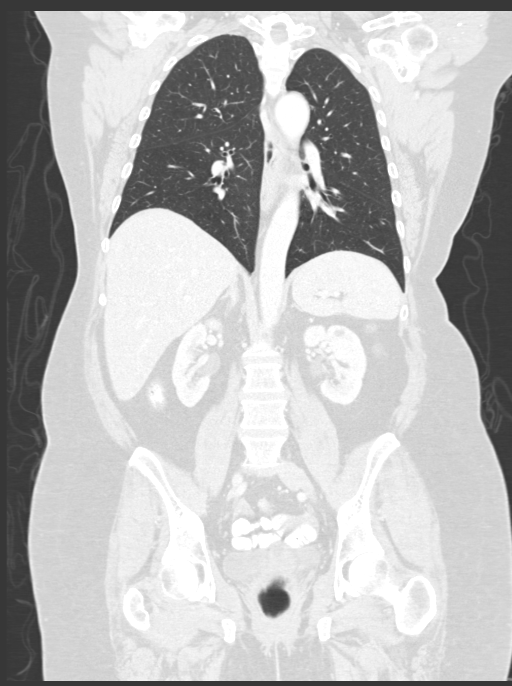

[14 of 36 positions shown; findings below may reference images not displayed]

FINDINGS: CT CHEST FINDINGS

Cardiovascular: Scattered aortic atherosclerosis. Normal heart
size. No pericardial effusion.

Mediastinum/Nodes: No enlarged mediastinal, hilar, or axillary lymph
nodes. Thyroid gland, trachea, and esophagus demonstrate no
significant findings.

Lungs/Pleura: There are occasional small pulmonary nodules, for
example a 3 mm nodule of the medial right apex (series 3, image 30),
a 3 mm fissural nodule of the right middle lobe (series 3, image
73), and a 2 mm nodule of the posterior left upper lobe (series 3,
image 46). There are additional small, definitively benign calcified
pulmonary nodules. No pleural effusion or pneumothorax.

Musculoskeletal: No chest wall mass or suspicious bone lesions
identified. Left breast mass containing a biopsy marking clip.
Prominent left axillary lymph nodes, containing biopsy marking
clips.

CT ABDOMEN PELVIS FINDINGS

Hepatobiliary: No solid liver abnormality is seen. Hepatic
steatosis. No gallstones, gallbladder wall thickening, or biliary
dilatation.

Pancreas: Unremarkable. No pancreatic ductal dilatation or
surrounding inflammatory changes.

Spleen: Normal in size without significant abnormality.

Adrenals/Urinary Tract: Adrenal glands are unremarkable. Kidneys are
normal, without renal calculi, solid lesion, or hydronephrosis.
Bladder is unremarkable.

Stomach/Bowel: Stomach is within normal limits. Appendix appears
normal. No evidence of bowel wall thickening, distention, or
inflammatory changes.

Vascular/Lymphatic: Aortic atherosclerosis. No enlarged abdominal or
pelvic lymph nodes.

Reproductive: No mass or other abnormality. Nabothian cysts of the
cervix.

Other: No abdominal wall hernia or abnormality. No abdominopelvic
ascites.

Musculoskeletal: No acute or significant osseous findings.
IMPRESSION: 1. Left breast mass and prominent left axillary lymph nodes,
containing biopsy marking clips, consistent with newly diagnosed
breast malignancy.
2. No definite evidence of distant metastatic disease in the chest,
abdomen, or pelvis.
3. There are occasional small pulmonary nodules, measuring 2-3 mm,
most likely incidental sequelae of prior infection or inflammation
although metastatic disease is not excluded. Attention on follow-up.
4. Hepatic steatosis.
5. Aortic Atherosclerosis (MYJLU-617.7).

## 2022-04-03 ENCOUNTER — Telehealth: Payer: Self-pay | Admitting: Hematology

## 2022-04-05 ENCOUNTER — Telehealth: Payer: Self-pay | Admitting: Medical Oncology

## 2022-04-05 NOTE — Telephone Encounter (Signed)
Pt wants all her appts on jan 18th .Schedule message sent.

## 2022-04-11 ENCOUNTER — Other Ambulatory Visit: Payer: Medicare Other

## 2022-04-13 ENCOUNTER — Other Ambulatory Visit: Payer: Medicare Other

## 2022-04-13 ENCOUNTER — Inpatient Hospital Stay: Payer: Medicare Other | Attending: Nurse Practitioner

## 2022-04-13 ENCOUNTER — Ambulatory Visit: Payer: Medicare Other | Admitting: Hematology

## 2022-04-13 DIAGNOSIS — Z95828 Presence of other vascular implants and grafts: Secondary | ICD-10-CM

## 2022-04-13 DIAGNOSIS — Z452 Encounter for adjustment and management of vascular access device: Secondary | ICD-10-CM | POA: Insufficient documentation

## 2022-04-13 DIAGNOSIS — C50412 Malignant neoplasm of upper-outer quadrant of left female breast: Secondary | ICD-10-CM | POA: Insufficient documentation

## 2022-04-13 MED ORDER — HEPARIN SOD (PORK) LOCK FLUSH 100 UNIT/ML IV SOLN
500.0000 [IU] | Freq: Once | INTRAVENOUS | Status: AC
  Administered 2022-04-13: 500 [IU]

## 2022-04-13 MED ORDER — SODIUM CHLORIDE 0.9% FLUSH
10.0000 mL | Freq: Once | INTRAVENOUS | Status: AC
  Administered 2022-04-13: 10 mL

## 2022-05-18 ENCOUNTER — Encounter: Payer: Self-pay | Admitting: Hematology

## 2022-05-23 ENCOUNTER — Other Ambulatory Visit: Payer: Self-pay

## 2022-05-23 MED ORDER — LIDOCAINE-PRILOCAINE 2.5-2.5 % EX CREA: 1.0000 | TOPICAL_CREAM | CUTANEOUS | 2 refills | Status: DC | PRN

## 2022-05-24 ENCOUNTER — Encounter: Payer: Self-pay | Admitting: Hematology

## 2022-05-25 ENCOUNTER — Other Ambulatory Visit: Payer: Self-pay

## 2022-05-25 ENCOUNTER — Inpatient Hospital Stay (HOSPITAL_BASED_OUTPATIENT_CLINIC_OR_DEPARTMENT_OTHER): Payer: Medicare Other | Admitting: Hematology

## 2022-05-25 ENCOUNTER — Ambulatory Visit (HOSPITAL_COMMUNITY)
Admission: RE | Admit: 2022-05-25 | Discharge: 2022-05-25 | Disposition: A | Discharge status: Home / Self Care | Payer: Medicare Other | Source: Ambulatory Visit | Attending: Hematology | Admitting: Hematology

## 2022-05-25 ENCOUNTER — Inpatient Hospital Stay: Payer: Medicare Other | Attending: Nurse Practitioner

## 2022-05-25 VITALS — BP 119/67 | HR 86 | Temp 98.1°F | Resp 19 | Ht 61.0 in | Wt 168.9 lb

## 2022-05-25 DIAGNOSIS — Z923 Personal history of irradiation: Secondary | ICD-10-CM | POA: Diagnosis not present

## 2022-05-25 DIAGNOSIS — Z1231 Encounter for screening mammogram for malignant neoplasm of breast: Secondary | ICD-10-CM

## 2022-05-25 DIAGNOSIS — C50412 Malignant neoplasm of upper-outer quadrant of left female breast: Secondary | ICD-10-CM

## 2022-05-25 DIAGNOSIS — R609 Edema, unspecified: Secondary | ICD-10-CM | POA: Diagnosis not present

## 2022-05-25 DIAGNOSIS — R600 Localized edema: Secondary | ICD-10-CM | POA: Diagnosis not present

## 2022-05-25 DIAGNOSIS — Z9012 Acquired absence of left breast and nipple: Secondary | ICD-10-CM | POA: Insufficient documentation

## 2022-05-25 DIAGNOSIS — Z171 Estrogen receptor negative status [ER-]: Secondary | ICD-10-CM

## 2022-05-25 DIAGNOSIS — Z9221 Personal history of antineoplastic chemotherapy: Secondary | ICD-10-CM | POA: Insufficient documentation

## 2022-05-25 DIAGNOSIS — E2839 Other primary ovarian failure: Secondary | ICD-10-CM | POA: Diagnosis not present

## 2022-05-25 DIAGNOSIS — C773 Secondary and unspecified malignant neoplasm of axilla and upper limb lymph nodes: Secondary | ICD-10-CM | POA: Diagnosis not present

## 2022-05-25 DIAGNOSIS — Z95828 Presence of other vascular implants and grafts: Secondary | ICD-10-CM

## 2022-05-25 LAB — CBC WITH DIFFERENTIAL (CANCER CENTER ONLY)
Abs Immature Granulocytes: 0.02 10*3/uL (ref 0.00–0.07)
Basophils Absolute: 0.1 10*3/uL (ref 0.0–0.1)
Basophils Relative: 1 %
Eosinophils Absolute: 0.2 10*3/uL (ref 0.0–0.5)
Eosinophils Relative: 4 %
HCT: 36.9 % (ref 36.0–46.0)
Hemoglobin: 12.8 g/dL (ref 12.0–15.0)
Immature Granulocytes: 0 %
Lymphocytes Relative: 30 %
Lymphs Abs: 1.8 10*3/uL (ref 0.7–4.0)
MCH: 32.2 pg (ref 26.0–34.0)
MCHC: 34.7 g/dL (ref 30.0–36.0)
MCV: 92.7 fL (ref 80.0–100.0)
Monocytes Absolute: 0.5 10*3/uL (ref 0.1–1.0)
Monocytes Relative: 9 %
Neutro Abs: 3.2 10*3/uL (ref 1.7–7.7)
Neutrophils Relative %: 56 %
Platelet Count: 151 10*3/uL (ref 150–400)
RBC: 3.98 MIL/uL (ref 3.87–5.11)
RDW: 12.8 % (ref 11.5–15.5)
WBC Count: 5.8 10*3/uL (ref 4.0–10.5)
nRBC: 0 % (ref 0.0–0.2)

## 2022-05-25 LAB — CMP (CANCER CENTER ONLY)
ALT: 16 U/L (ref 0–44)
AST: 33 U/L (ref 15–41)
Albumin: 3.7 g/dL (ref 3.5–5.0)
Alkaline Phosphatase: 61 U/L (ref 38–126)
Anion gap: 4 — ABNORMAL LOW (ref 5–15)
BUN: 15 mg/dL (ref 8–23)
CO2: 29 mmol/L (ref 22–32)
Calcium: 9.1 mg/dL (ref 8.9–10.3)
Chloride: 103 mmol/L (ref 98–111)
Creatinine: 0.62 mg/dL (ref 0.44–1.00)
GFR, Estimated: >60 mL/min (ref >60)
Glucose, Bld: 109 mg/dL — ABNORMAL HIGH (ref 70–99)
Potassium: 3.9 mmol/L (ref 3.5–5.1)
Sodium: 136 mmol/L (ref 135–145)
Total Bilirubin: 0.3 mg/dL (ref 0.3–1.2)
Total Protein: 5.6 g/dL — ABNORMAL LOW (ref 6.5–8.1)

## 2022-05-25 MED ORDER — HEPARIN SOD (PORK) LOCK FLUSH 100 UNIT/ML IV SOLN
INTRAVENOUS | Status: AC
  Administered 2022-05-25: 500 [IU]
  Filled 2022-05-25: qty 5

## 2022-05-25 MED ORDER — HEPARIN SOD (PORK) LOCK FLUSH 100 UNIT/ML IV SOLN
500.0000 [IU] | Freq: Once | INTRAVENOUS | Status: AC
  Administered 2022-05-25: 500 [IU]

## 2022-05-25 MED ORDER — SODIUM CHLORIDE 0.9% FLUSH
10.0000 mL | Freq: Once | INTRAVENOUS | Status: AC
  Administered 2022-05-25: 10 mL

## 2022-05-25 MED ORDER — IOHEXOL 300 MG/ML  SOLN
75.0000 mL | Freq: Once | INTRAMUSCULAR | Status: AC | PRN
  Administered 2022-05-25: 75 mL via INTRAVENOUS

## 2022-05-25 MED ORDER — HEPARIN SOD (PORK) LOCK FLUSH 100 UNIT/ML IV SOLN: 500.0000 [IU] | Freq: Once | INTRAVENOUS | Status: DC

## 2022-05-25 NOTE — Assessment & Plan Note (Signed)
Left breast Multifocal Metaplastic cancer, stage IIIC pT3N3aM0, including one unresectable axillary node, metaplastic carcinoma, triple negative, grade 3 -diagnosed in 10/2019. CT CAP and bone scan 11/2019 were negative for distant metastasis.  -S/p left mastectomy with Dr Marlou Starks on 12/26/19. Surgical path showed: 6cm multifocal metaplastic carcinoma; 13/16 positive LN, one of which was not able to be removed due to vascular invasion.  -s/p adjuvant carbo/taxol 02/05/20 - 05/13/20 followed by Adriamycin/cytoxan 05/20/20 - 07/13/20 along with Ballard Russell through 01/19/21.  She tolerated AC poorly with mucositis, weight loss, constipation, and weakness.  -interim left axillary [place] 06/17/20 is negative for adenopathy, c/w response to adjuvant chemo -s/p adjuvant radiation with Dr. Isidore Moos 08/24/20 - 10/05/20 -most recent right MM on 08/23/21 was negative. -restaging CT CAP 01/12/22 showed no definitive evidence of recurrent or metastatic disease.  -she will keep her port for now. She will continue to return for flush every 8 weeks.

## 2022-05-25 NOTE — Addendum Note (Signed)
Addended by: Azzie Almas on: 05/25/2022 12:25 PM   Modules accepted: Orders

## 2022-05-25 NOTE — Progress Notes (Signed)
[facility]   [phone] Fax:([phone]) [phone]   Clinic Follow up Note   Patient Care Team: Scotty Court, DO as PCP - General Mauro Kaufmann, RN as Oncology Nurse Navigator Rockwell Germany, RN as Oncology Nurse Navigator [name], MD as Consulting Physician (General Surgery) Truitt Merle, MD as Consulting Physician (Hematology) Eppie Gibson, MD as Attending Physician (Radiation Oncology) Alla Feeling, NP as Nurse Practitioner (Nurse Practitioner)  Date of Service:  05/25/2022  CHIEF COMPLAINT: f/u of left breast cancer   CURRENT THERAPY: Surveillance   ASSESSMENT:  Lindsay [name] is a 67 y.o. female with   Malignant neoplasm of upper-outer quadrant of left breast in female, estrogen receptor negative ([place])  Left breast Multifocal Metaplastic cancer, stage IIIC pT3N3aM0, including one unresectable axillary node, metaplastic carcinoma, triple negative, grade 3 -diagnosed in 10/2019. CT CAP and bone scan 11/2019 were negative for distant metastasis.  -S/p left mastectomy with Dr Marlou Starks on 12/26/19. Surgical path showed: 6cm multifocal metaplastic carcinoma; 13/16 positive LN, one of which was not able to be removed due to vascular invasion.  -s/p adjuvant carbo/taxol 02/05/20 - 05/13/20 followed by Adriamycin/cytoxan 05/20/20 - 07/13/20 along with [facility] Russell through 01/19/21.  She tolerated AC poorly with mucositis, weight loss, constipation, and weakness.  -interim left axillary [place] 06/17/20 is negative for adenopathy, c/w response to adjuvant chemo -s/p adjuvant radiation with Dr. Isidore Moos 08/24/20 - 10/05/20 -most recent right MM on 08/23/21 was negative. -restaging CT CAP 01/12/22 showed no definitive evidence of recurrent or metastatic disease.  -I personally reviewed her CT chest images from this morning with patient, which showed stable bilateral ground glass changes, slightly better than last scan, no new lesions. This is not typical from radiation since  it's in both lungs, could be pneumonitis from her immunotherapy.  Patient is asymptomatic, oxygen level remained to be normal after walking today. -Will continue monitoring.  Plan to repeat a CT scan in 6 months. -she will keep her port for now. She will continue to return for flush every 6-8 weeks.  Leg edema -started about 3-4 weeks ago -will obtain Doppler ultrasound to rule out a DVT -We discussed using compression stocks, leg elevation etc    PLAN: -lab reviewed -order a doppler of leg to rule out DVT  -she is scheduled for Colonoscopy nurse visit today -Mammogram Bone Density order for 08/2022 -lab port flush in 6 wks,f/u in 3 months     SUMMARY OF ONCOLOGIC HISTORY: Oncology History Overview Note  Cancer Staging Malignant neoplasm of upper-outer quadrant of left breast in female, estrogen receptor negative ([place]) Staging form: Breast, AJCC 8th Edition - Clinical stage from 10/28/2019: Stage IIIB (cT2, cN1, cM0, G3, ER-, PR-, HER2-) - Signed by Eppie Gibson, MD on 11/05/2019 - Pathologic stage from 12/26/2019: Stage IIIC (pT3, pN3a, cM0, G3, ER-, PR-, HER2-) - Signed by Truitt Merle, MD on 01/28/2020    Malignant neoplasm of upper-outer quadrant of left breast in female, estrogen receptor negative ([place])  10/27/2019 Mammogram   IMPRESSION: 1. Right breast calcifications spanning 2.6 cm in the upper slightly medial breast are indeterminate.   2. Left breast dominant mass at 3 o'clock, 6 cm from nipple measuring 3x3.9x2.9 cm is highly suspicious. There is overlying skin thickening, skin involvement is not excluded.   3. Left breast mass/distorted tissue at 1 o'clock, 4cm from nipple measuring 3.0x1.2x2.9 cm is likely in contiguity with the dominant mass at 3 o'clock and is also highly suspicious.  With the dominant lesion the overall span a disease is approximately 6 cm.   4. Left breast distortion at 2 o'clock 10 cm from the nipple is Suspicious, measuring 1.0 x 0.8 cm.   5.   Abnormal left axillary lymph nodes (2). Cortical thickness measures up to 0.6 cm.    10/28/2019 Initial Biopsy   Diagnosis 1. Breast, left, needle core biopsy, 3 o'clock, 5cmfn - INVASIVE MAMMARY CARCINOMA. 2. Breast, left, needle core biopsy, 2 o'clock, 10cmfn - INVASIVE MAMMARY CARCINOMA. 3. 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 Microscopic Comment 1. -3. Overall, immunohistochemistry favors a pronounced desmoplastic response, but metaplastic carcinoma cannot be excluded (Epithelioid component: CKAE1AE3 strong +, CK5/6 weak +). The greatest linear extent of tumor in any one core in specimen 1 is 14 mm. The greatest linear extent of tumor in any one core in specimen 2 is 16 mm.   10/28/2019 Receptors her2   3. PROGNOSTIC INDICATORS Results: IMMUNOHISTOCHEMICAL AND MORPHOMETRIC ANALYSIS PERFORMED MANUALLY The tumor cells are EQUIVOCAL for Her2 (2+). Her2 by FISH will be performed and results reported separately. Estrogen Receptor: 0%, NEGATIVE Progesterone Receptor: 0%, NEGATIVE Proliferation Marker Ki67: 10%    3. FLUORESCENCE IN-SITU HYBRIDIZATION Results: GROUP 5: HER2 **NEGATIVE** Equivocal form of amplification of the HER2 gene was detected in the IHC 2+ tissue sample received from this individual. HER2 FISH was performed by a technologist and cell imaging and analysis on the [employer].   10/28/2019 Cancer Staging   Staging form: Breast, AJCC 8th Edition - Clinical stage from 10/28/2019: Stage IIIB (cT2, cN1, cM0, G3, ER-, PR-, HER2-) - Signed by Eppie Gibson, MD on 11/05/2019   10/31/2019 Initial Diagnosis   Malignant neoplasm of upper-outer quadrant of left breast in female, estrogen receptor negative ([place])   10/31/2019 Pathology Results   Diagnosis Breast, right, needle core biopsy, UOQ, posterior - FOCAL USUAL DUCTAL HYPERPLASIA AND FIBROCYSTIC CHANGES WITH CALCIFICATIONS - FIBROADENOMATOID CHANGES - NO MALIGNANCY  IDENTIFIED Microscopic Comment These results were called to [facility] on November 03, 2019.   11/05/2019 Genetic Testing   She declined Genetic testing    11/21/2019 Imaging   CT CAP w contrast  IMPRESSION: 1. Left breast mass and prominent left axillary lymph nodes, containing biopsy marking clips, consistent with newly diagnosed breast malignancy. 2. No definite evidence of distant metastatic disease in the chest, abdomen, or pelvis. 3. There are occasional small pulmonary nodules, measuring 2-3 mm, most likely incidental sequelae of prior infection or inflammation although metastatic disease is not excluded. Attention on follow-up. 4. Hepatic steatosis. 5. Aortic Atherosclerosis (ICD10-I70.0).     11/21/2019 Imaging   Bone Scan Whole Body IMPRESSION: 1. Single focus of uptake associated with a subacute fracture of LEFT anterior fourth rib. 2. No additional areas of abnormal uptake.   12/26/2019 Cancer Staging   Staging form: Breast, AJCC 8th Edition - Pathologic stage from 12/26/2019: Stage IIIC (pT3, pN3a, cM0, G3, ER-, PR-, HER2-) - Signed by Truitt Merle, MD on 01/28/2020   12/26/2019 Surgery   LEFT MASTECTOMY MODIFIED RADICAL and PAC Placement by Dr Marlou Starks   12/26/2019 Pathology Results   FINAL MICROSCOPIC DIAGNOSIS:   A. 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  - Ductal carcinoma in situ, high nuclear grade with central necrosis.  - Margins of resection:  - Metaplastic carcinoma focally involves the anterior margin and is <  1  mm from the posterior margin.  - DCIS is < 1 mm from the anterior margin.  - Metastatic carcinoma in (13) of (16) lymph nodes with extranodal  extension.  - Biopsy clip sites in breast and one lymph node.  - See oncology table.    ADDENDUM:  PROGNOSTIC INDICATOR RESULTS:  Immunohistochemical and morphometric analysis performed  manually  The tumor cells are EQUIVOCAL for Her2 (2+). Her2 FISH has been ordered  and will be reported in an addendum.  Estrogen Receptor:       NEGATIVE  Progesterone Receptor:   NEGATIVE  Proliferation Marker Ki-67:   30%    ADDENDUM:  FLOURESCENCE IN-SITU HYBRIDIZATION RESULTS:  GROUP 5:   HER2 NEGATIVE   01/28/2020 Echocardiogram   Baseline Echo  IMPRESSIONS     1. Left ventricular ejection fraction, by estimation, is 60 to 65%. The  left ventricle has normal function. The left ventricle has no regional  wall motion abnormalities. Left ventricular diastolic parameters are  consistent with Grade I diastolic  dysfunction (impaired relaxation). The average left ventricular global  longitudinal strain is -19.8 %.   2. Right ventricular systolic function is normal. The right ventricular  size is normal. Tricuspid regurgitation signal is inadequate for assessing  PA pressure.   3. The mitral valve is normal in structure. No evidence of mitral valve  regurgitation. No evidence of mitral stenosis.   4. The aortic valve is normal in structure. Aortic valve regurgitation is  not visualized. No aortic stenosis is present.   5. The inferior vena cava is normal in size with greater than 50%  respiratory variability, suggesting right atrial pressure of 3 mmHg.    02/05/2020 - 01/19/2021 Chemotherapy   Keytruda q3weeks (to be taken for 1 whole year) with weekly Carboplatin/Taxol for 12 weeks starting 02/05/20-05/13/20 followed by Adriamycin/Cytoxan q2weeks X4 starting 05/20/20-07/13/20.  ----Continue Keytruda q3weeks from 08/03/20 to complete 1 year of treatment from 02/05/20).    06/17/2020 Breast US   FINDINGS: On physical exam,well-healed scars of LEFT mastectomy and LEFT axillary node dissection. I palpate no axillary mass. Ultrasound is performed, showing normal appearing LEFT axillary contents. No mass or enlarged lymph nodes. IMPRESSION: Ultrasound is negative for LEFT axillary  adenopathy.   08/24/2020 - 10/05/2020 Radiation Therapy   Adjuvant RT per Dr. Isidore Moos starting 08/24/20-10/05/20   04/14/2021 Survivorship   SCP delivered by Cira Rue, NP      INTERVAL HISTORY:  Lindsay [name] is here for a follow up of left breast cancer  She was last seen by me on 01/16/2022 She presents to the clinic alone. Pt reports she is not as strong as she use to be. Pt reports of Rt left leg started when she started taking the [place] for about a month.Pt denies having past injuries to the leg. Pt has some SOB when mobile for a long period of time.     All other systems were reviewed with the patient and are negative.  MEDICAL HISTORY:  Past Medical History:  Diagnosis Date   Bronchitis    Family history of bone cancer    Family history of breast cancer    Family history of prostate cancer    Fibromyalgia    HCV (hepatitis C virus)    MRSA (methicillin resistant Staphylococcus aureus) 2012    SURGICAL HISTORY: Past Surgical History:  Procedure Laterality Date   MASTECTOMY MODIFIED RADICAL Left 12/26/2019   Procedure: LEFT MASTECTOMY MODIFIED RADICAL;  Surgeon: Autumn Messing III,  MD;  Location: [facility];  Service: General;  Laterality: Left;  PEC BLOCK   PORTACATH PLACEMENT Right 12/26/2019   Procedure: INSERTION PORT-A-CATH WITH ULTRASOUND GUIDANCE;  Surgeon: [name], MD;  Location: [facility];  Service: General;  Laterality: Right;    I have reviewed the social history and family history with the patient and they are unchanged from previous note.  ALLERGIES:  is allergic to codeine.  MEDICATIONS:  Current Outpatient Medications  Medication Sig Dispense Refill   acetaminophen (TYLENOL) 650 MG CR tablet Take 650 mg by mouth every 8 (eight) hours as needed for pain.      ascorbic acid (VITAMIN C) 250 MG CHEW Chew by mouth.     dexamethasone (DECADRON) 0.5 MG/5ML solution Take 10 ml (1 mg) swish for 2 minutes and then spit.  Do this four times daily as needed Do Not  eat or drink for 1 hour after. 240 mL 0   diphenoxylate-atropine (LOMOTIL) 2.5-0.025 MG tablet Take 1-2 tablets by mouth 4 (four) times daily as needed for diarrhea or loose stools. 30 tablet 1   doxycycline (VIBRA-TABS) 100 MG tablet Take 1 tablet (100 mg total) by mouth 2 (two) times daily. 14 tablet 0   gabapentin (NEURONTIN) 300 MG capsule Take 300 mg by mouth 3 (three) times daily as needed.     lidocaine (XYLOCAINE) 2 % solution Use as directed 15 mLs in the mouth or throat every 4 (four) hours as needed for mouth pain. 100 mL 2   lidocaine-prilocaine (EMLA) cream Apply 1 Application topically as needed. 30 g 2   methocarbamol (ROBAXIN) 750 MG tablet Take 1 tablet (750 mg total) by mouth 4 (four) times daily as needed (use for muscle cramps/pain). 30 tablet 2   mucosal barrier oral (GELCLAIR) GEL Take 1 packet by mouth as needed.     omeprazole (PRILOSEC) 20 MG capsule Take 1 capsule (20 mg total) by mouth daily. 30 capsule 2   valACYclovir (VALTREX) 1000 MG tablet Take 1 tablet (1,000 mg total) by mouth 2 (two) times daily. 14 tablet 1   No current facility-administered medications for this visit.   Facility-Administered Medications Ordered in Other Visits  Medication Dose Route Frequency Provider Last Rate Last Admin   heparin lock flush 100 unit/mL  500 Units Intravenous Once Truitt Merle, MD        PHYSICAL EXAMINATION: ECOG PERFORMANCE STATUS: 0 - Asymptomatic  Vitals:   05/25/22 1114  BP: 119/67  Pulse: 86  Resp: 19  Temp: 98.1 F (36.7 C)  SpO2: 100%   Wt Readings from Last 3 Encounters:  05/25/22 168 lb 14.4 oz (76.6 kg)  01/16/22 161 lb 9.6 oz (73.3 kg)  11/15/21 157 lb (71.2 kg)     GENERAL:alert, no distress and comfortable SKIN: skin color normal, no rashes or significant lesions EYES: normal, Conjunctiva are pink and non-injected, sclera clear  NEURO: alert & oriented x 3 with fluent speech NECK: supple, thyroid normal size, non-tender, without  nodularity LYMPH:  (-) no palpable lymphadenopathy in the cervical, axillary  LUNGS:(-)  clear to auscultation and percussion with normal breathing effort BREAST: Left Breast Mastectomy some scare tissue. No palpable mass. Breast exam benign LE: (+) Bilateral lower extremity trace edema     LABORATORY DATA:  I have reviewed the data as listed    Latest Ref Rng & Units 05/25/2022    9:18 AM 03/02/2022   10:19 AM 01/12/2022    2:34 PM  CBC  WBC  4.0 - 10.5 K/uL 5.8  6.4  6.0   Hemoglobin 12.0 - 15.0 g/dL 12.8  12.9  13.1   Hematocrit 36.0 - 46.0 % 36.9  37.3  36.9   Platelets 150 - 400 K/uL 151  162  163         Latest Ref Rng & Units 05/25/2022    9:18 AM 03/02/2022   10:19 AM 01/12/2022    2:34 PM  CMP  Glucose 70 - 99 mg/dL 109  100  105   BUN 8 - 23 mg/dL '15  23  17   '$ Creatinine 0.44 - 1.00 mg/dL 0.62  0.75  0.59   Sodium 135 - 145 mmol/L 136  133  134   Potassium 3.5 - 5.1 mmol/L 3.9  4.1  4.2   Chloride 98 - 111 mmol/L 103  101  101   CO2 22 - 32 mmol/L '29  27  30   '$ Calcium 8.9 - 10.3 mg/dL 9.1  8.9  9.4   Total Protein 6.5 - 8.1 g/dL 5.6  6.1  6.3   Total Bilirubin 0.3 - 1.2 mg/dL 0.3  0.4  0.5   Alkaline Phos 38 - 126 U/L 61  62  61   AST 15 - 41 U/L 33  27  34   ALT 0 - 44 U/L '16  16  16       '$ RADIOGRAPHIC STUDIES: I have personally reviewed the radiological images as listed and agreed with the findings in the report. No results found.    No orders of the defined types were placed in this encounter.  All questions were answered. The patient knows to call the clinic with any problems, questions or concerns. No barriers to learning was detected. The total time spent in the appointment was 25 minutes.     Truitt Merle, MD 05/25/2022   Felicity Coyer, CMA, am acting as scribe for Truitt Merle, MD.   I have reviewed the above documentation for accuracy and completeness, and I agree with the above.

## 2022-05-26 ENCOUNTER — Telehealth: Payer: Self-pay

## 2022-05-26 ENCOUNTER — Ambulatory Visit (HOSPITAL_COMMUNITY)
Admission: RE | Admit: 2022-05-26 | Discharge: 2022-05-26 | Disposition: A | Discharge status: Home / Self Care | Payer: Medicare Other | Source: Ambulatory Visit | Attending: Hematology | Admitting: Hematology

## 2022-05-26 ENCOUNTER — Encounter (HOSPITAL_COMMUNITY): Payer: Medicare Other

## 2022-05-26 DIAGNOSIS — R600 Localized edema: Secondary | ICD-10-CM | POA: Insufficient documentation

## 2022-05-26 NOTE — Telephone Encounter (Signed)
Vascular Tech called stating pt's doppler was negative for DVT.  Notified Dr. Burr Medico of results.

## 2022-05-26 NOTE — Progress Notes (Signed)
Right lower extremity venous duplex has been completed. Preliminary results can be found in CV Proc through chart review.  Results were given to [facility] at Dr. Ernestina Penna office.  05/26/22 9:15 AM Carlos Levering RVT

## 2022-05-27 ENCOUNTER — Encounter: Payer: Self-pay | Admitting: Hematology

## 2022-05-29 ENCOUNTER — Ambulatory Visit: Payer: Medicare Other | Admitting: Hematology

## 2022-07-06 ENCOUNTER — Other Ambulatory Visit: Payer: Self-pay

## 2022-07-06 ENCOUNTER — Inpatient Hospital Stay: Payer: Medicare Other | Attending: Nurse Practitioner

## 2022-07-06 DIAGNOSIS — Z452 Encounter for adjustment and management of vascular access device: Secondary | ICD-10-CM | POA: Insufficient documentation

## 2022-07-06 DIAGNOSIS — C50412 Malignant neoplasm of upper-outer quadrant of left female breast: Secondary | ICD-10-CM | POA: Insufficient documentation

## 2022-07-06 DIAGNOSIS — Z95828 Presence of other vascular implants and grafts: Secondary | ICD-10-CM

## 2022-07-06 LAB — CBC WITH DIFFERENTIAL (CANCER CENTER ONLY)
Abs Immature Granulocytes: 0.01 10*3/uL (ref 0.00–0.07)
Basophils Absolute: 0.1 10*3/uL (ref 0.0–0.1)
Basophils Relative: 1 %
Eosinophils Absolute: 0.3 10*3/uL (ref 0.0–0.5)
Eosinophils Relative: 5 %
HCT: 36.8 % (ref 36.0–46.0)
Hemoglobin: 12.8 g/dL (ref 12.0–15.0)
Immature Granulocytes: 0 %
Lymphocytes Relative: 29 %
Lymphs Abs: 1.8 10*3/uL (ref 0.7–4.0)
MCH: 32.5 pg (ref 26.0–34.0)
MCHC: 34.8 g/dL (ref 30.0–36.0)
MCV: 93.4 fL (ref 80.0–100.0)
Monocytes Absolute: 0.6 10*3/uL (ref 0.1–1.0)
Monocytes Relative: 10 %
Neutro Abs: 3.5 10*3/uL (ref 1.7–7.7)
Neutrophils Relative %: 55 %
Platelet Count: 143 10*3/uL — ABNORMAL LOW (ref 150–400)
RBC: 3.94 MIL/uL (ref 3.87–5.11)
RDW: 12.7 % (ref 11.5–15.5)
WBC Count: 6.2 10*3/uL (ref 4.0–10.5)
nRBC: 0 % (ref 0.0–0.2)

## 2022-07-06 LAB — CMP (CANCER CENTER ONLY)
ALT: 16 U/L (ref 0–44)
AST: 35 U/L (ref 15–41)
Albumin: 3.9 g/dL (ref 3.5–5.0)
Alkaline Phosphatase: 71 U/L (ref 38–126)
Anion gap: 4 — ABNORMAL LOW (ref 5–15)
BUN: 14 mg/dL (ref 8–23)
CO2: 28 mmol/L (ref 22–32)
Calcium: 8.3 mg/dL — ABNORMAL LOW (ref 8.9–10.3)
Chloride: 103 mmol/L (ref 98–111)
Creatinine: 0.55 mg/dL (ref 0.44–1.00)
GFR, Estimated: >60 mL/min (ref >60)
Glucose, Bld: 94 mg/dL (ref 70–99)
Potassium: 4 mmol/L (ref 3.5–5.1)
Sodium: 135 mmol/L (ref 135–145)
Total Bilirubin: 0.4 mg/dL (ref 0.3–1.2)
Total Protein: 5.6 g/dL — ABNORMAL LOW (ref 6.5–8.1)

## 2022-07-06 MED ORDER — SODIUM CHLORIDE 0.9% FLUSH
10.0000 mL | Freq: Once | INTRAVENOUS | Status: AC
  Administered 2022-07-06: 10 mL

## 2022-07-06 MED ORDER — HEPARIN SOD (PORK) LOCK FLUSH 100 UNIT/ML IV SOLN
500.0000 [IU] | Freq: Once | INTRAVENOUS | Status: AC
  Administered 2022-07-06: 500 [IU]

## 2022-08-30 ENCOUNTER — Other Ambulatory Visit: Payer: Self-pay

## 2022-08-30 ENCOUNTER — Other Ambulatory Visit: Payer: Self-pay | Admitting: Hematology

## 2022-08-31 ENCOUNTER — Ambulatory Visit: Payer: Medicare Other

## 2022-08-31 ENCOUNTER — Encounter: Payer: Self-pay | Admitting: Hematology

## 2022-08-31 ENCOUNTER — Inpatient Hospital Stay (HOSPITAL_BASED_OUTPATIENT_CLINIC_OR_DEPARTMENT_OTHER): Payer: Medicare Other | Admitting: Hematology

## 2022-08-31 ENCOUNTER — Ambulatory Visit (HOSPITAL_COMMUNITY)
Admission: RE | Admit: 2022-08-31 | Discharge: 2022-08-31 | Disposition: A | Discharge status: Home / Self Care | Payer: Medicare Other | Source: Ambulatory Visit | Attending: Hematology | Admitting: Hematology

## 2022-08-31 ENCOUNTER — Inpatient Hospital Stay: Payer: Medicare Other | Attending: Nurse Practitioner

## 2022-08-31 ENCOUNTER — Other Ambulatory Visit: Payer: Self-pay

## 2022-08-31 VITALS — BP 101/63 | HR 93 | Temp 98.6°F | Resp 18 | Ht 61.0 in | Wt 171.2 lb

## 2022-08-31 DIAGNOSIS — Z853 Personal history of malignant neoplasm of breast: Secondary | ICD-10-CM | POA: Diagnosis present

## 2022-08-31 DIAGNOSIS — C50412 Malignant neoplasm of upper-outer quadrant of left female breast: Secondary | ICD-10-CM | POA: Diagnosis not present

## 2022-08-31 DIAGNOSIS — R058 Other specified cough: Secondary | ICD-10-CM

## 2022-08-31 DIAGNOSIS — Z452 Encounter for adjustment and management of vascular access device: Secondary | ICD-10-CM | POA: Diagnosis not present

## 2022-08-31 DIAGNOSIS — J40 Bronchitis, not specified as acute or chronic: Secondary | ICD-10-CM | POA: Insufficient documentation

## 2022-08-31 DIAGNOSIS — Z171 Estrogen receptor negative status [ER-]: Secondary | ICD-10-CM

## 2022-08-31 DIAGNOSIS — J069 Acute upper respiratory infection, unspecified: Secondary | ICD-10-CM | POA: Diagnosis not present

## 2022-08-31 DIAGNOSIS — Z08 Encounter for follow-up examination after completed treatment for malignant neoplasm: Secondary | ICD-10-CM | POA: Diagnosis present

## 2022-08-31 DIAGNOSIS — Z95828 Presence of other vascular implants and grafts: Secondary | ICD-10-CM

## 2022-08-31 LAB — CBC WITH DIFFERENTIAL (CANCER CENTER ONLY)
Abs Immature Granulocytes: 0.02 10*3/uL (ref 0.00–0.07)
Basophils Absolute: 0 10*3/uL (ref 0.0–0.1)
Basophils Relative: 1 %
Eosinophils Absolute: 0.4 10*3/uL (ref 0.0–0.5)
Eosinophils Relative: 5 %
HCT: 40 % (ref 36.0–46.0)
Hemoglobin: 13.8 g/dL (ref 12.0–15.0)
Immature Granulocytes: 0 %
Lymphocytes Relative: 20 %
Lymphs Abs: 1.6 10*3/uL (ref 0.7–4.0)
MCH: 32.3 pg (ref 26.0–34.0)
MCHC: 34.5 g/dL (ref 30.0–36.0)
MCV: 93.7 fL (ref 80.0–100.0)
Monocytes Absolute: 0.8 10*3/uL (ref 0.1–1.0)
Monocytes Relative: 9 %
Neutro Abs: 5.3 10*3/uL (ref 1.7–7.7)
Neutrophils Relative %: 65 %
Platelet Count: 147 10*3/uL — ABNORMAL LOW (ref 150–400)
RBC: 4.27 MIL/uL (ref 3.87–5.11)
RDW: 12.5 % (ref 11.5–15.5)
WBC Count: 8.1 10*3/uL (ref 4.0–10.5)
nRBC: 0 % (ref 0.0–0.2)

## 2022-08-31 LAB — CMP (CANCER CENTER ONLY)
ALT: 13 U/L (ref 0–44)
AST: 27 U/L (ref 15–41)
Albumin: 4 g/dL (ref 3.5–5.0)
Alkaline Phosphatase: 67 U/L (ref 38–126)
Anion gap: 5 (ref 5–15)
BUN: 9 mg/dL (ref 8–23)
CO2: 29 mmol/L (ref 22–32)
Calcium: 9.2 mg/dL (ref 8.9–10.3)
Chloride: 101 mmol/L (ref 98–111)
Creatinine: 0.62 mg/dL (ref 0.44–1.00)
GFR, Estimated: >60 mL/min (ref >60)
Glucose, Bld: 129 mg/dL — ABNORMAL HIGH (ref 70–99)
Potassium: 3.7 mmol/L (ref 3.5–5.1)
Sodium: 135 mmol/L (ref 135–145)
Total Bilirubin: 0.5 mg/dL (ref 0.3–1.2)
Total Protein: 6.3 g/dL — ABNORMAL LOW (ref 6.5–8.1)

## 2022-08-31 MED ORDER — HEPARIN SOD (PORK) LOCK FLUSH 100 UNIT/ML IV SOLN
500.0000 [IU] | Freq: Once | INTRAVENOUS | Status: AC
  Administered 2022-08-31: 500 [IU]

## 2022-08-31 MED ORDER — AMOXICILLIN-POT CLAVULANATE 875-125 MG PO TABS: 1.0000 | ORAL_TABLET | Freq: Two times a day (BID) | ORAL | 0 refills | Status: DC

## 2022-08-31 MED ORDER — SODIUM CHLORIDE 0.9% FLUSH
10.0000 mL | Freq: Once | INTRAVENOUS | Status: AC
  Administered 2022-08-31: 10 mL

## 2022-08-31 NOTE — Progress Notes (Signed)
[facility]   [phone]) [phone] [phone]   Clinic Follow up Note   Patient Care Team: Hart Robinsons, DO as PCP - General Pershing Proud, RN as Oncology Nurse Navigator Donnelly Angelica, RN as Oncology Nurse Navigator Griselda Miner, MD as Consulting Physician (General Surgery) Malachy Mood, MD as Consulting Physician (Hematology) Lonie Peak, MD as Attending Physician (Radiation Oncology) Pollyann Samples, NP as Nurse Practitioner (Nurse Practitioner)  Date of Service:  08/31/2022  CHIEF COMPLAINT: f/u of left breast cancer   CURRENT THERAPY:  Surveillance   ASSESSMENT:  Lindsay [name] is a 67 y.o. female with   Malignant neoplasm of upper-outer quadrant of left breast in female, estrogen receptor negative (HCC)  Left breast Multifocal Metaplastic cancer, stage IIIC pT3N3aM0, including one unresectable axillary node, metaplastic carcinoma, triple negative, grade 3 -diagnosed in 10/2019. CT CAP and bone scan 11/2019 were negative for distant metastasis.  -S/p left mastectomy with Dr Carolynne Edouard on 12/26/19. Surgical path showed: 6cm multifocal metaplastic carcinoma; 13/16 positive LN, one of which was not able to be removed due to vascular invasion.  -s/p adjuvant carbo/taxol 02/05/20 - 05/13/20 followed by Adriamycin/cytoxan 05/20/20 - 07/13/20 along with Domingo Sep through 01/19/21.  She tolerated AC poorly with mucositis, weight loss, constipation, and weakness.  -interim left axillary [place] 06/17/20 is negative for adenopathy, c/w response to adjuvant chemo -s/p adjuvant radiation with Dr. Basilio Cairo 08/24/20 - 10/05/20 -most recent right MM on 08/23/21 was negative. -restaging CT CAP 01/12/22 showed no definitive evidence of recurrent or metastatic disease.  -I personally reviewed her CT chest images from this morning with patient, which showed stable bilateral ground glass changes, slightly better than last scan, no new lesions. This is not typical from radiation  since it's in both lungs, could be pneumonitis from her immunotherapy.  Patient is asymptomatic, oxygen level remained to be normal after walking today. -Will continue monitoring.  Plan to repeat a CT scan in 3 months. -she will keep her port for now. She will continue to return for flush every 6-8 weeks.  URI -She developed a productive cough, extreme fatigue, low appetite, and chills for 5 days ago, no fever. -Will get a chest x-ray today -I called in Augmentin for her  PLAN: -lab reviewed -I prescribe Augmentin -order Xray to rule pneumonia -Order CT scan in 3 months -lab f/u in  3 months, port flush in 6 weeks.   SUMMARY OF ONCOLOGIC HISTORY: Oncology History Overview Note  Cancer Staging Malignant neoplasm of upper-outer quadrant of left breast in female, estrogen receptor negative (HCC) Staging form: Breast, AJCC 8th Edition - Clinical stage from 10/28/2019: Stage IIIB (cT2, cN1, cM0, G3, ER-, PR-, HER2-) - Signed by Lonie Peak, MD on 11/05/2019 - Pathologic stage from 12/26/2019: Stage IIIC (pT3, pN3a, cM0, G3, ER-, PR-, HER2-) - Signed by Malachy Mood, MD on 01/28/2020    Malignant neoplasm of upper-outer quadrant of left breast in female, estrogen receptor negative  10/27/2019 Mammogram   IMPRESSION: 1. Right breast calcifications spanning 2.6 cm in the upper slightly medial breast are indeterminate.   2. Left breast dominant mass at 3 o'clock, 6 cm from nipple measuring 3x3.9x2.9 cm is highly suspicious. There is overlying skin thickening, skin involvement is not excluded.   3. Left breast mass/distorted tissue at 1 o'clock, 4cm from nipple measuring 3.0x1.2x2.9 cm is likely in contiguity with the dominant mass at 3 o'clock and is also highly suspicious. With the dominant lesion the overall  span a disease is approximately 6 cm.   4. Left breast distortion at 2 o'clock 10 cm from the nipple is Suspicious, measuring 1.0 x 0.8 cm.   5.  Abnormal left axillary lymph nodes  (2). Cortical thickness measures up to 0.6 cm.    10/28/2019 Initial Biopsy   Diagnosis 1. Breast, left, needle core biopsy, 3 o'clock, 5cmfn - INVASIVE MAMMARY CARCINOMA. 2. Breast, left, needle core biopsy, 2 o'clock, 10cmfn - INVASIVE MAMMARY CARCINOMA. 3. 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 Microscopic Comment 1. -3. Overall, immunohistochemistry favors a pronounced desmoplastic response, but metaplastic carcinoma cannot be excluded (Epithelioid component: CKAE1AE3 strong +, CK5/6 weak +). The greatest linear extent of tumor in any one core in specimen 1 is 14 mm. The greatest linear extent of tumor in any one core in specimen 2 is 16 mm.   10/28/2019 Receptors her2   3. PROGNOSTIC INDICATORS Results: IMMUNOHISTOCHEMICAL AND MORPHOMETRIC ANALYSIS PERFORMED MANUALLY The tumor cells are EQUIVOCAL for Her2 (2+). Her2 by FISH will be performed and results reported separately. Estrogen Receptor: 0%, NEGATIVE Progesterone Receptor: 0%, NEGATIVE Proliferation Marker Ki67: 10%    3. FLUORESCENCE IN-SITU HYBRIDIZATION Results: GROUP 5: HER2 **NEGATIVE** Equivocal form of amplification of the HER2 gene was detected in the IHC 2+ tissue sample received from this individual. HER2 FISH was performed by a technologist and cell imaging and analysis on the [employer].   10/28/2019 Cancer Staging   Staging form: Breast, AJCC 8th Edition - Clinical stage from 10/28/2019: Stage IIIB (cT2, cN1, cM0, G3, ER-, PR-, HER2-) - Signed by Lonie Peak, MD on 11/05/2019   10/31/2019 Initial Diagnosis   Malignant neoplasm of upper-outer quadrant of left breast in female, estrogen receptor negative (HCC)   10/31/2019 Pathology Results   Diagnosis Breast, right, needle core biopsy, UOQ, posterior - FOCAL USUAL DUCTAL HYPERPLASIA AND FIBROCYSTIC CHANGES WITH CALCIFICATIONS - FIBROADENOMATOID CHANGES - NO MALIGNANCY IDENTIFIED Microscopic  Comment These results were called to [facility] on November 03, 2019.   11/05/2019 Genetic Testing   She declined Genetic testing    11/21/2019 Imaging   CT CAP w contrast  IMPRESSION: 1. Left breast mass and prominent left axillary lymph nodes, containing biopsy marking clips, consistent with newly diagnosed breast malignancy. 2. No definite evidence of distant metastatic disease in the chest, abdomen, or pelvis. 3. There are occasional small pulmonary nodules, measuring 2-3 mm, most likely incidental sequelae of prior infection or inflammation although metastatic disease is not excluded. Attention on follow-up. 4. Hepatic steatosis. 5. Aortic Atherosclerosis (ICD10-I70.0).     11/21/2019 Imaging   Bone Scan Whole Body IMPRESSION: 1. Single focus of uptake associated with a subacute fracture of LEFT anterior fourth rib. 2. No additional areas of abnormal uptake.   12/26/2019 Cancer Staging   Staging form: Breast, AJCC 8th Edition - Pathologic stage from 12/26/2019: Stage IIIC (pT3, pN3a, cM0, G3, ER-, PR-, HER2-) - Signed by Malachy Mood, MD on 01/28/2020   12/26/2019 Surgery   LEFT MASTECTOMY MODIFIED RADICAL and PAC Placement by Dr Carolynne Edouard   12/26/2019 Pathology Results   FINAL MICROSCOPIC DIAGNOSIS:   A. 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  - Ductal carcinoma in situ, high nuclear grade with central necrosis.  - Margins of resection:  - Metaplastic carcinoma focally involves the anterior margin and is < 1  mm from the  posterior margin.  - DCIS is < 1 mm from the anterior margin.  - Metastatic carcinoma in (13) of (16) lymph nodes with extranodal  extension.  - Biopsy clip sites in breast and one lymph node.  - See oncology table.    ADDENDUM:  PROGNOSTIC INDICATOR RESULTS:  Immunohistochemical and morphometric analysis performed manually  The tumor cells are  EQUIVOCAL for Her2 (2+). Her2 FISH has been ordered  and will be reported in an addendum.  Estrogen Receptor:       NEGATIVE  Progesterone Receptor:   NEGATIVE  Proliferation Marker Ki-67:   30%    ADDENDUM:  FLOURESCENCE IN-SITU HYBRIDIZATION RESULTS:  GROUP 5:   HER2 NEGATIVE   01/28/2020 Echocardiogram   Baseline Echo  IMPRESSIONS     1. Left ventricular ejection fraction, by estimation, is 60 to 65%. The  left ventricle has normal function. The left ventricle has no regional  wall motion abnormalities. Left ventricular diastolic parameters are  consistent with Grade I diastolic  dysfunction (impaired relaxation). The average left ventricular global  longitudinal strain is -19.8 %.   2. Right ventricular systolic function is normal. The right ventricular  size is normal. Tricuspid regurgitation signal is inadequate for assessing  PA pressure.   3. The mitral valve is normal in structure. No evidence of mitral valve  regurgitation. No evidence of mitral stenosis.   4. The aortic valve is normal in structure. Aortic valve regurgitation is  not visualized. No aortic stenosis is present.   5. The inferior vena cava is normal in size with greater than 50%  respiratory variability, suggesting right atrial pressure of 3 mmHg.    02/05/2020 - 01/19/2021 Chemotherapy   Keytruda q3weeks (to be taken for 1 whole year) with weekly Carboplatin/Taxol for 12 weeks starting 02/05/20-05/13/20 followed by Adriamycin/Cytoxan q2weeks X4 starting 05/20/20-07/13/20.  ----Continue Keytruda q3weeks from 08/03/20 to complete 1 year of treatment from 02/05/20).    06/17/2020 Breast US   FINDINGS: On physical exam,well-healed scars of LEFT mastectomy and LEFT axillary node dissection. I palpate no axillary mass. Ultrasound is performed, showing normal appearing LEFT axillary contents. No mass or enlarged lymph nodes. IMPRESSION: Ultrasound is negative for LEFT axillary adenopathy.   08/24/2020 - 10/05/2020  Radiation Therapy   Adjuvant RT per Dr. Basilio Cairo starting 08/24/20-10/05/20   04/14/2021 Survivorship   SCP delivered by Santiago Glad, NP      INTERVAL HISTORY:  Burt Knack is here for a follow up of left breast cancer . She was last seen by me on 05/08/2022. She presents to the clinic alone. Pt report she has a bronchial infection.Pt stated that it started Sunday. Pt state she has a low grade fever, Pt state she coughing of phlegm. Pt state that she is fatigue and she still able to do her ADL'S. Pt  states that she had a sharp pain in her lower rt quadrant. Lasted a minute.     All other systems were reviewed with the patient and are negative.  MEDICAL HISTORY:  Past Medical History:  Diagnosis Date   Bronchitis    Family history of bone cancer    Family history of breast cancer    Family history of prostate cancer    Fibromyalgia    HCV (hepatitis C virus)    MRSA (methicillin resistant Staphylococcus aureus) 2012    SURGICAL HISTORY: Past Surgical History:  Procedure Laterality Date   MASTECTOMY MODIFIED RADICAL Left 12/26/2019   Procedure: LEFT MASTECTOMY MODIFIED RADICAL;  Surgeon: Griselda Miner, MD;  Location: [facility] OR;  Service: General;  Laterality: Left;  PEC BLOCK   PORTACATH PLACEMENT Right 12/26/2019   Procedure: INSERTION PORT-A-CATH WITH ULTRASOUND GUIDANCE;  Surgeon: Griselda Miner, MD;  Location: [facility] OR;  Service: General;  Laterality: Right;    I have reviewed the social history and family history with the patient and they are unchanged from previous note.  ALLERGIES:  is allergic to codeine.  MEDICATIONS:  Current Outpatient Medications  Medication Sig Dispense Refill   amoxicillin-clavulanate (AUGMENTIN) 875-125 MG tablet Take 1 tablet by mouth 2 (two) times daily. 14 tablet 0   acetaminophen (TYLENOL) 650 MG CR tablet Take 650 mg by mouth every 8 (eight) hours as needed for pain.      ascorbic acid (VITAMIN C) 250 MG CHEW Chew by mouth.     dexamethasone  (DECADRON) 0.5 MG/5ML solution Take 10 ml (1 mg) swish for 2 minutes and then spit.  Do this four times daily as needed Do Not eat or drink for 1 hour after. 240 mL 0   diphenoxylate-atropine (LOMOTIL) 2.5-0.025 MG tablet Take 1-2 tablets by mouth 4 (four) times daily as needed for diarrhea or loose stools. 30 tablet 1   doxycycline (VIBRA-TABS) 100 MG tablet Take 1 tablet (100 mg total) by mouth 2 (two) times daily. 14 tablet 0   gabapentin (NEURONTIN) 300 MG capsule Take 300 mg by mouth 3 (three) times daily as needed.     lidocaine (XYLOCAINE) 2 % solution Use as directed 15 mLs in the mouth or throat every 4 (four) hours as needed for mouth pain. 100 mL 2   lidocaine-prilocaine (EMLA) cream Apply 1 Application topically as needed. 30 g 2   methocarbamol (ROBAXIN) 750 MG tablet Take 1 tablet (750 mg total) by mouth 4 (four) times daily as needed (use for muscle cramps/pain). 30 tablet 2   mucosal barrier oral (GELCLAIR) GEL Take 1 packet by mouth as needed.     omeprazole (PRILOSEC) 20 MG capsule Take 1 capsule (20 mg total) by mouth daily. 30 capsule 2   valACYclovir (VALTREX) 1000 MG tablet Take 1 tablet (1,000 mg total) by mouth 2 (two) times daily. 14 tablet 1   No current facility-administered medications for this visit.    PHYSICAL EXAMINATION: ECOG PERFORMANCE STATUS: 2 - Symptomatic, <50% confined to bed  Vitals:   08/31/22 1046  BP: 101/63  Pulse: 93  Resp: 18  Temp: 98.6 F (37 C)  SpO2: 96%   Wt Readings from Last 3 Encounters:  08/31/22 171 lb 3.2 oz (77.7 kg)  05/25/22 168 lb 14.4 oz (76.6 kg)  01/16/22 161 lb 9.6 oz (73.3 kg)    GENERAL:alert, no distress and comfortable SKIN: skin color, texture, turgor are normal, no rashes or significant lesions EYES:(-)  normal, Conjunctiva are pink and non-injected, sclera clear NECK: supple, thyroid normal size, non-tender, without nodularity LYMPH:(-)   no palpable lymphadenopathy in the cervical, axillary  LUNGS:(+)   scatter b/l rhonchi, no wheezing HEART: (-) regular rate & rhythm and no murmurs and no lower extremity edema ABDOMEN:(-) abdomen soft, non-tender and normal bowel sounds Breast: Rt breast  no palpable mass, breast exam benign, Lt breast Mastectomy no palpable mass breast exam benign.   LABORATORY DATA:  I have reviewed the data as listed    Latest Ref Rng & Units 08/31/2022   10:03 AM 07/06/2022    9:47 AM 05/25/2022    9:18 AM  CBC  WBC  4.0 - 10.5 K/uL 8.1  6.2  5.8   Hemoglobin 12.0 - 15.0 g/dL 13.0  86.5  78.4   Hematocrit 36.0 - 46.0 % 40.0  36.8  36.9   Platelets 150 - 400 K/uL 147  143  151         Latest Ref Rng & Units 08/31/2022   10:03 AM 07/06/2022    9:47 AM 05/25/2022    9:18 AM  CMP  Glucose 70 - 99 mg/dL 696  94  295   BUN 8 - 23 mg/dL 9  14  15    Creatinine 0.44 - 1.00 mg/dL 2.84  1.32  4.40   Sodium 135 - 145 mmol/L 135  135  136   Potassium 3.5 - 5.1 mmol/L 3.7  4.0  3.9   Chloride 98 - 111 mmol/L 101  103  103   CO2 22 - 32 mmol/L 29  28  29    Calcium 8.9 - 10.3 mg/dL 9.2  8.3  9.1   Total Protein 6.5 - 8.1 g/dL 6.3  5.6  5.6   Total Bilirubin 0.3 - 1.2 mg/dL 0.5  0.4  0.3   Alkaline Phos 38 - 126 U/L 67  71  61   AST 15 - 41 U/L 27  35  33   ALT 0 - 44 U/L 13  16  16        RADIOGRAPHIC STUDIES: I have personally reviewed the radiological images as listed and agreed with the findings in the report. No results found.    Orders Placed This Encounter  Procedures   DG Chest 2 View    Standing Status:   Future    Number of Occurrences:   1    Standing Expiration Date:   08/31/2023    Order Specific Question:   Reason for Exam (SYMPTOM  OR DIAGNOSIS REQUIRED)    Answer:   productive cough for 5 days    Order Specific Question:   Preferred imaging location?    Answer:   [facility]   CT Chest Wo Contrast    Standing Status:   Future    Standing Expiration Date:   08/31/2023    Order Specific Question:   Preferred imaging location?     Answer:   [facility]   All questions were answered. The patient knows to call the clinic with any problems, questions or concerns. No barriers to learning was detected. The total time spent in the appointment was 25 minutes.     Malachy Mood, MD 08/31/2022   Carolin Coy, CMA, am acting as scribe for Malachy Mood, MD.   I have reviewed the above documentation for accuracy and completeness, and I agree with the above.

## 2022-08-31 NOTE — Assessment & Plan Note (Signed)
Left breast Multifocal Metaplastic cancer, stage IIIC pT3N3aM0, including one unresectable axillary node, metaplastic carcinoma, triple negative, grade 3 -diagnosed in 10/2019. CT CAP and bone scan 11/2019 were negative for distant metastasis.  -S/p left mastectomy with Dr Carolynne Edouard on 12/26/19. Surgical path showed: 6cm multifocal metaplastic carcinoma; 13/16 positive LN, one of which was not able to be removed due to vascular invasion.  -s/p adjuvant carbo/taxol 02/05/20 - 05/13/20 followed by Adriamycin/cytoxan 05/20/20 - 07/13/20 along with Domingo Sep through 01/19/21.  She tolerated AC poorly with mucositis, weight loss, constipation, and weakness.  -interim left axillary [place] 06/17/20 is negative for adenopathy, c/w response to adjuvant chemo -s/p adjuvant radiation with Dr. Basilio Cairo 08/24/20 - 10/05/20 -most recent right MM on 08/23/21 was negative. -restaging CT CAP 01/12/22 showed no definitive evidence of recurrent or metastatic disease.  -I personally reviewed her CT chest images from this morning with patient, which showed stable bilateral ground glass changes, slightly better than last scan, no new lesions. This is not typical from radiation since it's in both lungs, could be pneumonitis from her immunotherapy.  Patient is asymptomatic, oxygen level remained to be normal after walking today. -Will continue monitoring.  Plan to repeat a CT scan in 3 months. -she will keep her port for now. She will continue to return for flush every 6-8 weeks.

## 2022-09-07 ENCOUNTER — Other Ambulatory Visit: Payer: Self-pay | Admitting: Hematology

## 2022-09-07 ENCOUNTER — Other Ambulatory Visit: Payer: Self-pay

## 2022-09-07 MED ORDER — LEVOFLOXACIN 500 MG PO TABS: 500.0000 mg | ORAL_TABLET | Freq: Every day | ORAL | 0 refills | Status: DC

## 2022-09-07 MED ORDER — BENZONATATE 200 MG PO CAPS: 200.0000 mg | ORAL_CAPSULE | Freq: Three times a day (TID) | ORAL | 0 refills | Status: AC | PRN

## 2022-09-10 ENCOUNTER — Emergency Department (HOSPITAL_BASED_OUTPATIENT_CLINIC_OR_DEPARTMENT_OTHER)
Admission: EM | Admit: 2022-09-10 | Discharge: 2022-09-10 | Disposition: A | Discharge status: Home / Self Care | Payer: Medicare Other | Attending: Emergency Medicine | Admitting: Emergency Medicine

## 2022-09-10 ENCOUNTER — Encounter (HOSPITAL_BASED_OUTPATIENT_CLINIC_OR_DEPARTMENT_OTHER): Payer: Self-pay | Admitting: Emergency Medicine

## 2022-09-10 ENCOUNTER — Emergency Department (HOSPITAL_BASED_OUTPATIENT_CLINIC_OR_DEPARTMENT_OTHER): Payer: Medicare Other | Admitting: Radiology

## 2022-09-10 ENCOUNTER — Other Ambulatory Visit: Payer: Self-pay

## 2022-09-10 DIAGNOSIS — R062 Wheezing: Secondary | ICD-10-CM | POA: Diagnosis not present

## 2022-09-10 DIAGNOSIS — R052 Subacute cough: Secondary | ICD-10-CM

## 2022-09-10 DIAGNOSIS — J40 Bronchitis, not specified as acute or chronic: Secondary | ICD-10-CM | POA: Diagnosis not present

## 2022-09-10 DIAGNOSIS — R2243 Localized swelling, mass and lump, lower limb, bilateral: Secondary | ICD-10-CM | POA: Diagnosis not present

## 2022-09-10 DIAGNOSIS — R059 Cough, unspecified: Secondary | ICD-10-CM | POA: Diagnosis present

## 2022-09-10 DIAGNOSIS — Z1152 Encounter for screening for COVID-19: Secondary | ICD-10-CM | POA: Diagnosis not present

## 2022-09-10 HISTORY — DX: Zoster without complications: B02.9

## 2022-09-10 HISTORY — DX: Malignant neoplasm of unspecified site of unspecified female breast: C50.919

## 2022-09-10 LAB — RESP PANEL BY RT-PCR (RSV, FLU A&B, COVID)  RVPGX2
Influenza A by PCR: NEGATIVE
Influenza B by PCR: NEGATIVE
Resp Syncytial Virus by PCR: NEGATIVE
SARS Coronavirus 2 by RT PCR: NEGATIVE

## 2022-09-10 MED ORDER — PREDNISONE 10 MG PO TABS: 40.0000 mg | ORAL_TABLET | Freq: Every day | ORAL | 0 refills | Status: AC

## 2022-09-10 MED ORDER — AEROCHAMBER PLUS FLO-VU MISC
1.0000 | Freq: Once | Status: AC
  Administered 2022-09-10: 1
  Filled 2022-09-10: qty 1

## 2022-09-10 MED ORDER — AMOXICILLIN-POT CLAVULANATE 875-125 MG PO TABS: 1.0000 | ORAL_TABLET | Freq: Two times a day (BID) | ORAL | 0 refills | Status: AC

## 2022-09-10 MED ORDER — PREDNISONE 20 MG PO TABS
40.0000 mg | ORAL_TABLET | Freq: Once | ORAL | Status: AC
  Administered 2022-09-10: 40 mg via ORAL
  Filled 2022-09-10: qty 2

## 2022-09-10 MED ORDER — ALBUTEROL SULFATE HFA 108 (90 BASE) MCG/ACT IN AERS
2.0000 | INHALATION_SPRAY | RESPIRATORY_TRACT | Status: AC
  Administered 2022-09-10: 2 via RESPIRATORY_TRACT
  Filled 2022-09-10: qty 6.7

## 2022-09-10 MED ORDER — AMOXICILLIN-POT CLAVULANATE 875-125 MG PO TABS
1.0000 | ORAL_TABLET | Freq: Once | ORAL | Status: AC
  Administered 2022-09-10: 1 via ORAL
  Filled 2022-09-10: qty 1

## 2022-09-10 NOTE — Discharge Instructions (Signed)
You were seen for your cough in the emergency department.   At home, please take the steroids we have prescribed you in the antibiotics.  Use the inhaler as needed for any wheezing. Please use over-the-counter cough medication or tea with honey for your cough.  Follow-up with your primary doctor in 2-3 days regarding your visit.    Return immediately to the emergency department if you experience any of the following: Difficulty breathing, or any other concerning symptoms.    Thank you for visiting our Emergency Department. It was a pleasure taking care of you today.

## 2022-09-10 NOTE — ED Provider Notes (Signed)
[place] EMERGENCY DEPARTMENT AT [facility] Provider Note   CSN: [id number] Arrival date & time: 09/10/22  1007     History  No chief complaint on file.   Lindsay [name] is a 67 y.o. female.  67 year old female with a history of breast cancer in remission for the past 4 years and chronic bronchitis who presents to the emergency department with cough and congestion.  Patient says that on 08/28/2022 she started developing symptoms of chronic bronchitis.  Says that she has a flare typically every year that resolved with Augmentin.  Says she has had wheezing and a productive cough as well as congestion.  Says that she occasionally feels chest burning while coughing but not otherwise.  No new lower extremity swelling.  No history of DVT or PE and is not on blood thinners.  Says that she was given 7 pills of Augmentin without relief of her symptoms so she wanted to come in today to be evaluated.  Has not tried any steroids or inhalers.  No history of smoking.       Home Medications Prior to Admission medications   Medication Sig Start Date End Date Taking? Authorizing Provider  predniSONE (DELTASONE) 10 MG tablet Take 4 tablets (40 mg total) by mouth daily for 4 days. 09/10/22 09/14/22 Yes Rondel Baton, MD  acetaminophen (TYLENOL) 650 MG CR tablet Take 650 mg by mouth every 8 (eight) hours as needed for pain.     [provider]  amoxicillin-clavulanate (AUGMENTIN) 875-125 MG tablet Take 1 tablet by mouth 2 (two) times daily for 7 days. 09/10/22 09/17/22  Rondel Baton, MD  ascorbic acid (VITAMIN C) 250 MG CHEW Chew by mouth.    [provider]  benzonatate (TESSALON) 200 MG capsule Take 1 capsule (200 mg total) by mouth 3 (three) times daily as needed for cough. 09/07/22   Malachy Mood, MD  dexamethasone (DECADRON) 0.5 MG/5ML solution Take 10 ml (1 mg) swish for 2 minutes and then spit.  Do this four times daily as needed Do Not eat or drink for 1 hour after.  09/30/20   Pollyann Samples, NP  diphenoxylate-atropine (LOMOTIL) 2.5-0.025 MG tablet Take 1-2 tablets by mouth 4 (four) times daily as needed for diarrhea or loose stools. 02/19/20   Malachy Mood, MD  doxycycline (VIBRA-TABS) 100 MG tablet Take 1 tablet (100 mg total) by mouth 2 (two) times daily. 07/21/21   Pollyann Samples, NP  gabapentin (NEURONTIN) 300 MG capsule Take 300 mg by mouth 3 (three) times daily as needed. 01/22/20   [provider]  levofloxacin (LEVAQUIN) 500 MG tablet Take 1 tablet (500 mg total) by mouth daily. 09/07/22   Malachy Mood, MD  lidocaine (XYLOCAINE) 2 % solution Use as directed 15 mLs in the mouth or throat every 4 (four) hours as needed for mouth pain. 06/11/20   Malachy Mood, MD  lidocaine-prilocaine (EMLA) cream Apply 1 Application topically as needed. 05/23/22   Malachy Mood, MD  methocarbamol (ROBAXIN) 750 MG tablet Take 1 tablet (750 mg total) by mouth 4 (four) times daily as needed (use for muscle cramps/pain). 12/26/19   Griselda Miner, MD  mucosal barrier oral (GELCLAIR) GEL Take 1 packet by mouth as needed.    [provider]  omeprazole (PRILOSEC) 20 MG capsule Take 1 capsule (20 mg total) by mouth daily. 02/12/20   Malachy Mood, MD  valACYclovir (VALTREX) 1000 MG tablet Take 1 tablet (1,000 mg total) by mouth 2 (two)  times daily. 06/11/20   Malachy Mood, MD  prochlorperazine (COMPAZINE) 10 MG tablet Take 1 tablet (10 mg total) by mouth every 6 (six) hours as needed (Nausea or vomiting). 01/28/20 08/03/20  Malachy Mood, MD      Allergies    Codeine    Review of Systems   Review of Systems  Physical Exam Updated Vital Signs BP 121/85   Pulse 90   Temp 98.7 F (37.1 C) (Oral)   Resp 20   SpO2 98%  Physical Exam Vitals and nursing note reviewed.  Constitutional:      General: She is not in acute distress.    Appearance: She is well-developed.  HENT:     Head: Normocephalic and atraumatic.     Right Ear: External ear normal.     Left Ear: External ear normal.      Nose: Nose normal.  Eyes:     Extraocular Movements: Extraocular movements intact.     Conjunctiva/sclera: Conjunctivae normal.     Pupils: Pupils are equal, round, and reactive to light.  Cardiovascular:     Rate and Rhythm: Normal rate and regular rhythm.     Heart sounds: No murmur heard. Pulmonary:     Effort: Pulmonary effort is normal. No respiratory distress.     Breath sounds: Wheezing (faint expiratory in R upper lobe) present.  Musculoskeletal:     Cervical back: Normal range of motion and neck supple.     Right lower leg: Edema (1+) present.     Left lower leg: Edema (1+) present.  Skin:    General: Skin is warm and dry.  Neurological:     Mental Status: She is alert and oriented to person, place, and time. Mental status is at baseline.  Psychiatric:        Mood and Affect: Mood normal.     ED Results / Procedures / Treatments   Labs (all labs ordered are listed, but only abnormal results are displayed) Labs Reviewed  RESP PANEL BY RT-PCR (RSV, FLU A&B, COVID)  [id number]    EKG None  Radiology No results found.  Procedures Procedures   Medications Ordered in ED Medications  albuterol (VENTOLIN HFA) 108 (90 Base) MCG/ACT inhaler 2 puff (2 puffs Inhalation Given 09/10/22 1144)  predniSONE (DELTASONE) tablet 40 mg (40 mg Oral Given 09/10/22 1051)  amoxicillin-clavulanate (AUGMENTIN) 875-125 MG per tablet 1 tablet (1 tablet Oral Given 09/10/22 1051)  aerochamber plus with mask device 1 each (1 each Other Given 09/10/22 1145)    ED Course/ Medical Decision Making/ A&P                             Medical Decision Making Amount and/or Complexity of Data Reviewed Radiology: ordered.  Risk Prescription drug management.   Lindsay [name] is a 67 y.o. female with comorbidities that complicate the patient evaluation including  breast cancer in remission for the past 4 years and chronic bronchitis who presents to the emergency department with cough and congestion.      Initial Ddx:  Viral bronchitis, URI, pneumonia, COPD, PE  MDM:  The patient may have viral bronchitis at this time.  Will test her for COVID and flu and other URIs.  With her wheezing in the right upper lobe may have a pneumonia and may benefit from nebulizer therapy and steroids.  Patient overall very well-appearing and satting well on room air.  Do not feel that labs are  warranted at this time given her overall appearance.  Did also consider PE but given the patient's congestion and cyclical nature of this illness that she has feel this is less likely.  Plan:  COVID and flu Chest x-ray Prednisone Augmentin Albuterol  ED Summary/Re-evaluation:  Patient received albuterol and was feeling better.  Chest x-ray and URI swabs did not reveal signs of infection.  Patient reports that her symptoms improved with Augmentin before with her right upper lobe wheezing could potentially have pneumonia so we will give her several more days of the Augmentin but if this does not work do not feel that she needs additional antibiotics.  Will also send her home with prednisone and albuterol.  Instructed to follow-up with her primary doctor in several days.  This patient presents to the ED for concern of complaints listed in HPI, this involves an extensive number of treatment options, and is a complaint that carries with it a high risk of complications and morbidity. Disposition including potential need for admission considered.   Dispo: DC Home. Return precautions discussed including, but not limited to, those listed in the AVS. Allowed pt time to ask questions which were answered fully prior to dc.  Records reviewed Outpatient Clinic Notes I independently reviewed the following imaging with scope of interpretation limited to determining acute life threatening conditions related to emergency care: Chest x-ray and agree with the radiologist interpretation with the following exceptions: none I personally  reviewed and interpreted cardiac monitoring: normal sinus rhythm  I personally reviewed and interpreted the pt's EKG: see above for interpretation  I have reviewed the patients home medications and made adjustments as needed Social Determinants of health:  Elderly  Final Clinical Impression(s) / ED Diagnoses Final diagnoses:  Bronchitis  Subacute cough    Rx / DC Orders ED Discharge Orders          Ordered    predniSONE (DELTASONE) 10 MG tablet  Daily        09/10/22 1157    amoxicillin-clavulanate (AUGMENTIN) 875-125 MG tablet  2 times daily        09/10/22 1157              Rondel Baton, MD 09/12/22 1226

## 2022-09-10 NOTE — ED Notes (Signed)
Patient transported to xr

## 2022-09-10 NOTE — ED Triage Notes (Signed)
Cough since 22nd, has hx breast cancer. No active treatments

## 2022-09-10 NOTE — ED Notes (Signed)
Dc instructions reviewed with patient. Patient voiced understanding. Dc with belongings.  °

## 2022-09-13 ENCOUNTER — Other Ambulatory Visit: Payer: Self-pay | Admitting: Hematology and Oncology

## 2022-10-11 ENCOUNTER — Encounter: Payer: Self-pay | Admitting: Hematology

## 2022-10-12 ENCOUNTER — Inpatient Hospital Stay: Payer: Medicare Other | Attending: Nurse Practitioner

## 2022-10-12 ENCOUNTER — Other Ambulatory Visit: Payer: Self-pay

## 2022-10-12 DIAGNOSIS — Z452 Encounter for adjustment and management of vascular access device: Secondary | ICD-10-CM | POA: Insufficient documentation

## 2022-10-12 DIAGNOSIS — Z853 Personal history of malignant neoplasm of breast: Secondary | ICD-10-CM | POA: Diagnosis present

## 2022-10-12 DIAGNOSIS — Z95828 Presence of other vascular implants and grafts: Secondary | ICD-10-CM

## 2022-10-12 MED ORDER — SODIUM CHLORIDE 0.9% FLUSH
10.0000 mL | Freq: Once | INTRAVENOUS | Status: AC
  Administered 2022-10-12: 10 mL

## 2022-10-12 MED ORDER — HEPARIN SOD (PORK) LOCK FLUSH 100 UNIT/ML IV SOLN
500.0000 [IU] | Freq: Once | INTRAVENOUS | Status: AC
  Administered 2022-10-12: 500 [IU]

## 2022-10-25 ENCOUNTER — Other Ambulatory Visit: Payer: Self-pay | Admitting: *Deleted

## 2022-10-25 NOTE — Telephone Encounter (Signed)
Dr Earlie Counts office faxed an order for Hepatitis C antibody reflex to [facility]. Lab to place orders and will fax results

## 2022-11-29 ENCOUNTER — Other Ambulatory Visit: Payer: Self-pay | Admitting: *Deleted

## 2022-11-29 DIAGNOSIS — Z171 Estrogen receptor negative status [ER-]: Secondary | ICD-10-CM

## 2022-12-06 ENCOUNTER — Other Ambulatory Visit: Payer: Self-pay

## 2022-12-06 DIAGNOSIS — Z171 Estrogen receptor negative status [ER-]: Secondary | ICD-10-CM

## 2022-12-07 ENCOUNTER — Inpatient Hospital Stay: Payer: Medicare Other | Attending: Nurse Practitioner

## 2022-12-07 ENCOUNTER — Ambulatory Visit (HOSPITAL_COMMUNITY)
Admission: RE | Admit: 2022-12-07 | Discharge: 2022-12-07 | Disposition: A | Discharge status: Home / Self Care | Payer: Medicare Other | Source: Ambulatory Visit | Attending: Hematology | Admitting: Hematology

## 2022-12-07 ENCOUNTER — Encounter (HOSPITAL_COMMUNITY): Payer: Self-pay

## 2022-12-07 ENCOUNTER — Other Ambulatory Visit: Payer: Self-pay

## 2022-12-07 DIAGNOSIS — R5383 Other fatigue: Secondary | ICD-10-CM | POA: Diagnosis not present

## 2022-12-07 DIAGNOSIS — Z171 Estrogen receptor negative status [ER-]: Secondary | ICD-10-CM

## 2022-12-07 DIAGNOSIS — Z853 Personal history of malignant neoplasm of breast: Secondary | ICD-10-CM | POA: Diagnosis present

## 2022-12-07 DIAGNOSIS — Z95828 Presence of other vascular implants and grafts: Secondary | ICD-10-CM

## 2022-12-07 DIAGNOSIS — Z452 Encounter for adjustment and management of vascular access device: Secondary | ICD-10-CM | POA: Diagnosis not present

## 2022-12-07 DIAGNOSIS — C50412 Malignant neoplasm of upper-outer quadrant of left female breast: Secondary | ICD-10-CM | POA: Insufficient documentation

## 2022-12-07 DIAGNOSIS — Z08 Encounter for follow-up examination after completed treatment for malignant neoplasm: Secondary | ICD-10-CM | POA: Diagnosis present

## 2022-12-07 LAB — CBC WITH DIFFERENTIAL (CANCER CENTER ONLY)
Abs Immature Granulocytes: 0.01 10*3/uL (ref 0.00–0.07)
Basophils Absolute: 0 10*3/uL (ref 0.0–0.1)
Basophils Relative: 1 %
Eosinophils Absolute: 0.2 10*3/uL (ref 0.0–0.5)
Eosinophils Relative: 5 %
HCT: 39.5 % (ref 36.0–46.0)
Hemoglobin: 13.7 g/dL (ref 12.0–15.0)
Immature Granulocytes: 0 %
Lymphocytes Relative: 32 %
Lymphs Abs: 1.4 10*3/uL (ref 0.7–4.0)
MCH: 32 pg (ref 26.0–34.0)
MCHC: 34.7 g/dL (ref 30.0–36.0)
MCV: 92.3 fL (ref 80.0–100.0)
Monocytes Absolute: 0.4 10*3/uL (ref 0.1–1.0)
Monocytes Relative: 8 %
Neutro Abs: 2.4 10*3/uL (ref 1.7–7.7)
Neutrophils Relative %: 54 %
Platelet Count: 141 10*3/uL — ABNORMAL LOW (ref 150–400)
RBC: 4.28 MIL/uL (ref 3.87–5.11)
RDW: 12.9 % (ref 11.5–15.5)
WBC Count: 4.5 10*3/uL (ref 4.0–10.5)
nRBC: 0 % (ref 0.0–0.2)

## 2022-12-07 LAB — CMP (CANCER CENTER ONLY)
ALT: 14 U/L (ref 0–44)
AST: 31 U/L (ref 15–41)
Albumin: 4 g/dL (ref 3.5–5.0)
Alkaline Phosphatase: 63 U/L (ref 38–126)
Anion gap: 4 — ABNORMAL LOW (ref 5–15)
BUN: 14 mg/dL (ref 8–23)
CO2: 28 mmol/L (ref 22–32)
Calcium: 8.7 mg/dL — ABNORMAL LOW (ref 8.9–10.3)
Chloride: 109 mmol/L (ref 98–111)
Creatinine: 0.69 mg/dL (ref 0.44–1.00)
GFR, Estimated: >60 mL/min (ref >60)
Glucose, Bld: 104 mg/dL — ABNORMAL HIGH (ref 70–99)
Potassium: 4.2 mmol/L (ref 3.5–5.1)
Sodium: 141 mmol/L (ref 135–145)
Total Bilirubin: 0.3 mg/dL (ref 0.3–1.2)
Total Protein: 6 g/dL — ABNORMAL LOW (ref 6.5–8.1)

## 2022-12-07 MED ORDER — HEPARIN SOD (PORK) LOCK FLUSH 100 UNIT/ML IV SOLN
500.0000 [IU] | Freq: Once | INTRAVENOUS | Status: AC
  Administered 2022-12-07: 500 [IU] via INTRAVENOUS

## 2022-12-07 MED ORDER — HEPARIN SOD (PORK) LOCK FLUSH 100 UNIT/ML IV SOLN
INTRAVENOUS | Status: AC
  Filled 2022-12-07: qty 5

## 2022-12-07 MED ORDER — IOHEXOL 300 MG/ML  SOLN
75.0000 mL | Freq: Once | INTRAMUSCULAR | Status: AC | PRN
  Administered 2022-12-07: 75 mL via INTRAVENOUS

## 2022-12-07 MED ORDER — SODIUM CHLORIDE 0.9% FLUSH
10.0000 mL | Freq: Once | INTRAVENOUS | Status: AC
  Administered 2022-12-07: 10 mL

## 2022-12-07 MED ORDER — SODIUM CHLORIDE (PF) 0.9 % IJ SOLN
INTRAMUSCULAR | Status: AC
  Filled 2022-12-07: qty 50

## 2022-12-12 NOTE — Progress Notes (Signed)
Spoke w/Diane in the Reading Room at [facility] to expedite the read on pt's diagnostic imaging test.  Diane stated he would expedite the read.

## 2022-12-13 NOTE — Assessment & Plan Note (Signed)
Left breast Multifocal Metaplastic cancer, stage IIIC [id number], including one unresectable axillary node, metaplastic carcinoma, triple negative, grade 3 -diagnosed in 10/2019. CT CAP and bone scan 11/2019 were negative for distant metastasis.  -S/p left mastectomy with Dr Carolynne Edouard on 12/26/19. Surgical path showed: 6cm multifocal metaplastic carcinoma; 13/16 positive LN, one of which was not able to be removed due to vascular invasion.  -s/p adjuvant carbo/taxol 02/05/20 - 05/13/20 followed by Adriamycin/cytoxan 05/20/20 - 07/13/20 along with [place] Sep through 01/19/21.  She tolerated AC poorly with mucositis, weight loss, constipation, and weakness.  -interim left axillary [place] 06/17/20 is negative for adenopathy, c/w response to adjuvant chemo -s/p adjuvant radiation with Dr. Basilio Cairo 08/24/20 - 10/05/20 -restaging CT chest on 12/07/2022 showed no evidence of cancer recurrence, except for surgical and postradiation changes. -she will keep her port for now. She will continue to return for flush every 6-8 weeks.

## 2022-12-13 NOTE — Progress Notes (Unsigned)
[facility]   [phone] [phone]   Clinic Follow up Note   Patient Care Team: Pcp, No as PCP - General Pershing Proud, RN as Oncology Nurse Navigator Donnelly Angelica, RN as Oncology Nurse Navigator Griselda Miner, MD as Consulting Physician (General Surgery) Malachy Mood, MD as Consulting Physician (Hematology) Lonie Peak, MD as Attending Physician (Radiation Oncology) Pollyann Samples, NP as Nurse Practitioner (Nurse Practitioner)  Date of Service:  12/14/2022  CHIEF COMPLAINT: f/u of left breast cancer   CURRENT THERAPY:  Surveillance   ASSESSMENT:  Lindsay [name] is a 67 y.o. female with   Malignant neoplasm of upper-outer quadrant of left breast in female, estrogen receptor negative (HCC)  Left breast Multifocal Metaplastic cancer, stage IIIC pT3N3aM0, including one unresectable axillary node, metaplastic carcinoma, triple negative, grade 3 -diagnosed in 10/2019. CT CAP and bone scan 11/2019 were negative for distant metastasis.  -S/p left mastectomy with Dr Carolynne Edouard on 12/26/19. Surgical path showed: 6cm multifocal metaplastic carcinoma; 13/16 positive LN, one of which was not able to be removed due to vascular invasion.  -s/p adjuvant carbo/taxol 02/05/20 - 05/13/20 followed by Adriamycin/cytoxan 05/20/20 - 07/13/20 along with Dom[place] Sep through 01/19/21.  She tolerated AC poorly with mucositis, weight loss, constipation, and weakness.  -interim left axillary [place] 06/17/20 is negative for adenopathy, c/w response to adjuvant chemo -s/p adjuvant radiation with Dr. Basilio Cairo 08/24/20 - 10/05/20 -restaging CT chest on 12/07/2022 showed no evidence of cancer recurrence, except for surgical and postradiation changes. -she will keep her port for now. She will continue to return for flush every 6-8 weeks. -she is overall doing well, but flustrated due to her fagitue  Fatigue  -chronic fatigue since her cancer treatment -body pain, possible related to previous  chemo and immunotherapy -we discussed diet and exercise and avoid overdoing things  -will obtain echo to rule out chemo induced cardiomyopathy   PLAN: -echo in next few weeks -port flush in 8 weeks -lab, flush, and f/u in 16 weeks -will repeat CT in a year    SUMMARY OF ONCOLOGIC HISTORY: Oncology History Overview Note  Cancer Staging Malignant neoplasm of upper-outer quadrant of left breast in female, estrogen receptor negative (HCC) Staging form: Breast, AJCC 8th Edition - Clinical stage from 10/28/2019: Stage IIIB (cT2, cN1, cM0, G3, ER-, PR-, HER2-) - Signed by Lonie Peak, MD on 11/05/2019 - Pathologic stage from 12/26/2019: Stage IIIC (pT3, pN3a, cM0, G3, ER-, PR-, HER2-) - Signed by Malachy Mood, MD on 01/28/2020    Malignant neoplasm of upper-outer quadrant of left breast in female, estrogen receptor negative (HCC)  10/27/2019 Mammogram   IMPRESSION: 1. Right breast calcifications spanning 2.6 cm in the upper slightly medial breast are indeterminate.   2. Left breast dominant mass at 3 o'clock, 6 cm from nipple measuring 3x3.9x2.9 cm is highly suspicious. There is overlying skin thickening, skin involvement is not excluded.   3. Left breast mass/distorted tissue at 1 o'clock, 4cm from nipple measuring 3.0x1.2x2.9 cm is likely in contiguity with the dominant mass at 3 o'clock and is also highly suspicious. With the dominant lesion the overall span a disease is approximately 6 cm.   4. Left breast distortion at 2 o'clock 10 cm from the nipple is Suspicious, measuring 1.0 x 0.8 cm.   5.  Abnormal left axillary lymph nodes (2). Cortical thickness measures up to 0.6 cm.    10/28/2019 Initial Biopsy   Diagnosis 1. Breast, left, needle core biopsy, 3  o'clock, 5cmfn - INVASIVE MAMMARY CARCINOMA. 2. Breast, left, needle core biopsy, 2 o'clock, 10cmfn - INVASIVE MAMMARY CARCINOMA. 3. Lymph node, needle/core biopsy, left axilla - METASTATIC CARCINOMA IN (1) OF (1) LYMPH  NODE. Microscopic Comment 1. -3. Overall, immunohistochemistry favors a pronounced desmoplastic response, but metaplastic carcinoma cannot be excluded (Epithelioid component: CKAE1AE3 strong +, CK5/6 weak +). The greatest linear extent of tumor in any one core in specimen 1 is 14 mm. The greatest linear extent of tumor in any one core in specimen 2 is 16 mm.   10/28/2019 Receptors her2   3. PROGNOSTIC INDICATORS Results: IMMUNOHISTOCHEMICAL AND MORPHOMETRIC ANALYSIS PERFORMED MANUALLY The tumor cells are EQUIVOCAL for Her2 (2+). Her2 by FISH will be performed and results reported separately. Estrogen Receptor: 0%, NEGATIVE Progesterone Receptor: 0%, NEGATIVE Proliferation Marker Ki67: 10%    3. FLUORESCENCE IN-SITU HYBRIDIZATION Results: GROUP 5: HER2 **NEGATIVE** Equivocal form of amplification of the HER2 gene was detected in the IHC 2+ tissue sample received from this individual. HER2 FISH was performed by a technologist and cell imaging and analysis on the [employer].   10/28/2019 Cancer Staging   Staging form: Breast, AJCC 8th Edition - Clinical stage from 10/28/2019: Stage IIIB (cT2, cN1, cM0, G3, ER-, PR-, HER2-) - Signed by Lonie Peak, MD on 11/05/2019   10/31/2019 Initial Diagnosis   Malignant neoplasm of upper-outer quadrant of left breast in female, estrogen receptor negative (HCC)   10/31/2019 Pathology Results   Diagnosis Breast, right, needle core biopsy, UOQ, posterior - FOCAL USUAL DUCTAL HYPERPLASIA AND FIBROCYSTIC CHANGES WITH CALCIFICATIONS - FIBROADENOMATOID CHANGES - NO MALIGNANCY IDENTIFIED Microscopic Comment These results were called to [facility] on November 03, 2019.   11/05/2019 Genetic Testing   She declined Genetic testing    11/21/2019 Imaging   CT CAP w contrast  IMPRESSION: 1. Left breast mass and prominent left axillary lymph nodes, containing biopsy marking clips, consistent with newly diagnosed breast malignancy. 2. No  definite evidence of distant metastatic disease in the chest, abdomen, or pelvis. 3. There are occasional small pulmonary nodules, measuring 2-3 mm, most likely incidental sequelae of prior infection or inflammation although metastatic disease is not excluded. Attention on follow-up. 4. Hepatic steatosis. 5. Aortic Atherosclerosis (ICD10-I70.0).     11/21/2019 Imaging   Bone Scan Whole Body IMPRESSION: 1. Single focus of uptake associated with a subacute fracture of LEFT anterior fourth rib. 2. No additional areas of abnormal uptake.   12/26/2019 Cancer Staging   Staging form: Breast, AJCC 8th Edition - Pathologic stage from 12/26/2019: Stage IIIC (pT3, pN3a, cM0, G3, ER-, PR-, HER2-) - Signed by Malachy Mood, MD on 01/28/2020   12/26/2019 Surgery   LEFT MASTECTOMY MODIFIED RADICAL and PAC Placement by Dr Carolynne Edouard   12/26/2019 Pathology Results   FINAL MICROSCOPIC DIAGNOSIS:   A. 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  - Ductal carcinoma in situ, high nuclear grade with central necrosis.  - Margins of resection:  - Metaplastic carcinoma focally involves the anterior margin and is < 1  mm from the posterior margin.  - DCIS is < 1 mm from the anterior margin.  - Metastatic carcinoma in (13) of (16) lymph nodes with extranodal  extension.  - Biopsy clip sites in breast and one lymph node.  - See oncology table.    ADDENDUM:  PROGNOSTIC INDICATOR RESULTS:  Immunohistochemical and morphometric analysis performed manually  The tumor  cells are EQUIVOCAL for Her2 (2+). Her2 FISH has been ordered  and will be reported in an addendum.  Estrogen Receptor:       NEGATIVE  Progesterone Receptor:   NEGATIVE  Proliferation Marker Ki-67:   30%    ADDENDUM:  FLOURESCENCE IN-SITU HYBRIDIZATION RESULTS:  GROUP 5:   HER2 NEGATIVE   01/28/2020 Echocardiogram   Baseline Echo  IMPRESSIONS     1. Left  ventricular ejection fraction, by estimation, is 60 to 65%. The  left ventricle has normal function. The left ventricle has no regional  wall motion abnormalities. Left ventricular diastolic parameters are  consistent with Grade I diastolic  dysfunction (impaired relaxation). The average left ventricular global  longitudinal strain is -19.8 %.   2. Right ventricular systolic function is normal. The right ventricular  size is normal. Tricuspid regurgitation signal is inadequate for assessing  PA pressure.   3. The mitral valve is normal in structure. No evidence of mitral valve  regurgitation. No evidence of mitral stenosis.   4. The aortic valve is normal in structure. Aortic valve regurgitation is  not visualized. No aortic stenosis is present.   5. The inferior vena cava is normal in size with greater than 50%  respiratory variability, suggesting right atrial pressure of 3 mmHg.    02/05/2020 - 01/19/2021 Chemotherapy   Keytruda q3weeks (to be taken for 1 whole year) with weekly Carboplatin/Taxol for 12 weeks starting 02/05/20-05/13/20 followed by Adriamycin/Cytoxan q2weeks X4 starting 05/20/20-07/13/20.  ----Continue Keytruda q3weeks from 08/03/20 to complete 1 year of treatment from 02/05/20).    06/17/2020 Breast US   FINDINGS: On physical exam,well-healed scars of LEFT mastectomy and LEFT axillary node dissection. I palpate no axillary mass. Ultrasound is performed, showing normal appearing LEFT axillary contents. No mass or enlarged lymph nodes. IMPRESSION: Ultrasound is negative for LEFT axillary adenopathy.   08/24/2020 - 10/05/2020 Radiation Therapy   Adjuvant RT per Dr. Basilio Cairo starting 08/24/20-10/05/20   04/14/2021 Survivorship   SCP delivered by Santiago Glad, NP      INTERVAL HISTORY:  Lindsay [name] is here for a follow up of left breast cancer . She was last seen by me on 08/31/22. She presents to the clinic alone.  Still feel fatigued, not able to do a lot She has leg cramps,  muscular ashiness, and body pain, intermittent  No GI symptoms  Chronic right leg edema, mild  Weight stable  She is every frustrated +    All other systems were reviewed with the patient and are negative.  MEDICAL HISTORY:  Past Medical History:  Diagnosis Date   Breast CA (HCC)    Bronchitis    Family history of bone cancer    Family history of breast cancer    Family history of prostate cancer    Fibromyalgia    HCV (hepatitis C virus)    MRSA (methicillin resistant Staphylococcus aureus) 2012   Shingles     SURGICAL HISTORY: Past Surgical History:  Procedure Laterality Date   MASTECTOMY MODIFIED RADICAL Left 12/26/2019   Procedure: LEFT MASTECTOMY MODIFIED RADICAL;  Surgeon: Griselda Miner, MD;  Location: [facility] OR;  Service: General;  Laterality: Left;  PEC BLOCK   PORTACATH PLACEMENT Right 12/26/2019   Procedure: INSERTION PORT-A-CATH WITH ULTRASOUND GUIDANCE;  Surgeon: Griselda Miner, MD;  Location: [facility] OR;  Service: General;  Laterality: Right;    I have reviewed the social history and family history with the patient and they are unchanged from previous note.  ALLERGIES:  is allergic to codeine.  MEDICATIONS:  Current Outpatient Medications  Medication Sig Dispense Refill   acetaminophen (TYLENOL) 650 MG CR tablet Take 650 mg by mouth every 8 (eight) hours as needed for pain.      ascorbic acid (VITAMIN C) 250 MG CHEW Chew by mouth.     benzonatate (TESSALON) 200 MG capsule Take 1 capsule (200 mg total) by mouth 3 (three) times daily as needed for cough. 20 capsule 0   dexamethasone (DECADRON) 0.5 MG/5ML solution Take 10 ml (1 mg) swish for 2 minutes and then spit.  Do this four times daily as needed Do Not eat or drink for 1 hour after. 240 mL 0   diphenoxylate-atropine (LOMOTIL) 2.5-0.025 MG tablet Take 1-2 tablets by mouth 4 (four) times daily as needed for diarrhea or loose stools. 30 tablet 1   doxycycline (VIBRA-TABS) 100 MG tablet Take 1 tablet (100 mg total) by  mouth 2 (two) times daily. 14 tablet 0   gabapentin (NEURONTIN) 300 MG capsule Take 300 mg by mouth 3 (three) times daily as needed.     levofloxacin (LEVAQUIN) 500 MG tablet Take 1 tablet (500 mg total) by mouth daily. 7 tablet 0   lidocaine (XYLOCAINE) 2 % solution Use as directed 15 mLs in the mouth or throat every 4 (four) hours as needed for mouth pain. 100 mL 2   lidocaine-prilocaine (EMLA) cream Apply 1 Application topically as needed. 30 g 2   methocarbamol (ROBAXIN) 750 MG tablet Take 1 tablet (750 mg total) by mouth 4 (four) times daily as needed (use for muscle cramps/pain). 30 tablet 2   mucosal barrier oral (GELCLAIR) GEL Take 1 packet by mouth as needed.     omeprazole (PRILOSEC) 20 MG capsule Take 1 capsule (20 mg total) by mouth daily. 30 capsule 2   valACYclovir (VALTREX) 1000 MG tablet Take 1 tablet (1,000 mg total) by mouth 2 (two) times daily. 14 tablet 1   No current facility-administered medications for this visit.    PHYSICAL EXAMINATION: ECOG PERFORMANCE STATUS: 1  Vitals:   12/14/22 1200  BP: 130/76  Pulse: 64  Resp: 16  Temp: 97.7 F (36.5 C)  SpO2: 96%    Wt Readings from Last 3 Encounters:  12/14/22 173 lb 12.8 oz (78.8 kg)  08/31/22 171 lb 3.2 oz (77.7 kg)  05/25/22 168 lb 14.4 oz (76.6 kg)    GENERAL:alert, no distress and comfortable SKIN: skin color, texture, turgor are normal, no rashes or significant lesions EYES:(-)  normal, Conjunctiva are pink and non-injected, sclera clear NECK: supple, thyroid normal size, non-tender, without nodularity LYMPH:(-)   no palpable lymphadenopathy in the cervical, axillary  LUNGS:(+)  scatter b/l rhonchi, no wheezing HEART: (-) regular rate & rhythm and no murmurs and no lower extremity edema ABDOMEN:(-) abdomen soft, non-tender and normal bowel sounds Breast: Rt breast  no palpable mass, breast exam benign, Lt breast Mastectomy no palpable mass breast exam benign.   LABORATORY DATA:  I have reviewed the  data as listed    Latest Ref Rng & Units 12/07/2022   10:22 AM 08/31/2022   10:03 AM 07/06/2022    9:47 AM  CBC  WBC 4.0 - 10.5 K/uL 4.5  8.1  6.2   Hemoglobin 12.0 - 15.0 g/dL 16.1  09.6  04.5   Hematocrit 36.0 - 46.0 % 39.5  40.0  36.8   Platelets 150 - 400 K/uL 141  147  143  Latest Ref Rng & Units 12/07/2022   10:22 AM 08/31/2022   10:03 AM 07/06/2022    9:47 AM  CMP  Glucose 70 - 99 mg/dL 160  109  94   BUN 8 - 23 mg/dL 14  9  14    Creatinine 0.44 - 1.00 mg/dL 3.23  5.57  3.22   Sodium 135 - 145 mmol/L 141  135  135   Potassium 3.5 - 5.1 mmol/L 4.2  3.7  4.0   Chloride 98 - 111 mmol/L 109  101  103   CO2 22 - 32 mmol/L 28  29  28    Calcium 8.9 - 10.3 mg/dL 8.7  9.2  8.3   Total Protein 6.5 - 8.1 g/dL 6.0  6.3  5.6   Total Bilirubin 0.3 - 1.2 mg/dL 0.3  0.5  0.4   Alkaline Phos 38 - 126 U/L 63  67  71   AST 15 - 41 U/L 31  27  35   ALT 0 - 44 U/L 14  13  16        RADIOGRAPHIC STUDIES: I have personally reviewed the radiological images as listed and agreed with the findings in the report. No results found.    Orders Placed This Encounter  Procedures   ECHOCARDIOGRAM COMPLETE    Chronic fatigue since chemo, pt received adramycin before    Standing Status:   Future    Standing Expiration Date:   12/14/2023    Order Specific Question:   Where should this test be performed    Answer:   [facility]    Order Specific Question:   Perflutren DEFINITY (image enhancing agent) should be administered unless hypersensitivity or allergy exist    Answer:   Administer Perflutren    Order Specific Question:   Is a special reader required? (athlete or structural heart)    Answer:   No    Order Specific Question:   Does this study need to be read by the Structural team/Level 3 readers?    Answer:   No    Order Specific Question:   Reason for exam-Echo    Answer:   Chemo  Z09   All questions were answered. The patient knows to call the clinic with any problems, questions or  concerns. No barriers to learning was detected. The total time spent in the appointment was 25 minutes.     Malachy Mood, MD 12/14/2022

## 2022-12-14 ENCOUNTER — Encounter: Payer: Self-pay | Admitting: Hematology

## 2022-12-14 ENCOUNTER — Other Ambulatory Visit: Payer: Self-pay

## 2022-12-14 ENCOUNTER — Inpatient Hospital Stay (HOSPITAL_BASED_OUTPATIENT_CLINIC_OR_DEPARTMENT_OTHER): Payer: Medicare Other | Admitting: Hematology

## 2022-12-14 VITALS — BP 130/76 | HR 64 | Temp 97.7°F | Resp 16 | Ht 61.0 in | Wt 173.8 lb

## 2022-12-14 DIAGNOSIS — I10 Essential (primary) hypertension: Secondary | ICD-10-CM

## 2022-12-14 DIAGNOSIS — Z171 Estrogen receptor negative status [ER-]: Secondary | ICD-10-CM | POA: Diagnosis not present

## 2022-12-14 DIAGNOSIS — Z08 Encounter for follow-up examination after completed treatment for malignant neoplasm: Secondary | ICD-10-CM | POA: Diagnosis not present

## 2022-12-14 DIAGNOSIS — C50412 Malignant neoplasm of upper-outer quadrant of left female breast: Secondary | ICD-10-CM

## 2022-12-28 ENCOUNTER — Other Ambulatory Visit: Payer: Self-pay

## 2022-12-28 ENCOUNTER — Emergency Department (HOSPITAL_BASED_OUTPATIENT_CLINIC_OR_DEPARTMENT_OTHER)
Admission: EM | Admit: 2022-12-28 | Discharge: 2022-12-29 | Disposition: A | Discharge status: Home / Self Care | Payer: Medicare Other | Attending: Emergency Medicine | Admitting: Emergency Medicine

## 2022-12-28 ENCOUNTER — Encounter (HOSPITAL_BASED_OUTPATIENT_CLINIC_OR_DEPARTMENT_OTHER): Payer: Self-pay

## 2022-12-28 DIAGNOSIS — Z20822 Contact with and (suspected) exposure to covid-19: Secondary | ICD-10-CM | POA: Insufficient documentation

## 2022-12-28 DIAGNOSIS — E871 Hypo-osmolality and hyponatremia: Secondary | ICD-10-CM | POA: Diagnosis not present

## 2022-12-28 DIAGNOSIS — Z859 Personal history of malignant neoplasm, unspecified: Secondary | ICD-10-CM | POA: Insufficient documentation

## 2022-12-28 DIAGNOSIS — R5383 Other fatigue: Secondary | ICD-10-CM

## 2022-12-28 LAB — CBC WITH DIFFERENTIAL/PLATELET
Abs Immature Granulocytes: 0.01 10*3/uL (ref 0.00–0.07)
Basophils Absolute: 0 10*3/uL (ref 0.0–0.1)
Basophils Relative: 0 %
Eosinophils Absolute: 0 10*3/uL (ref 0.0–0.5)
Eosinophils Relative: 0 %
HCT: 38.5 % (ref 36.0–46.0)
Hemoglobin: 13.9 g/dL (ref 12.0–15.0)
Immature Granulocytes: 0 %
Lymphocytes Relative: 11 %
Lymphs Abs: 0.3 10*3/uL — ABNORMAL LOW (ref 0.7–4.0)
MCH: 32 pg (ref 26.0–34.0)
MCHC: 36.1 g/dL — ABNORMAL HIGH (ref 30.0–36.0)
MCV: 88.5 fL (ref 80.0–100.0)
Monocytes Absolute: 0.1 10*3/uL (ref 0.1–1.0)
Monocytes Relative: 4 %
Neutro Abs: 2.3 10*3/uL (ref 1.7–7.7)
Neutrophils Relative %: 85 %
Platelets: 110 10*3/uL — ABNORMAL LOW (ref 150–400)
RBC: 4.35 MIL/uL (ref 3.87–5.11)
RDW: 12.6 % (ref 11.5–15.5)
WBC: 2.7 10*3/uL — ABNORMAL LOW (ref 4.0–10.5)
nRBC: 0 % (ref 0.0–0.2)

## 2022-12-28 LAB — COMPREHENSIVE METABOLIC PANEL
ALT: 38 U/L (ref 0–44)
AST: 139 U/L — ABNORMAL HIGH (ref 15–41)
Albumin: 3.4 g/dL — ABNORMAL LOW (ref 3.5–5.0)
Alkaline Phosphatase: 163 U/L — ABNORMAL HIGH (ref 38–126)
Anion gap: 10 (ref 5–15)
BUN: 23 mg/dL (ref 8–23)
CO2: 22 mmol/L (ref 22–32)
Calcium: 8.5 mg/dL — ABNORMAL LOW (ref 8.9–10.3)
Chloride: 93 mmol/L — ABNORMAL LOW (ref 98–111)
Creatinine, Ser: 0.99 mg/dL (ref 0.44–1.00)
GFR, Estimated: >60 mL/min (ref >60)
Glucose, Bld: 115 mg/dL — ABNORMAL HIGH (ref 70–99)
Potassium: 3.7 mmol/L (ref 3.5–5.1)
Sodium: 125 mmol/L — ABNORMAL LOW (ref 135–145)
Total Bilirubin: 0.7 mg/dL (ref 0.3–1.2)
Total Protein: 5.4 g/dL — ABNORMAL LOW (ref 6.5–8.1)

## 2022-12-28 LAB — URINALYSIS, ROUTINE W REFLEX MICROSCOPIC
Bilirubin Urine: NEGATIVE
Glucose, UA: NEGATIVE mg/dL
Hgb urine dipstick: NEGATIVE
Ketones, ur: NEGATIVE mg/dL
Leukocytes,Ua: NEGATIVE
Nitrite: NEGATIVE
Protein, ur: 30 mg/dL — AB
Specific Gravity, Urine: 1.028 (ref 1.005–1.030)
pH: 6 (ref 5.0–8.0)

## 2022-12-28 LAB — SARS CORONAVIRUS 2 BY RT PCR: SARS Coronavirus 2 by RT PCR: NEGATIVE

## 2022-12-28 MED ORDER — SODIUM CHLORIDE 0.9 % IV BOLUS
1000.0000 mL | Freq: Once | INTRAVENOUS | Status: AC
  Administered 2022-12-28: 1000 mL via INTRAVENOUS

## 2022-12-28 NOTE — ED Triage Notes (Signed)
Pt c/o "extreme" sweating, nausea, poor PO/ appetite, fatigue SHOB onset Saturday 2 neg covid tests at home.

## 2022-12-28 NOTE — ED Provider Notes (Signed)
[place] EMERGENCY DEPARTMENT AT [facility] Provider Note   CSN: [id number] Arrival date & time: 12/28/22  1826     History  Chief Complaint  Patient presents with   URI    Lindsay [name] is a 67 y.o. female.  Patient to ED with symptoms of extreme fatigue, nausea and night sweats for the past several days. No known fever. She denies cough, congestion, SOB. No vomiting. She has not felt like eating and has poor PO intake. She reports history of cancer s/p chemo, interstitial lung disease, hepatitis C.   The history is provided by the patient and a friend. No language interpreter was used.  URI      Home Medications Prior to Admission medications   Medication Sig Start Date End Date Taking? Authorizing Provider  acetaminophen (TYLENOL) 650 MG CR tablet Take 650 mg by mouth every 8 (eight) hours as needed for pain.     [provider]  ascorbic acid (VITAMIN C) 250 MG CHEW Chew by mouth.    [provider]  benzonatate (TESSALON) 200 MG capsule Take 1 capsule (200 mg total) by mouth 3 (three) times daily as needed for cough. 09/07/22   Malachy Mood, MD  dexamethasone (DECADRON) 0.5 MG/5ML solution Take 10 ml (1 mg) swish for 2 minutes and then spit.  Do this four times daily as needed Do Not eat or drink for 1 hour after. 09/30/20   Pollyann Samples, NP  diphenoxylate-atropine (LOMOTIL) 2.5-0.025 MG tablet Take 1-2 tablets by mouth 4 (four) times daily as needed for diarrhea or loose stools. 02/19/20   Malachy Mood, MD  doxycycline (VIBRA-TABS) 100 MG tablet Take 1 tablet (100 mg total) by mouth 2 (two) times daily. 07/21/21   Pollyann Samples, NP  gabapentin (NEURONTIN) 300 MG capsule Take 300 mg by mouth 3 (three) times daily as needed. 01/22/20   [provider]  levofloxacin (LEVAQUIN) 500 MG tablet Take 1 tablet (500 mg total) by mouth daily. 09/07/22   Malachy Mood, MD  lidocaine (XYLOCAINE) 2 % solution Use as directed 15 mLs in the mouth or throat every  4 (four) hours as needed for mouth pain. 06/11/20   Malachy Mood, MD  lidocaine-prilocaine (EMLA) cream Apply 1 Application topically as needed. 05/23/22   Malachy Mood, MD  methocarbamol (ROBAXIN) 750 MG tablet Take 1 tablet (750 mg total) by mouth 4 (four) times daily as needed (use for muscle cramps/pain). 12/26/19   Griselda Miner, MD  mucosal barrier oral (GELCLAIR) GEL Take 1 packet by mouth as needed.    [provider]  omeprazole (PRILOSEC) 20 MG capsule Take 1 capsule (20 mg total) by mouth daily. 02/12/20   Malachy Mood, MD  valACYclovir (VALTREX) 1000 MG tablet Take 1 tablet (1,000 mg total) by mouth 2 (two) times daily. 06/11/20   Malachy Mood, MD  prochlorperazine (COMPAZINE) 10 MG tablet Take 1 tablet (10 mg total) by mouth every 6 (six) hours as needed (Nausea or vomiting). 01/28/20 08/03/20  Malachy Mood, MD      Allergies    Codeine    Review of Systems   Review of Systems  Physical Exam Updated Vital Signs BP 96/61   Pulse 97   Temp 98 F (36.7 C)   Resp 18   SpO2 97%  Physical Exam Vitals and nursing note reviewed.  Constitutional:      Appearance: She is not ill-appearing or diaphoretic.  HENT:     Head: Normocephalic.  Mouth/Throat:     Mouth: Mucous membranes are moist.  Eyes:     Conjunctiva/sclera: Conjunctivae normal.  Cardiovascular:     Rate and Rhythm: Normal rate and regular rhythm.     Heart sounds: No murmur heard. Pulmonary:     Effort: Pulmonary effort is normal.     Breath sounds: Normal breath sounds. No wheezing or rhonchi.  Abdominal:     General: There is no distension.     Palpations: Abdomen is soft.     Tenderness: There is no abdominal tenderness.  Musculoskeletal:        General: Normal range of motion.     Cervical back: Normal range of motion and neck supple.     Left lower leg: No edema.  Skin:    General: Skin is warm and dry.  Neurological:     Mental Status: She is alert and oriented to person, place, and time.     ED Results  / Procedures / Treatments   Labs (all labs ordered are listed, but only abnormal results are displayed) Labs Reviewed  CBC WITH DIFFERENTIAL/PLATELET - Abnormal; Notable for the following components:      Result Value   WBC 2.7 (*)    MCHC 36.1 (*)    Platelets 110 (*)    Lymphs Abs 0.3 (*)    All other components within normal limits  COMPREHENSIVE METABOLIC PANEL - Abnormal; Notable for the following components:   Sodium 125 (*)    Chloride 93 (*)    Glucose, Bld 115 (*)    Calcium 8.5 (*)    Total Protein 5.4 (*)    Albumin 3.4 (*)    AST 139 (*)    Alkaline Phosphatase 163 (*)    All other components within normal limits  URINALYSIS, ROUTINE W REFLEX MICROSCOPIC - Abnormal; Notable for the following components:   APPearance HAZY (*)    Protein, ur 30 (*)    Bacteria, UA RARE (*)    All other components within normal limits  SARS CORONAVIRUS 2 BY RT PCR    EKG None  Radiology No results found.  Procedures Procedures    Medications Ordered in ED Medications  sodium chloride 0.9 % bolus 1,000 mL (0 mLs Intravenous Stopped 12/28/22 2314)    ED Course/ Medical Decision Making/ A&P Clinical Course as of 12/29/22 0024  Thu Dec 28, 2022  2152 Patient with symptoms of extreme fatigue and significant night sweats without cough or fever. Neg COVID test at home and here. Blood pressure is low, not usual for her. Will obtain labs, provide IV fluids.  [SU]  Fri Dec 29, 2022  0003 Multiple noncritical lab abnormalities, reviewed with Dr. Kirtland Bouchard. [place]. BP mildly improved after fluids. Will obtain ECG and orthostatics.  [SU]  0020 No concerning ECG changes. No orthostatic hypotension, patient asymptomatic on evaluation. As discussed with Dr. Wilkie Aye, patient can be discharged home with close PCP recheck/follow up. Patient is felt reliable for f/u and to return to ED with any concerning changes.  [SU]    Clinical Course User Index [SU] Elpidio Anis, PA-C                                  Medical Decision Making Amount and/or Complexity of Data Reviewed Labs: ordered.           Final Clinical Impression(s) / ED Diagnoses Final diagnoses:  Other fatigue  Hyponatremia  Rx / DC Orders ED Discharge Orders     None         Elpidio Anis, PA-C 12/29/22 0024    Rozelle Logan, DO 01/06/23 1555

## 2022-12-29 MED ORDER — HEPARIN SOD (PORK) LOCK FLUSH 100 UNIT/ML IV SOLN
500.0000 [IU] | Freq: Once | INTRAVENOUS | Status: DC
  Filled 2022-12-29: qty 5

## 2022-12-29 NOTE — ED Notes (Signed)
Port de-accessed. Heparin flushed. Pt verbalized understanding of d/c instructions, meds, and followup care. Denies questions. VSS, no distress noted. Steady gait to exit with all belongings.

## 2022-12-29 NOTE — Discharge Instructions (Addendum)
Please call your doctor in the morning to schedule close in-office follow up for recheck of symptoms and repeat lab studies.   Return to the ED with any worsening symptoms or new concerns.

## 2023-01-01 ENCOUNTER — Other Ambulatory Visit: Payer: Self-pay

## 2023-01-01 ENCOUNTER — Telehealth: Payer: Self-pay | Admitting: Hematology

## 2023-01-01 ENCOUNTER — Telehealth: Payer: Self-pay

## 2023-01-01 NOTE — Telephone Encounter (Signed)
Pt called in to make the office aware of her recent visit to the ED  on 8/22 due to being dehydration. She was very adamant about her Sodium level being very low. She was instructed to take a sodium supplement and then have her lab rechecked in 2-3 weeks. Pt wanted to talk to Dr. Mosetta Putt and was very adamant about speaking with her as well. I was able to get her message to the NP Lacie . I let the pt know that I would leave Dr. Mosetta Putt a message to get in touch with her as well. Pt verbalized understanding.    Sharol Given, CMA

## 2023-01-11 ENCOUNTER — Inpatient Hospital Stay: Payer: Medicare Other | Attending: Nurse Practitioner

## 2023-01-11 ENCOUNTER — Telehealth: Payer: Self-pay

## 2023-01-11 DIAGNOSIS — Z171 Estrogen receptor negative status [ER-]: Secondary | ICD-10-CM | POA: Diagnosis not present

## 2023-01-11 DIAGNOSIS — I10 Essential (primary) hypertension: Secondary | ICD-10-CM | POA: Diagnosis not present

## 2023-01-11 DIAGNOSIS — C50412 Malignant neoplasm of upper-outer quadrant of left female breast: Secondary | ICD-10-CM | POA: Insufficient documentation

## 2023-01-11 DIAGNOSIS — Z9012 Acquired absence of left breast and nipple: Secondary | ICD-10-CM | POA: Insufficient documentation

## 2023-01-11 DIAGNOSIS — Z452 Encounter for adjustment and management of vascular access device: Secondary | ICD-10-CM | POA: Insufficient documentation

## 2023-01-11 DIAGNOSIS — Z853 Personal history of malignant neoplasm of breast: Secondary | ICD-10-CM | POA: Insufficient documentation

## 2023-01-11 DIAGNOSIS — R5382 Chronic fatigue, unspecified: Secondary | ICD-10-CM | POA: Diagnosis not present

## 2023-01-11 DIAGNOSIS — Z95828 Presence of other vascular implants and grafts: Secondary | ICD-10-CM

## 2023-01-11 LAB — CMP (CANCER CENTER ONLY)
ALT: 20 U/L (ref 0–44)
AST: 54 U/L — ABNORMAL HIGH (ref 15–41)
Albumin: 3.8 g/dL (ref 3.5–5.0)
Alkaline Phosphatase: 193 U/L — ABNORMAL HIGH (ref 38–126)
Anion gap: 6 (ref 5–15)
BUN: 11 mg/dL (ref 8–23)
CO2: 28 mmol/L (ref 22–32)
Calcium: 9 mg/dL (ref 8.9–10.3)
Chloride: 104 mmol/L (ref 98–111)
Creatinine: 0.6 mg/dL (ref 0.44–1.00)
GFR, Estimated: >60 mL/min (ref >60)
Glucose, Bld: 98 mg/dL (ref 70–99)
Potassium: 4.1 mmol/L (ref 3.5–5.1)
Sodium: 138 mmol/L (ref 135–145)
Total Bilirubin: 0.5 mg/dL (ref 0.3–1.2)
Total Protein: 6 g/dL — ABNORMAL LOW (ref 6.5–8.1)

## 2023-01-11 LAB — CBC WITH DIFFERENTIAL (CANCER CENTER ONLY)
Abs Immature Granulocytes: 0 10*3/uL (ref 0.00–0.07)
Basophils Absolute: 0.1 10*3/uL (ref 0.0–0.1)
Basophils Relative: 1 %
Eosinophils Absolute: 0.1 10*3/uL (ref 0.0–0.5)
Eosinophils Relative: 3 %
HCT: 37.1 % (ref 36.0–46.0)
Hemoglobin: 12.4 g/dL (ref 12.0–15.0)
Immature Granulocytes: 0 %
Lymphocytes Relative: 31 %
Lymphs Abs: 1.1 10*3/uL (ref 0.7–4.0)
MCH: 31.8 pg (ref 26.0–34.0)
MCHC: 33.4 g/dL (ref 30.0–36.0)
MCV: 95.1 fL (ref 80.0–100.0)
Monocytes Absolute: 0.5 10*3/uL (ref 0.1–1.0)
Monocytes Relative: 13 %
Neutro Abs: 1.8 10*3/uL (ref 1.7–7.7)
Neutrophils Relative %: 52 %
Platelet Count: 215 10*3/uL (ref 150–400)
RBC: 3.9 MIL/uL (ref 3.87–5.11)
RDW: 13.4 % (ref 11.5–15.5)
WBC Count: 3.5 10*3/uL — ABNORMAL LOW (ref 4.0–10.5)
nRBC: 0 % (ref 0.0–0.2)

## 2023-01-11 MED ORDER — HEPARIN SOD (PORK) LOCK FLUSH 100 UNIT/ML IV SOLN
500.0000 [IU] | Freq: Once | INTRAVENOUS | Status: AC
  Administered 2023-01-11: 500 [IU]

## 2023-01-11 MED ORDER — SODIUM CHLORIDE 0.9% FLUSH
10.0000 mL | Freq: Once | INTRAVENOUS | Status: AC
  Administered 2023-01-11: 10 mL

## 2023-01-11 NOTE — Telephone Encounter (Addendum)
Called patient and relayed the message below as per Vincent Gros NP. Patient voiced full understanding.   ----- Message from Carlean Jews sent at 01/11/2023  1:34 PM EDT ----- Please let the patient know that I have reviewed her labs. In general, they are improved and stable. We will continue to monitor them as scheduled.  Thank you.  Herbert Seta

## 2023-01-18 ENCOUNTER — Ambulatory Visit (HOSPITAL_COMMUNITY): Payer: Medicare Other

## 2023-01-25 ENCOUNTER — Ambulatory Visit (HOSPITAL_COMMUNITY): Payer: Medicare Other

## 2023-02-01 ENCOUNTER — Inpatient Hospital Stay: Payer: Medicare Other

## 2023-02-01 ENCOUNTER — Ambulatory Visit (HOSPITAL_BASED_OUTPATIENT_CLINIC_OR_DEPARTMENT_OTHER)
Admission: RE | Admit: 2023-02-01 | Discharge: 2023-02-01 | Disposition: A | Discharge status: Home / Self Care | Payer: Medicare Other | Source: Ambulatory Visit | Attending: Hematology | Admitting: Hematology

## 2023-02-01 DIAGNOSIS — C50412 Malignant neoplasm of upper-outer quadrant of left female breast: Secondary | ICD-10-CM | POA: Insufficient documentation

## 2023-02-01 DIAGNOSIS — Z95828 Presence of other vascular implants and grafts: Secondary | ICD-10-CM

## 2023-02-01 DIAGNOSIS — Z171 Estrogen receptor negative status [ER-]: Secondary | ICD-10-CM | POA: Insufficient documentation

## 2023-02-01 DIAGNOSIS — Z0189 Encounter for other specified special examinations: Secondary | ICD-10-CM | POA: Diagnosis not present

## 2023-02-01 DIAGNOSIS — I10 Essential (primary) hypertension: Secondary | ICD-10-CM | POA: Insufficient documentation

## 2023-02-01 DIAGNOSIS — Z9012 Acquired absence of left breast and nipple: Secondary | ICD-10-CM | POA: Insufficient documentation

## 2023-02-01 LAB — ECHOCARDIOGRAM COMPLETE
Area-P 1/2: 2.5 cm2
S' Lateral: 3.1 cm

## 2023-02-01 MED ORDER — HEPARIN SOD (PORK) LOCK FLUSH 100 UNIT/ML IV SOLN
500.0000 [IU] | Freq: Once | INTRAVENOUS | Status: AC
  Administered 2023-02-01: 500 [IU]

## 2023-02-01 MED ORDER — SODIUM CHLORIDE 0.9% FLUSH
10.0000 mL | Freq: Once | INTRAVENOUS | Status: AC
  Administered 2023-02-01: 10 mL

## 2023-02-12 ENCOUNTER — Telehealth: Payer: Self-pay

## 2023-02-12 NOTE — Telephone Encounter (Signed)
-----   Message from Malachy Mood sent at 02/12/2023  8:11 AM EDT ----- PLEASE let pt know her echo is normal, thanks   Malachy Mood

## 2023-02-12 NOTE — Telephone Encounter (Signed)
Called the pt twice before leaving a VM,regarding the message below. I will try back later.   Rondel Jumbo CMA

## 2023-02-28 ENCOUNTER — Telehealth: Payer: Self-pay | Admitting: Hematology

## 2023-03-01 ENCOUNTER — Inpatient Hospital Stay: Payer: Medicare Other

## 2023-03-29 ENCOUNTER — Encounter: Payer: Self-pay | Admitting: Hematology

## 2023-03-29 ENCOUNTER — Inpatient Hospital Stay: Payer: Medicare Other

## 2023-03-29 ENCOUNTER — Inpatient Hospital Stay: Payer: Medicare Other | Attending: Nurse Practitioner

## 2023-03-29 ENCOUNTER — Other Ambulatory Visit: Payer: Self-pay

## 2023-03-29 ENCOUNTER — Inpatient Hospital Stay (HOSPITAL_BASED_OUTPATIENT_CLINIC_OR_DEPARTMENT_OTHER): Payer: Medicare Other | Admitting: Hematology

## 2023-03-29 VITALS — BP 138/82 | HR 67 | Temp 97.7°F | Resp 18 | Ht 61.0 in | Wt 173.2 lb

## 2023-03-29 DIAGNOSIS — Z452 Encounter for adjustment and management of vascular access device: Secondary | ICD-10-CM | POA: Diagnosis not present

## 2023-03-29 DIAGNOSIS — Z171 Estrogen receptor negative status [ER-]: Secondary | ICD-10-CM | POA: Diagnosis not present

## 2023-03-29 DIAGNOSIS — Z95828 Presence of other vascular implants and grafts: Secondary | ICD-10-CM

## 2023-03-29 DIAGNOSIS — C50412 Malignant neoplasm of upper-outer quadrant of left female breast: Secondary | ICD-10-CM

## 2023-03-29 DIAGNOSIS — Z23 Encounter for immunization: Secondary | ICD-10-CM

## 2023-03-29 DIAGNOSIS — Z299 Encounter for prophylactic measures, unspecified: Secondary | ICD-10-CM

## 2023-03-29 DIAGNOSIS — Z853 Personal history of malignant neoplasm of breast: Secondary | ICD-10-CM | POA: Insufficient documentation

## 2023-03-29 DIAGNOSIS — Z08 Encounter for follow-up examination after completed treatment for malignant neoplasm: Secondary | ICD-10-CM | POA: Insufficient documentation

## 2023-03-29 LAB — CBC WITH DIFFERENTIAL (CANCER CENTER ONLY)
Abs Immature Granulocytes: 0.01 10*3/uL (ref 0.00–0.07)
Basophils Absolute: 0 10*3/uL (ref 0.0–0.1)
Basophils Relative: 1 %
Eosinophils Absolute: 0.2 10*3/uL (ref 0.0–0.5)
Eosinophils Relative: 5 %
HCT: 37.3 % (ref 36.0–46.0)
Hemoglobin: 12.6 g/dL (ref 12.0–15.0)
Immature Granulocytes: 0 %
Lymphocytes Relative: 30 %
Lymphs Abs: 1.6 10*3/uL (ref 0.7–4.0)
MCH: 31.6 pg (ref 26.0–34.0)
MCHC: 33.8 g/dL (ref 30.0–36.0)
MCV: 93.5 fL (ref 80.0–100.0)
Monocytes Absolute: 0.5 10*3/uL (ref 0.1–1.0)
Monocytes Relative: 9 %
Neutro Abs: 2.9 10*3/uL (ref 1.7–7.7)
Neutrophils Relative %: 55 %
Platelet Count: 168 10*3/uL (ref 150–400)
RBC: 3.99 MIL/uL (ref 3.87–5.11)
RDW: 12.7 % (ref 11.5–15.5)
WBC Count: 5.2 10*3/uL (ref 4.0–10.5)
nRBC: 0 % (ref 0.0–0.2)

## 2023-03-29 LAB — CMP (CANCER CENTER ONLY)
ALT: 13 U/L (ref 0–44)
AST: 29 U/L (ref 15–41)
Albumin: 3.9 g/dL (ref 3.5–5.0)
Alkaline Phosphatase: 64 U/L (ref 38–126)
Anion gap: 4 — ABNORMAL LOW (ref 5–15)
BUN: 16 mg/dL (ref 8–23)
CO2: 27 mmol/L (ref 22–32)
Calcium: 8.9 mg/dL (ref 8.9–10.3)
Chloride: 104 mmol/L (ref 98–111)
Creatinine: 0.63 mg/dL (ref 0.44–1.00)
GFR, Estimated: >60 mL/min (ref >60)
Glucose, Bld: 103 mg/dL — ABNORMAL HIGH (ref 70–99)
Potassium: 3.7 mmol/L (ref 3.5–5.1)
Sodium: 135 mmol/L (ref 135–145)
Total Bilirubin: 0.5 mg/dL (ref <1.2)
Total Protein: 6 g/dL — ABNORMAL LOW (ref 6.5–8.1)

## 2023-03-29 MED ORDER — INFLUENZA VAC A&B SURF ANT ADJ 0.5 ML IM SUSY
0.5000 mL | PREFILLED_SYRINGE | Freq: Once | INTRAMUSCULAR | Status: AC
  Administered 2023-03-29: 0.5 mL via INTRAMUSCULAR
  Filled 2023-03-29: qty 0.5

## 2023-03-29 MED ORDER — SODIUM CHLORIDE 0.9% FLUSH
10.0000 mL | Freq: Once | INTRAVENOUS | Status: AC
  Administered 2023-03-29: 10 mL

## 2023-03-29 MED ORDER — HEPARIN SOD (PORK) LOCK FLUSH 100 UNIT/ML IV SOLN
500.0000 [IU] | Freq: Once | INTRAVENOUS | Status: AC
  Administered 2023-03-29: 500 [IU]

## 2023-03-29 NOTE — Assessment & Plan Note (Signed)
Left breast Multifocal Metaplastic cancer, stage IIIC [id number], including one unresectable axillary node, metaplastic carcinoma, triple negative, grade 3 -diagnosed in 10/2019. CT CAP and bone scan 11/2019 were negative for distant metastasis.  -S/p left mastectomy with Dr Carolynne Edouard on 12/26/19. Surgical path showed: 6cm multifocal metaplastic carcinoma; 13/16 positive LN, one of which was not able to be removed due to vascular invasion.  -s/p adjuvant carbo/taxol 02/05/20 - 05/13/20 followed by Adriamycin/cytoxan 05/20/20 - 07/13/20 along with [place] Sep through 01/19/21.  She tolerated AC poorly with mucositis, weight loss, constipation, and weakness.  -interim left axillary [place] 06/17/20 is negative for adenopathy, c/w response to adjuvant chemo -s/p adjuvant radiation with Dr. Basilio Cairo 08/24/20 - 10/05/20 -restaging CT chest on 12/07/2022 showed no evidence of cancer recurrence, except for surgical and postradiation changes. -she will keep her port for now. She will continue to return for flush every 6-8 weeks.

## 2023-03-29 NOTE — Patient Instructions (Signed)

## 2023-03-29 NOTE — Patient Instructions (Signed)

## 2023-03-29 NOTE — Progress Notes (Signed)
[facility]   [phone]) [phone] [phone]   Clinic Follow up Note   Patient Care Team: Pcp, No as PCP - General Pershing Proud, RN as Oncology Nurse Navigator Donnelly Angelica, RN as Oncology Nurse Navigator Griselda Miner, MD as Consulting Physician (General Surgery) Malachy Mood, MD as Consulting Physician (Hematology) Lonie Peak, MD as Attending Physician (Radiation Oncology) Pollyann Samples, NP as Nurse Practitioner (Nurse Practitioner)  Date of Service:  03/29/2023  CHIEF COMPLAINT: f/u of breast cancer   CURRENT THERAPY:  Surveillance   Oncology History   Malignant neoplasm of upper-outer quadrant of left breast in female, estrogen receptor negative (HCC)  Left breast Multifocal Metaplastic cancer, stage IIIC pT3N3aM0, including one unresectable axillary node, metaplastic carcinoma, triple negative, grade 3 -diagnosed in 10/2019. CT CAP and bone scan 11/2019 were negative for distant metastasis.  -S/p left mastectomy with Dr Carolynne Edouard on 12/26/19. Surgical path showed: 6cm multifocal metaplastic carcinoma; 13/16 positive LN, one of which was not able to be removed due to vascular invasion.  -s/p adjuvant carbo/taxol 02/05/20 - 05/13/20 followed by Adriamycin/cytoxan 05/20/20 - 07/13/20 along with [place] Sep through 01/19/21.  She tolerated AC poorly with mucositis, weight loss, constipation, and weakness.  -interim left axillary [place] 06/17/20 is negative for adenopathy, c/w response to adjuvant chemo -s/p adjuvant radiation with Dr. Basilio Cairo 08/24/20 - 10/05/20 -restaging CT chest on 12/07/2022 showed no evidence of cancer recurrence, except for surgical and postradiation changes. -she will keep her port for now. She will continue to return for flush every 6-8 weeks.  Assessment and Plan    Breast Cancer Follow-up for breast cancer. Reports improvement over the last two months with no new symptoms. Physical exam shows scar tissue but no concerning nodules. Port  is functioning well. Discussed Signatera blood test for detecting tumor cells, covered by Medicare. If positive, consider PET scan. Emphasized regular monitoring. - Order CT scan for August 2025 - Schedule mammogram ASAP - Flush port every eight weeks - Order Signatera blood test in eight weeks  Lymphedema and left shoulder/upper arm pain  Persistent swelling and pain in the left arm, likely due to lymph node removal during breast cancer surgery. Uses compression sleeve and exercises but still has discomfort. Discussed dry needling for pain relief. - Refer to physical therapy for PT and dry needling - Schedule physical therapy after May 09, 2023   Plan -she is clinically doing well, lab reviewed. -Port flush every 8 weeks with lab, will order Signatera for next lab - Follow-up appointment on July 21, 2023.  -PT referral      SUMMARY OF ONCOLOGIC HISTORY: Oncology History Overview Note  Cancer Staging Malignant neoplasm of upper-outer quadrant of left breast in female, estrogen receptor negative (HCC) Staging form: Breast, AJCC 8th Edition - Clinical stage from 10/28/2019: Stage IIIB (cT2, cN1, cM0, G3, ER-, PR-, HER2-) - Signed by Lonie Peak, MD on 11/05/2019 - Pathologic stage from 12/26/2019: Stage IIIC (pT3, pN3a, cM0, G3, ER-, PR-, HER2-) - Signed by Malachy Mood, MD on 01/28/2020    Malignant neoplasm of upper-outer quadrant of left breast in female, estrogen receptor negative (HCC)  10/27/2019 Mammogram   IMPRESSION: 1. Right breast calcifications spanning 2.6 cm in the upper slightly medial breast are indeterminate.   2. Left breast dominant mass at 3 o'clock, 6 cm from nipple measuring 3x3.9x2.9 cm is highly suspicious. There is overlying skin thickening, skin involvement is not excluded.   3. Left breast mass/distorted tissue at  1 o'clock, 4cm from nipple measuring 3.0x1.2x2.9 cm is likely in contiguity with the dominant mass at 3 o'clock and is also highly  suspicious. With the dominant lesion the overall span a disease is approximately 6 cm.   4. Left breast distortion at 2 o'clock 10 cm from the nipple is Suspicious, measuring 1.0 x 0.8 cm.   5.  Abnormal left axillary lymph nodes (2). Cortical thickness measures up to 0.6 cm.    10/28/2019 Initial Biopsy   Diagnosis 1. Breast, left, needle core biopsy, 3 o'clock, 5cmfn - INVASIVE MAMMARY CARCINOMA. 2. Breast, left, needle core biopsy, 2 o'clock, 10cmfn - INVASIVE MAMMARY CARCINOMA. 3. 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 Microscopic Comment 1. -3. Overall, immunohistochemistry favors a pronounced desmoplastic response, but metaplastic carcinoma cannot be excluded (Epithelioid component: CKAE1AE3 strong +, CK5/6 weak +). The greatest linear extent of tumor in any one core in specimen 1 is 14 mm. The greatest linear extent of tumor in any one core in specimen 2 is 16 mm.   10/28/2019 Receptors her2   3. PROGNOSTIC INDICATORS Results: IMMUNOHISTOCHEMICAL AND MORPHOMETRIC ANALYSIS PERFORMED MANUALLY The tumor cells are EQUIVOCAL for Her2 (2+). Her2 by FISH will be performed and results reported separately. Estrogen Receptor: 0%, NEGATIVE Progesterone Receptor: 0%, NEGATIVE Proliferation Marker Ki67: 10%    3. FLUORESCENCE IN-SITU HYBRIDIZATION Results: GROUP 5: HER2 **NEGATIVE** Equivocal form of amplification of the HER2 gene was detected in the IHC 2+ tissue sample received from this individual. HER2 FISH was performed by a technologist and cell imaging and analysis on the [employer].   10/28/2019 Cancer Staging   Staging form: Breast, AJCC 8th Edition - Clinical stage from 10/28/2019: Stage IIIB (cT2, cN1, cM0, G3, ER-, PR-, HER2-) - Signed by Lonie Peak, MD on 11/05/2019   10/31/2019 Initial Diagnosis   Malignant neoplasm of upper-outer quadrant of left breast in female, estrogen receptor negative (HCC)   10/31/2019  Pathology Results   Diagnosis Breast, right, needle core biopsy, UOQ, posterior - FOCAL USUAL DUCTAL HYPERPLASIA AND FIBROCYSTIC CHANGES WITH CALCIFICATIONS - FIBROADENOMATOID CHANGES - NO MALIGNANCY IDENTIFIED Microscopic Comment These results were called to [facility] on November 03, 2019.   11/05/2019 Genetic Testing   She declined Genetic testing    11/21/2019 Imaging   CT CAP w contrast  IMPRESSION: 1. Left breast mass and prominent left axillary lymph nodes, containing biopsy marking clips, consistent with newly diagnosed breast malignancy. 2. No definite evidence of distant metastatic disease in the chest, abdomen, or pelvis. 3. There are occasional small pulmonary nodules, measuring 2-3 mm, most likely incidental sequelae of prior infection or inflammation although metastatic disease is not excluded. Attention on follow-up. 4. Hepatic steatosis. 5. Aortic Atherosclerosis (ICD10-I70.0).     11/21/2019 Imaging   Bone Scan Whole Body IMPRESSION: 1. Single focus of uptake associated with a subacute fracture of LEFT anterior fourth rib. 2. No additional areas of abnormal uptake.   12/26/2019 Cancer Staging   Staging form: Breast, AJCC 8th Edition - Pathologic stage from 12/26/2019: Stage IIIC (pT3, pN3a, cM0, G3, ER-, PR-, HER2-) - Signed by Malachy Mood, MD on 01/28/2020   12/26/2019 Surgery   LEFT MASTECTOMY MODIFIED RADICAL and PAC Placement by Dr Carolynne Edouard   12/26/2019 Pathology Results   FINAL MICROSCOPIC DIAGNOSIS:   A. 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  - Ductal carcinoma  in situ, high nuclear grade with central necrosis.  - Margins of resection:  - Metaplastic carcinoma focally involves the anterior margin and is < 1  mm from the posterior margin.  - DCIS is < 1 mm from the anterior margin.  - Metastatic carcinoma in (13) of (16) lymph nodes with  extranodal  extension.  - Biopsy clip sites in breast and one lymph node.  - See oncology table.    ADDENDUM:  PROGNOSTIC INDICATOR RESULTS:  Immunohistochemical and morphometric analysis performed manually  The tumor cells are EQUIVOCAL for Her2 (2+). Her2 FISH has been ordered  and will be reported in an addendum.  Estrogen Receptor:       NEGATIVE  Progesterone Receptor:   NEGATIVE  Proliferation Marker Ki-67:   30%    ADDENDUM:  FLOURESCENCE IN-SITU HYBRIDIZATION RESULTS:  GROUP 5:   HER2 NEGATIVE   01/28/2020 Echocardiogram   Baseline Echo  IMPRESSIONS     1. Left ventricular ejection fraction, by estimation, is 60 to 65%. The  left ventricle has normal function. The left ventricle has no regional  wall motion abnormalities. Left ventricular diastolic parameters are  consistent with Grade I diastolic  dysfunction (impaired relaxation). The average left ventricular global  longitudinal strain is -19.8 %.   2. Right ventricular systolic function is normal. The right ventricular  size is normal. Tricuspid regurgitation signal is inadequate for assessing  PA pressure.   3. The mitral valve is normal in structure. No evidence of mitral valve  regurgitation. No evidence of mitral stenosis.   4. The aortic valve is normal in structure. Aortic valve regurgitation is  not visualized. No aortic stenosis is present.   5. The inferior vena cava is normal in size with greater than 50%  respiratory variability, suggesting right atrial pressure of 3 mmHg.    02/05/2020 - 01/19/2021 Chemotherapy   Keytruda q3weeks (to be taken for 1 whole year) with weekly Carboplatin/Taxol for 12 weeks starting 02/05/20-05/13/20 followed by Adriamycin/Cytoxan q2weeks X4 starting 05/20/20-07/13/20.  ----Continue Keytruda q3weeks from 08/03/20 to complete 1 year of treatment from 02/05/20).    06/17/2020 Breast US   FINDINGS: On physical exam,well-healed scars of LEFT mastectomy and LEFT axillary node  dissection. I palpate no axillary mass. Ultrasound is performed, showing normal appearing LEFT axillary contents. No mass or enlarged lymph nodes. IMPRESSION: Ultrasound is negative for LEFT axillary adenopathy.   08/24/2020 - 10/05/2020 Radiation Therapy   Adjuvant RT per Dr. Basilio Cairo starting 08/24/20-10/05/20   04/14/2021 Survivorship   SCP delivered by Santiago Glad, NP      Discussed the use of AI scribe software for clinical note transcription with the patient, who gave verbal consent to proceed.  History of Present Illness   The patient is a 67 year old female with a history of breast cancer, who presents with multiple health concerns. She recently experienced a virus that attacked her sodium levels, leading to dehydration and a visit to the emergency room. Despite drinking a gallon of water a day, her sodium levels bottomed out, causing her to feel unwell for several days. She reports that she has felt better in the last month than she has in four years.  The patient also has a history of chronic bronchitis, which she manages with an inhaler and medication. She expresses concern about her lung health and the possibility of the bronchitis recurring. She has a family member who is a pulmonologist, but has not yet set up an appointment with them.  She reports  swelling in her leg and arm, particularly around the area where lymph nodes were removed during her breast cancer treatment. She wears a sleeve to manage the swelling, but still experiences pain. She is interested in trying dry needle therapy to alleviate the pain.  The patient is also concerned about her blood pressure, which has been elevated. She monitors it regularly and has noticed that it tends to increase when she is not feeling well. She is aware of the potential risks associated with high blood pressure and is proactive in managing it.  Finally, the patient expresses concern about the possibility of her cancer returning. She is  interested in a new blood test called Signatera, which could potentially detect tumor cells in the blood. She is scheduled to have this test in eight weeks.         All other systems were reviewed with the patient and are negative.  MEDICAL HISTORY:  Past Medical History:  Diagnosis Date   Breast CA (HCC)    Bronchitis    Family history of bone cancer    Family history of breast cancer    Family history of prostate cancer    Fibromyalgia    HCV (hepatitis C virus)    MRSA (methicillin resistant Staphylococcus aureus) 2012   Shingles     SURGICAL HISTORY: Past Surgical History:  Procedure Laterality Date   MASTECTOMY MODIFIED RADICAL Left 12/26/2019   Procedure: LEFT MASTECTOMY MODIFIED RADICAL;  Surgeon: Griselda Miner, MD;  Location[facility] OR;  Service: General;  Laterality: Left;  PEC BLOCK   PORTACATH PLACEMENT Right 12/26/2019   Procedure: INSERTION PORT-A-CATH WITH ULTRASOUND GUIDANCE;  Surgeon: Griselda Miner, MD;  Location: [facility] OR;  Service: General;  Laterality: Right;    I have reviewed the social history and family history with the patient and they are unchanged from previous note.  ALLERGIES:  is allergic to codeine.  MEDICATIONS:  Current Outpatient Medications  Medication Sig Dispense Refill   acetaminophen (TYLENOL) 650 MG CR tablet Take 650 mg by mouth every 8 (eight) hours as needed for pain.      ascorbic acid (VITAMIN C) 250 MG CHEW Chew by mouth.     benzonatate (TESSALON) 200 MG capsule Take 1 capsule (200 mg total) by mouth 3 (three) times daily as needed for cough. 20 capsule 0   dexamethasone (DECADRON) 0.5 MG/5ML solution Take 10 ml (1 mg) swish for 2 minutes and then spit.  Do this four times daily as needed Do Not eat or drink for 1 hour after. 240 mL 0   diphenoxylate-atropine (LOMOTIL) 2.5-0.025 MG tablet Take 1-2 tablets by mouth 4 (four) times daily as needed for diarrhea or loose stools. 30 tablet 1   doxycycline (VIBRA-TABS) 100 MG tablet Take 1  tablet (100 mg total) by mouth 2 (two) times daily. 14 tablet 0   gabapentin (NEURONTIN) 300 MG capsule Take 300 mg by mouth 3 (three) times daily as needed.     levofloxacin (LEVAQUIN) 500 MG tablet Take 1 tablet (500 mg total) by mouth daily. 7 tablet 0   lidocaine (XYLOCAINE) 2 % solution Use as directed 15 mLs in the mouth or throat every 4 (four) hours as needed for mouth pain. 100 mL 2   lidocaine-prilocaine (EMLA) cream Apply 1 Application topically as needed. 30 g 2   methocarbamol (ROBAXIN) 750 MG tablet Take 1 tablet (750 mg total) by mouth 4 (four) times daily as needed (use for muscle cramps/pain). 30 tablet 2  mucosal barrier oral (GELCLAIR) GEL Take 1 packet by mouth as needed.     omeprazole (PRILOSEC) 20 MG capsule Take 1 capsule (20 mg total) by mouth daily. 30 capsule 2   valACYclovir (VALTREX) 1000 MG tablet Take 1 tablet (1,000 mg total) by mouth 2 (two) times daily. 14 tablet 1   No current facility-administered medications for this visit.    PHYSICAL EXAMINATION: ECOG PERFORMANCE STATUS: 1 - Symptomatic but completely ambulatory  Vitals:   03/29/23 1025  BP: 138/82  Pulse: 67  Resp: 18  Temp: 97.7 F (36.5 C)  SpO2: 98%   Wt Readings from Last 3 Encounters:  03/29/23 173 lb 3.2 oz (78.6 kg)  12/14/22 173 lb 12.8 oz (78.8 kg)  08/31/22 171 lb 3.2 oz (77.7 kg)     GENERAL:alert, no distress and comfortable SKIN: skin color, texture, turgor are normal, no rashes or significant lesions EYES: normal, Conjunctiva are pink and non-injected, sclera clear NECK: supple, thyroid normal size, non-tender, without nodularity LYMPH:  no palpable lymphadenopathy in the cervical, axillary  LUNGS: clear to auscultation and percussion with normal breathing effort HEART: regular rate & rhythm and no murmurs and no lower extremity edema ABDOMEN:abdomen soft, non-tender and normal bowel sounds Musculoskeletal:no cyanosis of digits and no clubbing  NEURO: alert & oriented x 3  with fluent speech, no focal motor/sensory deficits Breast: Status post left mastectomy with surgical scar, no nodule on chest wall or palpable adenopathy in left axilla.  Right breast exam was unremarkable  LABORATORY DATA:  I have reviewed the data as listed    Latest Ref Rng & Units 03/29/2023    9:54 AM 01/11/2023   10:28 AM 12/28/2022    9:35 PM  CBC  WBC 4.0 - 10.5 K/uL 5.2  3.5  2.7   Hemoglobin 12.0 - 15.0 g/dL 32.9  51.8  84.1   Hematocrit 36.0 - 46.0 % 37.3  37.1  38.5   Platelets 150 - 400 K/uL 168  215  110         Latest Ref Rng & Units 03/29/2023    9:54 AM 01/11/2023   10:28 AM 12/28/2022    9:35 PM  CMP  Glucose 70 - 99 mg/dL 660  98  630   BUN 8 - 23 mg/dL 16  11  23    Creatinine 0.44 - 1.00 mg/dL 1.60  1.09  3.23   Sodium 135 - 145 mmol/L 135  138  125   Potassium 3.5 - 5.1 mmol/L 3.7  4.1  3.7   Chloride 98 - 111 mmol/L 104  104  93   CO2 22 - 32 mmol/L 27  28  22    Calcium 8.9 - 10.3 mg/dL 8.9  9.0  8.5   Total Protein 6.5 - 8.1 g/dL 6.0  6.0  5.4   Total Bilirubin <1.2 mg/dL 0.5  0.5  0.7   Alkaline Phos 38 - 126 U/L 64  193  163   AST 15 - 41 U/L 29  54  139   ALT 0 - 44 U/L 13  20  38       RADIOGRAPHIC STUDIES: I have personally reviewed the radiological images as listed and agreed with the findings in the report. No results found.    Orders Placed This Encounter  Procedures   Ambulatory referral to Physical Therapy    Referral Priority:   Routine    Referral Type:   Physical Medicine    Referral Reason:  Specialty Services Required    Requested Specialty:   Physical Therapy    Number of Visits Requested:   1   All questions were answered. The patient knows to call the clinic with any problems, questions or concerns. No barriers to learning was detected. The total time spent in the appointment was 30 minutes.     Malachy Mood, MD 03/29/2023

## 2023-04-02 ENCOUNTER — Other Ambulatory Visit: Payer: Self-pay

## 2023-04-02 DIAGNOSIS — Z171 Estrogen receptor negative status [ER-]: Secondary | ICD-10-CM

## 2023-04-10 ENCOUNTER — Other Ambulatory Visit: Payer: Self-pay

## 2023-04-10 DIAGNOSIS — R058 Other specified cough: Secondary | ICD-10-CM

## 2023-05-09 NOTE — Therapy (Signed)
 OUTPATIENT PHYSICAL THERAPY  UPPER EXTREMITY ONCOLOGY EVALUATION  Patient Name: Lindsay [name] MRN: [id number] DOB:1956/03/24, 68 y.o., female Today's Date: 05/10/2023  END OF SESSION:  PT End of Session - 05/10/23 1547     Visit Number 1    Number of Visits 8    Date for PT Re-Evaluation 06/07/23    PT Start Time 1548    PT Stop Time 1700    PT Time Calculation (min) 72 min    Activity Tolerance Patient tolerated treatment well    Behavior During Therapy WFL for tasks assessed/performed             Past Medical History:  Diagnosis Date   Breast CA (HCC)    Bronchitis    Family history of bone cancer    Family history of breast cancer    Family history of prostate cancer    Fibromyalgia    HCV (hepatitis C virus)    MRSA (methicillin resistant Staphylococcus aureus) 2012   Shingles    Past Surgical History:  Procedure Laterality Date   MASTECTOMY MODIFIED RADICAL Left 12/26/2019   Procedure: LEFT MASTECTOMY MODIFIED RADICAL;  Surgeon: Curvin Deward MOULD, MD;  Location: [facility] OR;  Service: General;  Laterality: Left;  PEC BLOCK   PORTACATH PLACEMENT Right 12/26/2019   Procedure: INSERTION PORT-A-CATH WITH ULTRASOUND GUIDANCE;  Surgeon: Curvin Deward MOULD, MD;  Location: [facility] OR;  Service: General;  Laterality: Right;   Patient Active Problem List   Diagnosis Date Noted   Chemotherapy induced neutropenia (HCC) 03/31/2020   Port-A-Cath in place 02/04/2020   Family history of breast cancer    Family history of prostate cancer    Family history of bone cancer    Malignant neoplasm of upper-outer quadrant of left breast in female, estrogen receptor negative (HCC) 10/31/2019      REFERRING PROVIDER: Onita Mattock, MD  REFERRING DIAG: Left arm/shoulder pain after surgery  THERAPY DIAG:  Malignant neoplasm of upper-outer quadrant of left breast in female, estrogen receptor negative (HCC)  Aftercare following surgery for neoplasm  ONSET DATE: 9 months  Rationale for Evaluation and  Treatment: Rehabilitation  SUBJECTIVE:                                                                                                                                                                                           SUBJECTIVE STATEMENT:  Pain on top and under her arm and sometimes to the back of her arm. Sometimes she needs to use her right arm to pick it up. It feels very tight and burns in the under arm area and lateral trunk. She uses  light heat on her shoulder, and rubs a Frankincense ointment on it at times too. Pain is there all the time, but she doesn't notice it as much after she moves her arm around or she is engaged in activity.   PERTINENT HISTORY: . 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 Patient underwent a left modified radical mastectomy on 12/26/2019 with 13 of 16 were positive for cancer. The Ki67 is 10%. There may be some cancer attaching the LN to the visceral vein but no new diagnositics have been done.   PAIN:  Are you having pain? Yes NPRS scale: 4/10- 9/10 at worst Pain location: left shoulder/underarm/ Pain orientation: Left  PAIN TYPE: aching and burning Pain description: constant  Aggravating factors: not moving, 1st thing in am,can't lay on left Relieving factors: movement, frankincense, tylenol , lidocaine  cream  PRECAUTIONS: Left UE lymphedema risk  RED FLAGS: None   WEIGHT BEARING RESTRICTIONS: No  FALLS:  Has patient fallen in last 6 months? Yes. 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  COGNITION: Overall cognitive status: Within functional limits for tasks  assessed   PALPATION: Tight in left pectorals especially axillary border with tenderness, several cords palpable in axillary region, but not easily visible  OBSERVATIONS / OTHER ASSESSMENTS: Wearing lymphdivas 30-40 sleeve, left UT compensation with reaching activities  SENSATION: Light touch: Deficits    POSTURE: forward head, rounded shoulders  UPPER EXTREMITY AROM/PROM:  A/PROM RIGHT   eval   Shoulder extension 60  Shoulder flexion 154  Shoulder abduction 165  Shoulder internal rotation 78  Shoulder external rotation 100    (Blank rows = not tested)  A/PROM LEFT   eval  Shoulder extension 48 pain  Shoulder flexion 132 Pain  Shoulder abduction 70 Pain  Shoulder internal rotation   Shoulder external rotation     (Blank rows = not tested)  CERVICAL AROM: All within functional limits:      UPPER EXTREMITY STRENGTH:   LYMPHEDEMA ASSESSMENTS:   SURGERY TYPE/DATE: left modified radical mastectomy on 12/26/2019   NUMBER OF LYMPH NODES REMOVED: 13 of 16 were positive for cancer  CHEMOTHERAPY: YES  RADIATION:YES 08/24/20-10/05/20  HORMONE TREATMENT: NO  INFECTIONS:    LYMPHEDEMA ASSESSMENTS:   LANDMARK RIGHT  eval  At axilla  34.5  15 cm proximal to olecranon process   10 cm proximal to olecranon process 31.9  Olecranon process 25.5  15 cm proximal to ulnar styloid process   10 cm proximal to ulnar styloid process 22.8  Just proximal to ulnar styloid process 16.1  Across hand at thumb web space 19.6  At base of 2nd digit 6.7  (Blank rows = not tested)  LANDMARK LEFT  eval  At axilla    15 cm proximal to olecranon process   10 cm proximal to olecranon process 32.5  Olecranon process 24.4  15 cm proximal to ulnar styloid process   10 cm proximal to ulnar styloid process 21.5  Just proximal to ulnar styloid process 16.1  Across hand at thumb web space 19.3  At base of 2nd digit 6.6  (Blank rows = not tested)   FUNCTIONAL TESTS:  GAIT:WNL   L-DEX LYMPHEDEMA SCREENING: The patient was assessed using the L-Dex machine today to produce a lymphedema index baseline score. The patient will be reassessed on a regular basis (typically every 3 months) to obtain new L-Dex scores. If the score is > 6.5 points away from his/her baseline score indicating onset of subclinical lymphedema, it will be recommended to wear a compression garment for 4 weeks, 12 hours per day and then be reassessed. If the score continues to be > 6.5 points from baseline at reassessment, we will initiate lymphedema treatment. Assessing in this manner has a 95% rate of preventing clinically significant lymphedema.  L-DEX FLOWSHEETS - 05/10/23 1600       L-DEX LYMPHEDEMA SCREENING   Measurement Type Unilateral    L-DEX MEASUREMENT EXTREMITY Upper Extremity    POSITION  Standing    DOMINANT SIDE Right    At Risk Side Left    BASELINE SCORE (UNILATERAL) 7.4    L-DEX SCORE (UNILATERAL) 5.7    VALUE CHANGE (UNILAT) -1.7              QUICK DASH SURVEY: 66%                                                                                                                            TREATMENT DATE:  05/10/2023 Educated in 4 post op exercises 2x/day with 5 sec hold taking to the point of stretch and not shoulder pain. Pt to do flexion and stargazer in supine.    PATIENT EDUCATION:  Education details: POC, treatment interventions, LOS, 4 post op exercises Person educated: Patient Education method: Explanation, Demonstration, and Handouts Education comprehension: verbalized understanding and returned demonstration  HOME EXERCISE PROGRAM: 4 post op exercises  ASSESSMENT:  CLINICAL IMPRESSION: Patient is a 68 y.o. female who was seen today for physical therapy evaluation and treatment for concerns of left UE swelling, shoulder and arm pain post Left Mastectomy on 12/26/2019 with 13+/16 LN's.  She presents with significant limitations in left  shoulder ROM due to tightness and possibly left UE cording, not easily visible but several palpable in the upper arm. She demonstrates left UT compensation which creates left shoulder pain. There was no real significant difference in circumferential measurements, and her SOZO screen is well in the green zone. She will benefit from skilled PT to address left shoulder pain, pectoral tightness, cording and limitations in ROM so she may return to her PLOF   OBJECTIVE IMPAIRMENTS: decreased activity tolerance, decreased knowledge of condition, decreased ROM, decreased strength, hypomobility, increased fascial restrictions, impaired flexibility, impaired UE functional use, postural dysfunction, and pain.   ACTIVITY LIMITATIONS: lifting, reach over head, and hygiene/grooming  PARTICIPATION LIMITATIONS: driving, occupation, and anything requiring overhead reaching  PERSONAL FACTORS: 3+ comorbidities: Triple Negative breast Cancer s/p chemo and radiation  are also affecting patient's functional outcome.   REHAB POTENTIAL: Good  CLINICAL DECISION MAKING: Stable/uncomplicated  EVALUATION COMPLEXITY: Low  GOALS: Goals reviewed with patient?  Yes  SHORT TERM GOALS=LONG TERM GOALS: Target date: 06/07/2023  Pt will be independent with a HEP to improve left shoulder ROM and strength Baseline: Goal status: INITIAL  2.  Pt will improve left shoulder flexion to atleast 145 degrees for improved reaching Baseline: 132 Goal status: INITIAL  3.  Pt will improve left shoulder abd to atleast 130 degrees   with 0-min UT compensation Baseline: 70 Goal status: INITIAL  4.  Left UE pain will be decreased by 50% or greater Baseline:  Goal status: INITIAL  5.  Quick dash will be improved to no greater than 30% to demonstrate improved function Baseline:  Goal status: INITIAL   PLAN:  PT FREQUENCY: 2x/week  PT DURATION: 4 weeks  PLANNED INTERVENTIONS: 97164- PT Re-evaluation, 97110-Therapeutic  exercises, 97530- Therapeutic activity, 97112- Neuromuscular re-education, 97535- Self Care, 02859- Manual therapy, 97760- Orthotic Fit/training, Patient/Family education, Balance training, Dry Needling, and Joint mobilization  PLAN FOR NEXT SESSION: STM prn to pecs/lateral trunk,  DN prn,UT prn, joint mobs, review HEP;try wand flexion and scaption, PROM, decrease UT compensation, progress to strength as ROM improves.  Grayce JINNY Sheldon, PT 05/10/2023, 5:08 PM

## 2023-05-10 ENCOUNTER — Ambulatory Visit: Payer: Medicare Other | Attending: Hematology

## 2023-05-10 ENCOUNTER — Other Ambulatory Visit: Payer: Self-pay

## 2023-05-10 DIAGNOSIS — M25612 Stiffness of left shoulder, not elsewhere classified: Secondary | ICD-10-CM

## 2023-05-10 DIAGNOSIS — M79602 Pain in left arm: Secondary | ICD-10-CM | POA: Diagnosis not present

## 2023-05-10 DIAGNOSIS — Z483 Aftercare following surgery for neoplasm: Secondary | ICD-10-CM | POA: Diagnosis present

## 2023-05-10 DIAGNOSIS — C50412 Malignant neoplasm of upper-outer quadrant of left female breast: Secondary | ICD-10-CM | POA: Insufficient documentation

## 2023-05-10 DIAGNOSIS — M25512 Pain in left shoulder: Secondary | ICD-10-CM | POA: Insufficient documentation

## 2023-05-10 DIAGNOSIS — Z171 Estrogen receptor negative status [ER-]: Secondary | ICD-10-CM | POA: Diagnosis present

## 2023-05-10 DIAGNOSIS — I890 Lymphedema, not elsewhere classified: Secondary | ICD-10-CM

## 2023-05-17 ENCOUNTER — Ambulatory Visit: Payer: Medicare Other

## 2023-05-17 DIAGNOSIS — I890 Lymphedema, not elsewhere classified: Secondary | ICD-10-CM

## 2023-05-17 DIAGNOSIS — M25512 Pain in left shoulder: Secondary | ICD-10-CM

## 2023-05-17 DIAGNOSIS — Z483 Aftercare following surgery for neoplasm: Secondary | ICD-10-CM

## 2023-05-17 DIAGNOSIS — Z171 Estrogen receptor negative status [ER-]: Secondary | ICD-10-CM

## 2023-05-17 DIAGNOSIS — M25612 Stiffness of left shoulder, not elsewhere classified: Secondary | ICD-10-CM

## 2023-05-17 NOTE — Therapy (Signed)
 OUTPATIENT PHYSICAL THERAPY  UPPER EXTREMITY ONCOLOGY TREATMENT  Patient Name: Lindsay [name] MRN: [id number] DOB:[birth year], 68 y.o., female Today's Date: 05/17/2023  END OF SESSION:  PT End of Session - 05/17/23 1209     Visit Number 2    Number of Visits 8    Date for PT Re-Evaluation 06/07/23    PT Start Time 1205    PT Stop Time 1307    PT Time Calculation (min) 62 min    Activity Tolerance Patient tolerated treatment well    Behavior During Therapy WFL for tasks assessed/performed             Past Medical History:  Diagnosis Date   Breast CA (HCC)    Bronchitis    Family history of bone cancer    Family history of breast cancer    Family history of prostate cancer    Fibromyalgia    HCV (hepatitis C virus)    MRSA (methicillin resistant Staphylococcus aureus) 2012   Shingles    Past Surgical History:  Procedure Laterality Date   MASTECTOMY MODIFIED RADICAL Left 12/26/2019   Procedure: LEFT MASTECTOMY MODIFIED RADICAL;  Surgeon: Curvin Deward MOULD, MD;  Location: [facility] OR;  Service: General;  Laterality: Left;  PEC BLOCK   PORTACATH PLACEMENT Right 12/26/2019   Procedure: INSERTION PORT-A-CATH WITH ULTRASOUND GUIDANCE;  Surgeon: Curvin Deward MOULD, MD;  Location: [facility];  Service: General;  Laterality: Right;   Patient Active Problem List   Diagnosis Date Noted   Chemotherapy induced neutropenia (HCC) 03/31/2020   Port-A-Cath in place 02/04/2020   Family history of breast cancer    Family history of prostate cancer    Family history of bone cancer    Malignant neoplasm of upper-outer quadrant of left breast in female, estrogen receptor negative (HCC) 10/31/2019      REFERRING PROVIDER: Onita Mattock, MD  REFERRING DIAG: Left arm/shoulder pain after surgery  THERAPY DIAG:  Malignant neoplasm of upper-outer quadrant of left breast in female, estrogen receptor negative (HCC)  Aftercare following surgery for neoplasm  Left shoulder pain, unspecified  chronicity  Stiffness of left shoulder, not elsewhere classified  Secondary lymphedema  ONSET DATE: 9 months  Rationale for Evaluation and Treatment: Rehabilitation  SUBJECTIVE:                                                                                                                                                                                           SUBJECTIVE STATEMENT:  I don't think the shoulder ever got quite right from during my cancer treatments. I've been doing the exercises Grayce gave me last time 3x/day and  I can reach higher now. The pain isn't much better yet though.    PERTINENT HISTORY: . 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 Patient underwent a left modified radical mastectomy on 12/26/2019 with 13 of 16 were positive for cancer. The Ki67 is 10%. There may be some cancer attaching the LN to the visceral vein but no new diagnositics have been done.   PAIN:  Are you having pain? Yes NPRS scale: 9/10 at worst but it takes longer before I feel that 9/10 now Pain location: left shoulder/underarm Pain orientation: Left  PAIN TYPE: aching and burning Pain description: constant  Aggravating factors: not moving, 1st thing in am,can't lay on left Relieving factors: movement, frankincense, tylenol , lidocaine  cream  PRECAUTIONS: Left UE lymphedema risk  RED FLAGS: None   WEIGHT BEARING RESTRICTIONS: No  FALLS:  Has patient fallen in last 6 months? Yes. 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  COGNITION: Overall cognitive status: Within functional limits for tasks  assessed   PALPATION: Tight in left pectorals especially axillary border with tenderness, several cords palpable in axillary region, but not easily visible  OBSERVATIONS / OTHER ASSESSMENTS: Wearing lymphdivas 30-40 sleeve, left UT compensation with reaching activities  SENSATION: Light touch: Deficits    POSTURE: forward head, rounded shoulders  UPPER EXTREMITY AROM/PROM:  A/PROM RIGHT   eval   Shoulder extension 60  Shoulder flexion 154  Shoulder abduction 165  Shoulder internal rotation 78  Shoulder external rotation 100    (Blank rows = not tested)  A/PROM LEFT   eval  Shoulder extension 48 pain  Shoulder flexion 132 Pain  Shoulder abduction 70 Pain  Shoulder internal rotation   Shoulder external rotation     (Blank rows = not tested)  CERVICAL AROM: All within functional limits:      UPPER EXTREMITY STRENGTH:   LYMPHEDEMA ASSESSMENTS:   SURGERY TYPE/DATE: left modified radical mastectomy on 12/26/2019   NUMBER OF LYMPH NODES REMOVED: 13 of 16 were positive for cancer  CHEMOTHERAPY: YES  RADIATION:YES 08/24/20-10/05/20  HORMONE TREATMENT: NO  INFECTIONS:    LYMPHEDEMA ASSESSMENTS:   LANDMARK RIGHT  eval  At axilla  34.5  15 cm proximal to olecranon process   10 cm proximal to olecranon process 31.9  Olecranon process 25.5  15 cm proximal to ulnar styloid process   10 cm proximal to ulnar styloid process 22.8  Just proximal to ulnar styloid process 16.1  Across hand at thumb web space 19.6  At base of 2nd digit 6.7  (Blank rows = not tested)  LANDMARK LEFT  eval  At axilla    15 cm proximal to olecranon process   10 cm proximal to olecranon process 32.5  Olecranon process 24.4  15 cm proximal to ulnar styloid process   10 cm proximal to ulnar styloid process 21.5  Just proximal to ulnar styloid process 16.1  Across hand at thumb web space 19.3  At base of 2nd digit 6.6  (Blank rows = not tested)   FUNCTIONAL TESTS:     GAIT:WNL   L-DEX LYMPHEDEMA SCREENING: The patient was assessed using the L-Dex machine today  to produce a lymphedema index baseline score. The patient will be reassessed on a regular basis (typically every 3 months) to obtain new L-Dex scores. If the score is > 6.5 points away from his/her baseline score indicating onset of subclinical lymphedema, it will be recommended to wear a compression garment for 4 weeks, 12 hours per day and then be reassessed. If the score continues to be > 6.5 points from baseline at reassessment, we will initiate lymphedema treatment. Assessing in this manner has a 95% rate of preventing clinically significant lymphedema.     QUICK DASH SURVEY: 66%                                                                                                                            TREATMENT DATE:  05/17/2023: Manual Therapy P/ROM in supine to Lt shoulder into flex, abd and D2 with scapular depression throughout STM with cocoa butter to Lt pect tendon and then into Lt S/L to lateral trunk and periscapular area Scap Mobs into protraction and retraction  Therapeutic Exercises Reviewed post op exs issued at last session. Pt did well except for VC's to remind pt to hold stretches and not bounce at end of motions. Pulleys into flex x 2 mins   05/10/2023 Educated in 4 post op exercises 2x/day with 5 sec hold taking to the point of stretch and not shoulder pain. Pt to do flexion and stargazer in supine.    PATIENT EDUCATION:  Education details: POC, treatment interventions, LOS, 4 post op exercises Person educated: Patient Education method: Explanation, Demonstration, and Handouts Education comprehension: verbalized understanding and returned demonstration  HOME EXERCISE PROGRAM: 4 post op exercises  ASSESSMENT:  CLINICAL IMPRESSION: Pt returns after evaluation last week reporting she has been doing HEP 3x/wk and these are going well. Reviewed these with her to  assess technique and pt just required cues to slow down and now bounce at end of stretches. Then focused on manual therapy working to decrease Lt upper quadrant tightness and improve end ROM.    OBJECTIVE IMPAIRMENTS: decreased activity tolerance, decreased knowledge of condition, decreased ROM, decreased strength, hypomobility, increased fascial restrictions, impaired flexibility, impaired UE functional use, postural dysfunction, and pain.   ACTIVITY LIMITATIONS: lifting, reach over head, and hygiene/grooming  PARTICIPATION LIMITATIONS: driving, occupation, and anything requiring overhead reaching  PERSONAL FACTORS: 3+ comorbidities: Triple Negative breast Cancer s/p chemo and radiation  are also affecting patient's functional outcome.   REHAB POTENTIAL: Good  CLINICAL DECISION MAKING: Stable/uncomplicated  EVALUATION COMPLEXITY: Low  GOALS: Goals reviewed with patient? Yes  SHORT TERM GOALS=LONG TERM GOALS: Target date: 06/07/2023  Pt will be independent with a HEP to improve left shoulder ROM and strength Baseline: Goal status: INITIAL  2.  Pt will improve left shoulder flexion to atleast 145 degrees for improved reaching Baseline: 132 Goal status: INITIAL  3.  Pt will improve left shoulder abd to atleast 130 degrees   with 0-min UT compensation Baseline:  70 Goal status: INITIAL  4.  Left UE pain will be decreased by 50% or greater Baseline:  Goal status: INITIAL  5.  Quick dash will be improved to no greater than 30% to demonstrate improved function Baseline:  Goal status: INITIAL   PLAN:  PT FREQUENCY: 2x/week  PT DURATION: 4 weeks  PLANNED INTERVENTIONS: 97164- PT Re-evaluation, 97110-Therapeutic exercises, 97530- Therapeutic activity, 97112- Neuromuscular re-education, 97535- Self Care, 02859- Manual therapy, 97760- Orthotic Fit/training, Patient/Family education, Balance training, Dry Needling, and Joint mobilization  PLAN FOR NEXT SESSION: Cont STM prn to  pecs/lateral trunk,  DN prn,UT prn, joint mobs, review HEP;try wand flexion and scaption, PROM, decrease UT compensation, progress to strength as ROM improves.  Aden Berwyn Caldron, PTA 05/17/2023, 1:24 PM

## 2023-05-18 ENCOUNTER — Other Ambulatory Visit: Payer: Self-pay

## 2023-05-22 ENCOUNTER — Other Ambulatory Visit: Payer: Self-pay

## 2023-05-22 ENCOUNTER — Ambulatory Visit: Payer: Medicare Other

## 2023-05-22 DIAGNOSIS — I890 Lymphedema, not elsewhere classified: Secondary | ICD-10-CM

## 2023-05-22 DIAGNOSIS — Z483 Aftercare following surgery for neoplasm: Secondary | ICD-10-CM | POA: Diagnosis not present

## 2023-05-22 DIAGNOSIS — M25612 Stiffness of left shoulder, not elsewhere classified: Secondary | ICD-10-CM

## 2023-05-22 DIAGNOSIS — C50412 Malignant neoplasm of upper-outer quadrant of left female breast: Secondary | ICD-10-CM

## 2023-05-22 DIAGNOSIS — M25512 Pain in left shoulder: Secondary | ICD-10-CM

## 2023-05-22 NOTE — Patient Instructions (Addendum)
 Marland Kitchen

## 2023-05-22 NOTE — Therapy (Signed)
 OUTPATIENT PHYSICAL THERAPY  UPPER EXTREMITY ONCOLOGY TREATMENT  Patient Name: Lindsay [name] MRN: [id number] DOB:[birth year], 68 y.o., female Today's Date: 05/22/2023  END OF SESSION:  PT End of Session - 05/22/23 1206     Visit Number 3    Number of Visits 8    Date for PT Re-Evaluation 06/07/23    PT Start Time 1204    PT Stop Time 1308    PT Time Calculation (min) 64 min    Activity Tolerance Patient tolerated treatment well    Behavior During Therapy WFL for tasks assessed/performed             Past Medical History:  Diagnosis Date   Breast CA (HCC)    Bronchitis    Family history of bone cancer    Family history of breast cancer    Family history of prostate cancer    Fibromyalgia    HCV (hepatitis C virus)    MRSA (methicillin resistant Staphylococcus aureus) 2012   Shingles    Past Surgical History:  Procedure Laterality Date   MASTECTOMY MODIFIED RADICAL Left 12/26/2019   Procedure: LEFT MASTECTOMY MODIFIED RADICAL;  Surgeon: Curvin Deward MOULD, MD;  Location: [facility] OR;  Service: General;  Laterality: Left;  PEC BLOCK   PORTACATH PLACEMENT Right 12/26/2019   Procedure: INSERTION PORT-A-CATH WITH ULTRASOUND GUIDANCE;  Surgeon: Curvin Deward MOULD, MD;  Location: [facility] OR;  Service: General;  Laterality: Right;   Patient Active Problem List   Diagnosis Date Noted   Chemotherapy induced neutropenia (HCC) 03/31/2020   Port-A-Cath in place 02/04/2020   Family history of breast cancer    Family history of prostate cancer    Family history of bone cancer    Malignant neoplasm of upper-outer quadrant of left breast in female, estrogen receptor negative (HCC) 10/31/2019      REFERRING PROVIDER: Onita Mattock, MD  REFERRING DIAG: Left arm/shoulder pain after surgery  THERAPY DIAG:  Malignant neoplasm of upper-outer quadrant of left breast in female, estrogen receptor negative (HCC)  Aftercare following surgery for neoplasm  Left shoulder pain, unspecified  chronicity  Stiffness of left shoulder, not elsewhere classified  Secondary lymphedema  ONSET DATE: 9 months  Rationale for Evaluation and Treatment: Rehabilitation  SUBJECTIVE:                                                                                                                                                                                           SUBJECTIVE STATEMENT:  My shoulder felt a lot looser after last session. For about an hour and a half I could tell we did stuff right after as it  was just a little sore, but then it felt a lot better.    PERTINENT HISTORY: . 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 Patient underwent a left modified radical mastectomy on 12/26/2019 with 13 of 16 were positive for cancer. The Ki67 is 10%. There may be some cancer attaching the LN to the visceral vein but no new diagnositics have been done.   PAIN:  Are you having pain? Yes NPRS scale: 8/10  Pain location: left shoulder/underarm Pain orientation: Left  PAIN TYPE: aching and burning Pain description: constant  Aggravating factors: not moving, 1st thing in am,can't lay on left Relieving factors: movement, frankincense, tylenol , lidocaine  cream  PRECAUTIONS: Left UE lymphedema risk  RED FLAGS: None   WEIGHT BEARING RESTRICTIONS: No  FALLS:  Has patient fallen in last 6 months? Yes. 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  COGNITION: Overall cognitive status: Within functional limits for tasks assessed   PALPATION: Tight in left pectorals especially axillary border with tenderness, several  cords palpable in axillary region, but not easily visible  OBSERVATIONS / OTHER ASSESSMENTS: Wearing lymphdivas 30-40 sleeve, left UT compensation with reaching activities  SENSATION: Light touch: Deficits    POSTURE: forward head, rounded shoulders  UPPER EXTREMITY AROM/PROM:  A/PROM RIGHT   eval   Shoulder extension 60  Shoulder flexion 154  Shoulder abduction 165  Shoulder internal rotation 78  Shoulder external rotation 100    (Blank rows = not tested)  A/PROM LEFT   eval  Shoulder extension 48 pain  Shoulder flexion 132 Pain  Shoulder abduction 70 Pain  Shoulder internal rotation   Shoulder external rotation     (Blank rows = not tested)  CERVICAL AROM: All within functional limits:      UPPER EXTREMITY STRENGTH:   LYMPHEDEMA ASSESSMENTS:   SURGERY TYPE/DATE: left modified radical mastectomy on 12/26/2019   NUMBER OF LYMPH NODES REMOVED: 13 of 16 were positive for cancer  CHEMOTHERAPY: YES  RADIATION:YES 08/24/20-10/05/20  HORMONE TREATMENT: NO  INFECTIONS:    LYMPHEDEMA ASSESSMENTS:   LANDMARK RIGHT  eval  At axilla  34.5  15 cm proximal to olecranon process   10 cm proximal to olecranon process 31.9  Olecranon process 25.5  15 cm proximal to ulnar styloid process   10 cm proximal to ulnar styloid process 22.8  Just proximal to ulnar styloid process 16.1  Across hand at thumb web space 19.6  At base of 2nd digit 6.7  (Blank rows = not tested)  LANDMARK LEFT  eval  At axilla    15 cm proximal to olecranon process   10 cm proximal to olecranon process 32.5  Olecranon process 24.4  15 cm proximal to ulnar styloid process   10 cm proximal to ulnar styloid process 21.5  Just proximal to ulnar styloid process 16.1  Across hand at thumb web space 19.3  At base of 2nd digit 6.6  (Blank rows = not tested)   FUNCTIONAL TESTS:    GAIT:WNL   L-DEX LYMPHEDEMA SCREENING: The patient was assessed using the L-Dex machine today to produce a  lymphedema index baseline score. The patient will be  reassessed on a regular basis (typically every 3 months) to obtain new L-Dex scores. If the score is > 6.5 points away from his/her baseline score indicating onset of subclinical lymphedema, it will be recommended to wear a compression garment for 4 weeks, 12 hours per day and then be reassessed. If the score continues to be > 6.5 points from baseline at reassessment, we will initiate lymphedema treatment. Assessing in this manner has a 95% rate of preventing clinically significant lymphedema.     QUICK DASH SURVEY: 66%                                                                                                                            TREATMENT DATE:  05/22/23: Therapeutic Exercises Pulleys into flex and abd x 2 mins each with VC's to decrease Lt scapular compensation Roll yellow ball up wall into flex and abd x 10 each returning therapist demo Standing AA/ROM flex, Lt abd and Lt er with dowel rod x 5-7 reps each, holding 5 sec; then same in supine x 10 reps, 5 sec holds and VC's not to bounce at end ROMs. Handout issued Manual Therapy P/ROM in supine to Lt shoulder into flex, abd and D2 with scapular depression throughout STM with cocoa butter to Lt pect tendon and then into Lt S/L to lateral trunk and periscapular area Scap Mobs into protraction and retraction   05/17/2023: Manual Therapy P/ROM in supine to Lt shoulder into flex, abd and D2 with scapular depression throughout STM with cocoa butter to Lt pect tendon and then into Lt S/L to lateral trunk and periscapular area Scap Mobs into protraction and retraction  Therapeutic Exercises Reviewed post op exs issued at last session. Pt did well except for VC's to remind pt to hold stretches and not bounce at end of motions. Pulleys into flex x 2 mins   05/10/2023 Educated in 4 post op exercises 2x/day with 5 sec hold taking to the point of stretch and not shoulder pain. Pt to do  flexion and stargazer in supine.    PATIENT EDUCATION:  Education details: Standing and supine dowel exercises Person educated: Patient Education method: Explanation, Demonstration, and Handouts, verbal and tactile cues Education comprehension: verbalized understanding and returned demonstration, and will benefit from further review  HOME EXERCISE PROGRAM: 4 post op exercises Dowel exercises in standing and supine  ASSESSMENT:  CLINICAL IMPRESSION: Continued with AA/ROM with pulleys  and then added ball roll up wall. Progressed HEP to include AA/ROM exs with dowel in standing and supine. Pt reported feeling good stretches with these but was encouraged not to push into pain. Also continued with manual therapy working to improve end Lt shoulder ROM.    OBJECTIVE IMPAIRMENTS: decreased activity tolerance, decreased knowledge of condition, decreased ROM, decreased strength, hypomobility, increased fascial restrictions, impaired flexibility, impaired UE functional use, postural dysfunction, and pain.   ACTIVITY LIMITATIONS: lifting, reach over head, and hygiene/grooming  PARTICIPATION LIMITATIONS: driving, occupation, and  anything requiring overhead reaching  PERSONAL FACTORS: 3+ comorbidities: Triple Negative breast Cancer s/p chemo and radiation  are also affecting patient's functional outcome.   REHAB POTENTIAL: Good  CLINICAL DECISION MAKING: Stable/uncomplicated  EVALUATION COMPLEXITY: Low  GOALS: Goals reviewed with patient? Yes  SHORT TERM GOALS=LONG TERM GOALS: Target date: 06/07/2023  Pt will be independent with a HEP to improve left shoulder ROM and strength Baseline: Goal status: INITIAL  2.  Pt will improve left shoulder flexion to atleast 145 degrees for improved reaching Baseline: 132 Goal status: INITIAL  3.  Pt will improve left shoulder abd to atleast 130 degrees   with 0-min UT compensation Baseline: 70 Goal status: INITIAL  4.  Left UE pain will be  decreased by 50% or greater Baseline:  Goal status: INITIAL  5.  Quick dash will be improved to no greater than 30% to demonstrate improved function Baseline:  Goal status: INITIAL   PLAN:  PT FREQUENCY: 2x/week  PT DURATION: 4 weeks  PLANNED INTERVENTIONS: 97164- PT Re-evaluation, 97110-Therapeutic exercises, 97530- Therapeutic activity, 97112- Neuromuscular re-education, 97535- Self Care, [id number]- Manual therapy, 97760- Orthotic Fit/training, Patient/Family education, Balance training, Dry Needling, and Joint mobilization  PLAN FOR NEXT SESSION: Review new HEP and assess technique. Cont STM prn to pecs/lateral trunk,  DN prn,UT prn, joint mobs, review HEP; try wand flexion and scaption, PROM, decrease UT compensation, progress to strength as ROM improves.  Aden Berwyn Caldron, PTA 05/22/2023, 1:40 PM   SHOULDER: Flexion - Supine (Cane)        Cancer Rehab [phone]    Hold cane in both hands. Raise arms up overhead. Do not allow back to arch. Hold _5__ seconds. Do __5-10__ times; __1-2__ times a day.   SELF ASSISTED WITH OBJECT: Shoulder Abduction / Adduction - Supine    Hold cane with both hands. Move both arms from side to side, keep elbows straight.  Hold when stretch felt for __5__ seconds. Repeat __5-10__ times; __1-2__ times a day. Once this becomes easier progress to third picture bringing affected arm towards ear by staying out to side. Same hold for _5_seconds. Repeat  _5-10_ times, _1-2_ times/day.   SHOULDER: External Rotation - Supine (Cane)    Hold cane with both hands. Rotate arm away from body. Keep elbow on floor and next to body. _5-10__ reps per set, hold 5 seconds, _1-2__ sets per day. Add towel to keep elbow at side.   Flexion (Eccentric) - Active-Assist (Cane)              Use unaffected arm to push affected arm forward. Avoid hiking shoulder (shoulder should NOT touch cheek). Keep palm relaxed. Slowly lower affected arm. Hold stretch for _5_  seconds repeating _5-10_ times, _1-2_ times a day.  Abduction (Eccentric) - Active-Assist (Cane)    Use unaffected arm to push affected arm out to side. Avoid hiking shoulder (shoulder should NOT touch cheek). Keep palm relaxed. Slowly lower affected arm. Hold stretch _5_ seconds repeating _5-10_ times, _1-2_ times a day.

## 2023-05-24 ENCOUNTER — Ambulatory Visit: Payer: Medicare Other

## 2023-05-24 DIAGNOSIS — I890 Lymphedema, not elsewhere classified: Secondary | ICD-10-CM

## 2023-05-24 DIAGNOSIS — Z483 Aftercare following surgery for neoplasm: Secondary | ICD-10-CM | POA: Diagnosis not present

## 2023-05-24 DIAGNOSIS — C50412 Malignant neoplasm of upper-outer quadrant of left female breast: Secondary | ICD-10-CM

## 2023-05-24 DIAGNOSIS — M25612 Stiffness of left shoulder, not elsewhere classified: Secondary | ICD-10-CM

## 2023-05-24 DIAGNOSIS — M25512 Pain in left shoulder: Secondary | ICD-10-CM

## 2023-05-24 NOTE — Therapy (Signed)
OUTPATIENT PHYSICAL THERAPY  UPPER EXTREMITY ONCOLOGY TREATMENT  Patient Name: Lindsay [name] MRN: [id number] DOB:November 25, 1955, 68 y.o., female Today's Date: 05/24/2023  END OF SESSION:  PT End of Session - 05/24/23 1353     Visit Number 4    Number of Visits 8    Date for PT Re-Evaluation 06/07/23    PT Start Time 1400    PT Stop Time 1454    PT Time Calculation (min) 54 min    Activity Tolerance Patient tolerated treatment well    Behavior During Therapy WFL for tasks assessed/performed             Past Medical History:  Diagnosis Date   Breast CA (HCC)    Bronchitis    Family history of bone cancer    Family history of breast cancer    Family history of prostate cancer    Fibromyalgia    HCV (hepatitis C virus)    MRSA (methicillin resistant Staphylococcus aureus) 2012   Shingles    Past Surgical History:  Procedure Laterality Date   MASTECTOMY MODIFIED RADICAL Left 12/26/2019   Procedure: LEFT MASTECTOMY MODIFIED RADICAL;  Surgeon: Griselda Miner, MD;  Location: [facility] OR;  Service: General;  Laterality: Left;  PEC BLOCK   PORTACATH PLACEMENT Right 12/26/2019   Procedure: INSERTION PORT-A-CATH WITH ULTRASOUND GUIDANCE;  Surgeon: Griselda Miner, MD;  Location: [facility] OR;  Service: General;  Laterality: Right;   Patient Active Problem List   Diagnosis Date Noted   Chemotherapy induced neutropenia (HCC) 03/31/2020   Port-A-Cath in place 02/04/2020   Family history of breast cancer    Family history of prostate cancer    Family history of bone cancer    Malignant neoplasm of upper-outer quadrant of left breast in female, estrogen receptor negative (HCC) 10/31/2019      REFERRING PROVIDER: Malachy Mood, MD  REFERRING DIAG: Left arm/shoulder pain after surgery  THERAPY DIAG:  Malignant neoplasm of upper-outer quadrant of left breast in female, estrogen receptor negative (HCC)  Aftercare following surgery for neoplasm  Left shoulder pain, unspecified  chronicity  Stiffness of left shoulder, not elsewhere classified  Secondary lymphedema  ONSET DATE: 9 months  Rationale for Evaluation and Treatment: Rehabilitation  SUBJECTIVE:                                                                                                                                                                                           SUBJECTIVE  I tried the new exercises. They are OK but I get stiff after 20 min.  8/10 pain today but it does improve some after exercises  PERTINENT HISTORY: . 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 Patient underwent a left modified radical mastectomy on 12/26/2019 with 13 of 16 were positive for cancer. The Ki67 is 10%. There may be some cancer attaching the LN to the visceral vein but no new diagnositics have been done.   PAIN:  Are you having pain? Yes NPRS scale: 8/10  Pain location: left shoulder/underarm Pain orientation: Left  PAIN TYPE: aching and burning Pain description: constant  Aggravating factors: not moving, 1st thing in am,can't lay on left Relieving factors: movement, frankincense, tylenol, lidocaine cream  PRECAUTIONS: Left UE lymphedema risk  RED FLAGS: None   WEIGHT BEARING RESTRICTIONS: No  FALLS:  Has patient fallen in last 6 months? Yes. 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  COGNITION: Overall cognitive status: Within functional limits for tasks assessed   PALPATION: Tight in left pectorals especially axillary border with tenderness, several cords palpable in axillary region, but not easily  visible  OBSERVATIONS / OTHER ASSESSMENTS: Wearing lymphdivas 30-40 sleeve, left UT compensation with reaching activities  SENSATION: Light touch: Deficits    POSTURE: forward head, rounded shoulders  UPPER EXTREMITY AROM/PROM:  A/PROM RIGHT   eval   Shoulder extension 60  Shoulder flexion 154  Shoulder abduction 165  Shoulder internal rotation 78  Shoulder external rotation 100    (Blank rows = not tested)  A/PROM LEFT   eval  Shoulder extension 48 pain  Shoulder flexion 132 Pain  Shoulder abduction 70 Pain  Shoulder internal rotation   Shoulder external rotation     (Blank rows = not tested)  CERVICAL AROM: All within functional limits:      UPPER EXTREMITY STRENGTH:   LYMPHEDEMA ASSESSMENTS:   SURGERY TYPE/DATE: left modified radical mastectomy on 12/26/2019   NUMBER OF LYMPH NODES REMOVED: 13 of 16 were positive for cancer  CHEMOTHERAPY: YES  RADIATION:YES 08/24/20-10/05/20  HORMONE TREATMENT: NO  INFECTIONS:    LYMPHEDEMA ASSESSMENTS:   LANDMARK RIGHT  eval  At axilla  34.5  15 cm proximal to olecranon process   10 cm proximal to olecranon process 31.9  Olecranon process 25.5  15 cm proximal to ulnar styloid process   10 cm proximal to ulnar styloid process 22.8  Just proximal to ulnar styloid process 16.1  Across hand at thumb web space 19.6  At base of 2nd digit 6.7  (Blank rows = not tested)  LANDMARK LEFT  eval  At axilla    15 cm proximal to olecranon process   10 cm proximal to olecranon process 32.5  Olecranon process 24.4  15 cm proximal to ulnar styloid process   10 cm proximal to ulnar styloid process 21.5  Just proximal to ulnar styloid process 16.1  Across hand at thumb web space 19.3  At base of 2nd digit 6.6  (Blank rows = not tested)   FUNCTIONAL TESTS:    GAIT:WNL   L-DEX LYMPHEDEMA SCREENING: The patient was assessed using the L-Dex machine today to produce a lymphedema index baseline score. The patient will  be reassessed on a regular basis (typically every 3 months) to obtain new L-Dex scores. If  the score is > 6.5 points away from his/her baseline score indicating onset of subclinical lymphedema, it will be recommended to wear a compression garment for 4 weeks, 12 hours per day and then be reassessed. If the score continues to be > 6.5 points from baseline at reassessment, we will initiate lymphedema treatment. Assessing in this manner has a 95% rate of preventing clinically significant lymphedema.     QUICK DASH SURVEY: 66%                                                                                                                            TREATMENT DATE:  05/24/2023 Pulleys into flex and abd x 2 mins each with VC's to decrease Lt scapular compensation Roll yellow ball up wall into flex and abd x 10 each returning therapist demo Standing AA/ROM flex, Lt abd and Lt er with dowel rod x 5-7 reps each, holding 5 sec; then same in supine x 5 reps, 5 sec holds Supine bilateral AROM flexion, scaption, horizontal abd x 5, snow angel partial x 5 with asist to keep elbow straight Manual Therapy P/ROM in supine to Lt shoulder into flex, abd and D2 with scapular depression throughout STM with cocoa butter to Lt pect tendon and then into Lt S/L to lateral trunk and periscapular area GH mobs Grade 3/4 post and inferior  05/22/23: Therapeutic Exercises Pulleys into flex and abd x 2 mins each with VC's to decrease Lt scapular compensation Roll yellow ball up wall into flex and abd x 10 each returning therapist demo Standing AA/ROM flex, Lt abd and Lt er with dowel rod x 5-7 reps each, holding 5 sec; then same in supine x 10 reps, 5 sec holds and VC's not to bounce at end ROMs. Handout issued Manual Therapy P/ROM in supine to Lt shoulder into flex, abd and D2 with scapular depression throughout STM with cocoa butter to Lt pect tendon and then into Lt S/L to lateral trunk and periscapular area Scap Mobs  into protraction and retraction   05/17/2023: Manual Therapy P/ROM in supine to Lt shoulder into flex, abd and D2 with scapular depression throughout STM with cocoa butter to Lt pect tendon and then into Lt S/L to lateral trunk and periscapular area Scap Mobs into protraction and retraction  Therapeutic Exercises Reviewed post op exs issued at last session. Pt did well except for VC's to remind pt to hold stretches and not bounce at end of motions. Pulleys into flex x 2 mins   05/10/2023 Educated in 4 post op exercises 2x/day with 5 sec hold taking to the point of stretch and not shoulder pain. Pt to do flexion and stargazer in supine.    PATIENT EDUCATION:  Education details: Standing and supine dowel exercises Person educated: Patient Education method: Explanation, Demonstration, and Handouts, verbal and tactile cues Education comprehension: verbalized understanding and returned demonstration, and will benefit from further review  HOME EXERCISE PROGRAM: 4 post op exercises  Dowel exercises in standing and supine  ASSESSMENT:  CLINICAL IMPRESSION: Pts ROM visibly improving. Did well with AROM, but struggled to keep elbow straight with AAROM abd and  snow angels. Pt felt good after treatment today.  OBJECTIVE IMPAIRMENTS: decreased activity tolerance, decreased knowledge of condition, decreased ROM, decreased strength, hypomobility, increased fascial restrictions, impaired flexibility, impaired UE functional use, postural dysfunction, and pain.   ACTIVITY LIMITATIONS: lifting, reach over head, and hygiene/grooming  PARTICIPATION LIMITATIONS: driving, occupation, and anything requiring overhead reaching  PERSONAL FACTORS: 3+ comorbidities: Triple Negative breast Cancer s/p chemo and radiation  are also affecting patient's functional outcome.   REHAB POTENTIAL: Good  CLINICAL DECISION MAKING: Stable/uncomplicated  EVALUATION COMPLEXITY: Low  GOALS: Goals reviewed with patient?  Yes  SHORT TERM GOALS=LONG TERM GOALS: Target date: 06/07/2023  Pt will be independent with a HEP to improve left shoulder ROM and strength Baseline: Goal status: INITIAL  2.  Pt will improve left shoulder flexion to atleast 145 degrees for improved reaching Baseline: 132 Goal status: INITIAL  3.  Pt will improve left shoulder abd to atleast 130 degrees   with 0-min UT compensation Baseline: 70 Goal status: INITIAL  4.  Left UE pain will be decreased by 50% or greater Baseline:  Goal status: INITIAL  5.  Quick dash will be improved to no greater than 30% to demonstrate improved function Baseline:  Goal status: INITIAL   PLAN:  PT FREQUENCY: 2x/week  PT DURATION: 4 weeks  PLANNED INTERVENTIONS: 97164- PT Re-evaluation, 97110-Therapeutic exercises, 97530- Therapeutic activity, 97112- Neuromuscular re-education, 97535- Self Care, 82956- Manual therapy, 97760- Orthotic Fit/training, Patient/Family education, Balance training, Dry Needling, and Joint mobilization  PLAN FOR NEXT SESSION: Review new HEP and assess technique. Cont STM prn to pecs/lateral trunk,  DN prn,UT prn, joint mobs, review HEP; try wand flexion and scaption, PROM, decrease UT compensation, progress to strength as ROM improves.  Waynette Buttery, PT 05/24/2023, 3:07 PM   SHOULDER: Flexion - Supine (Cane)        Cancer Rehab [phone]    Hold cane in both hands. Raise arms up overhead. Do not allow back to arch. Hold _5__ seconds. Do __5-10__ times; __1-2__ times a day.   SELF ASSISTED WITH OBJECT: Shoulder Abduction / Adduction - Supine    Hold cane with both hands. Move both arms from side to side, keep elbows straight.  Hold when stretch felt for __5__ seconds. Repeat __5-10__ times; __1-2__ times a day. Once this becomes easier progress to third picture bringing affected arm towards ear by staying out to side. Same hold for _5_seconds. Repeat  _5-10_ times, _1-2_ times/day.   SHOULDER: External  Rotation - Supine (Cane)    Hold cane with both hands. Rotate arm away from body. Keep elbow on floor and next to body. _5-10__ reps per set, hold 5 seconds, _1-2__ sets per day. Add towel to keep elbow at side.   Flexion (Eccentric) - Active-Assist (Cane)              Use unaffected arm to push affected arm forward. Avoid hiking shoulder (shoulder should NOT touch cheek). Keep palm relaxed. Slowly lower affected arm. Hold stretch for _5_ seconds repeating _5-10_ times, _1-2_ times a day.  Abduction (Eccentric) - Active-Assist (Cane)    Use unaffected arm to push affected arm out to side. Avoid hiking shoulder (shoulder should NOT touch cheek). Keep palm relaxed. Slowly lower affected arm. Hold stretch _5_ seconds repeating _5-10_ times, _1-2_ times a day.

## 2023-05-29 ENCOUNTER — Inpatient Hospital Stay: Payer: Medicare Other | Attending: Nurse Practitioner

## 2023-05-29 ENCOUNTER — Ambulatory Visit: Payer: Medicare Other

## 2023-05-29 VITALS — BP 122/69 | HR 84 | Temp 98.5°F | Resp 18

## 2023-05-29 DIAGNOSIS — I890 Lymphedema, not elsewhere classified: Secondary | ICD-10-CM

## 2023-05-29 DIAGNOSIS — Z452 Encounter for adjustment and management of vascular access device: Secondary | ICD-10-CM | POA: Diagnosis not present

## 2023-05-29 DIAGNOSIS — Z171 Estrogen receptor negative status [ER-]: Secondary | ICD-10-CM

## 2023-05-29 DIAGNOSIS — M25512 Pain in left shoulder: Secondary | ICD-10-CM

## 2023-05-29 DIAGNOSIS — Z08 Encounter for follow-up examination after completed treatment for malignant neoplasm: Secondary | ICD-10-CM | POA: Insufficient documentation

## 2023-05-29 DIAGNOSIS — Z483 Aftercare following surgery for neoplasm: Secondary | ICD-10-CM

## 2023-05-29 DIAGNOSIS — M25612 Stiffness of left shoulder, not elsewhere classified: Secondary | ICD-10-CM

## 2023-05-29 DIAGNOSIS — Z853 Personal history of malignant neoplasm of breast: Secondary | ICD-10-CM | POA: Diagnosis present

## 2023-05-29 DIAGNOSIS — Z95828 Presence of other vascular implants and grafts: Secondary | ICD-10-CM

## 2023-05-29 LAB — CBC WITH DIFFERENTIAL (CANCER CENTER ONLY)
Abs Immature Granulocytes: 0.01 10*3/uL (ref 0.00–0.07)
Basophils Absolute: 0.1 10*3/uL (ref 0.0–0.1)
Basophils Relative: 1 %
Eosinophils Absolute: 0.3 10*3/uL (ref 0.0–0.5)
Eosinophils Relative: 5 %
HCT: 38.9 % (ref 36.0–46.0)
Hemoglobin: 13.5 g/dL (ref 12.0–15.0)
Immature Granulocytes: 0 %
Lymphocytes Relative: 31 %
Lymphs Abs: 2 10*3/uL (ref 0.7–4.0)
MCH: 32.1 pg (ref 26.0–34.0)
MCHC: 34.7 g/dL (ref 30.0–36.0)
MCV: 92.6 fL (ref 80.0–100.0)
Monocytes Absolute: 0.5 10*3/uL (ref 0.1–1.0)
Monocytes Relative: 8 %
Neutro Abs: 3.5 10*3/uL (ref 1.7–7.7)
Neutrophils Relative %: 55 %
Platelet Count: 158 10*3/uL (ref 150–400)
RBC: 4.2 MIL/uL (ref 3.87–5.11)
RDW: 12.3 % (ref 11.5–15.5)
WBC Count: 6.5 10*3/uL (ref 4.0–10.5)
nRBC: 0 % (ref 0.0–0.2)

## 2023-05-29 LAB — CMP (CANCER CENTER ONLY)
ALT: 14 U/L (ref 0–44)
AST: 37 U/L (ref 15–41)
Albumin: 4 g/dL (ref 3.5–5.0)
Alkaline Phosphatase: 70 U/L (ref 38–126)
Anion gap: 5 (ref 5–15)
BUN: 13 mg/dL (ref 8–23)
CO2: 30 mmol/L (ref 22–32)
Calcium: 9.3 mg/dL (ref 8.9–10.3)
Chloride: 104 mmol/L (ref 98–111)
Creatinine: 0.6 mg/dL (ref 0.44–1.00)
GFR, Estimated: >60 mL/min (ref >60)
Glucose, Bld: 119 mg/dL — ABNORMAL HIGH (ref 70–99)
Potassium: 3.6 mmol/L (ref 3.5–5.1)
Sodium: 139 mmol/L (ref 135–145)
Total Bilirubin: 0.6 mg/dL (ref 0.0–1.2)
Total Protein: 6.1 g/dL — ABNORMAL LOW (ref 6.5–8.1)

## 2023-05-29 MED ORDER — SODIUM CHLORIDE 0.9% FLUSH
10.0000 mL | Freq: Once | INTRAVENOUS | Status: AC
  Administered 2023-05-29: 10 mL

## 2023-05-29 MED ORDER — HEPARIN SOD (PORK) LOCK FLUSH 100 UNIT/ML IV SOLN
500.0000 [IU] | Freq: Once | INTRAVENOUS | Status: AC
Start: 2023-05-29 — End: 2023-05-29
  Administered 2023-05-29: 500 [IU]

## 2023-05-29 NOTE — Progress Notes (Signed)
CMP stable

## 2023-05-29 NOTE — Therapy (Signed)
OUTPATIENT PHYSICAL THERAPY  UPPER EXTREMITY ONCOLOGY TREATMENT  Patient Name: Lindsay [name] MRN: [id number] DOB:Dec 18, 1955, 68 y.o., female Today's Date: 05/29/2023  END OF SESSION:  PT End of Session - 05/29/23 1351     Visit Number 5    Number of Visits 8    Date for PT Re-Evaluation 06/07/23    PT Start Time 1355    PT Stop Time 1500    PT Time Calculation (min) 65 min    Activity Tolerance Patient tolerated treatment well    Behavior During Therapy WFL for tasks assessed/performed             Past Medical History:  Diagnosis Date   Breast CA (HCC)    Bronchitis    Family history of bone cancer    Family history of breast cancer    Family history of prostate cancer    Fibromyalgia    HCV (hepatitis C virus)    MRSA (methicillin resistant Staphylococcus aureus) 2012   Shingles    Past Surgical History:  Procedure Laterality Date   MASTECTOMY MODIFIED RADICAL Left 12/26/2019   Procedure: LEFT MASTECTOMY MODIFIED RADICAL;  Surgeon: Griselda Miner, MD;  Location: [facility] OR;  Service: General;  Laterality: Left;  PEC BLOCK   PORTACATH PLACEMENT Right 12/26/2019   Procedure: INSERTION PORT-A-CATH WITH ULTRASOUND GUIDANCE;  Surgeon: Griselda Miner, MD;  Location: [facility] OR;  Service: General;  Laterality: Right;   Patient Active Problem List   Diagnosis Date Noted   Chemotherapy induced neutropenia (HCC) 03/31/2020   Port-A-Cath in place 02/04/2020   Family history of breast cancer    Family history of prostate cancer    Family history of bone cancer    Malignant neoplasm of upper-outer quadrant of left breast in female, estrogen receptor negative (HCC) 10/31/2019      REFERRING PROVIDER: Malachy Mood, MD  REFERRING DIAG: Left arm/shoulder pain after surgery  THERAPY DIAG:  Malignant neoplasm of upper-outer quadrant of left breast in female, estrogen receptor negative (HCC)  Aftercare following surgery for neoplasm  Left shoulder pain, unspecified  chronicity  Stiffness of left shoulder, not elsewhere classified  Secondary lymphedema  ONSET DATE: 9 months  Rationale for Evaluation and Treatment: Rehabilitation  SUBJECTIVE:                                                                                                                                                                                           SUBJECTIVE I get achy if I don't move my shoulder about every 20-30 mins. It gets really stiff. PERTINENT HISTORY: . 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 Patient underwent a left modified radical mastectomy on 12/26/2019 with 13 of 16 were positive for cancer. The Ki67 is 10%. There may be some cancer attaching the LN to the visceral vein but no new diagnositics have been done.   PAIN:  Are you having pain? Yes NPRS scale:1-2/10 best  8/10  Pain location: left shoulder/underarm Pain orientation: Left  PAIN TYPE: aching and burning Pain description: constant  Aggravating factors: not moving, 1st thing in am,can't lay on left Relieving factors: movement, frankincense, tylenol, lidocaine cream  PRECAUTIONS: Left UE lymphedema risk  RED FLAGS: None   WEIGHT BEARING RESTRICTIONS: No  FALLS:  Has patient fallen in last 6 months? Yes. 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  COGNITION: Overall cognitive status: Within functional limits for tasks assessed   PALPATION: Tight in left pectorals especially axillary border with tenderness, several cords palpable in axillary region, but not easily visible  OBSERVATIONS / OTHER ASSESSMENTS:  Wearing lymphdivas 30-40 sleeve, left UT compensation with reaching activities  SENSATION: Light touch: Deficits    POSTURE: forward head, rounded shoulders  UPPER EXTREMITY AROM/PROM:  A/PROM RIGHT   eval   Shoulder extension 60  Shoulder flexion 154  Shoulder abduction 165  Shoulder internal rotation 78  Shoulder external rotation 100    (Blank rows = not tested)  A/PROM LEFT   eval LEFT 05/29/2023  Shoulder extension 48 pain 54  Shoulder flexion 132 Pain 142  Shoulder abduction 70 Pain 127  Shoulder internal rotation    Shoulder external rotation      (Blank rows = not tested)  CERVICAL AROM: All within functional limits:      UPPER EXTREMITY STRENGTH:   LYMPHEDEMA ASSESSMENTS:   SURGERY TYPE/DATE: left modified radical mastectomy on 12/26/2019   NUMBER OF LYMPH NODES REMOVED: 13 of 16 were positive for cancer  CHEMOTHERAPY: YES  RADIATION:YES 08/24/20-10/05/20  HORMONE TREATMENT: NO  INFECTIONS:    LYMPHEDEMA ASSESSMENTS:   LANDMARK RIGHT  eval  At axilla  34.5  15 cm proximal to olecranon process   10 cm proximal to olecranon process 31.9  Olecranon process 25.5  15 cm proximal to ulnar styloid process   10 cm proximal to ulnar styloid process 22.8  Just proximal to ulnar styloid process 16.1  Across hand at thumb web space 19.6  At base of 2nd digit 6.7  (Blank rows = not tested)  LANDMARK LEFT  eval  At axilla    15 cm proximal to olecranon process   10 cm proximal to olecranon process 32.5  Olecranon process 24.4  15 cm proximal to ulnar styloid process   10 cm proximal to ulnar styloid process 21.5  Just proximal to ulnar styloid process 16.1  Across hand at thumb web space 19.3  At base of 2nd digit 6.6  (Blank rows = not tested)   FUNCTIONAL TESTS:    GAIT:WNL   L-DEX LYMPHEDEMA SCREENING: The patient was assessed using the L-Dex machine today to produce a lymphedema index baseline score. The patient will be reassessed on  a regular basis (typically every 3 months) to obtain new L-Dex scores. If the score  is > 6.5 points away from his/her baseline score indicating onset of subclinical lymphedema, it will be recommended to wear a compression garment for 4 weeks, 12 hours per day and then be reassessed. If the score continues to be > 6.5 points from baseline at reassessment, we will initiate lymphedema treatment. Assessing in this manner has a 95% rate of preventing clinically significant lymphedema.     QUICK DASH SURVEY: 66%                                                                                                                            TREATMENT DATE:   05/29/2023 Pulleys into flex and abd x 2 mins each with VC's to decrease Lt scapular compensation Roll yellow ball up wall into flex and abd x 10 each returning therapist demo Supine wand flex and scaption x 4 ea Supine bilateral AROM flexion, scaption, horizontal abd x 5, snow angel partial x 5 with asist to keep elbow straight Manual Therapy P/ROM in supine to Lt shoulder into flex, abd and D2 with scapular depression throughout STM with cocoa butter to Lt pect tendon and then into Lt S/L to lateral trunk and periscapular area GH mobs Grade 3/4 post and inferior 05/24/2023 Pulleys into flex and abd x 2 mins each with VC's to decrease Lt scapular compensation Roll yellow ball up wall into flex and abd x 10 each returning therapist demo Standing AA/ROM flex, Lt abd and Lt er with dowel rod x 5-7 reps each, holding 5 sec; then same in supine x 5 reps, 5 sec holds Supine bilateral AROM flexion, scaption, horizontal abd x 5, snow angel partial x 5 with asist to keep elbow straight Manual Therapy P/ROM in supine to Lt shoulder into flex, abd and D2 with scapular depression throughout STM with cocoa butter to Lt pect tendon and then into Lt S/L to lateral trunk and periscapular area GH mobs Grade 3/4 post and inferior  05/22/23: Therapeutic  Exercises Pulleys into flex and abd x 2 mins each with VC's to decrease Lt scapular compensation Roll yellow ball up wall into flex and abd x 10 each returning therapist demo Standing AA/ROM flex, Lt abd and Lt er with dowel rod x 5-7 reps each, holding 5 sec; then same in supine x 10 reps, 5 sec holds and VC's not to bounce at end ROMs. Handout issued Manual Therapy P/ROM in supine to Lt shoulder into flex, abd and D2 with scapular depression throughout STM with cocoa butter to Lt pect tendon and then into Lt S/L to lateral trunk and periscapular area Scap Mobs into protraction and retraction   05/17/2023: Manual Therapy P/ROM in supine to Lt shoulder into flex, abd and D2 with scapular depression throughout STM with cocoa butter to Lt pect tendon and then into Lt S/L to lateral trunk and periscapular area Scap Mobs into protraction and retraction  Therapeutic Exercises Reviewed post op exs issued at last session. Pt did  well except for VC's to remind pt to hold stretches and not bounce at end of motions. Pulleys into flex x 2 mins   05/10/2023 Educated in 4 post op exercises 2x/day with 5 sec hold taking to the point of stretch and not shoulder pain. Pt to do flexion and stargazer in supine.    PATIENT EDUCATION:  Education details: Standing and supine dowel exercises Person educated: Patient Education method: Explanation, Demonstration, and Handouts, verbal and tactile cues Education comprehension: verbalized understanding and returned demonstration, and will benefit from further review  HOME EXERCISE PROGRAM: 4 post op exercises Dowel exercises in standing and supine  ASSESSMENT:  CLINICAL IMPRESSION: Good improvement in left shoulder ROM. Flexion improved 10 deg and abd improved 57 degrees. Pt still complains of achiness/stiffness after sitting 20-30 minutes  OBJECTIVE IMPAIRMENTS: decreased activity tolerance, decreased knowledge of condition, decreased ROM, decreased  strength, hypomobility, increased fascial restrictions, impaired flexibility, impaired UE functional use, postural dysfunction, and pain.   ACTIVITY LIMITATIONS: lifting, reach over head, and hygiene/grooming  PARTICIPATION LIMITATIONS: driving, occupation, and anything requiring overhead reaching  PERSONAL FACTORS: 3+ comorbidities: Triple Negative breast Cancer s/p chemo and radiation  are also affecting patient's functional outcome.   REHAB POTENTIAL: Good  CLINICAL DECISION MAKING: Stable/uncomplicated  EVALUATION COMPLEXITY: Low  GOALS: Goals reviewed with patient? Yes  SHORT TERM GOALS=LONG TERM GOALS: Target date: 06/07/2023  Pt will be independent with a HEP to improve left shoulder ROM and strength Baseline: Goal status: MET 05/29/2023 2.  Pt will improve left shoulder flexion to atleast 145 degrees for improved reaching Baseline: 132 Goal status: INITIAL  3.  Pt will improve left shoulder abd to atleast 130 degrees   with 0-min UT compensation Baseline: 70 Goal status: INITIAL  4.  Left UE pain will be decreased by 50% or greater Baseline:  Goal status: INITIAL  5.  Quick dash will be improved to no greater than 30% to demonstrate improved function Baseline:  Goal status: INITIAL   PLAN:  PT FREQUENCY: 2x/week  PT DURATION: 4 weeks  PLANNED INTERVENTIONS: 97164- PT Re-evaluation, 97110-Therapeutic exercises, 97530- Therapeutic activity, 97112- Neuromuscular re-education, 97535- Self Care, 16109- Manual therapy, 97760- Orthotic Fit/training, Patient/Family education, Balance training, Dry Needling, and Joint mobilization  PLAN FOR NEXT SESSION: Review new HEP and assess technique. Cont STM prn to pecs/lateral trunk,  DN prn,UT prn, joint mobs, review HEP; try wand flexion and scaption, PROM, decrease UT compensation, progress to strength as ROM improves.  Waynette Buttery, PT 05/29/2023, 3:01 PM   SHOULDER: Flexion - Supine (Cane)        Cancer Rehab  [phone]    Hold cane in both hands. Raise arms up overhead. Do not allow back to arch. Hold _5__ seconds. Do __5-10__ times; __1-2__ times a day.   SELF ASSISTED WITH OBJECT: Shoulder Abduction / Adduction - Supine    Hold cane with both hands. Move both arms from side to side, keep elbows straight.  Hold when stretch felt for __5__ seconds. Repeat __5-10__ times; __1-2__ times a day. Once this becomes easier progress to third picture bringing affected arm towards ear by staying out to side. Same hold for _5_seconds. Repeat  _5-10_ times, _1-2_ times/day.   SHOULDER: External Rotation - Supine (Cane)    Hold cane with both hands. Rotate arm away from body. Keep elbow on floor and next to body. _5-10__ reps per set, hold 5 seconds, _1-2__ sets per day. Add towel to keep elbow at side.  Flexion (Eccentric) - Active-Assist (Cane)              Use unaffected arm to push affected arm forward. Avoid hiking shoulder (shoulder should NOT touch cheek). Keep palm relaxed. Slowly lower affected arm. Hold stretch for _5_ seconds repeating _5-10_ times, _1-2_ times a day.  Abduction (Eccentric) - Active-Assist (Cane)    Use unaffected arm to push affected arm out to side. Avoid hiking shoulder (shoulder should NOT touch cheek). Keep palm relaxed. Slowly lower affected arm. Hold stretch _5_ seconds repeating _5-10_ times, _1-2_ times a day.

## 2023-05-31 ENCOUNTER — Ambulatory Visit: Payer: Medicare Other

## 2023-05-31 DIAGNOSIS — M25612 Stiffness of left shoulder, not elsewhere classified: Secondary | ICD-10-CM

## 2023-05-31 DIAGNOSIS — Z171 Estrogen receptor negative status [ER-]: Secondary | ICD-10-CM

## 2023-05-31 DIAGNOSIS — I890 Lymphedema, not elsewhere classified: Secondary | ICD-10-CM

## 2023-05-31 DIAGNOSIS — Z483 Aftercare following surgery for neoplasm: Secondary | ICD-10-CM | POA: Diagnosis not present

## 2023-05-31 DIAGNOSIS — M25512 Pain in left shoulder: Secondary | ICD-10-CM

## 2023-05-31 NOTE — Therapy (Signed)
OUTPATIENT PHYSICAL THERAPY  UPPER EXTREMITY ONCOLOGY TREATMENT  Patient Name: Lindsay [name] MRN: [id number] DOB:02/28/1956, 68 y.o., female Today's Date: 05/31/2023  END OF SESSION:  PT End of Session - 05/31/23 1109     Visit Number 6    Number of Visits 8    Date for PT Re-Evaluation 06/07/23    PT Start Time 1105    PT Stop Time 1200    PT Time Calculation (min) 55 min    Activity Tolerance Patient tolerated treatment well    Behavior During Therapy WFL for tasks assessed/performed             Past Medical History:  Diagnosis Date   Breast CA (HCC)    Bronchitis    Family history of bone cancer    Family history of breast cancer    Family history of prostate cancer    Fibromyalgia    HCV (hepatitis C virus)    MRSA (methicillin resistant Staphylococcus aureus) 2012   Shingles    Past Surgical History:  Procedure Laterality Date   MASTECTOMY MODIFIED RADICAL Left 12/26/2019   Procedure: LEFT MASTECTOMY MODIFIED RADICAL;  Surgeon: Griselda Miner, MD;  Location: [facility] OR;  Service: General;  Laterality: Left;  PEC BLOCK   PORTACATH PLACEMENT Right 12/26/2019   Procedure: INSERTION PORT-A-CATH WITH ULTRASOUND GUIDANCE;  Surgeon: Griselda Miner, MD;  Location: [facility] OR;  Service: General;  Laterality: Right;   Patient Active Problem List   Diagnosis Date Noted   Chemotherapy induced neutropenia (HCC) 03/31/2020   Port-A-Cath in place 02/04/2020   Family history of breast cancer    Family history of prostate cancer    Family history of bone cancer    Malignant neoplasm of upper-outer quadrant of left breast in female, estrogen receptor negative (HCC) 10/31/2019      REFERRING PROVIDER: Malachy Mood, MD  REFERRING DIAG: Left arm/shoulder pain after surgery  THERAPY DIAG:  Malignant neoplasm of upper-outer quadrant of left breast in female, estrogen receptor negative (HCC)  Aftercare following surgery for neoplasm  Left shoulder pain, unspecified  chronicity  Stiffness of left shoulder, not elsewhere classified  Secondary lymphedema  ONSET DATE: 9 months  Rationale for Evaluation and Treatment: Rehabilitation  SUBJECTIVE:                                                                                                                                                                                           SUBJECTIVE I keep stretching and moving that arm.    PERTINENT HISTORY: . 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 Patient  underwent a left modified radical mastectomy on 12/26/2019 with 13 of 16 were positive for cancer. The Ki67 is 10%. There may be some cancer attaching the LN to the visceral vein but no new diagnositics have been done.   PAIN:  Are you having pain? Yes NPRS scale:1-2/10 best  8/10  Pain location: left shoulder/underarm Pain orientation: Left  PAIN TYPE: aching and burning Pain description: constant  Aggravating factors: not moving, 1st thing in am,can't lay on left Relieving factors: movement, frankincense, tylenol, lidocaine cream  PRECAUTIONS: Left UE lymphedema risk  RED FLAGS: None   WEIGHT BEARING RESTRICTIONS: No  FALLS:  Has patient fallen in last 6 months? Yes. 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  COGNITION: Overall cognitive status: Within functional limits for tasks assessed   PALPATION: Tight in left pectorals especially axillary border with tenderness, several cords palpable in axillary region, but not easily visible  OBSERVATIONS / OTHER ASSESSMENTS: Wearing lymphdivas 30-40 sleeve, left UT  compensation with reaching activities  SENSATION: Light touch: Deficits    POSTURE: forward head, rounded shoulders  UPPER EXTREMITY AROM/PROM:  A/PROM RIGHT   eval   Shoulder extension 60  Shoulder flexion 154  Shoulder abduction 165  Shoulder internal rotation 78  Shoulder external rotation 100    (Blank rows = not tested)  A/PROM LEFT   eval LEFT 05/29/2023  Shoulder extension 48 pain 54  Shoulder flexion 132 Pain 142  Shoulder abduction 70 Pain 127  Shoulder internal rotation    Shoulder external rotation      (Blank rows = not tested)  CERVICAL AROM: All within functional limits:      UPPER EXTREMITY STRENGTH:   LYMPHEDEMA ASSESSMENTS:   SURGERY TYPE/DATE: left modified radical mastectomy on 12/26/2019   NUMBER OF LYMPH NODES REMOVED: 13 of 16 were positive for cancer  CHEMOTHERAPY: YES  RADIATION:YES 08/24/20-10/05/20  HORMONE TREATMENT: NO  INFECTIONS:    LYMPHEDEMA ASSESSMENTS:   LANDMARK RIGHT  eval  At axilla  34.5  15 cm proximal to olecranon process   10 cm proximal to olecranon process 31.9  Olecranon process 25.5  15 cm proximal to ulnar styloid process   10 cm proximal to ulnar styloid process 22.8  Just proximal to ulnar styloid process 16.1  Across hand at thumb web space 19.6  At base of 2nd digit 6.7  (Blank rows = not tested)  LANDMARK LEFT  eval  At axilla    15 cm proximal to olecranon process   10 cm proximal to olecranon process 32.5  Olecranon process 24.4  15 cm proximal to ulnar styloid process   10 cm proximal to ulnar styloid process 21.5  Just proximal to ulnar styloid process 16.1  Across hand at thumb web space 19.3  At base of 2nd digit 6.6  (Blank rows = not tested)   FUNCTIONAL TESTS:    GAIT:WNL   L-DEX LYMPHEDEMA SCREENING: The patient was assessed using the L-Dex machine today to produce a lymphedema index baseline score. The patient will be reassessed on a regular basis (typically every 3  months) to obtain new L-Dex scores. If the score is > 6.5 points away from his/her  baseline score indicating onset of subclinical lymphedema, it will be recommended to wear a compression garment for 4 weeks, 12 hours per day and then be reassessed. If the score continues to be > 6.5 points from baseline at reassessment, we will initiate lymphedema treatment. Assessing in this manner has a 95% rate of preventing clinically significant lymphedema.     QUICK DASH SURVEY: 66%                                                                                                                            TREATMENT DATE:  05/31/23: Therapeutic Exercises Pulleys into flex and abd x 2 mins each with VC's to decrease Lt scapular compensation Ball roll up wall into flex and Lt abd x 10 each FreeMotion Machine 7# for scap retract x 12, and then 3# for bil UE ext x 15 each Manual Therapy P/ROM in supine to Lt shoulder into flex, abd and D2 with scapular depression throughout STM with cocoa butter to Lt pect tendon, lateran trunk and medial upper arm   05/29/2023 Pulleys into flex and abd x 2 mins each with VC's to decrease Lt scapular compensation Roll yellow ball up wall into flex and abd x 10 each returning therapist demo Supine wand flex and scaption x 4 ea Supine bilateral AROM flexion, scaption, horizontal abd x 5, snow angel partial x 5 with asist to keep elbow straight Manual Therapy P/ROM in supine to Lt shoulder into flex, abd and D2 with scapular depression throughout STM with cocoa butter to Lt pect tendon and then into Lt S/L to lateral trunk and periscapular area GH mobs Grade 3/4 post and inferior  05/24/2023 Pulleys into flex and abd x 2 mins each with VC's to decrease Lt scapular compensation Roll yellow ball up wall into flex and abd x 10 each returning therapist demo Standing AA/ROM flex, Lt abd and Lt er with dowel rod x 5-7 reps each, holding 5 sec; then same in supine x 5 reps, 5 sec  holds Supine bilateral AROM flexion, scaption, horizontal abd x 5, snow angel partial x 5 with asist to keep elbow straight Manual Therapy P/ROM in supine to Lt shoulder into flex, abd and D2 with scapular depression throughout STM with cocoa butter to Lt pect tendon and then into Lt S/L to lateral trunk and periscapular area GH mobs Grade 3/4 post and inferior  05/22/23: Therapeutic Exercises Pulleys into flex and abd x 2 mins each with VC's to decrease Lt scapular compensation Roll yellow ball up wall into flex and abd x 10 each returning therapist demo Standing AA/ROM flex, Lt abd and Lt er with dowel rod x 5-7 reps each, holding 5 sec; then same in supine x 10 reps, 5 sec holds and VC's not to bounce at end ROMs. Handout issued Manual Therapy P/ROM in supine to Lt shoulder into flex, abd and D2 with scapular depression throughout STM with cocoa butter to Lt pect tendon  and then into Lt S/L to lateral trunk and periscapular area Scap Mobs into protraction and retraction      PATIENT EDUCATION:  Education details: Standing and supine dowel exercises Person educated: Patient Education method: Explanation, Demonstration, and Handouts, verbal and tactile cues Education comprehension: verbalized understanding and returned demonstration, and will benefit from further review  HOME EXERCISE PROGRAM: 4 post op exercises Dowel exercises in standing and supine  ASSESSMENT:  CLINICAL IMPRESSION: Pt reports having no pain after each session and can tell her A/ROM of Lt shoulder is improved since starting therapy. She has been doing well with working on being consistent with her HEP. Progressed postural strength to include FreeMotion machine today. Pt reports was challenged by these but no pain.   OBJECTIVE IMPAIRMENTS: decreased activity tolerance, decreased knowledge of condition, decreased ROM, decreased strength, hypomobility, increased fascial restrictions, impaired flexibility, impaired  UE functional use, postural dysfunction, and pain.   ACTIVITY LIMITATIONS: lifting, reach over head, and hygiene/grooming  PARTICIPATION LIMITATIONS: driving, occupation, and anything requiring overhead reaching  PERSONAL FACTORS: 3+ comorbidities: Triple Negative breast Cancer s/p chemo and radiation  are also affecting patient's functional outcome.   REHAB POTENTIAL: Good  CLINICAL DECISION MAKING: Stable/uncomplicated  EVALUATION COMPLEXITY: Low  GOALS: Goals reviewed with patient? Yes  SHORT TERM GOALS=LONG TERM GOALS: Target date: 06/07/2023  Pt will be independent with a HEP to improve left shoulder ROM and strength Baseline: Goal status: MET 05/29/2023 2.  Pt will improve left shoulder flexion to atleast 145 degrees for improved reaching Baseline: 132 Goal status: INITIAL  3.  Pt will improve left shoulder abd to atleast 130 degrees   with 0-min UT compensation Baseline: 70 Goal status: INITIAL  4.  Left UE pain will be decreased by 50% or greater Baseline:  Goal status: INITIAL  5.  Quick dash will be improved to no greater than 30% to demonstrate improved function Baseline:  Goal status: INITIAL   PLAN:  PT FREQUENCY: 2x/week  PT DURATION: 4 weeks  PLANNED INTERVENTIONS: 97164- PT Re-evaluation, 97110-Therapeutic exercises, 97530- Therapeutic activity, 97112- Neuromuscular re-education, 97535- Self Care, 60630- Manual therapy, 97760- Orthotic Fit/training, Patient/Family education, Balance training, Dry Needling, and Joint mobilization  PLAN FOR NEXT SESSION: Review new HEP and assess technique. Cont postural strength and STM prn to pecs/lateral trunk,  DN prn,UT prn, joint mobs, review HEP; try wand flexion and scaption, PROM, decrease UT compensation, progress to strength as ROM improves.  Hermenia Bers, PTA 05/31/2023, 12:04 PM   SHOULDER: Flexion - Supine (Cane)        Cancer Rehab [phone]    Hold cane in both hands. Raise arms up  overhead. Do not allow back to arch. Hold _5__ seconds. Do __5-10__ times; __1-2__ times a day.   SELF ASSISTED WITH OBJECT: Shoulder Abduction / Adduction - Supine    Hold cane with both hands. Move both arms from side to side, keep elbows straight.  Hold when stretch felt for __5__ seconds. Repeat __5-10__ times; __1-2__ times a day. Once this becomes easier progress to third picture bringing affected arm towards ear by staying out to side. Same hold for _5_seconds. Repeat  _5-10_ times, _1-2_ times/day.   SHOULDER: External Rotation - Supine (Cane)    Hold cane with both hands. Rotate arm away from body. Keep elbow on floor and next to body. _5-10__ reps per set, hold 5 seconds, _1-2__ sets per day. Add towel to keep elbow at side.   Flexion (Eccentric) - Active-Assist (Cane)  Use unaffected arm to push affected arm forward. Avoid hiking shoulder (shoulder should NOT touch cheek). Keep palm relaxed. Slowly lower affected arm. Hold stretch for _5_ seconds repeating _5-10_ times, _1-2_ times a day.  Abduction (Eccentric) - Active-Assist (Cane)    Use unaffected arm to push affected arm out to side. Avoid hiking shoulder (shoulder should NOT touch cheek). Keep palm relaxed. Slowly lower affected arm. Hold stretch _5_ seconds repeating _5-10_ times, _1-2_ times a day.

## 2023-06-05 ENCOUNTER — Ambulatory Visit: Payer: Medicare Other

## 2023-06-05 DIAGNOSIS — Z483 Aftercare following surgery for neoplasm: Secondary | ICD-10-CM | POA: Diagnosis not present

## 2023-06-05 DIAGNOSIS — I890 Lymphedema, not elsewhere classified: Secondary | ICD-10-CM

## 2023-06-05 DIAGNOSIS — M25512 Pain in left shoulder: Secondary | ICD-10-CM

## 2023-06-05 DIAGNOSIS — M25612 Stiffness of left shoulder, not elsewhere classified: Secondary | ICD-10-CM

## 2023-06-05 DIAGNOSIS — Z171 Estrogen receptor negative status [ER-]: Secondary | ICD-10-CM

## 2023-06-05 NOTE — Therapy (Signed)
OUTPATIENT PHYSICAL THERAPY  UPPER EXTREMITY ONCOLOGY TREATMENT  Patient Name: Lindsay [name] MRN: [id number] DOB:10-10-1955, 68 y.o., female Today's Date: 06/05/2023  END OF SESSION:  PT End of Session - 06/05/23 1214     Visit Number 7    Number of Visits 8    Date for PT Re-Evaluation 06/07/23    PT Start Time 1206    PT Stop Time 1302    PT Time Calculation (min) 56 min    Activity Tolerance Patient tolerated treatment well    Behavior During Therapy WFL for tasks assessed/performed             Past Medical History:  Diagnosis Date   Breast CA (HCC)    Bronchitis    Family history of bone cancer    Family history of breast cancer    Family history of prostate cancer    Fibromyalgia    HCV (hepatitis C virus)    MRSA (methicillin resistant Staphylococcus aureus) 2012   Shingles    Past Surgical History:  Procedure Laterality Date   MASTECTOMY MODIFIED RADICAL Left 12/26/2019   Procedure: LEFT MASTECTOMY MODIFIED RADICAL;  Surgeon: Griselda Miner, MD;  Location: [facility] OR;  Service: General;  Laterality: Left;  PEC BLOCK   PORTACATH PLACEMENT Right 12/26/2019   Procedure: INSERTION PORT-A-CATH WITH ULTRASOUND GUIDANCE;  Surgeon: Griselda Miner, MD;  Location: [facility] OR;  Service: General;  Laterality: Right;   Patient Active Problem List   Diagnosis Date Noted   Chemotherapy induced neutropenia (HCC) 03/31/2020   Port-A-Cath in place 02/04/2020   Family history of breast cancer    Family history of prostate cancer    Family history of bone cancer    Malignant neoplasm of upper-outer quadrant of left breast in female, estrogen receptor negative (HCC) 10/31/2019      REFERRING PROVIDER: Malachy Mood, MD  REFERRING DIAG: Left arm/shoulder pain after surgery  THERAPY DIAG:  Malignant neoplasm of upper-outer quadrant of left breast in female, estrogen receptor negative (HCC)  Aftercare following surgery for neoplasm  Left shoulder pain, unspecified  chronicity  Stiffness of left shoulder, not elsewhere classified  Secondary lymphedema  ONSET DATE: 9 months  Rationale for Evaluation and Treatment: Rehabilitation  SUBJECTIVE:                                                                                                                                                                                           SUBJECTIVE I was working in my yard Saturday and I paid for it Sunday. I just got really tight. I was raking and driving my tractor. It didn't feel  like I did anything more than I usually do so that was frustrating.    PERTINENT HISTORY: . 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 Patient underwent a left modified radical mastectomy on 12/26/2019 with 13 of 16 were positive for cancer. The Ki67 is 10%. There may be some cancer attaching the LN to the visceral vein but no new diagnositics have been done.   PAIN:  Are you having pain? Yes NPRS scale:7/10 Pain location: left shoulder/underarm Pain orientation: Left  PAIN TYPE: aching and burning, sharp Pain description: constant when using it Aggravating factors: not moving, 1st thing in am,can't lay on left; when overusing Relieving factors: movement, frankincense, tylenol, lidocaine cream  PRECAUTIONS: Left UE lymphedema risk  RED FLAGS: None   WEIGHT BEARING RESTRICTIONS: No  FALLS:  Has patient fallen in last 6 months? Yes. 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  COGNITION: Overall cognitive status: Within functional limits for tasks assessed   PALPATION: Tight in left pectorals  especially axillary border with tenderness, several cords palpable in axillary region, but not easily visible  OBSERVATIONS / OTHER ASSESSMENTS: Wearing lymphdivas 30-40 sleeve, left UT compensation with reaching activities  SENSATION: Light touch: Deficits    POSTURE: forward head, rounded shoulders  UPPER EXTREMITY AROM/PROM:  A/PROM RIGHT   eval   Shoulder extension 60  Shoulder flexion 154  Shoulder abduction 165  Shoulder internal rotation 78  Shoulder external rotation 100    (Blank rows = not tested)  A/PROM LEFT   eval LEFT 05/29/2023  Shoulder extension 48 pain 54  Shoulder flexion 132 Pain 142  Shoulder abduction 70 Pain 127  Shoulder internal rotation    Shoulder external rotation      (Blank rows = not tested)  CERVICAL AROM: All within functional limits:      UPPER EXTREMITY STRENGTH:   LYMPHEDEMA ASSESSMENTS:   SURGERY TYPE/DATE: left modified radical mastectomy on 12/26/2019   NUMBER OF LYMPH NODES REMOVED: 13 of 16 were positive for cancer  CHEMOTHERAPY: YES  RADIATION:YES 08/24/20-10/05/20  HORMONE TREATMENT: NO  INFECTIONS:    LYMPHEDEMA ASSESSMENTS:   LANDMARK RIGHT  eval  At axilla  34.5  15 cm proximal to olecranon process   10 cm proximal to olecranon process 31.9  Olecranon process 25.5  15 cm proximal to ulnar styloid process   10 cm proximal to ulnar styloid process 22.8  Just proximal to ulnar styloid process 16.1  Across hand at thumb web space 19.6  At base of 2nd digit 6.7  (Blank rows = not tested)  LANDMARK LEFT  eval  At axilla    15 cm proximal to olecranon process   10 cm proximal to olecranon process 32.5  Olecranon process 24.4  15 cm proximal to ulnar styloid process   10 cm proximal to ulnar styloid process 21.5  Just proximal to ulnar styloid process 16.1  Across hand at thumb web space 19.3  At base of 2nd digit 6.6  (Blank rows = not tested)   FUNCTIONAL TESTS:    GAIT:WNL   L-DEX LYMPHEDEMA  SCREENING: The patient was assessed using the L-Dex machine today  to produce a lymphedema index baseline score. The patient will be reassessed on a regular basis (typically every 3 months) to obtain new L-Dex scores. If the score is > 6.5 points away from his/her baseline score indicating onset of subclinical lymphedema, it will be recommended to wear a compression garment for 4 weeks, 12 hours per day and then be reassessed. If the score continues to be > 6.5 points from baseline at reassessment, we will initiate lymphedema treatment. Assessing in this manner has a 95% rate of preventing clinically significant lymphedema.     QUICK DASH SURVEY: 66%                                                                                                                            TREATMENT DATE:  06/05/23: Therapeutic Exercises Pulleys into flex and abd x 3 mins each Roll yellow ball up wall into flex x 15, then Lt abd x 10 Manual Therapy P/ROM in supine to Lt shoulder into flex, abd and D2 with scapular depression throughout Scap Mobs in Rt S/L to Lt scapula into protraction and retraction with multiple VC's to relax during to allow for improved mobility STM with cocoa butter to Lt pect tendon and lateral trunk where pt palpably tight but this softens well by end of session  05/31/23: Therapeutic Exercises Pulleys into flex and abd x 2 mins each with VC's to decrease Lt scapular compensation Ball roll up wall into flex and Lt abd x 10 each FreeMotion Machine 7# for scap retract x 12, and then 3# for bil UE ext x 15 each Manual Therapy P/ROM in supine to Lt shoulder into flex, abd and D2 with scapular depression throughout STM with cocoa butter to Lt pect tendon, lateran trunk and medial upper arm   05/29/2023 Pulleys into flex and abd x 2 mins each with VC's to decrease Lt scapular compensation Roll yellow ball up wall into flex and abd x 10 each returning therapist demo Supine wand flex and  scaption x 4 ea Supine bilateral AROM flexion, scaption, horizontal abd x 5, snow angel partial x 5 with asist to keep elbow straight Manual Therapy P/ROM in supine to Lt shoulder into flex, abd and D2 with scapular depression throughout STM with cocoa butter to Lt pect tendon and then into Lt S/L to lateral trunk and periscapular area GH mobs Grade 3/4 post and inferior  05/24/2023 Pulleys into flex and abd x 2 mins each with VC's to decrease Lt scapular compensation Roll yellow ball up wall into flex and abd x 10 each returning therapist demo Standing AA/ROM flex, Lt abd and Lt er with dowel rod x 5-7 reps each, holding 5 sec; then same in supine x 5 reps, 5 sec holds Supine bilateral AROM flexion, scaption, horizontal abd x 5, snow angel partial x 5 with asist to keep elbow straight Manual Therapy P/ROM in supine to Lt shoulder into flex, abd and D2 with scapular depression  throughout STM with cocoa butter to Lt pect tendon and then into Lt S/L to lateral trunk and periscapular area GH mobs Grade 3/4 post and inferior  05/22/23: Therapeutic Exercises Pulleys into flex and abd x 2 mins each with VC's to decrease Lt scapular compensation Roll yellow ball up wall into flex and abd x 10 each returning therapist demo Standing AA/ROM flex, Lt abd and Lt er with dowel rod x 5-7 reps each, holding 5 sec; then same in supine x 10 reps, 5 sec holds and VC's not to bounce at end ROMs. Handout issued Manual Therapy P/ROM in supine to Lt shoulder into flex, abd and D2 with scapular depression throughout STM with cocoa butter to Lt pect tendon and then into Lt S/L to lateral trunk and periscapular area Scap Mobs into protraction and retraction      PATIENT EDUCATION:  Education details: Standing and supine dowel exercises Person educated: Patient Education method: Explanation, Demonstration, and Handouts, verbal and tactile cues Education comprehension: verbalized understanding and returned  demonstration, and will benefit from further review  HOME EXERCISE PROGRAM: 4 post op exercises Dowel exercises in standing and supine  ASSESSMENT:  CLINICAL IMPRESSION: Pt comes in today reporting having increased pain/discomfort over the weekend fro increased yard work and was still a bit more sore today in the Rt axilla and lateral trunk. Spent more time on manual therapy today working to decrease lateral trunk tightness that did seem worsened today from when this therapist saw her last. This does though, soften very well with manual therapy and pt reports this feeling much looser by end of session.   OBJECTIVE IMPAIRMENTS: decreased activity tolerance, decreased knowledge of condition, decreased ROM, decreased strength, hypomobility, increased fascial restrictions, impaired flexibility, impaired UE functional use, postural dysfunction, and pain.   ACTIVITY LIMITATIONS: lifting, reach over head, and hygiene/grooming  PARTICIPATION LIMITATIONS: driving, occupation, and anything requiring overhead reaching  PERSONAL FACTORS: 3+ comorbidities: Triple Negative breast Cancer s/p chemo and radiation  are also affecting patient's functional outcome.   REHAB POTENTIAL: Good  CLINICAL DECISION MAKING: Stable/uncomplicated  EVALUATION COMPLEXITY: Low  GOALS: Goals reviewed with patient? Yes  SHORT TERM GOALS=LONG TERM GOALS: Target date: 06/07/2023  Pt will be independent with a HEP to improve left shoulder ROM and strength Baseline: Goal status: MET 05/29/2023 2.  Pt will improve left shoulder flexion to atleast 145 degrees for improved reaching Baseline: 132 Goal status: INITIAL  3.  Pt will improve left shoulder abd to atleast 130 degrees   with 0-min UT compensation Baseline: 70 Goal status: INITIAL  4.  Left UE pain will be decreased by 50% or greater Baseline:  Goal status: INITIAL  5.  Quick dash will be improved to no greater than 30% to demonstrate improved  function Baseline:  Goal status: INITIAL   PLAN:  PT FREQUENCY: 2x/week  PT DURATION: 4 weeks  PLANNED INTERVENTIONS: 97164- PT Re-evaluation, 97110-Therapeutic exercises, 97530- Therapeutic activity, 97112- Neuromuscular re-education, 97535- Self Care, 30865- Manual therapy, 97760- Orthotic Fit/training, Patient/Family education, Balance training, Dry Needling, and Joint mobilization  PLAN FOR NEXT SESSION: Consider D/C or renewal next. Review new HEP and assess technique. Cont postural strength and STM prn to pecs/lateral trunk,  DN prn,UT prn, joint mobs, review HEP; try wand flexion and scaption, PROM, decrease UT compensation, progress to strength as ROM improves.  Hermenia Bers, PTA 06/05/2023, 1:07 PM   SHOULDER: Flexion - Supine [facility] Mor)        [facility] [phone]  Hold cane in both hands. Raise arms up overhead. Do not allow back to arch. Hold _5__ seconds. Do __5-10__ times; __1-2__ times a day.   SELF ASSISTED WITH OBJECT: Shoulder Abduction / Adduction - Supine    Hold cane with both hands. Move both arms from side to side, keep elbows straight.  Hold when stretch felt for __5__ seconds. Repeat __5-10__ times; __1-2__ times a day. Once this becomes easier progress to third picture bringing affected arm towards ear by staying out to side. Same hold for _5_seconds. Repeat  _5-10_ times, _1-2_ times/day.   SHOULDER: External Rotation - Supine (Cane)    Hold cane with both hands. Rotate arm away from body. Keep elbow on floor and next to body. _5-10__ reps per set, hold 5 seconds, _1-2__ sets per day. Add towel to keep elbow at side.   Flexion (Eccentric) - Active-Assist (Cane)              Use unaffected arm to push affected arm forward. Avoid hiking shoulder (shoulder should NOT touch cheek). Keep palm relaxed. Slowly lower affected arm. Hold stretch for _5_ seconds repeating _5-10_ times, _1-2_ times a day.  Abduction (Eccentric) - Active-Assist  (Cane)    Use unaffected arm to push affected arm out to side. Avoid hiking shoulder (shoulder should NOT touch cheek). Keep palm relaxed. Slowly lower affected arm. Hold stretch _5_ seconds repeating _5-10_ times, _1-2_ times a day.

## 2023-06-07 ENCOUNTER — Ambulatory Visit: Payer: Medicare Other

## 2023-06-07 DIAGNOSIS — Z483 Aftercare following surgery for neoplasm: Secondary | ICD-10-CM

## 2023-06-07 DIAGNOSIS — Z171 Estrogen receptor negative status [ER-]: Secondary | ICD-10-CM

## 2023-06-07 DIAGNOSIS — M25512 Pain in left shoulder: Secondary | ICD-10-CM

## 2023-06-07 DIAGNOSIS — I890 Lymphedema, not elsewhere classified: Secondary | ICD-10-CM

## 2023-06-07 DIAGNOSIS — M25612 Stiffness of left shoulder, not elsewhere classified: Secondary | ICD-10-CM

## 2023-06-07 NOTE — Therapy (Signed)
OUTPATIENT PHYSICAL THERAPY  UPPER EXTREMITY ONCOLOGY TREATMENT  Patient Name: Lindsay [name] MRN: [id number] DOB:[birth year], 68 y.o., female Today's Date: 06/07/2023  END OF SESSION:  PT End of Session - 06/07/23 1456     Visit Number 8    Number of Visits 16    Date for PT Re-Evaluation 07/05/23    PT Start Time 1500    PT Stop Time 1552    PT Time Calculation (min) 52 min    Activity Tolerance Patient tolerated treatment well    Behavior During Therapy WFL for tasks assessed/performed             Past Medical History:  Diagnosis Date   Breast CA (HCC)    Bronchitis    Family history of bone cancer    Family history of breast cancer    Family history of prostate cancer    Fibromyalgia    HCV (hepatitis C virus)    MRSA (methicillin resistant Staphylococcus aureus) 2012   Shingles    Past Surgical History:  Procedure Laterality Date   MASTECTOMY MODIFIED RADICAL Left 12/26/2019   Procedure: LEFT MASTECTOMY MODIFIED RADICAL;  Surgeon: Griselda Miner, MD;  Location: [facility] OR;  Service: General;  Laterality: Left;  PEC BLOCK   PORTACATH PLACEMENT Right 12/26/2019   Procedure: INSERTION PORT-A-CATH WITH ULTRASOUND GUIDANCE;  Surgeon: Griselda Miner, MD;  Location: [facility] OR;  Service: General;  Laterality: Right;   Patient Active Problem List   Diagnosis Date Noted   Chemotherapy induced neutropenia (HCC) 03/31/2020   Port-A-Cath in place 02/04/2020   Family history of breast cancer    Family history of prostate cancer    Family history of bone cancer    Malignant neoplasm of upper-outer quadrant of left breast in female, estrogen receptor negative (HCC) 10/31/2019      REFERRING PROVIDER: Malachy Mood, MD  REFERRING DIAG: Left arm/shoulder pain after surgery  THERAPY DIAG:  Malignant neoplasm of upper-outer quadrant of left breast in female, estrogen receptor negative (HCC)  Aftercare following surgery for neoplasm  Left shoulder pain, unspecified  chronicity  Stiffness of left shoulder, not elsewhere classified  Secondary lymphedema  ONSET DATE: 9 months  Rationale for Evaluation and Treatment: Rehabilitation  SUBJECTIVE:                                                                                                                                                                                           SUBJECTIVE I think my ROM is  better. I drove my tractor this week and I worked in the yard. I still have issues because the tractor has no  power steering.  I had to rake and use my blower. When I use it I know its not back to normal. It still gets tight and painful.I can pet my dog longer. I can sweep better, I usually feel better after I am here.  Frequency and intensity of pain is better by about 20-25%.  PERTINENT HISTORY: . 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 Patient underwent a left modified radical mastectomy on 12/26/2019 with 13 of 16 were positive for cancer. The Ki67 is 10%. There may be some cancer attaching the LN to the visceral vein but no new diagnositics have been done.   PAIN:  Are you having pain? Yes NPRS scale:0 at best-7-8/10 at worst. Pain location: left shoulder/underarm Pain orientation: Left  PAIN TYPE: aching and burning, sharp Pain description: constant when using it Aggravating factors: not moving, 1st thing in am,can't lay on left; when overusing Relieving factors: movement, frankincense, tylenol, lidocaine cream  PRECAUTIONS: Left UE lymphedema risk  RED FLAGS: None   WEIGHT BEARING RESTRICTIONS: No  FALLS:  Has patient fallen in last 6 months? Yes. 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  COGNITION: Overall cognitive status: Within functional limits for tasks assessed   PALPATION: Tight in left pectorals especially axillary border with tenderness, several cords palpable in axillary region, but not easily visible  OBSERVATIONS / OTHER ASSESSMENTS: Wearing lymphdivas 30-40 sleeve, left UT compensation with reaching activities  SENSATION: Light touch: Deficits    POSTURE: forward head, rounded shoulders  UPPER EXTREMITY AROM/PROM:  A/PROM RIGHT   eval   Shoulder extension 60  Shoulder flexion 154  Shoulder abduction 165  Shoulder internal rotation 78  Shoulder external rotation 100    (Blank rows = not tested)  A/PROM LEFT   eval LEFT 05/29/2023 LEFT 06/07/2023  Shoulder extension 48 pain 54 50  Shoulder flexion 132 Pain 142 151  Shoulder abduction 70 Pain 127 145  Shoulder internal rotation     Shoulder external rotation       (Blank rows = not tested)  CERVICAL AROM: All within functional limits:      UPPER EXTREMITY STRENGTH:   LYMPHEDEMA ASSESSMENTS:   SURGERY TYPE/DATE: left modified radical mastectomy on 12/26/2019   NUMBER OF LYMPH NODES REMOVED: 13 of 16 were positive for cancer  CHEMOTHERAPY: YES  RADIATION:YES 08/24/20-10/05/20  HORMONE TREATMENT: NO  INFECTIONS:    LYMPHEDEMA ASSESSMENTS:   LANDMARK RIGHT  eval  At axilla  34.5  15 cm proximal to olecranon process   10 cm proximal to olecranon process 31.9  Olecranon process 25.5  15 cm proximal to ulnar styloid process   10 cm proximal to ulnar styloid process 22.8  Just proximal to ulnar styloid process 16.1  Across hand at thumb web space 19.6  At base of 2nd digit 6.7  (Blank rows = not tested)  LANDMARK LEFT  eval  At axilla    15 cm proximal to olecranon process   10 cm proximal to olecranon process 32.5  Olecranon process 24.4  15 cm proximal to ulnar styloid  process   10 cm proximal to ulnar styloid process  21.5  Just proximal to ulnar styloid process 16.1  Across hand at thumb web space 19.3  At base of 2nd digit 6.6  (Blank rows = not tested)   FUNCTIONAL TESTS:    GAIT:WNL   L-DEX LYMPHEDEMA SCREENING: The patient was assessed using the L-Dex machine today to produce a lymphedema index baseline score. The patient will be reassessed on a regular basis (typically every 3 months) to obtain new L-Dex scores. If the score is > 6.5 points away from his/her baseline score indicating onset of subclinical lymphedema, it will be recommended to wear a compression garment for 4 weeks, 12 hours per day and then be reassessed. If the score continues to be > 6.5 points from baseline at reassessment, we will initiate lymphedema treatment. Assessing in this manner has a 95% rate of preventing clinically significant lymphedema.     QUICK DASH SURVEY: 66% eval,  06/07/2023 59%                                                                                                                            TREATMENT DATE:   06/07/2023 Pulleys x 2 min flex and abduction Lat stretch on laundry cart x 3 Wall arc x 3 STM with cocoa butter to Lt pect tendon, UT, lateral trunk and then into Lt S/L to periscapular area  PROM left shoulder flexion, scaption, abduction, ER to restore functional ROM Measured AROM and reviewed goals for PN 06/05/23: Therapeutic Exercises Pulleys into flex and abd x 3 mins each Roll yellow ball up wall into flex x 15, then Lt abd x 10 Manual Therapy P/ROM in supine to Lt shoulder into flex, abd and D2 with scapular depression throughout Scap Mobs in Rt S/L to Lt scapula into protraction and retraction with multiple VC's to relax during to allow for improved mobility STM with cocoa butter to Lt pect tendon and lateral trunk where pt palpably tight but this softens well by end of session  05/31/23: Therapeutic Exercises Pulleys into  flex and abd x 2 mins each with VC's to decrease Lt scapular compensation Ball roll up wall into flex and Lt abd x 10 each FreeMotion Machine 7# for scap retract x 12, and then 3# for bil UE ext x 15 each Manual Therapy P/ROM in supine to Lt shoulder into flex, abd and D2 with scapular depression throughout STM with cocoa butter to Lt pect tendon, lateran trunk and medial upper arm   05/29/2023 Pulleys into flex and abd x 2 mins each with VC's to decrease Lt scapular compensation Roll yellow ball up wall into flex and abd x 10 each returning therapist demo Supine wand flex and scaption x 4 ea Supine bilateral AROM flexion, scaption, horizontal abd x 5, snow angel partial x 5 with asist to keep elbow straight Manual Therapy P/ROM in supine to Lt shoulder into flex, abd and D2 with scapular depression throughout STM with cocoa butter to Lt pect tendon and  then into Lt S/L to lateral trunk and periscapular area GH mobs Grade 3/4 post and inferior  05/24/2023 Pulleys into flex and abd x 2 mins each with VC's to decrease Lt scapular compensation Roll yellow ball up wall into flex and abd x 10 each returning therapist demo Standing AA/ROM flex, Lt abd and Lt er with dowel rod x 5-7 reps each, holding 5 sec; then same in supine x 5 reps, 5 sec holds Supine bilateral AROM flexion, scaption, horizontal abd x 5, snow angel partial x 5 with asist to keep elbow straight Manual Therapy P/ROM in supine to Lt shoulder into flex, abd and D2 with scapular depression throughout STM with cocoa butter to Lt pect tendon and then into Lt S/L to lateral trunk and periscapular area GH mobs Grade 3/4 post and inferior  05/22/23: Therapeutic Exercises Pulleys into flex and abd x 2 mins each with VC's to decrease Lt scapular compensation Roll yellow ball up wall into flex and abd x 10 each returning therapist demo Standing AA/ROM flex, Lt abd and Lt er with dowel rod x 5-7 reps each, holding 5 sec; then same in  supine x 10 reps, 5 sec holds and VC's not to bounce at end ROMs. Handout issued Manual Therapy P/ROM in supine to Lt shoulder into flex, abd and D2 with scapular depression throughout STM with cocoa butter to Lt pect tendon and then into Lt S/L to lateral trunk and periscapular area Scap Mobs into protraction and retraction      PATIENT EDUCATION:  Education details: Standing and supine dowel exercises Person educated: Patient Education method: Explanation, Demonstration, and Handouts, verbal and tactile cues Education comprehension: verbalized understanding and returned demonstration, and will benefit from further review  HOME EXERCISE PROGRAM: 4 post op exercises Dowel exercises in standing and supine  ASSESSMENT:  CLINICAL IMPRESSION: Pt has achieved Goals 1,2, and 3 and is progressing with remaining. She is independent and compliant with her HEP. She has made good improvements with shoulder ROM, improving AROM for flexion by 19 degrees and abduction by 75 degrees. Overall pain is improved by 25% but she has not yet achieved her pain goal of 50% improvement. Quick dash has improved by 9% overall. She is still functionally limited with her home and yard activities and will benefit from continued skilled PT to address deficits and return to PLOF.  OBJECTIVE IMPAIRMENTS: decreased activity tolerance, decreased knowledge of condition, decreased ROM, decreased strength, hypomobility, increased fascial restrictions, impaired flexibility, impaired UE functional use, postural dysfunction, and pain.   ACTIVITY LIMITATIONS: lifting, reach over head, and hygiene/grooming  PARTICIPATION LIMITATIONS: driving, occupation, and anything requiring overhead reaching  PERSONAL FACTORS: 3+ comorbidities: Triple Negative breast Cancer s/p chemo and radiation  are also affecting patient's functional outcome.   REHAB POTENTIAL: Good  CLINICAL DECISION MAKING: Stable/uncomplicated  EVALUATION  COMPLEXITY: Low  GOALS: Goals reviewed with patient? Yes  SHORT TERM GOALS=LONG TERM GOALS: Target date: 06/07/2023  Pt will be independent with a HEP to improve left shoulder ROM and strength Baseline: Goal status: MET 05/29/2023 2.  Pt will improve left shoulder flexion to atleast 145 degrees for improved reaching Baseline: 132 Goal status: MET 06/07/2023  3.  Pt will improve left shoulder abd to atleast 130 degrees   with 0-min UT compensation Baseline: 70 Goal status: MET 06/07/2023  4.  Left UE pain will be decreased by 50% or greater Baseline:  Goal status: In Progress  5.  Quick dash will be improved to  no greater than 30% to demonstrate improved function Baseline:  Goal status: In Progress, 59%  6. Able to pull down seat belt with left hand and put coat on with minimal pain  Goal Status;New  PLAN:  PT FREQUENCY: 2x/week  PT DURATION: 4 weeks  PLANNED INTERVENTIONS: 97164- PT Re-evaluation, 97110-Therapeutic exercises, 97530- Therapeutic activity, 97112- Neuromuscular re-education, 97535- Self Care, 16109- Manual therapy, 97760- Orthotic Fit/training, Patient/Family education, Balance training, Dry Needling, and Joint mobilization  PLAN FOR NEXT SESSION:  Review new HEP and assess technique. Cont postural strength and STM prn to pecs/lateral trunk,  DN prn,UT prn, joint mobs, review HEP; try wand flexion and scaption, PROM, decrease UT compensation, progress to strength as ROM improves.  Waynette Buttery, PT 06/07/2023, 4:01 PM   SHOULDER: Flexion - Supine (Cane)        [facility] [phone]    Hold cane in both hands. Raise arms up overhead. Do not allow back to arch. Hold _5__ seconds. Do __5-10__ times; __1-2__ times a day.   SELF ASSISTED WITH OBJECT: Shoulder Abduction / Adduction - Supine    Hold cane with both hands. Move both arms from side to side, keep elbows straight.  Hold when stretch felt for __5__ seconds. Repeat __5-10__ times; __1-2__ times a  day. Once this becomes easier progress to third picture bringing affected arm towards ear by staying out to side. Same hold for _5_seconds. Repeat  _5-10_ times, _1-2_ times/day.   SHOULDER: External Rotation - Supine (Cane)    Hold cane with both hands. Rotate arm away from body. Keep elbow on floor and next to body. _5-10__ reps per set, hold 5 seconds, _1-2__ sets per day. Add towel to keep elbow at side.   Flexion (Eccentric) - Active-Assist (Cane)              Use unaffected arm to push affected arm forward. Avoid hiking shoulder (shoulder should NOT touch cheek). Keep palm relaxed. Slowly lower affected arm. Hold stretch for _5_ seconds repeating _5-10_ times, _1-2_ times a day.  Abduction (Eccentric) - Active-Assist (Cane)    Use unaffected arm to push affected arm out to side. Avoid hiking shoulder (shoulder should NOT touch cheek). Keep palm relaxed. Slowly lower affected arm. Hold stretch _5_ seconds repeating _5-10_ times, _1-2_ times a day.

## 2023-06-12 ENCOUNTER — Ambulatory Visit: Payer: Medicare Other

## 2023-06-14 ENCOUNTER — Ambulatory Visit: Payer: Medicare Other | Attending: Hematology

## 2023-06-14 DIAGNOSIS — M25512 Pain in left shoulder: Secondary | ICD-10-CM | POA: Diagnosis present

## 2023-06-14 DIAGNOSIS — C50412 Malignant neoplasm of upper-outer quadrant of left female breast: Secondary | ICD-10-CM | POA: Insufficient documentation

## 2023-06-14 DIAGNOSIS — Z483 Aftercare following surgery for neoplasm: Secondary | ICD-10-CM | POA: Insufficient documentation

## 2023-06-14 DIAGNOSIS — I890 Lymphedema, not elsewhere classified: Secondary | ICD-10-CM | POA: Diagnosis present

## 2023-06-14 DIAGNOSIS — M25612 Stiffness of left shoulder, not elsewhere classified: Secondary | ICD-10-CM | POA: Diagnosis present

## 2023-06-14 DIAGNOSIS — Z171 Estrogen receptor negative status [ER-]: Secondary | ICD-10-CM | POA: Insufficient documentation

## 2023-06-14 NOTE — Therapy (Signed)
 OUTPATIENT PHYSICAL THERAPY  UPPER EXTREMITY ONCOLOGY TREATMENT  Patient Name: Lindsay [name] MRN: [id number] DOB:June 14, 1955, 68 y.o., female Today's Date: 06/14/2023  END OF SESSION:  PT End of Session - 06/14/23 1009     Visit Number 9    Number of Visits 16    Date for PT Re-Evaluation 07/05/23    PT Start Time 1005    PT Stop Time 1100    PT Time Calculation (min) 55 min    Activity Tolerance Patient tolerated treatment well    Behavior During Therapy WFL for tasks assessed/performed             Past Medical History:  Diagnosis Date   Breast CA (HCC)    Bronchitis    Family history of bone cancer    Family history of breast cancer    Family history of prostate cancer    Fibromyalgia    HCV (hepatitis C virus)    MRSA (methicillin resistant Staphylococcus aureus) 2012   Shingles    Past Surgical History:  Procedure Laterality Date   MASTECTOMY MODIFIED RADICAL Left 12/26/2019   Procedure: LEFT MASTECTOMY MODIFIED RADICAL;  Surgeon: Curvin Deward MOULD, MD;  Location: [facility] OR;  Service: General;  Laterality: Left;  PEC BLOCK   PORTACATH PLACEMENT Right 12/26/2019   Procedure: INSERTION PORT-A-CATH WITH ULTRASOUND GUIDANCE;  Surgeon: Curvin Deward MOULD, MD;  Location: [facility] OR;  Service: General;  Laterality: Right;   Patient Active Problem List   Diagnosis Date Noted   Chemotherapy induced neutropenia (HCC) 03/31/2020   Port-A-Cath in place 02/04/2020   Family history of breast cancer    Family history of prostate cancer    Family history of bone cancer    Malignant neoplasm of upper-outer quadrant of left breast in female, estrogen receptor negative (HCC) 10/31/2019      REFERRING PROVIDER: Onita Mattock, MD  REFERRING DIAG: Left arm/shoulder pain after surgery  THERAPY DIAG:  Malignant neoplasm of upper-outer quadrant of left breast in female, estrogen receptor negative (HCC)  Aftercare following surgery for neoplasm  Left shoulder pain, unspecified  chronicity  Stiffness of left shoulder, not elsewhere classified  Secondary lymphedema  ONSET DATE: 9 months  Rationale for Evaluation and Treatment: Rehabilitation  SUBJECTIVE:                                                                                                                                                                                           SUBJECTIVE I've been using a heating pad for a few mins before I get in the shower and then I use the hot water to better help relax the  muscles and I found I can stretch better after that. Overall I can tell that it's better since starting therapy.   PERTINENT HISTORY: . 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 Patient underwent a left modified radical mastectomy on 12/26/2019 with 13 of 16 were positive for cancer. The Ki67 is 10%. There may be some cancer attaching the LN to the visceral vein but no new diagnositics have been done.   PAIN:  Are you having pain? Yes NPRS scale:0 at best-7-8/10 at worst. Pain location: left shoulder/underarm Pain orientation: Left  PAIN TYPE: aching and burning, sharp Pain description: constant when using it Aggravating factors: not moving, 1st thing in am,can't lay on left; when overusing Relieving factors: movement, frankincense, tylenol , lidocaine  cream  PRECAUTIONS: Left UE lymphedema risk  RED FLAGS: None   WEIGHT BEARING RESTRICTIONS: No  FALLS:  Has patient fallen in last 6 months? Yes. 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  COGNITION: Overall cognitive status: Within functional limits for  tasks assessed   PALPATION: Tight in left pectorals especially axillary border with tenderness, several cords palpable in axillary region, but not easily visible  OBSERVATIONS / OTHER ASSESSMENTS: Wearing lymphdivas 30-40 sleeve, left UT compensation with reaching activities  SENSATION: Light touch: Deficits    POSTURE: forward head, rounded shoulders  UPPER EXTREMITY AROM/PROM:  A/PROM RIGHT   eval   Shoulder extension 60  Shoulder flexion 154  Shoulder abduction 165  Shoulder internal rotation 78  Shoulder external rotation 100    (Blank rows = not tested)  A/PROM LEFT   eval LEFT 05/29/2023 LEFT 06/07/2023  Shoulder extension 48 pain 54 50  Shoulder flexion 132 Pain 142 151  Shoulder abduction 70 Pain 127 145  Shoulder internal rotation     Shoulder external rotation       (Blank rows = not tested)  CERVICAL AROM: All within functional limits:      UPPER EXTREMITY STRENGTH:   LYMPHEDEMA ASSESSMENTS:   SURGERY TYPE/DATE: left modified radical mastectomy on 12/26/2019   NUMBER OF LYMPH NODES REMOVED: 13 of 16 were positive for cancer  CHEMOTHERAPY: YES  RADIATION:YES 08/24/20-10/05/20  HORMONE TREATMENT: NO  INFECTIONS:    LYMPHEDEMA ASSESSMENTS:   LANDMARK RIGHT  eval  At axilla  34.5  15 cm proximal to olecranon process   10 cm proximal to olecranon process 31.9  Olecranon process 25.5  15 cm proximal to ulnar styloid process   10 cm proximal to ulnar styloid process 22.8  Just proximal to ulnar styloid process 16.1  Across hand at thumb web space 19.6  At base of 2nd digit 6.7  (Blank rows = not tested)  LANDMARK LEFT  eval  At axilla    15 cm proximal to olecranon process   10 cm proximal to olecranon process 32.5  Olecranon process 24.4  15 cm proximal to ulnar styloid process   10 cm proximal to ulnar styloid process 21.5  Just proximal to ulnar styloid process 16.1  Across hand at thumb web space 19.3  At base of 2nd digit 6.6   (Blank rows = not tested)   FUNCTIONAL TESTS:  GAIT:WNL   L-DEX LYMPHEDEMA SCREENING: The patient was assessed using the L-Dex machine today to produce a lymphedema index baseline score. The patient will be reassessed on a regular basis (typically every 3 months) to obtain new L-Dex scores. If the score is > 6.5 points away from his/her baseline score indicating onset of subclinical lymphedema, it will be recommended to wear a compression garment for 4 weeks, 12 hours per day and then be reassessed. If the score continues to be > 6.5 points from baseline at reassessment, we will initiate lymphedema treatment. Assessing in this manner has a 95% rate of preventing clinically significant lymphedema.     QUICK DASH SURVEY: 66% eval,  06/07/2023 59%                                                                                                                            TREATMENT DATE:  06/14/23: Therapeutic Exercises Pulleys x 2 min flex and abduction, VC's to remind pt of correct technique Wall arc x 3 Roll yellow ball up wall into flex and Lt UE abd x 10 each Modified downward dog on wall x 5, 5 sec holds returning therapist demo  Lt UE OH reach in doorway for Lt lat stretch x 3 returning demo Manual Therapy STM with cocoa butter to Lt pect tendon, UT, lateral trunk  PROM left shoulder flexion, scaption, abduction, and D2 to restore functional ROM and scapular depression throughout stretching   06/07/2023 Pulleys x 2 min flex and abduction Lat stretch on laundry cart x 3 Wall arc x 3 STM with cocoa butter to Lt pect tendon, UT, lateral trunk and then into Lt S/L to periscapular area  PROM left shoulder flexion, scaption, abduction, ER to restore functional ROM Measured AROM and reviewed goals for PN 06/05/23: Therapeutic Exercises Pulleys into flex and abd x 3 mins each Roll yellow ball up wall into flex x 15, then Lt abd x 10 Manual Therapy P/ROM in supine to Lt shoulder into  flex, abd and D2 with scapular depression throughout Scap Mobs in Rt S/L to Lt scapula into protraction and retraction with multiple VC's to relax during to allow for improved mobility STM with cocoa butter to Lt pect tendon and lateral trunk where pt palpably tight but this softens well by end of session  05/31/23: Therapeutic Exercises Pulleys into flex and abd x 2 mins each with VC's to decrease Lt scapular compensation Ball roll up wall into flex and Lt abd x 10 each FreeMotion Machine 7# for scap retract x 12, and then 3# for bil UE ext x 15 each Manual Therapy P/ROM in supine to Lt shoulder into flex, abd and D2 with scapular depression throughout STM with cocoa butter to Lt pect tendon, lateran trunk and medial upper arm   05/29/2023 Pulleys into flex and abd x 2 mins each with VC's to decrease Lt scapular compensation Roll yellow ball up wall into flex and abd x 10  each returning therapist demo Supine wand flex and scaption x 4 ea Supine bilateral AROM flexion, scaption, horizontal abd x 5, snow angel partial x 5 with asist to keep elbow straight Manual Therapy P/ROM in supine to Lt shoulder into flex, abd and D2 with scapular depression throughout STM with cocoa butter to Lt pect tendon and then into Lt S/L to lateral trunk and periscapular area GH mobs Grade 3/4 post and inferior  05/24/2023 Pulleys into flex and abd x 2 mins each with VC's to decrease Lt scapular compensation Roll yellow ball up wall into flex and abd x 10 each returning therapist demo Standing AA/ROM flex, Lt abd and Lt er with dowel rod x 5-7 reps each, holding 5 sec; then same in supine x 5 reps, 5 sec holds Supine bilateral AROM flexion, scaption, horizontal abd x 5, snow angel partial x 5 with asist to keep elbow straight Manual Therapy P/ROM in supine to Lt shoulder into flex, abd and D2 with scapular depression throughout STM with cocoa butter to Lt pect tendon and then into Lt S/L to lateral trunk and  periscapular area GH mobs Grade 3/4 post and inferior  05/22/23: Therapeutic Exercises Pulleys into flex and abd x 2 mins each with VC's to decrease Lt scapular compensation Roll yellow ball up wall into flex and abd x 10 each returning therapist demo Standing AA/ROM flex, Lt abd and Lt er with dowel rod x 5-7 reps each, holding 5 sec; then same in supine x 10 reps, 5 sec holds and VC's not to bounce at end ROMs. Handout issued Manual Therapy P/ROM in supine to Lt shoulder into flex, abd and D2 with scapular depression throughout STM with cocoa butter to Lt pect tendon and then into Lt S/L to lateral trunk and periscapular area Scap Mobs into protraction and retraction      PATIENT EDUCATION:  Education details: Standing and supine dowel exercises Person educated: Patient Education method: Explanation, Demonstration, and Handouts, verbal and tactile cues Education comprehension: verbalized understanding and returned demonstration, and will benefit from further review  HOME EXERCISE PROGRAM: 4 post op exercises Dowel exercises in standing and supine  ASSESSMENT:  CLINICAL IMPRESSION: Continued with progressing A/ROM of Lt shoulder with new stretches that pt can incorporate into her day. Then also continued with manual therapy working to decrease Lt upper quadrant tightness to decrease fascial restrictions.   OBJECTIVE IMPAIRMENTS: decreased activity tolerance, decreased knowledge of condition, decreased ROM, decreased strength, hypomobility, increased fascial restrictions, impaired flexibility, impaired UE functional use, postural dysfunction, and pain.   ACTIVITY LIMITATIONS: lifting, reach over head, and hygiene/grooming  PARTICIPATION LIMITATIONS: driving, occupation, and anything requiring overhead reaching  PERSONAL FACTORS: 3+ comorbidities: Triple Negative breast Cancer s/p chemo and radiation  are also affecting patient's functional outcome.   REHAB POTENTIAL:  Good  CLINICAL DECISION MAKING: Stable/uncomplicated  EVALUATION COMPLEXITY: Low  GOALS: Goals reviewed with patient? Yes  SHORT TERM GOALS=LONG TERM GOALS: Target date: 06/07/2023  Pt will be independent with a HEP to improve left shoulder ROM and strength Baseline: Goal status: MET 05/29/2023 2.  Pt will improve left shoulder flexion to atleast 145 degrees for improved reaching Baseline: 132 Goal status: MET 06/07/2023  3.  Pt will improve left shoulder abd to atleast 130 degrees   with 0-min UT compensation Baseline: 70 Goal status: MET 06/07/2023  4.  Left UE pain will be decreased by 50% or greater Baseline:  Goal status: In Progress  5.  Quick dash will  be improved to no greater than 30% to demonstrate improved function Baseline:  Goal status: In Progress, 59%  6. Able to pull down seat belt with left hand and put coat on with minimal pain  Goal Status;New  PLAN:  PT FREQUENCY: 2x/week  PT DURATION: 4 weeks  PLANNED INTERVENTIONS: 97164- PT Re-evaluation, 97110-Therapeutic exercises, 97530- Therapeutic activity, 97112- Neuromuscular re-education, 97535- Self Care, [id number]- Manual therapy, 97760- Orthotic Fit/training, Patient/Family education, Balance training, Dry Needling, and Joint mobilization  PLAN FOR NEXT SESSION:  Review new HEP and assess technique. Cont postural strength and STM prn to pecs/lateral trunk,  DN prn,UT prn, joint mobs, review HEP; try wand flexion and scaption, PROM, decrease UT compensation, progress to strength as ROM improves.  Aden Berwyn Caldron, PTA 06/14/2023, 11:01 AM   SHOULDER: Flexion - Supine (Cane)        [facility] [phone]    Hold cane in both hands. Raise arms up overhead. Do not allow back to arch. Hold _5__ seconds. Do __5-10__ times; __1-2__ times a day.   SELF ASSISTED WITH OBJECT: Shoulder Abduction / Adduction - Supine    Hold cane with both hands. Move both arms from side to side, keep elbows straight.  Hold  when stretch felt for __5__ seconds. Repeat __5-10__ times; __1-2__ times a day. Once this becomes easier progress to third picture bringing affected arm towards ear by staying out to side. Same hold for _5_seconds. Repeat  _5-10_ times, _1-2_ times/day.   SHOULDER: External Rotation - Supine (Cane)    Hold cane with both hands. Rotate arm away from body. Keep elbow on floor and next to body. _5-10__ reps per set, hold 5 seconds, _1-2__ sets per day. Add towel to keep elbow at side.   Flexion (Eccentric) - Active-Assist (Cane)              Use unaffected arm to push affected arm forward. Avoid hiking shoulder (shoulder should NOT touch cheek). Keep palm relaxed. Slowly lower affected arm. Hold stretch for _5_ seconds repeating _5-10_ times, _1-2_ times a day.  Abduction (Eccentric) - Active-Assist (Cane)    Use unaffected arm to push affected arm out to side. Avoid hiking shoulder (shoulder should NOT touch cheek). Keep palm relaxed. Slowly lower affected arm. Hold stretch _5_ seconds repeating _5-10_ times, _1-2_ times a day.

## 2023-06-19 ENCOUNTER — Ambulatory Visit: Payer: Medicare Other

## 2023-06-21 LAB — SIGNATERA
SIGNATERA MTM READOUT: 0 MTM/ml
SIGNATERA TEST RESULT: NEGATIVE

## 2023-06-22 ENCOUNTER — Telehealth: Payer: Self-pay

## 2023-06-22 NOTE — Telephone Encounter (Signed)
Open by accident

## 2023-06-22 NOTE — Telephone Encounter (Signed)
Attempted to contact patient. Unable to reach patient.  Left vmail stating, per Dr. Mosetta Putt her Signatera result was negative. & that per Vincent Gros, her CMP result was stable.

## 2023-06-26 ENCOUNTER — Ambulatory Visit: Payer: Medicare Other

## 2023-06-26 DIAGNOSIS — C50412 Malignant neoplasm of upper-outer quadrant of left female breast: Secondary | ICD-10-CM

## 2023-06-26 DIAGNOSIS — M25612 Stiffness of left shoulder, not elsewhere classified: Secondary | ICD-10-CM

## 2023-06-26 DIAGNOSIS — Z483 Aftercare following surgery for neoplasm: Secondary | ICD-10-CM

## 2023-06-26 DIAGNOSIS — I890 Lymphedema, not elsewhere classified: Secondary | ICD-10-CM

## 2023-06-26 DIAGNOSIS — M25512 Pain in left shoulder: Secondary | ICD-10-CM

## 2023-06-26 NOTE — Therapy (Addendum)
 OUTPATIENT PHYSICAL THERAPY  UPPER EXTREMITY ONCOLOGY TREATMENT  Progress Note Reporting Period 05/10/2023 to 06/26/2023  See note below for Objective Data and Assessment of Progress/Goals.      Patient Name: Lindsay [name] MRN: [id number] DOB:[birth year], 68 y.o., female Today's Date: 06/26/2023  END OF SESSION:  PT End of Session - 06/26/23 1212     Visit Number 10    Number of Visits 16    Date for PT Re-Evaluation 07/05/23    PT Start Time 1206    PT Stop Time 1305    PT Time Calculation (min) 59 min    Activity Tolerance Patient tolerated treatment well    Behavior During Therapy WFL for tasks assessed/performed             Past Medical History:  Diagnosis Date   Breast CA (HCC)    Bronchitis    Family history of bone cancer    Family history of breast cancer    Family history of prostate cancer    Fibromyalgia    HCV (hepatitis C virus)    MRSA (methicillin resistant Staphylococcus aureus) 2012   Shingles    Past Surgical History:  Procedure Laterality Date   MASTECTOMY MODIFIED RADICAL Left 12/26/2019   Procedure: LEFT MASTECTOMY MODIFIED RADICAL;  Surgeon: Griselda Miner, MD;  Location: [facility] OR;  Service: General;  Laterality: Left;  PEC BLOCK   PORTACATH PLACEMENT Right 12/26/2019   Procedure: INSERTION PORT-A-CATH WITH ULTRASOUND GUIDANCE;  Surgeon: Griselda Miner, MD;  Location: [facility] OR;  Service: General;  Laterality: Right;   Patient Active Problem List   Diagnosis Date Noted   Chemotherapy induced neutropenia (HCC) 03/31/2020   Port-A-Cath in place 02/04/2020   Family history of breast cancer    Family history of prostate cancer    Family history of bone cancer    Malignant neoplasm of upper-outer quadrant of left breast in female, estrogen receptor negative (HCC) 10/31/2019      REFERRING PROVIDER: Malachy Mood, MD  REFERRING DIAG: Left arm/shoulder pain after surgery  THERAPY DIAG:  Malignant neoplasm of upper-outer quadrant of left  breast in female, estrogen receptor negative (HCC)  Aftercare following surgery for neoplasm  Left shoulder pain, unspecified chronicity  Stiffness of left shoulder, not elsewhere classified  Secondary lymphedema  ONSET DATE: 9 months  Rationale for Evaluation and Treatment: Rehabilitation  SUBJECTIVE:                                                                                                                                                                                           SUBJECTIVE  I had to miss my last appt because I was exhausted. I had helped clean out my friends house that passed away. I kept doing my exercises, I can't move if I don't! I would say my first few weeks of physical therapy I couldn't tell much of an improvement but these last 2 weeks I feel like we are really starting to make some progress.   PERTINENT HISTORY: . 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 Patient underwent a left modified radical mastectomy on 12/26/2019 with 13 of 16 were positive for cancer. The Ki67 is 10%. There may be some cancer attaching the LN to the visceral vein but no new diagnositics have been done.   PAIN:  Are you having pain? Yes NPRS scale:7/10 at worst. Pain location: left underarm Pain orientation: Left  PAIN TYPE: aching and burning, sharp Pain description: constant when using it Aggravating factors: not moving, 1st thing in am,can't lay on left; when overusing Relieving factors: movement, frankincense, tylenol, lidocaine cream  PRECAUTIONS: Left UE lymphedema risk  RED FLAGS: None   WEIGHT BEARING RESTRICTIONS: No  FALLS:  Has patient fallen in last 6 months? Yes. 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  COGNITION: Overall cognitive status: Within functional limits for tasks assessed   PALPATION: Tight in left pectorals especially axillary border with tenderness, several cords palpable in axillary region, but not easily visible  OBSERVATIONS / OTHER ASSESSMENTS: Wearing lymphdivas 30-40 sleeve, left UT compensation with reaching activities  SENSATION: Light touch: Deficits    POSTURE: forward head, rounded shoulders  UPPER EXTREMITY AROM/PROM:  A/PROM RIGHT   eval   Shoulder extension 60  Shoulder flexion 154  Shoulder abduction 165  Shoulder internal rotation 78  Shoulder external rotation 100    (Blank rows = not tested)  A/PROM LEFT   eval LEFT 05/29/2023 LEFT 06/07/2023 Left 06/26/23  Shoulder extension 48 pain 54 50 63  Shoulder flexion 132 Pain 142 151 156  Shoulder abduction 70 Pain 127 145 149  Shoulder internal rotation      Shoulder external rotation        (Blank rows = not tested)  CERVICAL AROM: All within functional limits:      UPPER EXTREMITY STRENGTH:   LYMPHEDEMA ASSESSMENTS:   SURGERY TYPE/DATE: left modified radical mastectomy on 12/26/2019   NUMBER OF LYMPH NODES REMOVED: 13 of 16 were positive for cancer  CHEMOTHERAPY: YES  RADIATION:YES 08/24/20-10/05/20  HORMONE TREATMENT: NO  INFECTIONS:    LYMPHEDEMA ASSESSMENTS:   LANDMARK RIGHT  eval  At axilla  34.5  15 cm proximal to olecranon process   10 cm proximal to olecranon process 31.9  Olecranon process 25.5  15 cm proximal to ulnar styloid process   10 cm proximal to ulnar styloid process 22.8  Just proximal to ulnar styloid process 16.1  Across hand at thumb web space 19.6  At base of 2nd digit 6.7  (Blank rows = not tested)  LANDMARK LEFT  eval  At axilla    15 cm proximal to olecranon process   10 cm proximal to olecranon process 32.5   Olecranon process 24.4  15 cm proximal to ulnar styloid process   10 cm proximal to ulnar styloid process 21.5  Just proximal to ulnar styloid process 16.1  Across hand at thumb web space 19.3  At base of 2nd digit 6.6  (Blank rows = not tested)   FUNCTIONAL TESTS:    GAIT:WNL   L-DEX LYMPHEDEMA SCREENING: The patient was assessed using the L-Dex machine today to produce a lymphedema index baseline score. The patient will be reassessed on a regular basis (typically every 3 months) to obtain new L-Dex scores. If the score is > 6.5 points away from his/her baseline score indicating onset of subclinical lymphedema, it will be recommended to wear a compression garment for 4 weeks, 12 hours per day and then be reassessed. If the score continues to be > 6.5 points from baseline at reassessment, we will initiate lymphedema treatment. Assessing in this manner has a 95% rate of preventing clinically significant lymphedema.     QUICK DASH SURVEY: 66% eval,  06/07/2023 59%, 06/26/23 47%                                                                                                                            TREATMENT DATE:  06/26/23: Therapeutic Exercises Pulleys x 2 min flex and abduction, VC's to remind pt of correct technique Tried supine over half foam roll but pt reports feels like that was messing with her vertigo so switched to supine on mat table without pillow for following: Bil UE horz abd and then bil UE scaption into a "V" x 15 each, returning therapist demo Manual Therapy STM with cocoa butter to Lt pect tendon, UT, lateral trunk  PROM left shoulder flexion, scaption, abduction, D2, and IR with scapular depression throughout working to restore functional ROM    06/14/23: Therapeutic Exercises Pulleys x 2 min flex and abduction, VC's to remind pt of correct technique Wall arc x 3 Roll yellow ball up wall into flex and Lt UE abd x 10 each Modified downward dog on wall x 5, 5 sec  holds returning therapist demo  Lt UE OH reach in doorway for Lt lat stretch x 3 returning demo Manual Therapy STM with cocoa butter to Lt pect tendon, UT, lateral trunk  PROM left shoulder flexion, scaption, abduction, and D2 to restore functional ROM and scapular depression throughout stretching   06/07/2023 Pulleys x 2 min flex and abduction Lat stretch on laundry cart x 3 Wall arc x 3 STM with cocoa butter to Lt pect tendon, UT, lateral trunk and then into Lt S/L to periscapular area  PROM left shoulder flexion, scaption, abduction, ER to restore functional ROM Measured AROM and reviewed goals for PN     PATIENT EDUCATION:  Education details: Standing and supine dowel exercises Person educated: Patient Education method: Explanation, Demonstration, and Handouts, verbal and tactile cues Education comprehension: verbalized understanding and returned demonstration, and will benefit from further review  HOME EXERCISE PROGRAM: 4 post op  exercises Dowel exercises in standing and supine  ASSESSMENT:  CLINICAL IMPRESSION: 10th visit progress note sent for Medicare to MD today. Pts Lt shoulder A/ROM has progressed well thus far and she has less pain with A/ROM. Her reports of Lt UE pain throughout the day is also progressing with pt reporting an improvement thus far of 25%, and her pain with reaching for her seat belt and putting her coat on is 20% improved. Also her Quick DASH has improved from 66% limited to 47%. Today progressed pt to include more Lt shoulder A/ROM stretches into her end motions. Also continued manual therapy working to decrease chronic fascial restrictions since mastectomy.   OBJECTIVE IMPAIRMENTS: decreased activity tolerance, decreased knowledge of condition, decreased ROM, decreased strength, hypomobility, increased fascial restrictions, impaired flexibility, impaired UE functional use, postural dysfunction, and pain.   ACTIVITY LIMITATIONS: lifting, reach over  head, and hygiene/grooming  PARTICIPATION LIMITATIONS: driving, occupation, and anything requiring overhead reaching  PERSONAL FACTORS: 3+ comorbidities: Triple Negative breast Cancer s/p chemo and radiation  are also affecting patient's functional outcome.   REHAB POTENTIAL: Good  CLINICAL DECISION MAKING: Stable/uncomplicated  EVALUATION COMPLEXITY: Low  GOALS: Goals reviewed with patient? Yes  SHORT TERM GOALS=LONG TERM GOALS: Target date: 06/07/2023  Pt will be independent with a HEP to improve left shoulder ROM and strength Baseline: Goal status: MET 05/29/2023 2.  Pt will improve left shoulder flexion to atleast 145 degrees for improved reaching Baseline: 132 Goal status: MET 06/07/2023  3.  Pt will improve left shoulder abd to atleast 130 degrees   with 0-min UT compensation Baseline: 70 Goal status: MET 06/07/2023  4.  Left UE pain will be decreased by 50% or greater Baseline: 06/26/23: 25% improvement reported at this time Goal status: In Progress  5.  Quick dash will be improved to no greater than 30% to demonstrate improved function Baseline:  Goal status: In Progress, 59%  6. Able to pull down seat belt with left hand and put coat on with minimal pain  Baseline: 06/26/23: 20% improvement at this time Goal Status: In Progress  PLAN:  PT FREQUENCY: 2x/week  PT DURATION: 4 weeks  PLANNED INTERVENTIONS: 97164- PT Re-evaluation, 97110-Therapeutic exercises, 97530- Therapeutic activity, 97112- Neuromuscular re-education, 97535- Self Care, 32440- Manual therapy, 97760- Orthotic Fit/training, Patient/Family education, Balance training, Dry Needling, and Joint mobilization  PLAN FOR NEXT SESSION:  10 th visit Medicare note sent. Have pt schedule a DN appt as she would like to try this. Cont postural strength and STM prn to pecs/lateral trunk,  DN prn,UT prn, joint mobs, review HEP; try wand flexion and scaption, PROM, decrease UT compensation, progress to strength as ROM  improves.  Hermenia Bers, PTA 06/26/2023, 1:19 PM   SHOULDER: Flexion - Supine (Cane)        Cancer Rehab [phone]    Hold cane in both hands. Raise arms up overhead. Do not allow back to arch. Hold _5__ seconds. Do __5-10__ times; __1-2__ times a day.   SELF ASSISTED WITH OBJECT: Shoulder Abduction / Adduction - Supine    Hold cane with both hands. Move both arms from side to side, keep elbows straight.  Hold when stretch felt for __5__ seconds. Repeat __5-10__ times; __1-2__ times a day. Once this becomes easier progress to third picture bringing affected arm towards ear by staying out to side. Same hold for _5_seconds. Repeat  _5-10_ times, _1-2_ times/day.   SHOULDER: External Rotation - Supine (Cane)    Hold cane with both  hands. Rotate arm away from body. Keep elbow on floor and next to body. _5-10__ reps per set, hold 5 seconds, _1-2__ sets per day. Add towel to keep elbow at side.   Flexion (Eccentric) - Active-Assist (Cane)              Use unaffected arm to push affected arm forward. Avoid hiking shoulder (shoulder should NOT touch cheek). Keep palm relaxed. Slowly lower affected arm. Hold stretch for _5_ seconds repeating _5-10_ times, _1-2_ times a day.  Abduction (Eccentric) - Active-Assist (Cane)    Use unaffected arm to push affected arm out to side. Avoid hiking shoulder (shoulder should NOT touch cheek). Keep palm relaxed. Slowly lower affected arm. Hold stretch _5_ seconds repeating _5-10_ times, _1-2_ times a day.

## 2023-06-28 ENCOUNTER — Ambulatory Visit: Payer: Medicare Other

## 2023-07-03 ENCOUNTER — Ambulatory Visit: Payer: Medicare Other | Admitting: Physical Therapy

## 2023-07-03 ENCOUNTER — Encounter: Payer: Self-pay | Admitting: Physical Therapy

## 2023-07-03 DIAGNOSIS — M25512 Pain in left shoulder: Secondary | ICD-10-CM

## 2023-07-03 DIAGNOSIS — Z171 Estrogen receptor negative status [ER-]: Secondary | ICD-10-CM

## 2023-07-03 DIAGNOSIS — Z483 Aftercare following surgery for neoplasm: Secondary | ICD-10-CM

## 2023-07-03 DIAGNOSIS — M25612 Stiffness of left shoulder, not elsewhere classified: Secondary | ICD-10-CM

## 2023-07-03 DIAGNOSIS — C50412 Malignant neoplasm of upper-outer quadrant of left female breast: Secondary | ICD-10-CM | POA: Diagnosis not present

## 2023-07-03 NOTE — Therapy (Signed)
 OUTPATIENT PHYSICAL THERAPY  UPPER EXTREMITY ONCOLOGY TREATMENT   Patient Name: Lindsay [name] MRN: [id number] DOB:1955-09-06, 68 y.o., female Today's Date: 07/03/2023  END OF SESSION:  PT End of Session - 07/03/23 1709     Visit Number 11    Number of Visits 16    Date for PT Re-Evaluation 07/05/23    PT Start Time 1603    PT Stop Time 1659    PT Time Calculation (min) 56 min    Activity Tolerance Patient tolerated treatment well    Behavior During Therapy WFL for tasks assessed/performed              Past Medical History:  Diagnosis Date   Breast CA (HCC)    Bronchitis    Family history of bone cancer    Family history of breast cancer    Family history of prostate cancer    Fibromyalgia    HCV (hepatitis C virus)    MRSA (methicillin resistant Staphylococcus aureus) 2012   Shingles    Past Surgical History:  Procedure Laterality Date   MASTECTOMY MODIFIED RADICAL Left 12/26/2019   Procedure: LEFT MASTECTOMY MODIFIED RADICAL;  Surgeon: Griselda Miner, MD;  Location: [facility] OR;  Service: General;  Laterality: Left;  PEC BLOCK   PORTACATH PLACEMENT Right 12/26/2019   Procedure: INSERTION PORT-A-CATH WITH ULTRASOUND GUIDANCE;  Surgeon: Griselda Miner, MD;  Location: [facility] OR;  Service: General;  Laterality: Right;   Patient Active Problem List   Diagnosis Date Noted   Chemotherapy induced neutropenia (HCC) 03/31/2020   Port-A-Cath in place 02/04/2020   Family history of breast cancer    Family history of prostate cancer    Family history of bone cancer    Malignant neoplasm of upper-outer quadrant of left breast in female, estrogen receptor negative (HCC) 10/31/2019      REFERRING PROVIDER: Malachy Mood, MD  REFERRING DIAG: Left arm/shoulder pain after surgery  THERAPY DIAG:  Stiffness of left shoulder, not elsewhere classified  Left shoulder pain, unspecified chronicity  Aftercare following surgery for neoplasm  Malignant neoplasm of upper-outer  quadrant of left breast in female, estrogen receptor negative (HCC)  ONSET DATE: 9 months  Rationale for Evaluation and Treatment: Rehabilitation  SUBJECTIVE:                                                                                                                                                                                           SUBJECTIVE I am able to lift my arm more than I was. I have increased mobility almost twice as much. I have been doing my  homework daily.   PERTINENT HISTORY: . 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 Patient underwent a left modified radical mastectomy on 12/26/2019 with 13 of 16 were positive for cancer. The Ki67 is 10%. There may be some cancer attaching the LN to the visceral vein but no new diagnositics have been done.   PAIN:  Are you having pain? Yes NPRS scale:7/10 at worst. Pain location: left underarm Pain orientation: Left  PAIN TYPE: aching and burning, sharp Pain description: constant when using it Aggravating factors: not moving, 1st thing in am,can't lay on left; when overusing Relieving factors: movement, frankincense, tylenol, lidocaine cream  PRECAUTIONS: Left UE lymphedema risk  RED FLAGS: None   WEIGHT BEARING RESTRICTIONS: No  FALLS:  Has patient fallen in last 6 months? Yes. 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  COGNITION: Overall cognitive status: Within functional limits for tasks assessed   PALPATION: Tight in left pectorals especially axillary border with tenderness, several cords palpable in axillary region, but not easily  visible  OBSERVATIONS / OTHER ASSESSMENTS: Wearing lymphdivas 30-40 sleeve, left UT compensation with reaching activities  SENSATION: Light touch: Deficits    POSTURE: forward head, rounded shoulders  UPPER EXTREMITY AROM/PROM:  A/PROM RIGHT   eval   Shoulder extension 60  Shoulder flexion 154  Shoulder abduction 165  Shoulder internal rotation 78  Shoulder external rotation 100    (Blank rows = not tested)  A/PROM LEFT   eval LEFT 05/29/2023 LEFT 06/07/2023 Left 06/26/23  Shoulder extension 48 pain 54 50 63  Shoulder flexion 132 Pain 142 151 156  Shoulder abduction 70 Pain 127 145 149  Shoulder internal rotation      Shoulder external rotation        (Blank rows = not tested)  CERVICAL AROM: All within functional limits:      UPPER EXTREMITY STRENGTH:   LYMPHEDEMA ASSESSMENTS:   SURGERY TYPE/DATE: left modified radical mastectomy on 12/26/2019   NUMBER OF LYMPH NODES REMOVED: 13 of 16 were positive for cancer  CHEMOTHERAPY: YES  RADIATION:YES 08/24/20-10/05/20  HORMONE TREATMENT: NO  INFECTIONS:    LYMPHEDEMA ASSESSMENTS:   LANDMARK RIGHT  eval  At axilla  34.5  15 cm proximal to olecranon process   10 cm proximal to olecranon process 31.9  Olecranon process 25.5  15 cm proximal to ulnar styloid process   10 cm proximal to ulnar styloid process 22.8  Just proximal to ulnar styloid process 16.1  Across hand at thumb web space 19.6  At base of 2nd digit 6.7  (Blank rows = not tested)  LANDMARK LEFT  eval  At axilla    15 cm proximal to olecranon process   10 cm proximal to olecranon process 32.5  Olecranon process 24.4  15 cm proximal to ulnar styloid process   10 cm proximal to ulnar styloid process 21.5  Just proximal to ulnar styloid process 16.1  Across hand at thumb web space 19.3  At base of 2nd digit 6.6  (Blank rows = not tested)   FUNCTIONAL TESTS:    GAIT:WNL   L-DEX LYMPHEDEMA SCREENING: The patient was assessed using the  L-Dex machine today to produce a lymphedema index baseline score. The patient will be reassessed on a regular basis (typically every 3 months) to obtain new L-Dex scores. If the score is > 6.5 points away from his/her baseline score indicating onset of subclinical lymphedema, it will be recommended to wear a compression garment for 4 weeks, 12 hours per day and then be reassessed. If the score continues to be > 6.5 points from baseline at reassessment, we will initiate lymphedema treatment. Assessing in this manner has a 95% rate of preventing clinically significant lymphedema.     QUICK DASH SURVEY: 66% eval,  06/07/2023 59%, 06/26/23 47%                                                                                                                            TREATMENT DATE:  07/03/23: Therapeutic Exercises Pulleys x 2 min flex and abduction, VC's to remind pt of correct technique Supine on mat table with pillow turned long wise for following: Bil UE horz abd and then bil UE scaption into a "V" x 15 each x 5 sec holds, bil UE flexion  [employer] with pt returning therapist demo x 10 reps Manual Therapy STM with cocoa butter to Lt pect tendon, chest and area inferior to mastectomy scar with UE in various degrees of abduction then switched to IASTM using wave tool PROM left shoulder flexion, scaption, and abduction to restore functional ROM    06/26/23: Therapeutic Exercises Pulleys x 2 min flex and abduction, VC's to remind pt of correct technique Tried supine over half foam roll but pt reports feels like that was messing with her vertigo so switched to supine on mat table without pillow for following: Bil UE horz abd and then bil UE scaption into a "V" x 15 each, returning therapist demo Manual Therapy STM with cocoa butter to Lt pect tendon, UT, lateral trunk  PROM left shoulder flexion, scaption, abduction, D2, and IR with scapular depression throughout working to restore functional ROM     06/14/23: Therapeutic Exercises Pulleys x 2 min flex and abduction, VC's to remind pt of correct technique Wall arc x 3 Roll yellow ball up wall into flex and Lt UE abd x 10 each Modified downward dog on wall x 5, 5 sec holds returning therapist demo  Lt UE OH reach in doorway for Lt lat stretch x 3 returning demo Manual Therapy STM with cocoa butter to Lt pect tendon, UT, lateral trunk  PROM left shoulder flexion, scaption, abduction, and D2 to restore functional ROM and scapular depression throughout stretching   06/07/2023 Pulleys x 2 min flex and abduction Lat stretch on laundry cart x 3 Wall arc x 3 STM with cocoa butter to Lt pect tendon, UT, lateral trunk and then into Lt S/L to periscapular area  PROM left shoulder flexion, scaption, abduction, ER to restore functional ROM Measured AROM and reviewed goals for PN     PATIENT EDUCATION:  Education details: Standing and supine dowel exercises Person educated: Patient Education method: Explanation, Demonstration, and Handouts, verbal and tactile cues Education comprehension: verbalized understanding and returned demonstration, and will benefit from further review  HOME EXERCISE PROGRAM: 4 post op exercises Dowel exercises in standing and supine  ASSESSMENT:  CLINICAL IMPRESSION: Continued with PROM and AROM to R shoulder to improve pt's ability to reach overhead. Continued with soft tissue mobilization to L pectoralis muscle and mastectomy scar to decrease tightness to allow improved function.  OBJECTIVE IMPAIRMENTS: decreased activity tolerance, decreased knowledge of condition, decreased ROM, decreased strength, hypomobility, increased fascial restrictions, impaired flexibility, impaired UE functional use, postural dysfunction, and pain.   ACTIVITY LIMITATIONS: lifting, reach over head, and hygiene/grooming  PARTICIPATION LIMITATIONS: driving, occupation, and anything requiring overhead reaching  PERSONAL FACTORS: 3+  comorbidities: Triple Negative breast Cancer s/p chemo and radiation  are also affecting patient's functional outcome.   REHAB POTENTIAL: Good  CLINICAL DECISION MAKING: Stable/uncomplicated  EVALUATION COMPLEXITY: Low  GOALS: Goals reviewed with patient? Yes  SHORT TERM GOALS=LONG TERM GOALS: Target date: 06/07/2023  Pt will be independent with a HEP to improve left shoulder ROM and strength Baseline: Goal status: MET 05/29/2023 2.  Pt will improve left shoulder flexion to atleast 145 degrees for improved reaching Baseline: 132 Goal status: MET 06/07/2023  3.  Pt will improve left shoulder abd to atleast 130 degrees   with 0-min UT compensation Baseline: 70 Goal status: MET 06/07/2023  4.  Left UE pain will be decreased by 50% or greater Baseline: 06/26/23: 25% improvement reported at this time Goal status: In Progress  5.  Quick dash will be improved to no greater than 30% to demonstrate improved function Baseline:  Goal status: In Progress, 59%  6. Able to pull down seat belt with left hand and put coat on with minimal pain  Baseline: 06/26/23: 20% improvement at this time Goal Status: In Progress  PLAN:  PT FREQUENCY: 2x/week  PT DURATION: 4 weeks  PLANNED INTERVENTIONS: 97164- PT Re-evaluation, 97110-Therapeutic exercises, 97530- Therapeutic activity, 97112- Neuromuscular re-education, 97535- Self Care, 16109- Manual therapy, [phone]- Orthotic Fit/training, Patient/Family education, Balance training, Dry Needling, and Joint mobilization  PLAN FOR NEXT SESSION: Cont postural strength and STM prn to pecs/lateral trunk,  DN prn,UT prn, joint mobs, review HEP; try wand flexion and scaption, PROM, decrease UT compensation, progress to strength as ROM improves.  Jordis Repetto [place], PT 07/03/2023, 5:10 PM   SHOULDER: Flexion - Supine (Cane)        Cancer Rehab [phone]    Hold cane in both hands. Raise arms up overhead. Do not allow back to arch. Hold _5__ seconds. Do  __5-10__ times; __1-2__ times a day.   SELF ASSISTED WITH OBJECT: Shoulder Abduction / Adduction - Supine    Hold cane with both hands. Move both arms from side to side, keep elbows straight.  Hold when stretch felt for __5__ seconds. Repeat __5-10__ times; __1-2__ times a day. Once this becomes easier progress to third picture bringing affected arm towards ear by staying out to side. Same hold for _5_seconds. Repeat  _5-10_ times, _1-2_ times/day.   SHOULDER: External Rotation - Supine (Cane)    Hold cane with both hands. Rotate arm away from body. Keep elbow on floor and next to body. _5-10__ reps per set, hold 5 seconds, _1-2__ sets per day. Add towel to keep elbow at side.   Flexion (Eccentric) - Active-Assist (Cane)  Use unaffected arm to push affected arm forward. Avoid hiking shoulder (shoulder should NOT touch cheek). Keep palm relaxed. Slowly lower affected arm. Hold stretch for _5_ seconds repeating _5-10_ times, _1-2_ times a day.  Abduction (Eccentric) - Active-Assist (Cane)    Use unaffected arm to push affected arm out to side. Avoid hiking shoulder (shoulder should NOT touch cheek). Keep palm relaxed. Slowly lower affected arm. Hold stretch _5_ seconds repeating _5-10_ times, _1-2_ times a day.

## 2023-07-05 ENCOUNTER — Telehealth: Payer: Self-pay | Admitting: Emergency Medicine

## 2023-07-05 ENCOUNTER — Ambulatory Visit: Payer: Medicare Other

## 2023-07-05 ENCOUNTER — Ambulatory Visit: Payer: Medicare Other | Admitting: Emergency Medicine

## 2023-07-05 ENCOUNTER — Encounter: Payer: Self-pay | Admitting: Emergency Medicine

## 2023-07-05 VITALS — BP 140/61 | HR 92 | Temp 97.5°F | Ht 62.0 in | Wt 178.4 lb

## 2023-07-05 DIAGNOSIS — R9389 Abnormal findings on diagnostic imaging of other specified body structures: Secondary | ICD-10-CM | POA: Diagnosis not present

## 2023-07-05 DIAGNOSIS — J42 Unspecified chronic bronchitis: Secondary | ICD-10-CM

## 2023-07-05 DIAGNOSIS — M25612 Stiffness of left shoulder, not elsewhere classified: Secondary | ICD-10-CM

## 2023-07-05 DIAGNOSIS — M25512 Pain in left shoulder: Secondary | ICD-10-CM

## 2023-07-05 DIAGNOSIS — I890 Lymphedema, not elsewhere classified: Secondary | ICD-10-CM

## 2023-07-05 DIAGNOSIS — C50412 Malignant neoplasm of upper-outer quadrant of left female breast: Secondary | ICD-10-CM | POA: Diagnosis not present

## 2023-07-05 DIAGNOSIS — Z483 Aftercare following surgery for neoplasm: Secondary | ICD-10-CM

## 2023-07-05 DIAGNOSIS — R053 Chronic cough: Secondary | ICD-10-CM | POA: Diagnosis not present

## 2023-07-05 NOTE — Telephone Encounter (Signed)
 Lmam for resp to contact the patient and make the pft appt for patient

## 2023-07-05 NOTE — Assessment & Plan Note (Signed)
 Radiation-induced Lung Scarring Stable lung scarring noted on serial CT scans, likely secondary to radiation treatment for breast cancer. -At follow-up, discuss how to monitor these scar areas moving forward.

## 2023-07-05 NOTE — Patient Instructions (Signed)
 YOUR PLAN:  -CHRONIC BRONCHITIS: Chronic bronchitis is a long-term inflammation of the airways in the lungs, causing cough and difficulty breathing. We will order pulmonary function tests to check for any asthma component and guide the treatment plan. Please follow up in one month to review the results and discuss the management plan.  -BREAST CANCER: Breast cancer is a malignant tumor that originates in the cells of the breast. You are currently under observation with regular blood work every eight weeks and a recent addition of DNA tumor surveillance. Continue with the current surveillance plan under the care of Dr. Mosetta Putt.  -RADIATION-INDUCED LUNG SCARRING: Radiation-induced lung scarring is a result of radiation therapy, which can cause permanent changes in lung tissue. Your lung scarring is stable as noted on serial CT scans. We will discuss how to monitor these scar areas during your follow-up visit.  -GASTROESOPHAGEAL REFLUX DISEASE (GERD): GERD is a digestive disorder where stomach acid frequently flows back into the tube connecting your mouth and stomach, causing heartburn. Continue managing your symptoms with Prilosec as needed.  INSTRUCTIONS:  Please follow up in one month to review the results of your pulmonary function tests and discuss the management plan for your chronic bronchitis.

## 2023-07-05 NOTE — Therapy (Signed)
 OUTPATIENT PHYSICAL THERAPY  UPPER EXTREMITY ONCOLOGY TREATMENT   Patient Name: Lindsay [name] MRN: [id number] DOB:[birth year], 68 y.o., female Today's Date: 07/05/2023  END OF SESSION:  PT End of Session - 07/05/23 0903     Visit Number 12    Number of Visits 20    Date for PT Re-Evaluation 08/02/23    PT Start Time 0903    PT Stop Time 0952    PT Time Calculation (min) 49 min    Activity Tolerance Patient tolerated treatment well    Behavior During Therapy [facility] for tasks assessed/performed              Past Medical History:  Diagnosis Date   Breast CA (HCC)    Bronchitis    Family history of bone cancer    Family history of breast cancer    Family history of prostate cancer    Fibromyalgia    HCV (hepatitis C virus)    MRSA (methicillin resistant Staphylococcus aureus) 2012   Shingles    Past Surgical History:  Procedure Laterality Date   MASTECTOMY MODIFIED RADICAL Left 12/26/2019   Procedure: LEFT MASTECTOMY MODIFIED RADICAL;  Surgeon: Griselda Miner, MD;  Location: [facility] OR;  Service: General;  Laterality: Left;  PEC BLOCK   PORTACATH PLACEMENT Right 12/26/2019   Procedure: INSERTION PORT-A-CATH WITH ULTRASOUND GUIDANCE;  Surgeon: Griselda Miner, MD;  Location: [facility] OR;  Service: General;  Laterality: Right;   Patient Active Problem List   Diagnosis Date Noted   Chemotherapy induced neutropenia (HCC) 03/31/2020   Port-A-Cath in place 02/04/2020   Family history of breast cancer    Family history of prostate cancer    Family history of bone cancer    Malignant neoplasm of upper-outer quadrant of left breast in female, estrogen receptor negative (HCC) 10/31/2019      REFERRING PROVIDER: Malachy Mood, MD  REFERRING DIAG: Left arm/shoulder pain after surgery  THERAPY DIAG:  Stiffness of left shoulder, not elsewhere classified  Left shoulder pain, unspecified chronicity  Aftercare following surgery for neoplasm  Malignant neoplasm of upper-outer  quadrant of left breast in female, estrogen receptor negative (HCC)  Secondary lymphedema  ONSET DATE: 9 months  Rationale for Evaluation and Treatment: Rehabilitation  SUBJECTIVE:                                                                                                                                                                                           SUBJECTIVE I am able to raise my arm more than I was. I have increased mobility almost twice as much. I am  looking forward to trying DN because we have tried everything else. Overall pain is 40% better. Able to rake and and ride tractor with less pain overall. I still feel like I have to keep my arm moving all the time to keep it loose.  PERTINENT HISTORY: . 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 Patient underwent a left modified radical mastectomy on 12/26/2019 with 13 of 16 were positive for cancer. The Ki67 is 10%. There may be some cancer attaching the LN to the visceral vein but no new diagnositics have been done.   PAIN:  Are you having pain? Yes NPRS scale:0/10 at best, 5/10 at worst Pain location: left underarm Pain orientation: Left  PAIN TYPE: aching and burning, sharp Pain description: constant when using it Aggravating factors: not moving, 1st thing in am,can't lay on left; when overusing Relieving factors: movement, frankincense, tylenol, lidocaine cream  PRECAUTIONS: Left UE lymphedema risk  RED FLAGS: None   WEIGHT BEARING RESTRICTIONS: No  FALLS:  Has patient fallen in last 6 months? Yes. 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  COGNITION: Overall cognitive status: Within functional limits for tasks assessed   PALPATION: Tight in left pectorals especially axillary border with tenderness, several cords palpable in axillary region, but not easily visible  OBSERVATIONS / OTHER ASSESSMENTS: Wearing lymphdivas 30-40 sleeve, left UT compensation with reaching activities  SENSATION: Light touch: Deficits    POSTURE: forward head, rounded shoulders  UPPER EXTREMITY AROM/PROM:  A/PROM RIGHT   eval   Shoulder extension 60  Shoulder flexion 154  Shoulder abduction 165  Shoulder internal rotation 78  Shoulder external rotation 100    (Blank rows = not tested)  A/PROM LEFT   eval LEFT 05/29/2023 LEFT 06/07/2023 Left 06/26/23  Shoulder extension 48 pain 54 50 63  Shoulder flexion 132 Pain 142 151 156  Shoulder abduction 70 Pain 127 145 149  Shoulder internal rotation      Shoulder external rotation        (Blank rows = not tested)  CERVICAL AROM: All within functional limits:      UPPER EXTREMITY STRENGTH:   LYMPHEDEMA ASSESSMENTS:   SURGERY TYPE/DATE: left modified radical mastectomy on 12/26/2019   NUMBER OF LYMPH NODES REMOVED: 13 of 16 were positive for cancer  CHEMOTHERAPY: YES  RADIATION:YES 08/24/20-10/05/20  HORMONE TREATMENT: NO  INFECTIONS:    LYMPHEDEMA ASSESSMENTS:   LANDMARK RIGHT  eval  At axilla  34.5  15 cm proximal to olecranon process   10 cm proximal to olecranon process 31.9  Olecranon process 25.5  15 cm proximal to ulnar styloid process   10 cm proximal to ulnar styloid process 22.8  Just proximal to ulnar styloid process 16.1  Across hand at thumb web space 19.6  At base of 2nd digit 6.7  (Blank rows = not tested)  LANDMARK LEFT  eval  At axilla    15 cm proximal to olecranon process   10 cm proximal to olecranon process 32.5  Olecranon process 24.4  15 cm proximal to ulnar styloid process   10 cm proximal to ulnar styloid process 21.5  Just  proximal to ulnar styloid process 16.1  Across hand at thumb web space 19.3  At base of 2nd digit 6.6  (Blank rows = not tested)   FUNCTIONAL TESTS:    GAIT:WNL   L-DEX LYMPHEDEMA SCREENING: The patient was assessed using the L-Dex machine today to produce a lymphedema index baseline score. The patient will be reassessed on a regular basis (typically every 3 months) to obtain new L-Dex scores. If the score is > 6.5 points away from his/her baseline score indicating onset of subclinical lymphedema, it will be recommended to wear a compression garment for 4 weeks, 12 hours per day and then be reassessed. If the score continues to be > 6.5 points from baseline at reassessment, we will initiate lymphedema treatment. Assessing in this manner has a 95% rate of preventing clinically significant lymphedema.     QUICK DASH SURVEY: 66% eval,  06/07/2023 59%, 06/26/23 47%, 07/05/23 30%                                                                                                                            TREATMENT DATE:  07/05/2023 Pulleys flexion and abd x 2 min Ball rolls on wall x 10 forward, 5 abd Supine horizontal abd Left only with PT holding band x 10 yellow Supine scaption with yellow x 10 MFR left chest region STM left pectorals in stargazer position with cocoabutter PROM left shoulder flexion, scaption, and abduction to restore functional ROM  07/03/23: Therapeutic Exercises Pulleys x 2 min flex and abduction, VC's to remind pt of correct technique Supine on mat table with pillow turned long wise for following: Bil UE horz abd and then bil UE scaption into a "V" x 15 each x 5 sec holds, bil UE flexion  [employer] with pt returning therapist demo x 10 reps Manual Therapy STM with cocoa butter to Lt pect tendon, chest and area inferior to mastectomy scar with UE in various degrees of abduction then switched to IASTM using wave tool PROM left shoulder flexion, scaption, and abduction  to restore functional ROM    06/26/23: Therapeutic Exercises Pulleys x 2 min flex and abduction, VC's to remind pt of correct technique Tried supine over half foam roll but pt reports feels like that was messing with her vertigo so switched to supine on mat table without pillow for following: Bil UE horz abd and then bil UE scaption into a "V" x 15 each, returning therapist demo Manual Therapy STM with cocoa butter to Lt pect tendon, UT, lateral trunk  PROM left shoulder flexion, scaption, abduction, D2, and IR with scapular depression throughout working to restore functional ROM    06/14/23: Therapeutic Exercises Pulleys x 2 min flex and abduction, VC's to remind pt of correct technique Wall arc x 3 Roll yellow ball up wall into flex and Lt UE abd x 10 each Modified downward dog on wall x 5, 5 sec holds returning therapist demo  Lt UE OH  reach in doorway for Lt lat stretch x 3 returning demo Manual Therapy STM with cocoa butter to Lt pect tendon, UT, lateral trunk  PROM left shoulder flexion, scaption, abduction, and D2 to restore functional ROM and scapular depression throughout stretching   06/07/2023 Pulleys x 2 min flex and abduction Lat stretch on laundry cart x 3 Wall arc x 3 STM with cocoa butter to Lt pect tendon, UT, lateral trunk and then into Lt S/L to periscapular area  PROM left shoulder flexion, scaption, abduction, ER to restore functional ROM Measured AROM and reviewed goals for PN     PATIENT EDUCATION:  Education details: Standing and supine dowel exercises Person educated: Patient Education method: Explanation, Demonstration, and Handouts, verbal and tactile cues Education comprehension: verbalized understanding and returned demonstration, and will benefit from further review  HOME EXERCISE PROGRAM: 4 post op exercises Dowel exercises in standing and supine  ASSESSMENT:  CLINICAL IMPRESSION: Pt is improving overall with pain improved 40% since start of  treatment. She notes good improvement with mobility but still has difficulty reaching to pull down her seat belt. Quick dash has improved from 66% at evaluation to 30% today showing good functional improvement.  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    OBJECTIVE IMPAIRMENTS: decreased activity tolerance, decreased knowledge of condition, decreased ROM, decreased strength, hypomobility, increased fascial restrictions, impaired flexibility, impaired UE functional use, postural dysfunction, and pain.   ACTIVITY LIMITATIONS: lifting, reach over head, and hygiene/grooming  PARTICIPATION LIMITATIONS: driving, occupation, and anything requiring overhead reaching  PERSONAL FACTORS: 3+ comorbidities: Triple Negative breast Cancer s/p chemo and radiation  are also affecting patient's functional outcome.   REHAB POTENTIAL: Good  CLINICAL DECISION MAKING: Stable/uncomplicated  EVALUATION COMPLEXITY: Low  GOALS: Goals reviewed with patient? Yes  SHORT TERM GOALS=LONG TERM GOALS: Target date: 06/07/2023  Pt will be independent with a HEP to improve left shoulder ROM and strength Baseline: Goal status: MET 05/29/2023 2.  Pt will improve left shoulder flexion to atleast 145 degrees for improved reaching Baseline: 132 Goal status: MET 06/07/2023  3.  Pt will improve left shoulder abd to atleast 130 degrees   with 0-min UT compensation Baseline: 70 Goal status: MET 06/07/2023  4.  Left UE pain will be decreased by 50% or greater Baseline: 06/26/23: 40% improvement reported at this time Goal status: In Progress  5.  Quick dash will be improved to no greater than 30% to demonstrate improved function Baseline:  Goal status: MET 30% 07/05/2023  6. Able to pull down seat belt with left hand and put coat on  with minimal pain  Baseline: 06/26/23:  Goal Status: In Progress: able to put on coat better, but still has difficulty with seat belt  PLAN:  PT FREQUENCY: 2x/week  PT DURATION: 4 weeks  PLANNED INTERVENTIONS: 97164- PT Re-evaluation, 97110-Therapeutic exercises, 97530- Therapeutic activity, 97112- Neuromuscular re-education, 97535- Self Care, 13086- Manual therapy, 97760- Orthotic Fit/training, Patient/Family education, Balance training, Dry Needling, and Joint mobilization  PLAN FOR NEXT SESSION: Cont postural strength and STM prn to pecs/lateral trunk,  DN prn,UT prn, joint mobs, review HEP; try wand flexion and scaption, PROM, decrease UT compensation, progress to strength as ROM improves.  Waynette Buttery, PT 07/05/2023, 9:58 AM   SHOULDER: Flexion - Supine (Cane)  Cancer Rehab [phone]    Hold cane in both hands. Raise arms up overhead. Do not allow back to arch. Hold _5__ seconds. Do __5-10__ times; __1-2__ times a day.   SELF ASSISTED WITH OBJECT: Shoulder Abduction / Adduction - Supine    Hold cane with both hands. Move both arms from side to side, keep elbows straight.  Hold when stretch felt for __5__ seconds. Repeat __5-10__ times; __1-2__ times a day. Once this becomes easier progress to third picture bringing affected arm towards ear by staying out to side. Same hold for _5_seconds. Repeat  _5-10_ times, _1-2_ times/day.   SHOULDER: External Rotation - Supine (Cane)    Hold cane with both hands. Rotate arm away from body. Keep elbow on floor and next to body. _5-10__ reps per set, hold 5 seconds, _1-2__ sets per day. Add towel to keep elbow at side.   Flexion (Eccentric) - Active-Assist (Cane)              Use unaffected arm to push affected arm forward. Avoid hiking shoulder (shoulder should NOT touch cheek). Keep palm relaxed. Slowly lower affected arm. Hold stretch for _5_ seconds repeating _5-10_ times, _1-2_ times a day.  Abduction (Eccentric) -  Active-Assist (Cane)    Use unaffected arm to push affected arm out to side. Avoid hiking shoulder (shoulder should NOT touch cheek). Keep palm relaxed. Slowly lower affected arm. Hold stretch _5_ seconds repeating _5-10_ times, _1-2_ times a day.

## 2023-07-05 NOTE — Telephone Encounter (Signed)
 This is Dr Kavin Leech cousin, She needs a appt for PFT @ [facility] before his next appt. . Can you be sure the order for the PFT was put in correctly? (Newer CMA) Thanks.

## 2023-07-05 NOTE — Progress Notes (Signed)
 Subjective:    Patient ID: Lindsay [name], female    DOB: [birth year], 68 y.o.   MRN: [id number]  HPI Lindsay [name] is a 68 year old female with chronic bronchitis who presents for evaluation of chronic bronchitis symptoms. She was referred by her oncologist for evaluation of her chronic bronchitis.  She experiences chronic bronchitis symptoms, particularly during the winter months, with episodes occurring at least once a year. Symptoms include difficulty breathing, a burning sensation in the chest, and coughing, which sometimes produces blood-tinged sputum. These symptoms are exacerbated by cold air and dusty environments. A notable episode in May 2024 required a course of antibiotics, albuterol, and prednisone, although she felt the antibiotic course was insufficient.  She had COVID-19 in September 2022, which was mild and treated with medication. 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 She has a history of radiation treatment for breast cancer, resulting in stable scarring on her lungs, potentially contributing to her respiratory symptoms.  She has a history of minimal tobacco use, having smoked from 1972 to 1976, and ceased smoking entirely in the 1980s. She also had significant secondhand smoke exposure during childhood. She denies current smoking and reports no asthma history.  She experiences occasional heartburn and indigestion, particularly after consuming certain foods, and has been diagnosed with a hiatal hernia. She manages these symptoms with Prilosec as needed.   RADIOLOGY CT chest: Stable pulmonary scar tissue, no significant changes compared to previous CTs (12/2022)   Review of Systems As per HPI  Past Medical History:  Diagnosis Date   Breast CA (HCC)    Bronchitis    Family history of bone cancer    Family history of breast cancer    Family history of prostate cancer    Fibromyalgia    HCV (hepatitis C  virus)    MRSA (methicillin resistant Staphylococcus aureus) 2012   Shingles      Family History  Problem Relation Age of Onset   Breast cancer Maternal Aunt 78   Prostate cancer Maternal Uncle        dx. in his 71s   Bone cancer Maternal Grandmother        dx. 56   Cancer Paternal Grandmother        possible cancer, unknown type    Prostate cancer Cousin        maternal first cousin   Heart Problems Brother    Bipolar disorder Brother    Cancer Cousin        unknown type, paternal first cousin     Social History   Socioeconomic History   Marital status: Single    Spouse name: Not on file   Number of children: Not on file   Years of education: Not on file   Highest education level: Not on file  Occupational History   Not on file  Tobacco Use   Smoking status: Former    Types: Cigarettes    Start date: 36    Quit date: 1973    Years since quitting: 52.1   Smokeless tobacco: Never  Vaping Use   Vaping status: Never Used  Substance and Sexual Activity   Alcohol use: Not Currently   Drug use: Never   Sexual activity: Not Currently  Other Topics Concern   Not on file  Social History Narrative   Not on file   Social Drivers of Health   Financial Resource Strain: Not on  file  Food Insecurity: Not on file  Transportation Needs: Not on file  Physical Activity: Not on file  Stress: Not on file  Social Connections: Unknown (09/20/2021)   Received from [facility], [facility]   Social Network    Social Network: Not on file  Intimate Partner Violence: Unknown (08/11/2021)   Received from [facility], [facility]   HITS    Physically Hurt: Not on file    Insult or Talk Down To: Not on file    Threaten Physical Harm: Not on file    Scream or Curse: Not on file     Allergies  Allergen Reactions   Codeine Hives and Nausea And Vomiting     Outpatient Medications Prior to Visit  Medication Sig Dispense Refill   acetaminophen (TYLENOL) 650 MG CR tablet  Take 650 mg by mouth every 8 (eight) hours as needed for pain.      ascorbic acid (VITAMIN C) 250 MG CHEW Chew by mouth.     lidocaine (XYLOCAINE) 2 % solution Use as directed 15 mLs in the mouth or throat every 4 (four) hours as needed for mouth pain. 100 mL 2   omeprazole (PRILOSEC) 20 MG capsule Take 1 capsule (20 mg total) by mouth daily. 30 capsule 2   valACYclovir (VALTREX) 1000 MG tablet Take 1 tablet (1,000 mg total) by mouth 2 (two) times daily. 14 tablet 1   benzonatate (TESSALON) 200 MG capsule Take 1 capsule (200 mg total) by mouth 3 (three) times daily as needed for cough. (Patient not taking: Reported on 07/05/2023) 20 capsule 0   dexamethasone (DECADRON) 0.5 MG/5ML solution Take 10 ml (1 mg) swish for 2 minutes and then spit.  Do this four times daily as needed Do Not eat or drink for 1 hour after. (Patient not taking: Reported on 07/05/2023) 240 mL 0   diphenoxylate-atropine (LOMOTIL) 2.5-0.025 MG tablet Take 1-2 tablets by mouth 4 (four) times daily as needed for diarrhea or loose stools. (Patient not taking: Reported on 07/05/2023) 30 tablet 1   doxycycline (VIBRA-TABS) 100 MG tablet Take 1 tablet (100 mg total) by mouth 2 (two) times daily. (Patient not taking: Reported on 07/05/2023) 14 tablet 0   gabapentin (NEURONTIN) 300 MG capsule Take 300 mg by mouth 3 (three) times daily as needed. (Patient not taking: Reported on 07/05/2023)     levofloxacin (LEVAQUIN) 500 MG tablet Take 1 tablet (500 mg total) by mouth daily. (Patient not taking: Reported on 07/05/2023) 7 tablet 0   lidocaine-prilocaine (EMLA) cream Apply 1 Application topically as needed. (Patient not taking: Reported on 07/05/2023) 30 g 2   methocarbamol (ROBAXIN) 750 MG tablet Take 1 tablet (750 mg total) by mouth 4 (four) times daily as needed (use for muscle cramps/pain). (Patient not taking: Reported on 07/05/2023) 30 tablet 2   mucosal barrier oral (GELCLAIR) GEL Take 1 packet by mouth as needed. (Patient not taking: Reported  on 07/05/2023)     No facility-administered medications prior to visit.        Objective:   Physical Exam  Vitals:   07/05/23 1047  BP: (!) 140/61  Pulse: 92  Temp: (!) 97.5 F (36.4 C)  TempSrc: Temporal  SpO2: 93%  Weight: 178 lb 6.4 oz (80.9 kg)  Height: 5\' 2"  (1.575 m)   Gen: Pleasant, well-nourished, in no distress,  normal affect  ENT: No lesions,  mouth clear,  oropharynx clear, no postnasal drip  Neck: No JVD, no stridor  Lungs: No  use of accessory muscles, no crackles or wheezing on normal respiration, no wheeze on forced expiration  Cardiovascular: RRR, heart sounds normal, no murmur or gallops, no peripheral edema  Musculoskeletal: No deformities, no cyanosis or clubbing  Neuro: alert, awake, non focal  Skin: Warm, no lesions or rash     Assessment & Plan:  Chronic cough She describes chronic bronchitic symptoms, does have some chronic upper airway cough as well.  Question whether there may be underlying obstructive lung disease as she does describe some chest burning/tightness and she may have benefited from SABA in the past.  She has bilateral patchy interstitial changes consistent with radiation scarring that could contribute to bronchiectasis, mucous retention, put her at risk for either bronchitis or pneumonia. -Order pulmonary function tests to assess for possible asthma component and to guide treatment plan. -Follow-up in one month to review results and discuss management plan. 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 -At follow-up, discuss how to monitor these scar areas moving forward.   Levy Pupa, MD, PhD 07/05/2023, 5:03 PM [facility] [phone] or if no answer before 7:00PM call  [phone] For any issues after 7:00PM please call eLink [phone]

## 2023-07-05 NOTE — Assessment & Plan Note (Signed)
 Lindsay [name] describes chronic bronchitic symptoms, does have some chronic upper airway cough as well.  Question whether there may be underlying obstructive lung disease as Lindsay [name] does describe some chest burning/tightness and Lindsay [name] may have benefited from SABA in the past.  Lindsay [name] has bilateral patchy interstitial changes consistent with radiation scarring that could contribute to bronchiectasis, mucous retention, put her at risk for either bronchitis or pneumonia. -Order pulmonary function tests to assess for possible asthma component and to guide treatment plan. -Follow-up in one month to review results and discuss management plan. -Work to treat underlying potential contributors to upper airway irritation including rhinitis, GERD

## 2023-07-10 ENCOUNTER — Ambulatory Visit: Payer: Medicare Other | Attending: Hematology

## 2023-07-10 DIAGNOSIS — M25612 Stiffness of left shoulder, not elsewhere classified: Secondary | ICD-10-CM | POA: Diagnosis present

## 2023-07-10 DIAGNOSIS — C50412 Malignant neoplasm of upper-outer quadrant of left female breast: Secondary | ICD-10-CM | POA: Diagnosis present

## 2023-07-10 DIAGNOSIS — Z483 Aftercare following surgery for neoplasm: Secondary | ICD-10-CM | POA: Diagnosis present

## 2023-07-10 DIAGNOSIS — M25512 Pain in left shoulder: Secondary | ICD-10-CM | POA: Insufficient documentation

## 2023-07-10 DIAGNOSIS — I890 Lymphedema, not elsewhere classified: Secondary | ICD-10-CM | POA: Diagnosis present

## 2023-07-10 DIAGNOSIS — Z171 Estrogen receptor negative status [ER-]: Secondary | ICD-10-CM | POA: Diagnosis present

## 2023-07-10 NOTE — Therapy (Signed)
 OUTPATIENT PHYSICAL THERAPY  UPPER EXTREMITY ONCOLOGY TREATMENT   Patient Name: Lindsay [name] MRN: [id number] DOB:December 06, 1955, 68 y.o., female Today's Date: 07/10/2023  END OF SESSION:  PT End of Session - 07/10/23 1146     Visit Number 13    Date for PT Re-Evaluation 08/02/23    PT Start Time 1103    PT Stop Time 1146    PT Time Calculation (min) 43 min    Activity Tolerance Patient tolerated treatment well    Behavior During Therapy WFL for tasks assessed/performed               Past Medical History:  Diagnosis Date   Breast CA (HCC)    Bronchitis    Family history of bone cancer    Family history of breast cancer    Family history of prostate cancer    Fibromyalgia    HCV (hepatitis C virus)    MRSA (methicillin resistant Staphylococcus aureus) 2012   Shingles    Past Surgical History:  Procedure Laterality Date   MASTECTOMY MODIFIED RADICAL Left 12/26/2019   Procedure: LEFT MASTECTOMY MODIFIED RADICAL;  Surgeon: Griselda Miner, MD;  Location: [facility] OR;  Service: General;  Laterality: Left;  PEC BLOCK   PORTACATH PLACEMENT Right 12/26/2019   Procedure: INSERTION PORT-A-CATH WITH ULTRASOUND GUIDANCE;  Surgeon: Griselda Miner, MD;  Location: [facility] OR;  Service: General;  Laterality: Right;   Patient Active Problem List   Diagnosis Date Noted   Chronic cough 07/05/2023   Abnormal CT of the chest 07/05/2023   Chemotherapy induced neutropenia (HCC) 03/31/2020   Port-A-Cath in place 02/04/2020   Family history of breast cancer    Family history of prostate cancer    Family history of bone cancer    Malignant neoplasm of upper-outer quadrant of left breast in female, estrogen receptor negative (HCC) 10/31/2019      REFERRING PROVIDER: Malachy Mood, MD  REFERRING DIAG: Left arm/shoulder pain after surgery  THERAPY DIAG:  Stiffness of left shoulder, not elsewhere classified  Left shoulder pain, unspecified chronicity  Aftercare following surgery for  neoplasm  ONSET DATE: 9 months  Rationale for Evaluation and Treatment: Rehabilitation  SUBJECTIVE:                                                                                                                                                                                           SUBJECTIVE I've been using my arm a lot over the past few days at home.  I've been doing my exercises.   PERTINENT HISTORY: . 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 Patient underwent a left modified radical mastectomy on 12/26/2019 with 13 of 16 were positive for cancer. The Ki67 is 10%. There may be some cancer attaching the LN to the visceral vein but no new diagnositics have been done.   PAIN:  Are you having pain? Yes NPRS scale:0/10 at best, 5/10 at worst Pain location: left underarm Pain orientation: Left  PAIN TYPE: aching and burning, sharp Pain description: constant when using it Aggravating factors: not moving, 1st thing in am,can't lay on left; when overusing Relieving factors: movement, frankincense, tylenol, lidocaine cream  PRECAUTIONS: Left UE lymphedema risk  RED FLAGS: None   WEIGHT BEARING RESTRICTIONS: No  FALLS:  Has patient fallen in last 6 months? Yes. 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  COGNITION: Overall cognitive status: Within functional limits for tasks assessed   PALPATION: Tight in left pectorals especially axillary border with tenderness, several cords palpable in axillary region, but not easily visible  OBSERVATIONS / OTHER ASSESSMENTS: Wearing lymphdivas 30-40 sleeve, left  UT compensation with reaching activities  SENSATION: Light touch: Deficits    POSTURE: forward head, rounded shoulders  UPPER EXTREMITY AROM/PROM:  A/PROM RIGHT   eval   Shoulder extension 60  Shoulder flexion 154  Shoulder abduction 165  Shoulder internal rotation 78  Shoulder external rotation 100    (Blank rows = not tested)  A/PROM LEFT   eval LEFT 05/29/2023 LEFT 06/07/2023 Left 06/26/23  Shoulder extension 48 pain 54 50 63  Shoulder flexion 132 Pain 142 151 156  Shoulder abduction 70 Pain 127 145 149  Shoulder internal rotation      Shoulder external rotation        (Blank rows = not tested)  CERVICAL AROM: All within functional limits:      UPPER EXTREMITY STRENGTH:   LYMPHEDEMA ASSESSMENTS:   SURGERY TYPE/DATE: left modified radical mastectomy on 12/26/2019   NUMBER OF LYMPH NODES REMOVED: 13 of 16 were positive for cancer  CHEMOTHERAPY: YES  RADIATION:YES 08/24/20-10/05/20  HORMONE TREATMENT: NO  INFECTIONS:    LYMPHEDEMA ASSESSMENTS:   LANDMARK RIGHT  eval  At axilla  34.5  15 cm proximal to olecranon process   10 cm proximal to olecranon process 31.9  Olecranon process 25.5  15 cm proximal to ulnar styloid process   10 cm proximal to ulnar styloid process 22.8  Just proximal to ulnar styloid process 16.1  Across hand at thumb web space 19.6  At base of 2nd digit 6.7  (Blank rows = not tested)  LANDMARK LEFT  eval  At axilla    15 cm proximal to olecranon process   10 cm proximal to olecranon process 32.5  Olecranon process 24.4  15 cm proximal to ulnar styloid process   10 cm proximal to ulnar styloid process 21.5  Just proximal to ulnar styloid process 16.1  Across hand at thumb web space 19.3  At base of 2nd digit 6.6  (Blank rows = not tested)    GAIT:WNL   L-DEX LYMPHEDEMA SCREENING: The patient was assessed using the L-Dex machine today to produce a lymphedema index baseline score. The patient will be reassessed  on a  regular basis (typically every 3 months) to obtain new L-Dex scores. If the score is > 6.5 points away from his/her baseline score indicating onset of subclinical lymphedema, it will be recommended to wear a compression garment for 4 weeks, 12 hours per day and then be reassessed. If the score continues to be > 6.5 points from baseline at reassessment, we will initiate lymphedema treatment. Assessing in this manner has a 95% rate of preventing clinically significant lymphedema.   QUICK DASH SURVEY: 66% eval,  06/07/2023 59%, 06/26/23 47%, 07/05/23 30%                                                                                                                            TREATMENT DATE:   07/10/2023 Pulleys flexion and abd x 2 min Ball rolls on wall x 10 forward, 5 abd Doorway pec stretch 10" x5 Open book to stretch Lt pec x10 Trigger Point Dry Needling  Initial Treatment: Pt instructed on Dry Needling rational, procedures, and possible side effects. Pt instructed to expect mild to moderate muscle soreness later in the day and/or into the next day.  Pt instructed in methods to reduce muscle soreness. Pt instructed to continue prescribed HEP. Because Dry Needling was performed over or adjacent to a lung field, pt was educated on S/S of pneumothorax and to seek immediate medical attention should they occur.  Patient was educated on signs and symptoms of infection and other risk factors and advised to seek medical attention should they occur.  Patient verbalized understanding of these instructions and education.   Patient Verbal Consent Given: Yes Education Handout Provided: Yes Muscles Treated: Lt pec major and minor, Lt lats  Treatment Response/Outcome: twitch response and improved tissue mobility  Elongation and release to Lt shoulder and pec and lats after dry needling   07/05/2023 Pulleys flexion and abd x 2 min Ball rolls on wall x 10 forward, 5 abd Supine horizontal abd Left only with  PT holding band x 10 yellow Supine scaption with yellow x 10 MFR left chest region STM left pectorals in stargazer position with cocoabutter PROM left shoulder flexion, scaption, and abduction to restore functional ROM  07/03/23: Therapeutic Exercises Pulleys x 2 min flex and abduction, VC's to remind pt of correct technique Supine on mat table with pillow turned long wise for following: Bil UE horz abd and then bil UE scaption into a "V" x 15 each x 5 sec holds, bil UE flexion  [employer] with pt returning therapist demo x 10 reps Manual Therapy STM with cocoa butter to Lt pect tendon, chest and area inferior to mastectomy scar with UE in various degrees of abduction then switched to IASTM using wave tool PROM left shoulder flexion, scaption, and abduction to restore functional ROM    06/26/23: Therapeutic Exercises Pulleys x 2 min flex and abduction, VC's to remind pt of correct technique Tried supine over half foam roll but pt reports  feels like that was messing with her vertigo so switched to supine on mat table without pillow for following: Bil UE horz abd and then bil UE scaption into a "V" x 15 each, returning therapist demo Manual Therapy STM with cocoa butter to Lt pect tendon, UT, lateral trunk  PROM left shoulder flexion, scaption, abduction, D2, and IR with scapular depression throughout working to restore functional ROM     PATIENT EDUCATION:  Education details: Standing and supine dowel exercises Person educated: Patient Education method: Programmer, multimedia, Demonstration, and Handouts, verbal and tactile cues Education comprehension: verbalized understanding and returned demonstration, and will benefit from further review  HOME EXERCISE PROGRAM: 4 post op exercises Dowel exercises in standing and supine  ASSESSMENT:  CLINICAL IMPRESSION: Pt continues to report  40% improvement since start of treatment. She notes good improvement with mobility but still has difficulty  reaching to pull down her seat belt. Quick dash has improved from 66% at evaluation to 30% since the start of care,  showing good functional improvement.  Good response to dry needling today with twitch response and improved tissue mobility after manual therapy.  PT encouraged pt to stretch after session x 2 days to maximize benefit of dry needling. She will benefit from continued skilled Pt to continue to improve function and pain levels to return to her active lifestyle.    OBJECTIVE IMPAIRMENTS: decreased activity tolerance, decreased knowledge of condition, decreased ROM, decreased strength, hypomobility, increased fascial restrictions, impaired flexibility, impaired UE functional use, postural dysfunction, and pain.   ACTIVITY LIMITATIONS: lifting, reach over head, and hygiene/grooming  PARTICIPATION LIMITATIONS: driving, occupation, and anything requiring overhead reaching  PERSONAL FACTORS: 3+ comorbidities: Triple Negative breast Cancer s/p chemo and radiation  are also affecting patient's functional outcome.   REHAB POTENTIAL: Good  CLINICAL DECISION MAKING: Stable/uncomplicated  EVALUATION COMPLEXITY: Low  GOALS: Goals reviewed with patient? Yes  SHORT TERM GOALS=LONG TERM GOALS: Target date: 06/07/2023  Pt will be independent with a HEP to improve left shoulder ROM and strength Baseline: Goal status: MET 05/29/2023 2.  Pt will improve left shoulder flexion to atleast 145 degrees for improved reaching Baseline: 132 Goal status: MET 06/07/2023  3.  Pt will improve left shoulder abd to atleast 130 degrees   with 0-min UT compensation Baseline: 70 Goal status: MET 06/07/2023  4.  Left UE pain will be decreased by 50% or greater Baseline: 06/26/23: 40% improvement reported at this time Goal status: In Progress  5.  Quick dash will be improved to no greater than 30% to demonstrate improved function Baseline:  Goal status: MET 30% 07/05/2023  6. Able to pull down seat belt with  left hand and put coat on with minimal pain  Baseline: 06/26/23:  Goal Status: In Progress: able to put on coat better, but still has difficulty with seat belt  PLAN:  PT FREQUENCY: 2x/week  PT DURATION: 4 weeks  PLANNED INTERVENTIONS: 97164- PT Re-evaluation, 97110-Therapeutic exercises, 97530- Therapeutic activity, 97112- Neuromuscular re-education, 97535- Self Care, [id number]- Manual therapy, 97760- Orthotic Fit/training, Patient/Family education, Balance training, Dry Needling, and Joint mobilization  PLAN FOR NEXT SESSION: Cont postural strength and STM prn to pecs/lateral trunk,  assess response to dry needling   Lorrene Reid, PT 07/10/23 11:48 AM    SHOULDER: Flexion - Supine (Cane)        Cancer Rehab [phone]    Hold cane in both hands. Raise arms up overhead. Do not allow back to arch. Hold _5__ seconds. Do __5-10__ times;  __1-2__ times a day.   SELF ASSISTED WITH OBJECT: Shoulder Abduction / Adduction - Supine    Hold cane with both hands. Move both arms from side to side, keep elbows straight.  Hold when stretch felt for __5__ seconds. Repeat __5-10__ times; __1-2__ times a day. Once this becomes easier progress to third picture bringing affected arm towards ear by staying out to side. Same hold for _5_seconds. Repeat  _5-10_ times, _1-2_ times/day.   SHOULDER: External Rotation - Supine (Cane)    Hold cane with both hands. Rotate arm away from body. Keep elbow on floor and next to body. _5-10__ reps per set, hold 5 seconds, _1-2__ sets per day. Add towel to keep elbow at side.   Flexion (Eccentric) - Active-Assist (Cane)              Use unaffected arm to push affected arm forward. Avoid hiking shoulder (shoulder should NOT touch cheek). Keep palm relaxed. Slowly lower affected arm. Hold stretch for _5_ seconds repeating _5-10_ times, _1-2_ times a day.  Abduction (Eccentric) - Active-Assist (Cane)    Use unaffected arm to push affected arm out to side.  Avoid hiking shoulder (shoulder should NOT touch cheek). Keep palm relaxed. Slowly lower affected arm. Hold stretch _5_ seconds repeating _5-10_ times, _1-2_ times a day.

## 2023-07-10 NOTE — Patient Instructions (Signed)

## 2023-07-12 ENCOUNTER — Encounter: Payer: Self-pay | Admitting: Physical Therapy

## 2023-07-12 ENCOUNTER — Ambulatory Visit (HOSPITAL_COMMUNITY)
Admission: RE | Admit: 2023-07-12 | Discharge: 2023-07-12 | Disposition: A | Discharge status: Home / Self Care | Payer: Medicare Other | Source: Ambulatory Visit | Attending: Emergency Medicine | Admitting: Emergency Medicine

## 2023-07-12 ENCOUNTER — Ambulatory Visit: Payer: Medicare Other | Admitting: Physical Therapy

## 2023-07-12 DIAGNOSIS — J42 Unspecified chronic bronchitis: Secondary | ICD-10-CM | POA: Insufficient documentation

## 2023-07-12 DIAGNOSIS — M25612 Stiffness of left shoulder, not elsewhere classified: Secondary | ICD-10-CM

## 2023-07-12 DIAGNOSIS — Z87891 Personal history of nicotine dependence: Secondary | ICD-10-CM | POA: Diagnosis not present

## 2023-07-12 DIAGNOSIS — M25512 Pain in left shoulder: Secondary | ICD-10-CM

## 2023-07-12 DIAGNOSIS — Z483 Aftercare following surgery for neoplasm: Secondary | ICD-10-CM

## 2023-07-12 DIAGNOSIS — I890 Lymphedema, not elsewhere classified: Secondary | ICD-10-CM

## 2023-07-12 DIAGNOSIS — Z171 Estrogen receptor negative status [ER-]: Secondary | ICD-10-CM

## 2023-07-12 LAB — PULMONARY FUNCTION TEST
DL/VA % pred: 98 %
DL/VA: 4.16 ml/min/mmHg/L
DLCO unc % pred: 82 %
DLCO unc: 14.54 ml/min/mmHg
FEF 25-75 Post: 2.41 L/s
FEF 25-75 Pre: 1.82 L/s
FEF2575-%Change-Post: 32 %
FEF2575-%Pred-Post: 135 %
FEF2575-%Pred-Pre: 102 %
FEV1-%Change-Post: 2 %
FEV1-%Pred-Post: 102 %
FEV1-%Pred-Pre: 100 %
FEV1-Post: 2.08 L
FEV1-Pre: 2.04 L
FEV1FVC-%Change-Post: 6 %
FEV1FVC-%Pred-Pre: 106 %
FEV6-%Change-Post: -3 %
FEV6-%Pred-Post: 95 %
FEV6-%Pred-Pre: 98 %
FEV6-Post: 2.43 L
FEV6-Pre: 2.51 L
FEV6FVC-%Change-Post: 0 %
FEV6FVC-%Pred-Post: 104 %
FEV6FVC-%Pred-Pre: 104 %
FVC-%Change-Post: -3 %
FVC-%Pred-Post: 90 %
FVC-%Pred-Pre: 94 %
FVC-Post: 2.43 L
FVC-Pre: 2.52 L
Post FEV1/FVC ratio: 86 %
Post FEV6/FVC ratio: 100 %
Pre FEV1/FVC ratio: 81 %
Pre FEV6/FVC Ratio: 100 %
RV % pred: 67 %
RV: 1.36 L
TLC % pred: 84 %
TLC: 3.9 L

## 2023-07-12 MED ORDER — ALBUTEROL SULFATE (2.5 MG/3ML) 0.083% IN NEBU
2.5000 mg | INHALATION_SOLUTION | Freq: Once | RESPIRATORY_TRACT | Status: AC
  Administered 2023-07-12: 2.5 mg via RESPIRATORY_TRACT

## 2023-07-12 NOTE — Therapy (Signed)
 OUTPATIENT PHYSICAL THERAPY  UPPER EXTREMITY ONCOLOGY TREATMENT   Patient Name: Lindsay [name] MRN: [id number] DOB:[birth year], 68 y.o., female Today's Date: 07/12/2023  END OF SESSION:  PT End of Session - 07/12/23 1101     Visit Number 14    Number of Visits 20    Date for PT Re-Evaluation 08/02/23    PT Start Time 1002    PT Stop Time 1051    PT Time Calculation (min) 49 min    Activity Tolerance Patient tolerated treatment well    Behavior During Therapy WFL for tasks assessed/performed                Past Medical History:  Diagnosis Date   Breast CA (HCC)    Bronchitis    Family history of bone cancer    Family history of breast cancer    Family history of prostate cancer    Fibromyalgia    HCV (hepatitis C virus)    MRSA (methicillin resistant Staphylococcus aureus) 2012   Shingles    Past Surgical History:  Procedure Laterality Date   MASTECTOMY MODIFIED RADICAL Left 12/26/2019   Procedure: LEFT MASTECTOMY MODIFIED RADICAL;  Surgeon: Griselda Miner, MD;  Location: [facility] OR;  Service: General;  Laterality: Left;  PEC BLOCK   PORTACATH PLACEMENT Right 12/26/2019   Procedure: INSERTION PORT-A-CATH WITH ULTRASOUND GUIDANCE;  Surgeon: Griselda Miner, MD;  Location: [facility] OR;  Service: General;  Laterality: Right;   Patient Active Problem List   Diagnosis Date Noted   Chronic cough 07/05/2023   Abnormal CT of the chest 07/05/2023   Chemotherapy induced neutropenia (HCC) 03/31/2020   Port-A-Cath in place 02/04/2020   Family history of breast cancer    Family history of prostate cancer    Family history of bone cancer    Malignant neoplasm of upper-outer quadrant of left breast in female, estrogen receptor negative (HCC) 10/31/2019      REFERRING PROVIDER: Malachy Mood, MD  REFERRING DIAG: Left arm/shoulder pain after surgery  THERAPY DIAG:  Stiffness of left shoulder, not elsewhere classified  Left shoulder pain, unspecified chronicity  Aftercare  following surgery for neoplasm  Malignant neoplasm of upper-outer quadrant of left breast in female, estrogen receptor negative (HCC)  Secondary lymphedema  ONSET DATE: 9 months  Rationale for Evaluation and Treatment: Rehabilitation  SUBJECTIVE:                                                                                                                                                                                           SUBJECTIVE I think the dry needling went  really good. My arm was a little sore but I think that was the position that I was in. I have been using my arm and been doing my exercises.   PERTINENT HISTORY: . 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 Patient underwent a left modified radical mastectomy on 12/26/2019 with 13 of 16 were positive for cancer. The Ki67 is 10%. There may be some cancer attaching the LN to the visceral vein but no new diagnositics have been done.   PAIN:  Are you having pain? Yes NPRS scale:0/10 at best, 3/10 at worst Pain location: left underarm Pain orientation: Left  PAIN TYPE: aching and burning, sharp Pain description: constant when using it Aggravating factors: not moving, 1st thing in am,can't lay on left; when overusing Relieving factors: movement, frankincense, tylenol, lidocaine cream  PRECAUTIONS: Left UE lymphedema risk  RED FLAGS: None   WEIGHT BEARING RESTRICTIONS: No  FALLS:  Has patient fallen in last 6 months? Yes. 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  COGNITION: Overall cognitive status: Within functional limits for tasks  assessed   PALPATION: Tight in left pectorals especially axillary border with tenderness, several cords palpable in axillary region, but not easily visible  OBSERVATIONS / OTHER ASSESSMENTS: Wearing lymphdivas 30-40 sleeve, left UT compensation with reaching activities  SENSATION: Light touch: Deficits    POSTURE: forward head, rounded shoulders  UPPER EXTREMITY AROM/PROM:  A/PROM RIGHT   eval   Shoulder extension 60  Shoulder flexion 154  Shoulder abduction 165  Shoulder internal rotation 78  Shoulder external rotation 100    (Blank rows = not tested)  A/PROM LEFT   eval LEFT 05/29/2023 LEFT 06/07/2023 Left 06/26/23 LEFT 07/12/23  Shoulder extension 48 pain 54 50 63 60  Shoulder flexion 132 Pain 142 151 156 159  Shoulder abduction 70 Pain 127 145 149 138 with pain  Shoulder internal rotation       Shoulder external rotation         (Blank rows = not tested)  CERVICAL AROM: All within functional limits:      UPPER EXTREMITY STRENGTH:   LYMPHEDEMA ASSESSMENTS:   SURGERY TYPE/DATE: left modified radical mastectomy on 12/26/2019   NUMBER OF LYMPH NODES REMOVED: 13 of 16 were positive for cancer  CHEMOTHERAPY: YES  RADIATION:YES 08/24/20-10/05/20  HORMONE TREATMENT: NO  INFECTIONS:    LYMPHEDEMA ASSESSMENTS:   LANDMARK RIGHT  eval  At axilla  34.5  15 cm proximal to olecranon process   10 cm proximal to olecranon process 31.9  Olecranon process 25.5  15 cm proximal to ulnar styloid process   10 cm proximal to ulnar styloid process 22.8  Just proximal to ulnar styloid process 16.1  Across hand at thumb web space 19.6  At base of 2nd digit 6.7  (Blank rows = not tested)  LANDMARK LEFT  eval  At axilla    15 cm proximal to olecranon process   10 cm proximal to olecranon process 32.5  Olecranon process 24.4  15 cm proximal to ulnar styloid process   10 cm proximal to ulnar styloid process 21.5  Just proximal to ulnar styloid process 16.1  Across hand  at thumb web space 19.3  At base of 2nd digit 6.6  (Blank rows = not tested)    GAIT:WNL   L-DEX LYMPHEDEMA SCREENING: The patient was assessed using the L-Dex machine today to produce a lymphedema index baseline score. The patient will be reassessed on a regular basis (typically every 3 months) to obtain new L-Dex scores. If the score is > 6.5 points away from his/her baseline score indicating onset of subclinical lymphedema, it will be recommended to wear a compression garment for 4 weeks, 12 hours per day and then be reassessed. If the score continues to be > 6.5 points from baseline at reassessment, we will initiate lymphedema treatment. Assessing in this manner has a 95% rate of preventing clinically significant lymphedema.   QUICK DASH SURVEY: 66% eval,  06/07/2023 59%, 06/26/23 47%, 07/05/23 30%                                                                                                                            TREATMENT DATE:  07/12/23 Pulleys x 2 min in direction of flexion and 2 min in direction of abduction with v/c and t/c for correct UE positioning throughout Supine scap exercises on large mat table with yellow band x 10 reps each in to narrow and wide grip, horizontal abduction, diagonals with pt returning therapist demo for each Ball up wall x 10 reps in direction of flexion and 10 in L abduction with v/c and t/c for form IASTM with cocoa butter and boomerang tool and WAVE to L pec and L lateral trunk with pt reporting decreased tightness at end of session and improved mobility/ Pec has increased fascial adhesions  07/10/2023 Pulleys flexion and abd x 2 min Ball rolls on wall x 10 forward, 5 abd Doorway pec stretch 10" x5 Open book to stretch Lt pec x10 Trigger Point Dry Needling  Initial Treatment: Pt instructed on Dry Needling rational, procedures, and possible side effects. Pt instructed to expect mild to moderate muscle soreness later in the day and/or into the next  day.  Pt instructed in methods to reduce muscle soreness. Pt instructed to continue prescribed HEP. Because Dry Needling was performed over or adjacent to a lung field, pt was educated on S/S of pneumothorax and to seek immediate medical attention should they occur.  Patient was educated on signs and symptoms of infection and other risk factors and advised to seek medical attention should they occur.  Patient verbalized understanding of these instructions and education.   Patient Verbal Consent Given: Yes Education Handout Provided: Yes Muscles Treated: Lt pec major and minor, Lt lats  Treatment Response/Outcome: twitch response and improved tissue mobility  Elongation and release to Lt shoulder and pec and lats after dry needling   07/05/2023 Pulleys flexion and abd x 2 min Ball rolls on wall x 10 forward, 5 abd Supine horizontal abd Left only with PT holding band x 10 yellow Supine scaption with  yellow x 10 MFR left chest region STM left pectorals in stargazer position with cocoabutter PROM left shoulder flexion, scaption, and abduction to restore functional ROM  07/03/23: Therapeutic Exercises Pulleys x 2 min flex and abduction, VC's to remind pt of correct technique Supine on mat table with pillow turned long wise for following: Bil UE horz abd and then bil UE scaption into a "V" x 15 each x 5 sec holds, bil UE flexion  [employer] with pt returning therapist demo x 10 reps Manual Therapy STM with cocoa butter to Lt pect tendon, chest and area inferior to mastectomy scar with UE in various degrees of abduction then switched to IASTM using wave tool PROM left shoulder flexion, scaption, and abduction to restore functional ROM    06/26/23: Therapeutic Exercises Pulleys x 2 min flex and abduction, VC's to remind pt of correct technique Tried supine over half foam roll but pt reports feels like that was messing with her vertigo so switched to supine on mat table without pillow for  following: Bil UE horz abd and then bil UE scaption into a "V" x 15 each, returning therapist demo Manual Therapy STM with cocoa butter to Lt pect tendon, UT, lateral trunk  PROM left shoulder flexion, scaption, abduction, D2, and IR with scapular depression throughout working to restore functional ROM     PATIENT EDUCATION:  Education details: Standing and supine dowel exercises Person educated: Patient Education method: Programmer, multimedia, Demonstration, and Handouts, verbal and tactile cues Education comprehension: verbalized understanding and returned demonstration, and will benefit from further review  HOME EXERCISE PROGRAM: 4 post op exercises Dowel exercises in standing and supine Supine scap series  ASSESSMENT:  CLINICAL IMPRESSION: Remeasured ROM and her abduction was down some today though flexion had improved a few degrees. Instructed pt in supine scapular series and added these to her HEP. Continued with IASTM today with pt reporting decreased tightness and improved mobility by end of session. Educated pt on proper shoulder positioning throughout exercises today.    OBJECTIVE IMPAIRMENTS: decreased activity tolerance, decreased knowledge of condition, decreased ROM, decreased strength, hypomobility, increased fascial restrictions, impaired flexibility, impaired UE functional use, postural dysfunction, and pain.   ACTIVITY LIMITATIONS: lifting, reach over head, and hygiene/grooming  PARTICIPATION LIMITATIONS: driving, occupation, and anything requiring overhead reaching  PERSONAL FACTORS: 3+ comorbidities: Triple Negative breast Cancer s/p chemo and radiation  are also affecting patient's functional outcome.   REHAB POTENTIAL: Good  CLINICAL DECISION MAKING: Stable/uncomplicated  EVALUATION COMPLEXITY: Low  GOALS: Goals reviewed with patient? Yes  SHORT TERM GOALS=LONG TERM GOALS: Target date: 06/07/2023  Pt will be independent with a HEP to improve left shoulder ROM and  strength Baseline: Goal status: MET 05/29/2023 2.  Pt will improve left shoulder flexion to atleast 145 degrees for improved reaching Baseline: 132 Goal status: MET 06/07/2023  3.  Pt will improve left shoulder abd to atleast 130 degrees   with 0-min UT compensation Baseline: 70 Goal status: MET 06/07/2023  4.  Left UE pain will be decreased by 50% or greater Baseline: 06/26/23: 40% improvement reported at this time Goal status: In Progress  5.  Quick dash will be improved to no greater than 30% to demonstrate improved function Baseline:  Goal status: MET 30% 07/05/2023  6. Able to pull down seat belt with left hand and put coat on with minimal pain  Baseline: 06/26/23:  Goal Status: In Progress: able to put on coat better, but still has difficulty with seat belt  PLAN:  PT FREQUENCY: 2x/week  PT DURATION: 4 weeks  PLANNED INTERVENTIONS: 97164- PT Re-evaluation, 97110-Therapeutic exercises, 97530- Therapeutic activity, 97112- Neuromuscular re-education, 97535- Self Care, 16109- Manual therapy, 97760- Orthotic Fit/training, Patient/Family education, Balance training, Dry Needling, and Joint mobilization  PLAN FOR NEXT SESSION: Cont postural strength and STM prn to pecs/lateral trunk,  assess response to dry needling , how are supine scap?  Lorrene Reid, PT 07/12/23 11:03 AM    SHOULDER: Flexion - Supine (Cane)        Cancer Rehab [id number]    Hold cane in both hands. Raise arms up overhead. Do not allow back to arch. Hold _5__ seconds. Do __5-10__ times; __1-2__ times a day.   SELF ASSISTED WITH OBJECT: Shoulder Abduction / Adduction - Supine    Hold cane with both hands. Move both arms from side to side, keep elbows straight.  Hold when stretch felt for __5__ seconds. Repeat __5-10__ times; __1-2__ times a day. Once this becomes easier progress to third picture bringing affected arm towards ear by staying out to side. Same hold for _5_seconds. Repeat  _5-10_ times, _1-2_  times/day.   SHOULDER: External Rotation - Supine (Cane)    Hold cane with both hands. Rotate arm away from body. Keep elbow on floor and next to body. _5-10__ reps per set, hold 5 seconds, _1-2__ sets per day. Add towel to keep elbow at side.   Flexion (Eccentric) - Active-Assist (Cane)              Use unaffected arm to push affected arm forward. Avoid hiking shoulder (shoulder should NOT touch cheek). Keep palm relaxed. Slowly lower affected arm. Hold stretch for _5_ seconds repeating _5-10_ times, _1-2_ times a day.  Abduction (Eccentric) - Active-Assist (Cane)    Use unaffected arm to push affected arm out to side. Avoid hiking shoulder (shoulder should NOT touch cheek). Keep palm relaxed. Slowly lower affected arm. Hold stretch _5_ seconds repeating _5-10_ times, _1-2_ times a day.

## 2023-07-12 NOTE — Patient Instructions (Signed)
 Over Head Pull: Narrow and Wide Grip   Cancer Rehab [phone]   On back, knees bent, feet flat, band across thighs, elbows straight but relaxed. Pull hands apart (start). Keeping elbows straight, bring arms up and over head, hands toward floor. Keep pull steady on band. Hold momentarily. Return slowly, keeping pull steady, back to start. Then do same with a wider grip on the band (past shoulder width) Repeat _10__ times. Band color __yellow____   Side Pull: Double Arm   On back, knees bent, feet flat. Arms perpendicular to body, shoulder level, elbows straight but relaxed. Pull arms out to sides, elbows straight. Resistance band comes across collarbones, hands toward floor. Hold momentarily. Slowly return to starting position. Repeat _10__ times. Band color _yellow____   Sword   On back, knees bent, feet flat, left hand on left hip, right hand above left. Pull right arm DIAGONALLY (hip to shoulder) across chest. Bring right arm along head toward floor. Hold momentarily. Slowly return to starting position. Thumb is pointed down when by opposite hip and rotates upwards as arm goes up towards head.  Repeat _10__ times. Do with left arm. Band color _yellow_____   Shoulder Rotation: Double Arm   On back, knees bent, feet flat, elbows tucked at sides, bent 90, hands palms up. Pull hands apart and down toward floor, keeping elbows near sides. Hold momentarily. Slowly return to starting position. Repeat _10__ times. Band color __yellow____

## 2023-07-17 ENCOUNTER — Ambulatory Visit: Payer: Medicare Other

## 2023-07-17 DIAGNOSIS — Z483 Aftercare following surgery for neoplasm: Secondary | ICD-10-CM

## 2023-07-17 DIAGNOSIS — M25512 Pain in left shoulder: Secondary | ICD-10-CM

## 2023-07-17 DIAGNOSIS — M25612 Stiffness of left shoulder, not elsewhere classified: Secondary | ICD-10-CM

## 2023-07-17 NOTE — Therapy (Signed)
 OUTPATIENT PHYSICAL THERAPY  UPPER EXTREMITY ONCOLOGY TREATMENT   Patient Name: Lindsay [name] MRN: [id number] DOB:1956/02/05, 68 y.o., female Today's Date: 07/17/2023  END OF SESSION:  PT End of Session - 07/17/23 1139     Visit Number 15    Date for PT Re-Evaluation 08/02/23    Authorization Type Medicare B    PT Start Time 1102    PT Stop Time 1141    PT Time Calculation (min) 39 min    Activity Tolerance Patient tolerated treatment well    Behavior During Therapy WFL for tasks assessed/performed                 Past Medical History:  Diagnosis Date   Breast CA (HCC)    Bronchitis    Family history of bone cancer    Family history of breast cancer    Family history of prostate cancer    Fibromyalgia    HCV (hepatitis C virus)    MRSA (methicillin resistant Staphylococcus aureus) 2012   Shingles    Past Surgical History:  Procedure Laterality Date   MASTECTOMY MODIFIED RADICAL Left 12/26/2019   Procedure: LEFT MASTECTOMY MODIFIED RADICAL;  Surgeon: Griselda Miner, MD;  Location: [facility] OR;  Service: General;  Laterality: Left;  PEC BLOCK   PORTACATH PLACEMENT Right 12/26/2019   Procedure: INSERTION PORT-A-CATH WITH ULTRASOUND GUIDANCE;  Surgeon: Griselda Miner, MD;  Location: [facility] OR;  Service: General;  Laterality: Right;   Patient Active Problem List   Diagnosis Date Noted   Chronic cough 07/05/2023   Abnormal CT of the chest 07/05/2023   Chemotherapy induced neutropenia (HCC) 03/31/2020   Port-A-Cath in place 02/04/2020   Family history of breast cancer    Family history of prostate cancer    Family history of bone cancer    Malignant neoplasm of upper-outer quadrant of left breast in female, estrogen receptor negative (HCC) 10/31/2019      REFERRING PROVIDER: Malachy Mood, MD  REFERRING DIAG: Left arm/shoulder pain after surgery  THERAPY DIAG:  Stiffness of left shoulder, not elsewhere classified  Left shoulder pain, unspecified  chronicity  Aftercare following surgery for neoplasm  ONSET DATE: 9 months  Rationale for Evaluation and Treatment: Rehabilitation  SUBJECTIVE:                                                                                                                                                                                           SUBJECTIVE The dry needling helped and I can reach back a little better.    PERTINENT HISTORY: . 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 Patient underwent a left modified radical mastectomy on 12/26/2019 with 13 of 16 were positive for cancer. The Ki67 is 10%. There may be some cancer attaching the LN to the visceral vein but no new diagnositics have been done.   PAIN:  Are you having pain? Yes NPRS scale:0/10 at best, 3/10 at worst Pain location: left underarm Pain orientation: Left  PAIN TYPE: aching and burning, sharp Pain description: constant when using it Aggravating factors: not moving, 1st thing in am,can't lay on left; when overusing Relieving factors: movement, frankincense, tylenol, lidocaine cream  PRECAUTIONS: Left UE lymphedema risk  RED FLAGS: None   WEIGHT BEARING RESTRICTIONS: No  FALLS:  Has patient fallen in last 6 months? Yes. 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  COGNITION: Overall cognitive status: Within functional limits for tasks assessed   PALPATION: Tight in left pectorals especially axillary border with tenderness, several cords palpable in axillary region, but not easily visible  OBSERVATIONS / OTHER ASSESSMENTS: Wearing lymphdivas  30-40 sleeve, left UT compensation with reaching activities  SENSATION: Light touch: Deficits    POSTURE: forward head, rounded shoulders  UPPER EXTREMITY AROM/PROM:  A/PROM RIGHT   eval   Shoulder extension 60  Shoulder flexion 154  Shoulder abduction 165  Shoulder internal rotation 78  Shoulder external rotation 100    (Blank rows = not tested)  A/PROM LEFT   eval LEFT 05/29/2023 LEFT 06/07/2023 Left 06/26/23 LEFT 07/12/23  Shoulder extension 48 pain 54 50 63 60  Shoulder flexion 132 Pain 142 151 156 159  Shoulder abduction 70 Pain 127 145 149 138 with pain  Shoulder internal rotation       Shoulder external rotation         (Blank rows = not tested)  CERVICAL AROM: All within functional limits:      UPPER EXTREMITY STRENGTH:   LYMPHEDEMA ASSESSMENTS:   SURGERY TYPE/DATE: left modified radical mastectomy on 12/26/2019   NUMBER OF LYMPH NODES REMOVED: 13 of 16 were positive for cancer  CHEMOTHERAPY: YES  RADIATION:YES 08/24/20-10/05/20  HORMONE TREATMENT: NO  INFECTIONS:    LYMPHEDEMA ASSESSMENTS:   LANDMARK RIGHT  eval  At axilla  34.5  15 cm proximal to olecranon process   10 cm proximal to olecranon process 31.9  Olecranon process 25.5  15 cm proximal to ulnar styloid process   10 cm proximal to ulnar styloid process 22.8  Just proximal to ulnar styloid process 16.1  Across hand at thumb web space 19.6  At base of 2nd digit 6.7  (Blank rows = not tested)  LANDMARK LEFT  eval  At axilla    15 cm proximal to olecranon process   10 cm proximal to olecranon process 32.5  Olecranon process 24.4  15 cm proximal to ulnar styloid process   10 cm proximal to ulnar styloid process 21.5  Just proximal to ulnar styloid process 16.1  Across hand at thumb web space 19.3  At base of 2nd digit 6.6  (Blank rows = not tested)    GAIT:WNL   L-DEX LYMPHEDEMA SCREENING: The patient was assessed using the L-Dex machine today to  produce a lymphedema index  baseline score. The patient will be reassessed on a regular basis (typically every 3 months) to obtain new L-Dex scores. If the score is > 6.5 points away from his/her baseline score indicating onset of subclinical lymphedema, it will be recommended to wear a compression garment for 4 weeks, 12 hours per day and then be reassessed. If the score continues to be > 6.5 points from baseline at reassessment, we will initiate lymphedema treatment. Assessing in this manner has a 95% rate of preventing clinically significant lymphedema.   QUICK DASH SURVEY: 66% eval,  06/07/2023 59%, 06/26/23 47%, 07/05/23 30%                                                                                                                            TREATMENT DATE:   07/10/2023 Pulleys flexion and abd x 2 min Seated ball rolls: flexion x10 Doorway pec stretch 10" x5 Standing open book stretch at wall x10 bil each   Trigger Point Dry Needling  Subsequent Treatment: Instructions provided previously at initial dry needling treatment.  Patient Verbal Consent Given: Yes Education Handout Provided: Previously Provided Muscles Treated: Lt pec major and minor, Lt lats  Electrical Stimulation Performed: No Treatment Response/Outcome: twitch response and improved tissue mobility  Elongation and release to Lt shoulder and pec and lats after dry needling   07/12/23 Pulleys x 2 min in direction of flexion and 2 min in direction of abduction with v/c and t/c for correct UE positioning throughout Supine scap exercises on large mat table with yellow band x 10 reps each in to narrow and wide grip, horizontal abduction, diagonals with pt returning therapist demo for each Ball up wall x 10 reps in direction of flexion and 10 in L abduction with v/c and t/c for form IASTM with cocoa butter and boomerang tool and WAVE to L pec and L lateral trunk with pt reporting decreased tightness at end of session and improved mobility/ Pec has increased  fascial adhesions  07/10/2023 Pulleys flexion and abd x 2 min Ball rolls on wall x 10 forward, 5 abd Doorway pec stretch 10" x5 Open book to stretch Lt pec x10 Trigger Point Dry Needling  Initial Treatment: Pt instructed on Dry Needling rational, procedures, and possible side effects. Pt instructed to expect mild to moderate muscle soreness later in the day and/or into the next day.  Pt instructed in methods to reduce muscle soreness. Pt instructed to continue prescribed HEP. Because Dry Needling was performed over or adjacent to a lung field, pt was educated on S/S of pneumothorax and to seek immediate medical attention should they occur.  Patient was educated on signs and symptoms of infection and other risk factors and advised to seek medical attention should they occur.  Patient verbalized understanding of these instructions and education.   Patient Verbal Consent Given: Yes Education Handout Provided: Yes Muscles Treated: Lt pec major and minor, Lt lats  Treatment  Response/Outcome: twitch response and improved tissue mobility  Elongation and release to Lt shoulder and pec and lats after dry needling   07/05/2023 Pulleys flexion and abd x 2 min Ball rolls on wall x 10 forward, 5 abd Supine horizontal abd Left only with PT holding band x 10 yellow Supine scaption with yellow x 10 MFR left chest region STM left pectorals in stargazer position with cocoabutter PROM left shoulder flexion, scaption, and abduction to restore functional ROM  07/03/23: Therapeutic Exercises Pulleys x 2 min flex and abduction, VC's to remind pt of correct technique Supine on mat table with pillow turned long wise for following: Bil UE horz abd and then bil UE scaption into a "V" x 15 each x 5 sec holds, bil UE flexion  [employer] with pt returning therapist demo x 10 reps Manual Therapy STM with cocoa butter to Lt pect tendon, chest and area inferior to mastectomy scar with UE in various degrees of  abduction then switched to IASTM using wave tool PROM left shoulder flexion, scaption, and abduction to restore functional ROM      PATIENT EDUCATION:  Education details: Standing and supine dowel exercises Person educated: Patient Education method: Explanation, Demonstration, and Handouts, verbal and tactile cues Education comprehension: verbalized understanding and returned demonstration, and will benefit from further review  HOME EXERCISE PROGRAM: 4 post op exercises Dowel exercises in standing and supine Supine scap series  ASSESSMENT:  CLINICAL IMPRESSION: Pt reports positive response to dry needling last session and remains limited with stretch behind the back.  This is improving.  Pt is independent and compliant with HEP for flexibility and strength.  Good response to dry needling with improved tissue mobility and multiple twitch response. PT added doorway pec stretch to HEP and PT emphasized importance of flexibility to maximize dry needling benefit.  PT monitored throughout session for pain, technique and response to treatment.  Patient will benefit from skilled PT to address the below impairments and improve overall function.    OBJECTIVE IMPAIRMENTS: decreased activity tolerance, decreased knowledge of condition, decreased ROM, decreased strength, hypomobility, increased fascial restrictions, impaired flexibility, impaired UE functional use, postural dysfunction, and pain.   ACTIVITY LIMITATIONS: lifting, reach over head, and hygiene/grooming  PARTICIPATION LIMITATIONS: driving, occupation, and anything requiring overhead reaching  PERSONAL FACTORS: 3+ comorbidities: Triple Negative breast Cancer s/p chemo and radiation  are also affecting patient's functional outcome.   REHAB POTENTIAL: Good  CLINICAL DECISION MAKING: Stable/uncomplicated  EVALUATION COMPLEXITY: Low  GOALS: Goals reviewed with patient? Yes  SHORT TERM GOALS=LONG TERM GOALS: Target date:  06/07/2023  Pt will be independent with a HEP to improve left shoulder ROM and strength Baseline: Goal status: MET 05/29/2023 2.  Pt will improve left shoulder flexion to atleast 145 degrees for improved reaching Baseline: 132 Goal status: MET 06/07/2023  3.  Pt will improve left shoulder abd to atleast 130 degrees   with 0-min UT compensation Baseline: 70 Goal status: MET 06/07/2023  4.  Left UE pain will be decreased by 50% or greater Baseline: 06/26/23: 40% improvement reported at this time Goal status: In Progress  5.  Quick dash will be improved to no greater than 30% to demonstrate improved function Baseline:  Goal status: MET 30% 07/05/2023  6. Able to pull down seat belt with left hand and put coat on with minimal pain  Baseline: 06/26/23:  Goal Status: In Progress: able to put on coat better, but still has difficulty with seat belt  PLAN:  PT FREQUENCY: 2x/week  PT DURATION: 4 weeks  PLANNED INTERVENTIONS: 97164- PT Re-evaluation, 97110-Therapeutic exercises, 97530- Therapeutic activity, 97112- Neuromuscular re-education, 97535- Self Care, 78295- Manual therapy, 97760- Orthotic Fit/training, Patient/Family education, Balance training, Dry Needling, and Joint mobilization  PLAN FOR NEXT SESSION: Cont postural strength and STM prn to pecs/lateral trunk,  assess response to dry needling , how are supine scap?  Lorrene Reid, PT 07/17/23 11:42 AM    SHOULDER: Flexion - Supine (Cane)        Cancer Rehab [phone]    Hold cane in both hands. Raise arms up overhead. Do not allow back to arch. Hold _5__ seconds. Do __5-10__ times; __1-2__ times a day.   SELF ASSISTED WITH OBJECT: Shoulder Abduction / Adduction - Supine    Hold cane with both hands. Move both arms from side to side, keep elbows straight.  Hold when stretch felt for __5__ seconds. Repeat __5-10__ times; __1-2__ times a day. Once this becomes easier progress to third picture bringing affected arm towards ear by  staying out to side. Same hold for _5_seconds. Repeat  _5-10_ times, _1-2_ times/day.   SHOULDER: External Rotation - Supine (Cane)    Hold cane with both hands. Rotate arm away from body. Keep elbow on floor and next to body. _5-10__ reps per set, hold 5 seconds, _1-2__ sets per day. Add towel to keep elbow at side.   Flexion (Eccentric) - Active-Assist (Cane)              Use unaffected arm to push affected arm forward. Avoid hiking shoulder (shoulder should NOT touch cheek). Keep palm relaxed. Slowly lower affected arm. Hold stretch for _5_ seconds repeating _5-10_ times, _1-2_ times a day.  Abduction (Eccentric) - Active-Assist (Cane)    Use unaffected arm to push affected arm out to side. Avoid hiking shoulder (shoulder should NOT touch cheek). Keep palm relaxed. Slowly lower affected arm. Hold stretch _5_ seconds repeating _5-10_ times, _1-2_ times a day.

## 2023-07-19 ENCOUNTER — Encounter: Payer: Self-pay | Admitting: Hematology

## 2023-07-19 ENCOUNTER — Ambulatory Visit: Payer: Medicare Other

## 2023-07-19 ENCOUNTER — Inpatient Hospital Stay (HOSPITAL_BASED_OUTPATIENT_CLINIC_OR_DEPARTMENT_OTHER): Payer: Medicare Other | Admitting: Hematology

## 2023-07-19 ENCOUNTER — Inpatient Hospital Stay: Payer: Medicare Other | Attending: Nurse Practitioner

## 2023-07-19 VITALS — BP 126/78 | HR 71 | Temp 97.5°F | Resp 20 | Ht 62.0 in | Wt 175.4 lb

## 2023-07-19 DIAGNOSIS — Z483 Aftercare following surgery for neoplasm: Secondary | ICD-10-CM

## 2023-07-19 DIAGNOSIS — M25612 Stiffness of left shoulder, not elsewhere classified: Secondary | ICD-10-CM

## 2023-07-19 DIAGNOSIS — I890 Lymphedema, not elsewhere classified: Secondary | ICD-10-CM

## 2023-07-19 DIAGNOSIS — C50412 Malignant neoplasm of upper-outer quadrant of left female breast: Secondary | ICD-10-CM | POA: Diagnosis not present

## 2023-07-19 DIAGNOSIS — Z95828 Presence of other vascular implants and grafts: Secondary | ICD-10-CM

## 2023-07-19 DIAGNOSIS — Z171 Estrogen receptor negative status [ER-]: Secondary | ICD-10-CM | POA: Diagnosis not present

## 2023-07-19 DIAGNOSIS — Z853 Personal history of malignant neoplasm of breast: Secondary | ICD-10-CM | POA: Insufficient documentation

## 2023-07-19 DIAGNOSIS — Z1231 Encounter for screening mammogram for malignant neoplasm of breast: Secondary | ICD-10-CM | POA: Diagnosis not present

## 2023-07-19 DIAGNOSIS — Z08 Encounter for follow-up examination after completed treatment for malignant neoplasm: Secondary | ICD-10-CM | POA: Insufficient documentation

## 2023-07-19 DIAGNOSIS — M25512 Pain in left shoulder: Secondary | ICD-10-CM

## 2023-07-19 LAB — CBC WITH DIFFERENTIAL (CANCER CENTER ONLY)
Abs Immature Granulocytes: 0.01 10*3/uL (ref 0.00–0.07)
Basophils Absolute: 0.1 10*3/uL (ref 0.0–0.1)
Basophils Relative: 1 %
Eosinophils Absolute: 0.3 10*3/uL (ref 0.0–0.5)
Eosinophils Relative: 4 %
HCT: 40.5 % (ref 36.0–46.0)
Hemoglobin: 13.8 g/dL (ref 12.0–15.0)
Immature Granulocytes: 0 %
Lymphocytes Relative: 35 %
Lymphs Abs: 2.1 10*3/uL (ref 0.7–4.0)
MCH: 31.4 pg (ref 26.0–34.0)
MCHC: 34.1 g/dL (ref 30.0–36.0)
MCV: 92 fL (ref 80.0–100.0)
Monocytes Absolute: 0.5 10*3/uL (ref 0.1–1.0)
Monocytes Relative: 8 %
Neutro Abs: 3.1 10*3/uL (ref 1.7–7.7)
Neutrophils Relative %: 52 %
Platelet Count: 150 10*3/uL (ref 150–400)
RBC: 4.4 MIL/uL (ref 3.87–5.11)
RDW: 12.7 % (ref 11.5–15.5)
WBC Count: 6 10*3/uL (ref 4.0–10.5)
nRBC: 0 % (ref 0.0–0.2)

## 2023-07-19 LAB — CMP (CANCER CENTER ONLY)
ALT: 13 U/L (ref 0–44)
AST: 31 U/L (ref 15–41)
Albumin: 4.1 g/dL (ref 3.5–5.0)
Alkaline Phosphatase: 63 U/L (ref 38–126)
Anion gap: 3 — ABNORMAL LOW (ref 5–15)
BUN: 15 mg/dL (ref 8–23)
CO2: 29 mmol/L (ref 22–32)
Calcium: 8.8 mg/dL — ABNORMAL LOW (ref 8.9–10.3)
Chloride: 104 mmol/L (ref 98–111)
Creatinine: 0.64 mg/dL (ref 0.44–1.00)
GFR, Estimated: >60 mL/min (ref >60)
Glucose, Bld: 102 mg/dL — ABNORMAL HIGH (ref 70–99)
Potassium: 4.1 mmol/L (ref 3.5–5.1)
Sodium: 136 mmol/L (ref 135–145)
Total Bilirubin: 0.7 mg/dL (ref 0.0–1.2)
Total Protein: 6.3 g/dL — ABNORMAL LOW (ref 6.5–8.1)

## 2023-07-19 MED ORDER — SODIUM CHLORIDE 0.9% FLUSH
10.0000 mL | Freq: Once | INTRAVENOUS | Status: AC
  Administered 2023-07-19: 10 mL

## 2023-07-19 MED ORDER — HEPARIN SOD (PORK) LOCK FLUSH 100 UNIT/ML IV SOLN
500.0000 [IU] | Freq: Once | INTRAVENOUS | Status: AC
  Administered 2023-07-19: 500 [IU]

## 2023-07-19 NOTE — Assessment & Plan Note (Addendum)
 Left breast Multifocal Metaplastic cancer, stage IIIC [id number], including one unresectable axillary node, metaplastic carcinoma, triple negative, grade 3 -diagnosed in 10/2019. CT CAP and bone scan 11/2019 were negative for distant metastasis.  -S/p left mastectomy with Dr Carolynne Edouard on 12/26/19. Surgical path showed: 6cm multifocal metaplastic carcinoma; 13/16 positive LN, one of which was not able to be removed due to vascular invasion.  -s/p adjuvant carbo/taxol 02/05/20 - 05/13/20 followed by Adriamycin/cytoxan 05/20/20 - 07/13/20 along with [place] Sep through 01/19/21.  She tolerated AC poorly with mucositis, weight loss, constipation, and weakness.  -interim left axillary [place] 06/17/20 is negative for adenopathy, c/w response to adjuvant chemo -s/p adjuvant radiation with Dr. Basilio Cairo 08/24/20 - 10/05/20 -restaging CT chest on 12/07/2022 showed no evidence of cancer recurrence, except for surgical and postradiation changes. -she will keep her port for now. She will continue to return for flush every 6-8 weeks. -Circulating tumor DNA Signatera was negative in February 2025, will continue blood test every 6 months.

## 2023-07-19 NOTE — Progress Notes (Signed)
 [facility]   [phone]) [phone] [phone]) [phone]   Clinic Follow up Note   Patient Care Team: Pcp, No as PCP - General Lindsay Proud, RN as Oncology Nurse Navigator Lindsay Angelica, RN as Oncology Nurse Navigator Lindsay Miner, MD as Consulting Physician (General Surgery) Lindsay Mood, MD as Consulting Physician (Hematology) Lindsay Peak, MD as Attending Physician (Radiation Oncology) Lindsay Samples, NP as Nurse Practitioner (Nurse Practitioner)  Date of Service:  07/19/2023  CHIEF COMPLAINT: f/u of breast cancer  CURRENT THERAPY:  Cancer surveillance  Oncology History   Malignant neoplasm of upper-outer quadrant of left breast in female, estrogen receptor negative (HCC)  Left breast Multifocal Metaplastic cancer, stage IIIC pT3N3aM0, including one unresectable axillary node, metaplastic carcinoma, triple negative, grade 3 -diagnosed in 10/2019. CT CAP and bone scan 11/2019 were negative for distant metastasis.  -S/p left mastectomy with Dr Lindsay [name] on 12/26/19. Surgical path showed: 6cm multifocal metaplastic carcinoma; 13/16 positive LN, one of which was not able to be removed due to vascular invasion.  -s/p adjuvant carbo/taxol 02/05/20 - 05/13/20 followed by Adriamycin/cytoxan 05/20/20 - 07/13/20 along with Lindsay [name] through 01/19/21.  She tolerated AC poorly with mucositis, weight loss, constipation, and weakness.  -interim left axillary [place] 06/17/20 is negative for adenopathy, c/w response to adjuvant chemo -s/p adjuvant radiation with Dr. Basilio [name] 08/24/20 - 10/05/20 -restaging CT chest on 12/07/2022 showed no evidence of cancer recurrence, except for surgical and postradiation changes. -she will keep her port for now. She will continue to return for flush every 6-8 weeks. -Circulating tumor DNA Signatera was negative in February 2025, will continue blood test every 6 months.    Assessment and Plan    Breast cancer She reports a 50% improvement in range of  motion following physical therapy, including dry needling. She experiences arm pain and stiffness, particularly after sleeping, but manages it with exercises. Her Signatera test was negative, indicating no detectable cancer cells. She is aware of the recurrence risk and the need for biannual Signatera tests. She has not consulted a surgeon post-surgery and prefers to avoid it unless necessary. - Continue physical therapy as needed. - Schedule Signatera test every six months to monitor for cancer recurrence. - Order annual screening mammogram, preferably in August. - Coordinate Signatera test with port flush every eight weeks.  Pulmonary function concerns She completed a pulmonary function test but has not reviewed the results. She found the test challenging and awaits interpretation from her pulmonologist. - Follow up with pulmonologist to discuss pulmonary function test results.     Plan -She is clinically doing well, exam was unremarkable, recent Signatera was negative. -Continue cancer surveillance, and follow-up in 6 months.  Will obtain lab including Signatera 2 weeks before her next office visit. -Port flush every 2 months. -He is overdue for mammogram, I ordered for her today.  She will call breast center to schedule.    SUMMARY OF ONCOLOGIC HISTORY: Oncology History Overview Note  Cancer Staging Malignant neoplasm of upper-outer quadrant of left breast in female, estrogen receptor negative (HCC) Staging form: Breast, AJCC 8th Edition - Clinical stage from 10/28/2019: Stage IIIB (cT2, cN1, cM0, G3, ER-, PR-, HER2-) - Signed by Lindsay Peak, MD on 11/05/2019 - Pathologic stage from 12/26/2019: Stage IIIC (pT3, pN3a, cM0, G3, ER-, PR-, HER2-) - Signed by Lindsay Mood, MD on 01/28/2020    Malignant neoplasm of upper-outer quadrant of left breast in female, estrogen receptor negative (HCC)  10/27/2019 Mammogram   IMPRESSION:  1. Right breast calcifications spanning 2.6 cm in the upper  slightly medial breast are indeterminate.   2. Left breast dominant mass at 3 o'clock, 6 cm from nipple measuring 3x3.9x2.9 cm is highly suspicious. There is overlying skin thickening, skin involvement is not excluded.   3. Left breast mass/distorted tissue at 1 o'clock, 4cm from nipple measuring 3.0x1.2x2.9 cm is likely in contiguity with the dominant mass at 3 o'clock and is also highly suspicious. With the dominant lesion the overall span a disease is approximately 6 cm.   4. Left breast distortion at 2 o'clock 10 cm from the nipple is Suspicious, measuring 1.0 x 0.8 cm.   5.  Abnormal left axillary lymph nodes (2). Cortical thickness measures up to 0.6 cm.    10/28/2019 Initial Biopsy   Diagnosis 1. Breast, left, needle core biopsy, 3 o'clock, 5cmfn - INVASIVE MAMMARY CARCINOMA. 2. Breast, left, needle core biopsy, 2 o'clock, 10cmfn - INVASIVE MAMMARY CARCINOMA. 3. 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 Microscopic Comment 1. -3. Overall, immunohistochemistry favors a pronounced desmoplastic response, but metaplastic carcinoma cannot be excluded (Epithelioid component: CKAE1AE3 strong +, CK5/6 weak +). The greatest linear extent of tumor in any one core in specimen 1 is 14 mm. The greatest linear extent of tumor in any one core in specimen 2 is 16 mm.   10/28/2019 Receptors her2   3. PROGNOSTIC INDICATORS Results: IMMUNOHISTOCHEMICAL AND MORPHOMETRIC ANALYSIS PERFORMED MANUALLY The tumor cells are EQUIVOCAL for Her2 (2+). Her2 by FISH will be performed and results reported separately. Estrogen Receptor: 0%, NEGATIVE Progesterone Receptor: 0%, NEGATIVE Proliferation Marker Ki67: 10%    3. FLUORESCENCE IN-SITU HYBRIDIZATION Results: GROUP 5: HER2 **NEGATIVE** Equivocal form of amplification of the HER2 gene was detected in the IHC 2+ tissue sample received from this individual. HER2 FISH was performed by a  technologist and cell imaging and analysis on the [employer].   10/28/2019 Cancer Staging   Staging form: Breast, AJCC 8th Edition - Clinical stage from 10/28/2019: Stage IIIB (cT2, cN1, cM0, G3, ER-, PR-, HER2-) - Signed by Lindsay Peak, MD on 11/05/2019   10/31/2019 Initial Diagnosis   Malignant neoplasm of upper-outer quadrant of left breast in female, estrogen receptor negative (HCC)   10/31/2019 Pathology Results   Diagnosis Breast, right, needle core biopsy, UOQ, posterior - FOCAL USUAL DUCTAL HYPERPLASIA AND FIBROCYSTIC CHANGES WITH CALCIFICATIONS - FIBROADENOMATOID CHANGES - NO MALIGNANCY IDENTIFIED Microscopic Comment These results were called to [facility] on November 03, 2019.   11/05/2019 Genetic Testing   She declined Genetic testing    11/21/2019 Imaging   CT CAP w contrast  IMPRESSION: 1. Left breast mass and prominent left axillary lymph nodes, containing biopsy marking clips, consistent with newly diagnosed breast malignancy. 2. No definite evidence of distant metastatic disease in the chest, abdomen, or pelvis. 3. There are occasional small pulmonary nodules, measuring 2-3 mm, most likely incidental sequelae of prior infection or inflammation although metastatic disease is not excluded. Attention on follow-up. 4. Hepatic steatosis. 5. Aortic Atherosclerosis (ICD10-I70.0).     11/21/2019 Imaging   Bone Scan Whole Body IMPRESSION: 1. Single focus of uptake associated with a subacute fracture of LEFT anterior fourth rib. 2. No additional areas of abnormal uptake.   12/26/2019 Cancer Staging   Staging form: Breast, AJCC 8th Edition - Pathologic stage from 12/26/2019: Stage IIIC (pT3, pN3a, cM0, G3, ER-, PR-, HER2-) - Signed by Lindsay Mood, MD on 01/28/2020  12/26/2019 Surgery   LEFT MASTECTOMY MODIFIED RADICAL and PAC Placement by Dr Lindsay [name]   12/26/2019 Pathology Results   FINAL MICROSCOPIC DIAGNOSIS:   A. BREAST, LEFT, MODIFIED RADICAL MASTECTOMY:   - Metaplastic carcinoma, multifocal, 6 cm in greatest dimension,  Not[place] grade 3 of 3.  - Ductal carcinoma in situ, high nuclear grade with central necrosis.  - Margins of resection:  - Metaplastic carcinoma focally involves the anterior margin and is < 1  mm from the posterior margin.  - DCIS is < 1 mm from the anterior margin.  - Metastatic carcinoma in (13) of (16) lymph nodes with extranodal  extension.  - Biopsy clip sites in breast and one lymph node.  - See oncology table.    ADDENDUM:  PROGNOSTIC INDICATOR RESULTS:  Immunohistochemical and morphometric analysis performed manually  The tumor cells are EQUIVOCAL for Her2 (2+). Her2 FISH has been ordered  and will be reported in an addendum.  Estrogen Receptor:       NEGATIVE  Progesterone Receptor:   NEGATIVE  Proliferation Marker Ki-67:   30%    ADDENDUM:  FLOURESCENCE IN-SITU HYBRIDIZATION RESULTS:  GROUP 5:   HER2 NEGATIVE   01/28/2020 Echocardiogram   Baseline Echo  IMPRESSIONS     1. Left ventricular ejection fraction, by estimation, is 60 to 65%. The  left ventricle has normal function. The left ventricle has no regional  wall motion abnormalities. Left ventricular diastolic parameters are  consistent with Grade I diastolic  dysfunction (impaired relaxation). The average left ventricular global  longitudinal strain is -19.8 %.   2. Right ventricular systolic function is normal. The right ventricular  size is normal. Tricuspid regurgitation signal is inadequate for assessing  PA pressure.   3. The mitral valve is normal in structure. No evidence of mitral valve  regurgitation. No evidence of mitral stenosis.   4. The aortic valve is normal in structure. Aortic valve regurgitation is  not visualized. No aortic stenosis is present.   5. The inferior vena cava is normal in size with greater than 50%  respiratory variability, suggesting right atrial pressure of 3 mmHg.    02/05/2020 - 01/19/2021 Chemotherapy    Keytruda q3weeks (to be taken for 1 whole year) with weekly Carboplatin/Taxol for 12 weeks starting 02/05/20-05/13/20 followed by Adriamycin/Cytoxan q2weeks X4 starting 05/20/20-07/13/20.  ----Continue Keytruda q3weeks from 08/03/20 to complete 1 year of treatment from 02/05/20).    06/17/2020 Breast US   FINDINGS: On physical exam,well-healed scars of LEFT mastectomy and LEFT axillary node dissection. I palpate no axillary mass. Ultrasound is performed, showing normal appearing LEFT axillary contents. No mass or enlarged lymph nodes. IMPRESSION: Ultrasound is negative for LEFT axillary adenopathy.   08/24/2020 - 10/05/2020 Radiation Therapy   Adjuvant RT per Dr. Basilio [name] starting 08/24/20-10/05/20   04/14/2021 Survivorship   SCP delivered by Santiago Glad, NP      Discussed the use of AI scribe software for clinical note transcription with the patient, who gave verbal consent to proceed.  History of Present Illness   Lindsay [name], a breast cancer survivor, presents for a follow-up visit. She reports feeling tired but is pushing herself to continue with her physical therapy, which she has been attending for the past two months. She has been receiving dry needling treatment for numbness in her arm, which has improved her range of motion by approximately fifty percent. She plans to finish her therapy this month and then assess her ability to manage on her own.  Lindsay [name] recently  had a breathing test with a pulmonologist, but she has not yet discussed the results with the specialist. She found some of the tasks during the test challenging to perform. She also mentions a family connection to a pulmonologist, Dr. Levy Pupa.  She continues to experience some pain in her arm, particularly if she does not move it overnight, describing it as feeling "frozen" in the morning. She has been doing exercises to prevent it from "locking up". She also mentions a long-standing lump on her body, which has not changed in  size over the past thirty years.  Lindsay [name] is also dealing with the emotional impact of her cancer diagnosis, expressing a degree of superstition and concern about the potential for her cancer to return. She mentions that she has not seen a surgeon since her surgery almost four years ago.         All other systems were reviewed with the patient and are negative.  MEDICAL HISTORY:  Past Medical History:  Diagnosis Date   Breast CA (HCC)    Bronchitis    Family history of bone cancer    Family history of breast cancer    Family history of prostate cancer    Fibromyalgia    HCV (hepatitis C virus)    MRSA (methicillin resistant Staphylococcus aureus) 2012   Shingles     SURGICAL HISTORY: Past Surgical History:  Procedure Laterality Date   MASTECTOMY MODIFIED RADICAL Left 12/26/2019   Procedure: LEFT MASTECTOMY MODIFIED RADICAL;  Surgeon: Lindsay Miner, MD;  Location: [facility] OR;  Service: General;  Laterality: Left;  PEC BLOCK   PORTACATH PLACEMENT Right 12/26/2019   Procedure: INSERTION PORT-A-CATH WITH ULTRASOUND GUIDANCE;  Surgeon: Lindsay Miner, MD;  Location: [facility] OR;  Service: General;  Laterality: Right;    I have reviewed the social history and family history with the patient and they are unchanged from previous note.  ALLERGIES:  is allergic to codeine.  MEDICATIONS:  Current Outpatient Medications  Medication Sig Dispense Refill   acetaminophen (TYLENOL) 650 MG CR tablet Take 650 mg by mouth every 8 (eight) hours as needed for pain.      ascorbic acid (VITAMIN C) 250 MG CHEW Chew by mouth.     benzonatate (TESSALON) 200 MG capsule Take 1 capsule (200 mg total) by mouth 3 (three) times daily as needed for cough. 20 capsule 0   lidocaine (XYLOCAINE) 2 % solution Use as directed 15 mLs in the mouth or throat every 4 (four) hours as needed for mouth pain. 100 mL 2   lidocaine-prilocaine (EMLA) cream Apply 1 Application topically as needed. 30 g 2   methocarbamol (ROBAXIN) 750  MG tablet Take 1 tablet (750 mg total) by mouth 4 (four) times daily as needed (use for muscle cramps/pain). 30 tablet 2   omeprazole (PRILOSEC) 20 MG capsule Take 1 capsule (20 mg total) by mouth daily. 30 capsule 2   valACYclovir (VALTREX) 1000 MG tablet Take 1 tablet (1,000 mg total) by mouth 2 (two) times daily. 14 tablet 1   dexamethasone (DECADRON) 0.5 MG/5ML solution Take 10 ml (1 mg) swish for 2 minutes and then spit.  Do this four times daily as needed Do Not eat or drink for 1 hour after. (Patient not taking: Reported on 07/19/2023) 240 mL 0   diphenoxylate-atropine (LOMOTIL) 2.5-0.025 MG tablet Take 1-2 tablets by mouth 4 (four) times daily as needed for diarrhea or loose stools. (Patient not taking: Reported on 07/19/2023) 30 tablet 1  doxycycline (VIBRA-TABS) 100 MG tablet Take 1 tablet (100 mg total) by mouth 2 (two) times daily. (Patient not taking: Reported on 07/19/2023) 14 tablet 0   gabapentin (NEURONTIN) 300 MG capsule Take 300 mg by mouth 3 (three) times daily as needed. (Patient not taking: Reported on 07/19/2023)     levofloxacin (LEVAQUIN) 500 MG tablet Take 1 tablet (500 mg total) by mouth daily. (Patient not taking: Reported on 07/19/2023) 7 tablet 0   mucosal barrier oral (GELCLAIR) GEL Take 1 packet by mouth as needed. (Patient not taking: Reported on 07/19/2023)     No current facility-administered medications for this visit.    PHYSICAL EXAMINATION: ECOG PERFORMANCE STATUS: 1 - Symptomatic but completely ambulatory  Vitals:   07/19/23 1033  BP: 126/78  Pulse: 71  Resp: 20  Temp: (!) 97.5 F (36.4 C)  SpO2: 96%   Wt Readings from Last 3 Encounters:  07/19/23 175 lb 6.4 oz (79.6 kg)  07/05/23 178 lb 6.4 oz (80.9 kg)  03/29/23 173 lb 3.2 oz (78.6 kg)     GENERAL:alert, no distress and comfortable SKIN: skin color, texture, turgor are normal, no rashes or significant lesions EYES: normal, Conjunctiva are pink and non-injected, sclera clear NECK: supple, thyroid  normal size, non-tender, without nodularity LYMPH:  no palpable lymphadenopathy in the cervical, axillary  LUNGS: clear to auscultation and percussion with normal breathing effort HEART: regular rate & rhythm and no murmurs and no lower extremity edema ABDOMEN:abdomen soft, non-tender and normal bowel sounds Musculoskeletal:no cyanosis of digits and no clubbing  NEURO: alert & oriented x 3 with fluent speech, no focal motor/sensory deficits Breast exam showed status post left mastectomy, no palpable nodules on chest wall or skin change.  Right breast exam was benign, no palpable adenopathy.  LABORATORY DATA:  I have reviewed the data as listed    Latest Ref Rng & Units 07/19/2023   10:14 AM 05/29/2023   10:19 AM 03/29/2023    9:54 AM  CBC  WBC 4.0 - 10.5 K/uL 6.0  6.5  5.2   Hemoglobin 12.0 - 15.0 g/dL 16.1  09.6  04.5   Hematocrit 36.0 - 46.0 % 40.5  38.9  37.3   Platelets 150 - 400 K/uL 150  158  168         Latest Ref Rng & Units 07/19/2023   10:14 AM 05/29/2023   10:19 AM 03/29/2023    9:54 AM  CMP  Glucose 70 - 99 mg/dL 409  811  914   BUN 8 - 23 mg/dL 15  13  16    Creatinine 0.44 - 1.00 mg/dL 7.82  9.56  2.13   Sodium 135 - 145 mmol/L 136  139  135   Potassium 3.5 - 5.1 mmol/L 4.1  3.6  3.7   Chloride 98 - 111 mmol/L 104  104  104   CO2 22 - 32 mmol/L 29  30  27    Calcium 8.9 - 10.3 mg/dL 8.8  9.3  8.9   Total Protein 6.5 - 8.1 g/dL 6.3  6.1  6.0   Total Bilirubin 0.0 - 1.2 mg/dL 0.7  0.6  0.5   Alkaline Phos 38 - 126 U/L 63  70  64   AST 15 - 41 U/L 31  37  29   ALT 0 - 44 U/L 13  14  13        RADIOGRAPHIC STUDIES: I have personally reviewed the radiological images as listed and agreed with the findings in  the report. No results found.    Orders Placed This Encounter  Procedures   MM Digital Screening Unilat R    Standing Status:   Future    Expected Date:   08/19/2023    Expiration Date:   07/18/2024    Reason for Exam (SYMPTOM  OR DIAGNOSIS REQUIRED):    screening    Preferred [facility] location?:   [facility]   All questions were answered. The patient knows to call the clinic with any problems, questions or concerns. No barriers to learning was detected. The total time spent in the appointment was 25 minutes.     Lindsay Mood, MD 07/19/2023

## 2023-07-19 NOTE — Therapy (Signed)
 OUTPATIENT PHYSICAL THERAPY  UPPER EXTREMITY ONCOLOGY TREATMENT   Patient Name: Lindsay [name] MRN: [id number] DOB:1955-12-26, 68 y.o., female Today's Date: 07/19/2023  END OF SESSION:  PT End of Session - 07/19/23 1211     Visit Number 16    Number of Visits 20    Date for PT Re-Evaluation 08/02/23    Authorization Type Medicare B    PT Start Time 1205    PT Stop Time 1300    PT Time Calculation (min) 55 min    Activity Tolerance Patient tolerated treatment well    Behavior During Therapy WFL for tasks assessed/performed                 Past Medical History:  Diagnosis Date   Breast CA (HCC)    Bronchitis    Family history of bone cancer    Family history of breast cancer    Family history of prostate cancer    Fibromyalgia    HCV (hepatitis C virus)    MRSA (methicillin resistant Staphylococcus aureus) 2012   Shingles    Past Surgical History:  Procedure Laterality Date   MASTECTOMY MODIFIED RADICAL Left 12/26/2019   Procedure: LEFT MASTECTOMY MODIFIED RADICAL;  Surgeon: Griselda Miner, MD;  Location: [facility] OR;  Service: General;  Laterality: Left;  PEC BLOCK   PORTACATH PLACEMENT Right 12/26/2019   Procedure: INSERTION PORT-A-CATH WITH ULTRASOUND GUIDANCE;  Surgeon: Griselda Miner, MD;  Location: [facility] OR;  Service: General;  Laterality: Right;   Patient Active Problem List   Diagnosis Date Noted   Chronic cough 07/05/2023   Abnormal CT of the chest 07/05/2023   Chemotherapy induced neutropenia (HCC) 03/31/2020   Port-A-Cath in place 02/04/2020   Family history of breast cancer    Family history of prostate cancer    Family history of bone cancer    Malignant neoplasm of upper-outer quadrant of left breast in female, estrogen receptor negative (HCC) 10/31/2019      REFERRING PROVIDER: Malachy Mood, MD  REFERRING DIAG: Left arm/shoulder pain after surgery  THERAPY DIAG:  Stiffness of left shoulder, not elsewhere classified  Left shoulder pain,  unspecified chronicity  Aftercare following surgery for neoplasm  Malignant neoplasm of upper-outer quadrant of left breast in female, estrogen receptor negative (HCC)  Secondary lymphedema  ONSET DATE: 9 months  Rationale for Evaluation and Treatment: Rehabilitation  SUBJECTIVE:  SUBJECTIVE I am feeling a whole lot better since the dry needling. I feel like I've made more progress over the past 2 weeks or so.   PERTINENT HISTORY: . 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 Patient underwent a left modified radical mastectomy on 12/26/2019 with 13 of 16 were positive for cancer. The Ki67 is 10%. There may be some cancer attaching the LN to the visceral vein but no new diagnositics have been done.   PAIN:  Are you having pain? No, not currently  PRECAUTIONS: Left UE lymphedema risk  RED FLAGS: None   WEIGHT BEARING RESTRICTIONS: No  FALLS:  Has patient fallen in last 6 months? Yes. 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  COGNITION: Overall cognitive status: Within functional limits for tasks assessed   PALPATION: Tight in left pectorals especially axillary border with tenderness, several cords palpable in axillary region, but not easily visible  OBSERVATIONS / OTHER ASSESSMENTS: Wearing lymphdivas 30-40 sleeve, left UT compensation with reaching activities  SENSATION: Light touch: Deficits    POSTURE: forward  head, rounded shoulders  UPPER EXTREMITY AROM/PROM:  A/PROM RIGHT   eval   Shoulder extension 60  Shoulder flexion 154  Shoulder abduction 165  Shoulder internal rotation 78  Shoulder external rotation 100    (Blank rows = not tested)  A/PROM LEFT   eval LEFT 05/29/2023 LEFT 06/07/2023 Left 06/26/23 LEFT 07/12/23  Shoulder extension 48 pain 54 50 63 60  Shoulder flexion 132 Pain 142 151 156 159  Shoulder abduction 70 Pain 127 145 149 138 with pain  Shoulder internal rotation       Shoulder external rotation         (Blank rows = not tested)  CERVICAL AROM: All within functional limits:      UPPER EXTREMITY STRENGTH:   LYMPHEDEMA ASSESSMENTS:   SURGERY TYPE/DATE: left modified radical mastectomy on 12/26/2019   NUMBER OF LYMPH NODES REMOVED: 13 of 16 were positive for cancer  CHEMOTHERAPY: YES  RADIATION:YES 08/24/20-10/05/20  HORMONE TREATMENT: NO  INFECTIONS:    LYMPHEDEMA ASSESSMENTS:   LANDMARK RIGHT  eval  At axilla  34.5  15 cm proximal to olecranon process   10 cm proximal to olecranon process 31.9  Olecranon process 25.5  15 cm proximal to ulnar styloid process   10 cm proximal to ulnar styloid process 22.8  Just proximal to ulnar styloid process 16.1  Across hand at thumb web space 19.6  At base of 2nd digit 6.7  (Blank rows = not tested)  LANDMARK LEFT  eval  At axilla    15 cm proximal to olecranon process   10 cm proximal to olecranon process 32.5  Olecranon process 24.4  15 cm proximal to ulnar styloid process   10 cm proximal to ulnar styloid process 21.5  Just proximal to ulnar styloid process 16.1  Across hand at thumb web space 19.3  At base of 2nd digit 6.6  (Blank rows = not tested)    GAIT:WNL   L-DEX LYMPHEDEMA SCREENING: The patient was assessed using the L-Dex machine today to produce a lymphedema index baseline score. The patient will  be reassessed on a regular basis (typically every 3 months) to obtain new L-Dex  scores. If the score is > 6.5 points away from his/her baseline score indicating onset of subclinical lymphedema, it will be recommended to wear a compression garment for 4 weeks, 12 hours per day and then be reassessed. If the score continues to be > 6.5 points from baseline at reassessment, we will initiate lymphedema treatment. Assessing in this manner has a 95% rate of preventing clinically significant lymphedema.   QUICK DASH SURVEY: 66% eval,  06/07/2023 59%, 06/26/23 47%, 07/05/23 30%                                                                                                                            TREATMENT DATE:  07/19/23: Therapeutic Exercises Pulleys into flex and abd x 2 mins each Roll yellow ball up wall x 15 each into flex and Lt UE abd Neuro Re Ed Bil UE 3 way raises with back against wall/core engaged, and shoulders against wall using 1# flex, scaption and abd x 12 each returning demo and having pt focus on erect posture Free Motion machine bil UE scap retract 3# 2 x 10 with cuing for proper technique and scapular engagement at the end ROM Lt tricep ext 3# x 10, tactile cues for correct UE position and scapular engagement; tactile cuing for correct posture with shoulder ROM as pt was demonstrating side lean Manual Therapy STM with cocoa butter to Lt pect tendon, chest and area inferior to mastectomy scar with UE in various degrees of abduction, also to lateral trunk PROM left shoulder flexion, scaption, and abduction to restore functional ROM and with scapular depression throughout allowing for improved ROM with less discomfort  07/10/2023 Pulleys flexion and abd x 2 min Seated ball rolls: flexion x10 Doorway pec stretch 10" x5 Standing open book stretch at wall x10 bil each   Trigger Point Dry Needling  Subsequent Treatment: Instructions provided previously at initial dry needling treatment.  Patient Verbal Consent Given: Yes Education Handout Provided: Previously  Provided Muscles Treated: Lt pec major and minor, Lt lats  Electrical Stimulation Performed: No Treatment Response/Outcome: twitch response and improved tissue mobility  Elongation and release to Lt shoulder and pec and lats after dry needling   07/12/23 Pulleys x 2 min in direction of flexion and 2 min in direction of abduction with v/c and t/c for correct UE positioning throughout Supine scap exercises on large mat table with yellow band x 10 reps each in to narrow and wide grip, horizontal abduction, diagonals with pt returning therapist demo for each Ball up wall x 10 reps in direction of flexion and 10 in L abduction with v/c and t/c for form IASTM with cocoa butter and boomerang tool and WAVE to L pec and L lateral trunk with pt reporting decreased tightness at end of session and improved mobility/ Pec has increased fascial adhesions  07/10/2023 Pulleys flexion and abd x 2  min Ball rolls on wall x 10 forward, 5 abd Doorway pec stretch 10" x5 Open book to stretch Lt pec x10 Trigger Point Dry Needling  Initial Treatment: Pt instructed on Dry Needling rational, procedures, and possible side effects. Pt instructed to expect mild to moderate muscle soreness later in the day and/or into the next day.  Pt instructed in methods to reduce muscle soreness. Pt instructed to continue prescribed HEP. Because Dry Needling was performed over or adjacent to a lung field, pt was educated on S/S of pneumothorax and to seek immediate medical attention should they occur.  Patient was educated on signs and symptoms of infection and other risk factors and advised to seek medical attention should they occur.  Patient verbalized understanding of these instructions and education.   Patient Verbal Consent Given: Yes Education Handout Provided: Yes Muscles Treated: Lt pec major and minor, Lt lats  Treatment Response/Outcome: twitch response and improved tissue mobility  Elongation and release to Lt shoulder  and pec and lats after dry needling   07/05/2023 Pulleys flexion and abd x 2 min Ball rolls on wall x 10 forward, 5 abd Supine horizontal abd Left only with PT holding band x 10 yellow Supine scaption with yellow x 10 MFR left chest region STM left pectorals in stargazer position with cocoabutter PROM left shoulder flexion, scaption, and abduction to restore functional ROM  07/03/23: Therapeutic Exercises Pulleys x 2 min flex and abduction, VC's to remind pt of correct technique Supine on mat table with pillow turned long wise for following: Bil UE horz abd and then bil UE scaption into a "V" x 15 each x 5 sec holds, bil UE flexion  [employer] with pt returning therapist demo x 10 reps Manual Therapy STM with cocoa butter to Lt pect tendon, chest and area inferior to mastectomy scar with UE in various degrees of abduction then switched to IASTM using wave tool PROM left shoulder flexion, scaption, and abduction to restore functional ROM      PATIENT EDUCATION:  Education details: Standing and supine dowel exercises Person educated: Patient Education method: Explanation, Demonstration, and Handouts, verbal and tactile cues Education comprehension: verbalized understanding and returned demonstration, and will benefit from further review  HOME EXERCISE PROGRAM: 4 post op exercises Dowel exercises in standing and supine Supine scap series  ASSESSMENT:  CLINICAL IMPRESSION: Pt feels she has made new gains in past 2 or so weeks. She feels dry needling has really helped to increase her A/ROM since starting this. Today focused on continuing to improe Lt shoulder A/ROM with decreasing compensations. Also worked on improving Lt shoulder strength with incorporating correct posture.    OBJECTIVE IMPAIRMENTS: decreased activity tolerance, decreased knowledge of condition, decreased ROM, decreased strength, hypomobility, increased fascial restrictions, impaired flexibility, impaired UE  functional use, postural dysfunction, and pain.   ACTIVITY LIMITATIONS: lifting, reach over head, and hygiene/grooming  PARTICIPATION LIMITATIONS: driving, occupation, and anything requiring overhead reaching  PERSONAL FACTORS: 3+ comorbidities: Triple Negative breast Cancer s/p chemo and radiation  are also affecting patient's functional outcome.   REHAB POTENTIAL: Good  CLINICAL DECISION MAKING: Stable/uncomplicated  EVALUATION COMPLEXITY: Low  GOALS: Goals reviewed with patient? Yes  SHORT TERM GOALS=LONG TERM GOALS: Target date: 06/07/2023  Pt will be independent with a HEP to improve left shoulder ROM and strength Baseline: Goal status: MET 05/29/2023 2.  Pt will improve left shoulder flexion to atleast 145 degrees for improved reaching Baseline: 132 Goal status: MET 06/07/2023  3.  Pt  will improve left shoulder abd to atleast 130 degrees   with 0-min UT compensation Baseline: 70 Goal status: MET 06/07/2023  4.  Left UE pain will be decreased by 50% or greater Baseline: 06/26/23: 40% improvement reported at this time Goal status: In Progress  5.  Quick dash will be improved to no greater than 30% to demonstrate improved function Baseline:  Goal status: MET 30% 07/05/2023  6. Able to pull down seat belt with left hand and put coat on with minimal pain  Baseline: 06/26/23:  Goal Status: In Progress: able to put on coat better, but still has difficulty with seat belt  PLAN:  PT FREQUENCY: 2x/week  PT DURATION: 4 weeks  PLANNED INTERVENTIONS: 97164- PT Re-evaluation, 97110-Therapeutic exercises, 97530- Therapeutic activity, 97112- Neuromuscular re-education, 97535- Self Care, 16109- Manual therapy, 97760- Orthotic Fit/training, Patient/Family education, Balance training, Dry Needling, and Joint mobilization  PLAN FOR NEXT SESSION: Cont postural strength and STM prn to pecs/lateral trunk,  assess response to dry needling , how are supine scap?  Berna Spare,  PTA 07/19/23 1:10 PM

## 2023-07-24 ENCOUNTER — Ambulatory Visit: Payer: Medicare Other

## 2023-07-24 DIAGNOSIS — M25512 Pain in left shoulder: Secondary | ICD-10-CM

## 2023-07-24 DIAGNOSIS — M25612 Stiffness of left shoulder, not elsewhere classified: Secondary | ICD-10-CM

## 2023-07-24 DIAGNOSIS — Z483 Aftercare following surgery for neoplasm: Secondary | ICD-10-CM

## 2023-07-24 NOTE — Therapy (Signed)
 OUTPATIENT PHYSICAL THERAPY  UPPER EXTREMITY ONCOLOGY TREATMENT   Patient Name: Lindsay [name] MRN: [id number] DOB:01-09-1956, 68 y.o., female Today's Date: 07/24/2023  END OF SESSION:  PT End of Session - 07/24/23 1143     Visit Number 17    Date for PT Re-Evaluation 08/02/23    Authorization Type Medicare B    PT Start Time 1102    PT Stop Time 1143    PT Time Calculation (min) 41 min    Activity Tolerance Patient tolerated treatment well    Behavior During Therapy WFL for tasks assessed/performed                  Past Medical History:  Diagnosis Date   Breast CA (HCC)    Bronchitis    Family history of bone cancer    Family history of breast cancer    Family history of prostate cancer    Fibromyalgia    HCV (hepatitis C virus)    MRSA (methicillin resistant Staphylococcus aureus) 2012   Shingles    Past Surgical History:  Procedure Laterality Date   MASTECTOMY MODIFIED RADICAL Left 12/26/2019   Procedure: LEFT MASTECTOMY MODIFIED RADICAL;  Surgeon: Griselda Miner, MD;  Location: [facility] OR;  Service: General;  Laterality: Left;  PEC BLOCK   PORTACATH PLACEMENT Right 12/26/2019   Procedure: INSERTION PORT-A-CATH WITH ULTRASOUND GUIDANCE;  Surgeon: Griselda Miner, MD;  Location: [facility] OR;  Service: General;  Laterality: Right;   Patient Active Problem List   Diagnosis Date Noted   Chronic cough 07/05/2023   Abnormal CT of the chest 07/05/2023   Chemotherapy induced neutropenia (HCC) 03/31/2020   Port-A-Cath in place 02/04/2020   Family history of breast cancer    Family history of prostate cancer    Family history of bone cancer    Malignant neoplasm of upper-outer quadrant of left breast in female, estrogen receptor negative (HCC) 10/31/2019      REFERRING PROVIDER: Malachy Mood, MD  REFERRING DIAG: Left arm/shoulder pain after surgery  THERAPY DIAG:  Stiffness of left shoulder, not elsewhere classified  Left shoulder pain, unspecified  chronicity  Aftercare following surgery for neoplasm  ONSET DATE: 9 months  Rationale for Evaluation and Treatment: Rehabilitation  SUBJECTIVE:                                                                                                                                                                                           SUBJECTIVE I feel looser for 2-3 days after dry needling.  I have more sensation in my Lt chest noe.   PERTINENT  HISTORY: . 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 Patient underwent a left modified radical mastectomy on 12/26/2019 with 13 of 16 were positive for cancer. The Ki67 is 10%. There may be some cancer attaching the LN to the visceral vein but no new diagnositics have been done.   PAIN:  Are you having pain? No, not currently  PRECAUTIONS: Left UE lymphedema risk  RED FLAGS: None   WEIGHT BEARING RESTRICTIONS: No  FALLS:  Has patient fallen in last 6 months? Yes. 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  COGNITION: Overall cognitive status: Within functional limits for tasks assessed   PALPATION: Tight in left pectorals especially axillary border with tenderness, several cords palpable in axillary region, but not easily visible  OBSERVATIONS / OTHER ASSESSMENTS: Wearing lymphdivas 30-40 sleeve, left UT compensation with reaching activities  SENSATION: Light touch: Deficits    POSTURE: forward head, rounded shoulders  UPPER EXTREMITY AROM/PROM:  A/PROM RIGHT   eval   Shoulder extension 60  Shoulder flexion 154  Shoulder abduction 165  Shoulder internal  rotation 78  Shoulder external rotation 100    (Blank rows = not tested)  A/PROM LEFT   eval LEFT 05/29/2023 LEFT 06/07/2023 Left 06/26/23 LEFT 07/12/23  Shoulder extension 48 pain 54 50 63 60  Shoulder flexion 132 Pain 142 151 156 159  Shoulder abduction 70 Pain 127 145 149 138 with pain  Shoulder internal rotation       Shoulder external rotation         (Blank rows = not tested)  CERVICAL AROM: All within functional limits:      UPPER EXTREMITY STRENGTH:   LYMPHEDEMA ASSESSMENTS:   SURGERY TYPE/DATE: left modified radical mastectomy on 12/26/2019   NUMBER OF LYMPH NODES REMOVED: 13 of 16 were positive for cancer  CHEMOTHERAPY: YES  RADIATION:YES 08/24/20-10/05/20  HORMONE TREATMENT: NO  INFECTIONS:    LYMPHEDEMA ASSESSMENTS:   LANDMARK RIGHT  eval  At axilla  34.5  15 cm proximal to olecranon process   10 cm proximal to olecranon process 31.9  Olecranon process 25.5  15 cm proximal to ulnar styloid process   10 cm proximal to ulnar styloid process 22.8  Just proximal to ulnar styloid process 16.1  Across hand at thumb web space 19.6  At base of 2nd digit 6.7  (Blank rows = not tested)  LANDMARK LEFT  eval  At axilla    15 cm proximal to olecranon process   10 cm proximal to olecranon process 32.5  Olecranon process 24.4  15 cm proximal to ulnar styloid process   10 cm proximal to ulnar styloid process 21.5  Just proximal to ulnar styloid process 16.1  Across hand at thumb web space 19.3  At base of 2nd digit 6.6  (Blank rows = not tested)    GAIT:WNL   L-DEX LYMPHEDEMA SCREENING: The patient was assessed using the L-Dex machine today to produce a lymphedema index baseline score. The patient will be reassessed on a regular basis (typically every 3 months) to obtain new L-Dex scores. If the score is > 6.5 points away from his/her baseline score indicating onset  of subclinical lymphedema, it will be recommended to wear a compression garment for 4  weeks, 12 hours per day and then be reassessed. If the score continues to be > 6.5 points from baseline at reassessment, we will initiate lymphedema treatment. Assessing in this manner has a 95% rate of preventing clinically significant lymphedema.   QUICK DASH SURVEY: 66% eval,  06/07/2023 59%, 06/26/23 47%, 07/05/23 30%                                                                                                                            TREATMENT DATE:   07/24/2023 Pulleys flexion and abd x 2 min Seated ball rolls: flexion x10 Doorway pec stretch 10" x5 Standing open book stretch at wall x10 bil each  Bil UE 3 way raises with back against wall/core engaged, and shoulders against wall using 2# flex, scaption and abd x 10 each- focus on erect posture  Triceps kickbacks: 2#  Trigger Point Dry Needling  Subsequent Treatment: Instructions provided previously at initial dry needling treatment.  Patient Verbal Consent Given: Yes Education Handout Provided: Previously Provided Muscles Treated: Lt pec major and minor, Lt lats  Electrical Stimulation Performed: No Treatment Response/Outcome: twitch response and improved tissue mobility  Elongation and release to Lt shoulder and pec and lats after dry needling  07/19/23: Therapeutic Exercises Pulleys into flex and abd x 2 mins each Roll yellow ball up wall x 15 each into flex and Lt UE abd Neuro Re Ed Bil UE 3 way raises with back against wall/core engaged, and shoulders against wall using 1# flex, scaption and abd x 12 each returning demo and having pt focus on erect posture Free Motion machine bil UE scap retract 3# 2 x 10 with cuing for proper technique and scapular engagement at the end ROM Lt tricep ext 3# x 10, tactile cues for correct UE position and scapular engagement; tactile cuing for correct posture with shoulder ROM as pt was demonstrating side lean Manual Therapy STM with cocoa butter to Lt pect tendon, chest and area inferior  to mastectomy scar with UE in various degrees of abduction, also to lateral trunk PROM left shoulder flexion, scaption, and abduction to restore functional ROM and with scapular depression throughout allowing for improved ROM with less discomfort  07/10/2023 Pulleys flexion and abd x 2 min Seated ball rolls: flexion x10 Doorway pec stretch 10" x5 Standing open book stretch at wall x10 bil each   Trigger Point Dry Needling  Subsequent Treatment: Instructions provided previously at initial dry needling treatment.  Patient Verbal Consent Given: Yes Education Handout Provided: Previously Provided Muscles Treated: Lt pec major and minor, Lt lats  Electrical Stimulation Performed: No Treatment Response/Outcome: twitch response and improved tissue mobility  Elongation and release to Lt shoulder and pec and lats after dry needling   07/12/23 Pulleys x 2 min in direction of flexion and 2 min in direction of abduction with v/c and t/c for correct  UE positioning throughout Supine scap exercises on large mat table with yellow band x 10 reps each in to narrow and wide grip, horizontal abduction, diagonals with pt returning therapist demo for each Ball up wall x 10 reps in direction of flexion and 10 in L abduction with v/c and t/c for form IASTM with cocoa butter and boomerang tool and WAVE to L pec and L lateral trunk with pt reporting decreased tightness at end of session and improved mobility/ Pec has increased fascial adhesions  PATIENT EDUCATION:  Education details: Standing and supine dowel exercises Person educated: Patient Education method: Explanation, Demonstration, and Handouts, verbal and tactile cues Education comprehension: verbalized understanding and returned demonstration, and will benefit from further review  HOME EXERCISE PROGRAM: 4 post op exercises Dowel exercises in standing and supine Supine scap series  ASSESSMENT:  CLINICAL IMPRESSION: Pt reports 40% improvement in Lt  UE flexibility since starting dry needling.  She is working on flexibility and strength to support this treatment and is compliant with this daily. She did well with advancement of weights with standing shoulder raises today.   Pt with good response to dry needling with improved tissue mobility after treatment and PT emphasized importance of flexibility after needling to maximize benefit.   Overall fewer trigger points and reduced tension in the Lt upper anterior quarter in muscles treated.  Also worked on improving Lt shoulder strength with incorporating correct posture.    OBJECTIVE IMPAIRMENTS: decreased activity tolerance, decreased knowledge of condition, decreased ROM, decreased strength, hypomobility, increased fascial restrictions, impaired flexibility, impaired UE functional use, postural dysfunction, and pain.   ACTIVITY LIMITATIONS: lifting, reach over head, and hygiene/grooming  PARTICIPATION LIMITATIONS: driving, occupation, and anything requiring overhead reaching  PERSONAL FACTORS: 3+ comorbidities: Triple Negative breast Cancer s/p chemo and radiation  are also affecting patient's functional outcome.   REHAB POTENTIAL: Good  CLINICAL DECISION MAKING: Stable/uncomplicated  EVALUATION COMPLEXITY: Low  GOALS: Goals reviewed with patient? Yes  SHORT TERM GOALS=LONG TERM GOALS: Target date: 06/07/2023  Pt will be independent with a HEP to improve left shoulder ROM and strength Baseline: Goal status: MET 05/29/2023 2.  Pt will improve left shoulder flexion to atleast 145 degrees for improved reaching Baseline: 132 Goal status: MET 06/07/2023  3.  Pt will improve left shoulder abd to atleast 130 degrees   with 0-min UT compensation Baseline: 70 Goal status: MET 06/07/2023  4.  Left UE pain will be decreased by 50% or greater Baseline: 06/26/23: 40% improvement reported at this time Goal status: In Progress  5.  Quick dash will be improved to no greater than 30% to demonstrate  improved function Baseline:  Goal status: MET 30% 07/05/2023  6. Able to pull down seat belt with left hand and put coat on with minimal pain  Baseline: 06/26/23:  Goal Status: In Progress: able to put on coat better, but still has difficulty with seat belt  PLAN:  PT FREQUENCY: 2x/week  PT DURATION: 4 weeks  PLANNED INTERVENTIONS: 97164- PT Re-evaluation, 97110-Therapeutic exercises, 97530- Therapeutic activity, 97112- Neuromuscular re-education, 97535- Self Care, [id number]- Manual therapy, 97760- Orthotic Fit/training, Patient/Family education, Balance training, Dry Needling, and Joint mobilization  PLAN FOR NEXT SESSION: Cont postural strength and STM prn to pecs/lateral trunk,  assess response to dry needling , how are supine scap?  ERO next to determine if more PT after 08/02/23.  One more needling session is scheduled with this PT.   Lorrene Reid, PT 07/24/23 11:48 AM

## 2023-07-26 ENCOUNTER — Ambulatory Visit: Payer: Medicare Other

## 2023-07-26 DIAGNOSIS — M25612 Stiffness of left shoulder, not elsewhere classified: Secondary | ICD-10-CM

## 2023-07-26 DIAGNOSIS — M25512 Pain in left shoulder: Secondary | ICD-10-CM

## 2023-07-26 DIAGNOSIS — Z171 Estrogen receptor negative status [ER-]: Secondary | ICD-10-CM

## 2023-07-26 DIAGNOSIS — I890 Lymphedema, not elsewhere classified: Secondary | ICD-10-CM

## 2023-07-26 DIAGNOSIS — Z483 Aftercare following surgery for neoplasm: Secondary | ICD-10-CM

## 2023-07-26 NOTE — Therapy (Signed)
 OUTPATIENT PHYSICAL THERAPY  UPPER EXTREMITY ONCOLOGY TREATMENT   Patient Name: Lindsay [name] MRN: [id number] DOB:Feb 03, 1956, 68 y.o., female Today's Date: 07/26/2023  END OF SESSION:  PT End of Session - 07/26/23 1356     Visit Number 18    Number of Visits 20    Date for PT Re-Evaluation 08/02/23    Authorization Type Medicare B    PT Start Time 1400    PT Stop Time 1457    PT Time Calculation (min) 57 min    Activity Tolerance Patient tolerated treatment well    Behavior During Therapy WFL for tasks assessed/performed                  Past Medical History:  Diagnosis Date   Breast CA (HCC)    Bronchitis    Family history of bone cancer    Family history of breast cancer    Family history of prostate cancer    Fibromyalgia    HCV (hepatitis C virus)    MRSA (methicillin resistant Staphylococcus aureus) 2012   Shingles    Past Surgical History:  Procedure Laterality Date   MASTECTOMY MODIFIED RADICAL Left 12/26/2019   Procedure: LEFT MASTECTOMY MODIFIED RADICAL;  Surgeon: Griselda Miner, MD;  Location: [facility] OR;  Service: General;  Laterality: Left;  PEC BLOCK   PORTACATH PLACEMENT Right 12/26/2019   Procedure: INSERTION PORT-A-CATH WITH ULTRASOUND GUIDANCE;  Surgeon: Griselda Miner, MD;  Location: [facility] OR;  Service: General;  Laterality: Right;   Patient Active Problem List   Diagnosis Date Noted   Chronic cough 07/05/2023   Abnormal CT of the chest 07/05/2023   Chemotherapy induced neutropenia (HCC) 03/31/2020   Port-A-Cath in place 02/04/2020   Family history of breast cancer    Family history of prostate cancer    Family history of bone cancer    Malignant neoplasm of upper-outer quadrant of left breast in female, estrogen receptor negative (HCC) 10/31/2019      REFERRING PROVIDER: Malachy Mood, MD  REFERRING DIAG: Left arm/shoulder pain after surgery  THERAPY DIAG:  Stiffness of left shoulder, not elsewhere classified  Left shoulder pain,  unspecified chronicity  Aftercare following surgery for neoplasm  Malignant neoplasm of upper-outer quadrant of left breast in female, estrogen receptor negative (HCC)  Secondary lymphedema  ONSET DATE: 9 months  Rationale for Evaluation and Treatment: Rehabilitation  SUBJECTIVE:  SUBJECTIVE I feel looser for 2-3 days after dry needling.  I do my exercises and I have much more mobility. I don't have much pain except when I pull the seat belt down. Its better for atleast 5 days after I have the needling.  My tractor  has been broken. Everything has improved by atleast 60% to 70% from the beginning. I feel like I could be released.  PERTINENT HISTORY: . 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 Patient underwent a left modified radical mastectomy on 12/26/2019 with 13 of 16 were positive for cancer. The Ki67 is 10%. There may be some cancer attaching the LN to the visceral vein but no new diagnositics have been done.   PAIN:  Are you having pain? No, not currently, but when I reach to extremes it can be 3/10.  PRECAUTIONS: Left UE lymphedema risk  RED FLAGS: None   WEIGHT BEARING RESTRICTIONS: No  FALLS:  Has patient fallen in last 6 months? Yes. 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  COGNITION: Overall cognitive status: Within functional limits for tasks assessed   PALPATION: Tight in left pectorals especially  axillary border with tenderness, several cords palpable in axillary region, but not easily visible  OBSERVATIONS / OTHER ASSESSMENTS: Wearing lymphdivas 30-40 sleeve, left UT compensation with reaching activities  SENSATION: Light touch: Deficits    POSTURE: forward head, rounded shoulders  UPPER EXTREMITY AROM/PROM:  A/PROM RIGHT   eval   Shoulder extension 60  Shoulder flexion 154  Shoulder abduction 165  Shoulder internal rotation 78  Shoulder external rotation 100    (Blank rows = not tested)  A/PROM LEFT   eval LEFT 05/29/2023 LEFT 06/07/2023 Left 06/26/23 LEFT 07/12/23 LEFT 07/26/2023  Shoulder extension 48 pain 54 50 63 60 62  Shoulder flexion 132 Pain 142 151 156 159 159  Shoulder abduction 70 Pain 127 145 149 138 with pain 156  Shoulder internal rotation        Shoulder external rotation          (Blank rows = not tested)  CERVICAL AROM: All within functional limits:      UPPER EXTREMITY STRENGTH:   LYMPHEDEMA ASSESSMENTS:   SURGERY TYPE/DATE: left modified radical mastectomy on 12/26/2019   NUMBER OF LYMPH NODES REMOVED: 13 of 16 were positive for cancer  CHEMOTHERAPY: YES  RADIATION:YES 08/24/20-10/05/20  HORMONE TREATMENT: NO  INFECTIONS:    LYMPHEDEMA ASSESSMENTS:   LANDMARK RIGHT  eval  At axilla  34.5  15 cm proximal to olecranon process   10 cm proximal to olecranon process 31.9  Olecranon process 25.5  15 cm proximal to ulnar styloid process   10 cm proximal to ulnar styloid process 22.8  Just proximal to ulnar styloid process 16.1  Across hand at thumb web space 19.6  At base of 2nd digit 6.7  (Blank rows = not tested)  LANDMARK LEFT  eval  At axilla    15 cm proximal to olecranon process   10 cm proximal to olecranon process 32.5  Olecranon process 24.4  15 cm proximal to ulnar styloid process   10 cm proximal to ulnar  styloid process 21.5  Just proximal to ulnar styloid process 16.1  Across hand at thumb web space 19.3  At  base of 2nd digit 6.6  (Blank rows = not tested)    GAIT:WNL   L-DEX LYMPHEDEMA SCREENING: The patient was assessed using the L-Dex machine today to produce a lymphedema index baseline score. The patient will be reassessed on a regular basis (typically every 3 months) to obtain new L-Dex scores. If the score is > 6.5 points away from his/her baseline score indicating onset of subclinical lymphedema, it will be recommended to wear a compression garment for 4 weeks, 12 hours per day and then be reassessed. If the score continues to be > 6.5 points from baseline at reassessment, we will initiate lymphedema treatment. Assessing in this manner has a 95% rate of preventing clinically significant lymphedema.   QUICK DASH SURVEY: 66% eval,  06/07/2023 59%, 06/26/23 47%, 07/05/23 30%, 07/26/2023 27%                                                                                                                            TREATMENT DATE:  07/26/2023 Discussed progress toward goals, improvements, deficits Overhead pulleys flexion and abduction x 2 min Ball rolls on wall x 10, abd x 5 Measured AROM Scapular retraction, shoulder ext, bilateral ER with red x 10 Jobes Flexion and scaption 2 # x 10 Updated HEP and gave red band Discussed HEP and importance of continuing with stretches daily and strength 2-3 times per week 07/24/2023 Pulleys flexion and abd x 2 min Seated ball rolls: flexion x10 Doorway pec stretch 10" x5 Standing open book stretch at wall x10 bil each  Bil UE 3 way raises with back against wall/core engaged, and shoulders against wall using 2# flex, scaption and abd x 10 each- focus on erect posture  Triceps kickbacks: 2#  Trigger Point Dry Needling  Subsequent Treatment: Instructions provided previously at initial dry needling treatment.  Patient Verbal Consent Given: Yes Education Handout Provided: Previously Provided Muscles Treated: Lt pec major and minor, Lt lats  Electrical  Stimulation Performed: No Treatment Response/Outcome: twitch response and improved tissue mobility  Elongation and release to Lt shoulder and pec and lats after dry needling  07/19/23: Therapeutic Exercises Pulleys into flex and abd x 2 mins each Roll yellow ball up wall x 15 each into flex and Lt UE abd Neuro Re Ed Bil UE 3 way raises with back against wall/core engaged, and shoulders against wall using 1# flex, scaption and abd x 12 each returning demo and having pt focus on erect posture Free Motion machine bil UE scap retract 3# 2 x 10 with cuing for proper technique and scapular engagement at the end ROM Lt tricep ext 3# x 10, tactile cues for correct UE position and scapular engagement; tactile cuing for correct posture with shoulder ROM as pt was demonstrating side lean Manual Therapy STM with cocoa butter to Lt pect tendon, chest and  area inferior to mastectomy scar with UE in various degrees of abduction, also to lateral trunk PROM left shoulder flexion, scaption, and abduction to restore functional ROM and with scapular depression throughout allowing for improved ROM with less discomfort  07/10/2023 Pulleys flexion and abd x 2 min Seated ball rolls: flexion x10 Doorway pec stretch 10" x5 Standing open book stretch at wall x10 bil each   Trigger Point Dry Needling  Subsequent Treatment: Instructions provided previously at initial dry needling treatment.  Patient Verbal Consent Given: Yes Education Handout Provided: Previously Provided Muscles Treated: Lt pec major and minor, Lt lats  Electrical Stimulation Performed: No Treatment Response/Outcome: twitch response and improved tissue mobility  Elongation and release to Lt shoulder and pec and lats after dry needling   07/12/23 Pulleys x 2 min in direction of flexion and 2 min in direction of abduction with v/c and t/c for correct UE positioning throughout Supine scap exercises on large mat table with yellow band x 10 reps each in  to narrow and wide grip, horizontal abduction, diagonals with pt returning therapist demo for each Ball up wall x 10 reps in direction of flexion and 10 in L abduction with v/c and t/c for form IASTM with cocoa butter and boomerang tool and WAVE to L pec and L lateral trunk with pt reporting decreased tightness at end of session and improved mobility/ Pec has increased fascial adhesions  PATIENT EDUCATION:  Education details: Standing and supine dowel exercises Person educated: Patient Education method: Explanation, Demonstration, and Handouts, verbal and tactile cues Education comprehension: verbalized understanding and returned demonstration, and will benefit from further review  HOME EXERCISE PROGRAM: 4 post op exercises Dowel exercises in standing and supine Supine scap series  ASSESSMENT:  CLINICAL IMPRESSION:  Pt had excellent response to DN. She notes improvements in pain from 60-70% overall with therapy. She has achieved all of her goals, and has made excellent improvement in her ROM. She does still get mild pain at end ranges of motion. She has been compliant with her HEP, and her HEP was updated today to include postural theraband and jobes flexion and scaption. She is able to don her coat and pull down her seat belt easier and with less pain. She is discharged from formal PT OBJECTIVE IMPAIRMENTS: decreased activity tolerance, decreased knowledge of condition, decreased ROM, decreased strength, hypomobility, increased fascial restrictions, impaired flexibility, impaired UE functional use, postural dysfunction, and pain.   ACTIVITY LIMITATIONS: lifting, reach over head, and hygiene/grooming  PARTICIPATION LIMITATIONS: driving, occupation, and anything requiring overhead reaching  PERSONAL FACTORS: 3+ comorbidities: Triple Negative breast Cancer s/p chemo and radiation  are also affecting patient's functional outcome.   REHAB POTENTIAL: Good  CLINICAL DECISION MAKING:  Stable/uncomplicated  EVALUATION COMPLEXITY: Low  GOALS: Goals reviewed with patient? Yes  SHORT TERM GOALS=LONG TERM GOALS: Target date: 06/07/2023  Pt will be independent with a HEP to improve left shoulder ROM and strength Baseline: Goal status: MET 05/29/2023 2.  Pt will improve left shoulder flexion to atleast 145 degrees for improved reaching Baseline: 132 Goal status: MET 06/07/2023  3.  Pt will improve left shoulder abd to atleast 130 degrees   with 0-min UT compensation Baseline: 70 Goal status: MET 06/07/2023  4.  Left UE pain will be decreased by 50% or greater Baseline: 06/26/23: 40% improvement reported at this time Goal status: MET 60% better  5.  Quick dash will be improved to no greater than 30% to demonstrate improved function Baseline:  Goal status: MET 30% 07/05/2023, 07/26/2023 27%  6. Able to pull down seat belt with left hand and put coat on with minimal pain  Baseline: 06/26/23:  Goal Status: MET 07/26/2023  PLAN:  PT FREQUENCY: 2x/week  PT DURATION: 4 weeks  PLANNED INTERVENTIONS: 97164- PT Re-evaluation, 97110-Therapeutic exercises, 97530- Therapeutic activity, 97112- Neuromuscular re-education, 97535- Self Care, 78295- Manual therapy, 97760- Orthotic Fit/training, Patient/Family education, Balance training, Dry Needling, and Joint mobilization  PLAN FOR NEXT SESSION: Cont postural strength and STM prn to pecs/lateral trunk,  assess response to dry needling , how are supine scap?  ERO next to determine if more PT after 08/02/23.  One more needling session is scheduled with this PT.  PHYSICAL THERAPY DISCHARGE SUMMARY  Visits from Start of Care: 18  Current functional level related to goals / functional outcomes: Achieved goals   Remaining deficits: End range tightness bt greatly improved overall   Education / Equipment: HEP, theraband   Patient agrees to discharge. Patient goals were met. Patient is being discharged due to meeting the stated rehab  goals.  Alvira Monday, PT 07/26/23 2:58 PM

## 2023-07-31 ENCOUNTER — Ambulatory Visit: Payer: Medicare Other

## 2023-08-17 ENCOUNTER — Other Ambulatory Visit: Payer: Self-pay

## 2023-08-17 ENCOUNTER — Other Ambulatory Visit: Payer: Self-pay | Admitting: Hematology

## 2023-08-17 MED ORDER — LIDOCAINE-PRILOCAINE 2.5-2.5 % EX CREA: 1.0000 | TOPICAL_CREAM | CUTANEOUS | 2 refills | Status: AC | PRN

## 2023-08-30 ENCOUNTER — Encounter: Payer: Self-pay | Admitting: Emergency Medicine

## 2023-08-30 ENCOUNTER — Ambulatory Visit: Payer: Medicare Other | Admitting: Emergency Medicine

## 2023-08-30 VITALS — BP 125/83 | HR 71 | Ht 61.0 in | Wt 180.6 lb

## 2023-08-30 DIAGNOSIS — R053 Chronic cough: Secondary | ICD-10-CM

## 2023-08-30 DIAGNOSIS — K1230 Oral mucositis (ulcerative), unspecified: Secondary | ICD-10-CM

## 2023-08-30 NOTE — Patient Instructions (Signed)
 We reviewed your pulmonary function testing today.  There is no evidence of COPD or asthma.  This is good news. Keep your albuterol  available to use 2 puffs when you need it for chest tightness, shortness of breath, cough and mucus production. Agree with using your Prilosec or Pepcid  as needed for heartburn/reflux symptoms.  If you feel that you are cough is beginning to flare then would recommend that you take the Prilosec daily for 7 to 10 days.  Depending on how you respond to this and may be beneficial for you to take it daily long-term. Would recommend starting loratadine  10 mg once daily (generic Claritin ) during the allergy season and to use it daily when you are having flaring cough and mucus symptoms. Please follow with Dr. Baldwin Levee in 1 year.  Call sooner if having any problems or new symptoms.

## 2023-08-30 NOTE — Progress Notes (Signed)
 Subjective:    Patient ID: Lindsay [name], female    DOB: [birth year], 68 y.o.   MRN: [id number]  HPI  ROV 08/30/2023 --Lindsay [name] is 68 years old with a minimal tobacco history, history of breast cancer with radiation change in the left upper lobe, history of chronic bronchitis, COVID-19 in 01/2021.  I saw her in February.  She has underlying GERD with a hiatal hernia on Pepcid  or Prilosec as needed.  She underwent pulmonary function testing as below.  She had increased chest burning, more cough with some mucous production for about 2 weeks. She had to use her albuterol , helped w some SOB and mucous management.   PFT 07/12/2023 reviewed by me, show normal airflows without a bronchodilator response, normal lung volumes, normal diffusion capacity, normal flow-volume loop.     Review of Systems As per HPI  Past Medical History:  Diagnosis Date   Breast CA (HCC)    Bronchitis    Family history of bone cancer    Family history of breast cancer    Family history of prostate cancer    Fibromyalgia    HCV (hepatitis C virus)    MRSA (methicillin resistant Staphylococcus aureus) 2012   Shingles      Family History  Problem Relation Age of Onset   Breast cancer Maternal Aunt 78   Prostate cancer Maternal Uncle        dx. in his 2s   Bone cancer Maternal Grandmother        dx. 34   Cancer Paternal Grandmother        possible cancer, unknown type    Prostate cancer Cousin        maternal first cousin   Heart Problems Brother    Bipolar disorder Brother    Cancer Cousin        unknown type, paternal first cousin     Social History   Socioeconomic History   Marital status: Single    Spouse name: Not on file   Number of children: Not on file   Years of education: Not on file   Highest education level: Not on file  Occupational History   Not on file  Tobacco Use   Smoking status: Former    Types: Cigarettes    Start date: 74    Quit date: 1973    Years since quitting: 52.3    Smokeless tobacco: Never  Vaping Use   Vaping status: Never Used  Substance and Sexual Activity   Alcohol use: Not Currently   Drug use: Never   Sexual activity: Not Currently  Other Topics Concern   Not on file  Social History Narrative   Not on file   Social Drivers of Health   Financial Resource Strain: Not on file  Food Insecurity: Not on file  Transportation Needs: Not on file  Physical Activity: Not on file  Stress: Not on file  Social Connections: Unknown (09/20/2021)   Received from [facility]   Social Network    Social Network: Not on file  Intimate Partner Violence: Unknown (08/11/2021)   Received from [facility], [facility]   HITS    Physically Hurt: Not on file    Insult or Talk Down To: Not on file    Threaten Physical Harm: Not on file    Scream or Curse: Not on file     Allergies  Allergen Reactions   Codeine Hives and Nausea And Vomiting  Outpatient Medications Prior to Visit  Medication Sig Dispense Refill   acetaminophen  (TYLENOL ) 650 MG CR tablet Take 650 mg by mouth every 8 (eight) hours as needed for pain.      ascorbic acid (VITAMIN C) 250 MG CHEW Chew by mouth.     lidocaine  (XYLOCAINE ) 2 % solution Use as directed 15 mLs in the mouth or throat every 4 (four) hours as needed for mouth pain. 100 mL 2   lidocaine -prilocaine  (EMLA ) cream Apply 1 Application topically as needed. 30 g 2   omeprazole  (PRILOSEC) 20 MG capsule Take 1 capsule (20 mg total) by mouth daily. 30 capsule 2   benzonatate  (TESSALON ) 200 MG capsule Take 1 capsule (200 mg total) by mouth 3 (three) times daily as needed for cough. (Patient not taking: Reported on 08/30/2023) 20 capsule 0   dexamethasone  (DECADRON ) 0.5 MG/5ML solution Take 10 ml (1 mg) swish for 2 minutes and then spit.  Do this four times daily as needed Do Not eat or drink for 1 hour after. (Patient not taking: Reported on 08/30/2023) 240 mL 0   diphenoxylate -atropine  (LOMOTIL ) 2.5-0.025 MG  tablet Take 1-2 tablets by mouth 4 (four) times daily as needed for diarrhea or loose stools. (Patient not taking: Reported on 07/05/2023) 30 tablet 1   doxycycline  (VIBRA -TABS) 100 MG tablet Take 1 tablet (100 mg total) by mouth 2 (two) times daily. (Patient not taking: Reported on 07/05/2023) 14 tablet 0   gabapentin  (NEURONTIN ) 300 MG capsule Take 300 mg by mouth 3 (three) times daily as needed. (Patient not taking: Reported on 07/05/2023)     levofloxacin  (LEVAQUIN ) 500 MG tablet Take 1 tablet (500 mg total) by mouth daily. (Patient not taking: Reported on 07/05/2023) 7 tablet 0   methocarbamol  (ROBAXIN ) 750 MG tablet Take 1 tablet (750 mg total) by mouth 4 (four) times daily as needed (use for muscle cramps/pain). (Patient not taking: Reported on 08/30/2023) 30 tablet 2   mucosal barrier oral (GELCLAIR) GEL Take 1 packet by mouth as needed. (Patient not taking: Reported on 07/05/2023)     valACYclovir  (VALTREX ) 1000 MG tablet Take 1 tablet (1,000 mg total) by mouth 2 (two) times daily. (Patient not taking: Reported on 08/30/2023) 14 tablet 1   No facility-administered medications prior to visit.        Objective:   Physical Exam  Vitals:   08/30/23 1133  BP: 125/83  Pulse: 71  SpO2: 96%  Weight: 180 lb 9.6 oz (81.9 kg)  Height: 5\' 1"  (1.549 m)   Gen: Pleasant, well-nourished, in no distress,  normal affect  ENT: No lesions,  mouth clear,  oropharynx clear, no postnasal drip  Neck: No JVD, no stridor  Lungs: No use of accessory muscles, no crackles or wheezing on normal respiration, no wheeze on forced expiration  Cardiovascular: RRR, heart sounds normal, no murmur or gallops, no peripheral edema  Musculoskeletal: No deformities, no cyanosis or clubbing  Neuro: alert, awake, non focal  Skin: Warm, no lesions or rash     Assessment & Plan:  Chronic cough Chronic cough with mucus production, flaring recently but with clear mucus.  She did not require prednisone  or antibiotics on  that occasion, did use albuterol  which was helpful.  Question whether brought on by allergy season, possibly also some breakthrough GERD.  Working to try to treat both.  Did explain to her that maintenance medication for either allergies or GERD when she is flaring would make sense, possibly will be necessary long-term.  Her pulmonary function testing was reassuring.  No evidence of asthma, COPD.  Discussed this with her.  We reviewed your pulmonary function testing today.  There is no evidence of COPD or asthma.  This is good news. Keep your albuterol  available to use 2 puffs when you need it for chest tightness, shortness of breath, cough and mucus production. Agree with using your Prilosec or Pepcid  as needed for heartburn/reflux symptoms.  If you feel that you are cough is beginning to flare then would recommend that you take the Prilosec daily for 7 to 10 days.  Depending on how you respond to this and may be beneficial for you to take it daily long-term. Would recommend starting loratadine  10 mg once daily (generic Claritin ) during the allergy season and to use it daily when you are having flaring cough and mucus symptoms. Please follow with Dr. Baldwin Levee in 1 year.  Call sooner if having any problems or new symptoms.  Time spent 36 minutes  Racheal Buddle, MD, PhD 08/30/2023, 12:05 PM [facility] [phone] or if no answer before 7:00PM call [phone] For any issues after 7:00PM please call eLink [phone]

## 2023-08-30 NOTE — Assessment & Plan Note (Signed)
 Chronic cough with mucus production, flaring recently but with clear mucus.  She did not require prednisone  or antibiotics on that occasion, did use albuterol  which was helpful.  Question whether brought on by allergy season, possibly also some breakthrough GERD.  Working to try to treat both.  Did explain to her that maintenance medication for either allergies or GERD when she is flaring would make sense, possibly will be necessary long-term.  Her pulmonary function testing was reassuring.  No evidence of asthma, COPD.  Discussed this with her.  We reviewed your pulmonary function testing today.  There is no evidence of COPD or asthma.  This is good news. Keep your albuterol  available to use 2 puffs when you need it for chest tightness, shortness of breath, cough and mucus production. Agree with using your Prilosec or Pepcid  as needed for heartburn/reflux symptoms.  If you feel that you are cough is beginning to flare then would recommend that you take the Prilosec daily for 7 to 10 days.  Depending on how you respond to this and may be beneficial for you to take it daily long-term. Would recommend starting loratadine  10 mg once daily (generic Claritin ) during the allergy season and to use it daily when you are having flaring cough and mucus symptoms. Please follow with Dr. Baldwin Levee in 1 year.  Call sooner if having any problems or new symptoms.

## 2023-09-13 ENCOUNTER — Inpatient Hospital Stay: Attending: Nurse Practitioner

## 2023-09-13 ENCOUNTER — Ambulatory Visit
Admission: RE | Admit: 2023-09-13 | Discharge: 2023-09-13 | Disposition: A | Discharge status: Home / Self Care | Source: Ambulatory Visit | Attending: Hematology | Admitting: Hematology

## 2023-09-13 DIAGNOSIS — Z1231 Encounter for screening mammogram for malignant neoplasm of breast: Secondary | ICD-10-CM

## 2023-09-13 DIAGNOSIS — Z95828 Presence of other vascular implants and grafts: Secondary | ICD-10-CM

## 2023-09-13 DIAGNOSIS — Z853 Personal history of malignant neoplasm of breast: Secondary | ICD-10-CM | POA: Diagnosis present

## 2023-09-13 DIAGNOSIS — Z452 Encounter for adjustment and management of vascular access device: Secondary | ICD-10-CM | POA: Diagnosis present

## 2023-09-13 MED ORDER — HEPARIN SOD (PORK) LOCK FLUSH 100 UNIT/ML IV SOLN
500.0000 [IU] | Freq: Once | INTRAVENOUS | Status: AC
  Administered 2023-09-13: 500 [IU]

## 2023-09-13 MED ORDER — SODIUM CHLORIDE 0.9% FLUSH
10.0000 mL | Freq: Once | INTRAVENOUS | Status: AC
  Administered 2023-09-13: 10 mL

## 2023-10-18 ENCOUNTER — Ambulatory Visit: Payer: Self-pay | Admitting: Emergency Medicine

## 2023-10-18 MED ORDER — DOXYCYCLINE HYCLATE 100 MG PO TABS: 100.0000 mg | ORAL_TABLET | Freq: Two times a day (BID) | ORAL | 0 refills | Status: DC

## 2023-10-18 MED ORDER — PREDNISONE 10 MG PO TABS: ORAL_TABLET | ORAL | 0 refills | Status: AC

## 2023-10-18 NOTE — Telephone Encounter (Signed)
 FYI Only or Action Required?: Action required by provider  Patient is followed in Pulmonology for chronic bronchitis and hx lung cancer, last seen on 08/30/2023 by Denson Flake, MD. Called Nurse Triage reporting Shortness of Breath, Fatigue, Chest Pain, Cough, Excessive Sweating, Chills, and clammy. Symptoms began a week ago. Interventions attempted: OTC medications: claritin  and Rescue inhaler. Symptoms are: rapidly worsening.  Triage Disposition: Go to ED or PCP/Alternative with Approval  Patient/caregiver understands and will follow disposition?: No, wishes to speak with PCP - Please call back with further recommendations, requesting meds       Copied from CRM #[id number]. Topic: Clinical - Red Word Triage >> Oct 18, 2023  9:19 AM Lindsay [name] wrote: Red Word that prompted transfer to Nurse Triage: Patient [phone] states is having a flare up of chronic bronchitis and asking for a medications that was discussed in office visit 08/30/23 with Dr. Baldwin Levee. Patient states is using albuterol , and Claritin  and it's not helping much, patient is not using Prilosec. Patient wants Augmentin . Patient states fatigue, tightness in chest, shortness of breath, coughing phlegm clear and sometimes greenish, cold and calmy or hot and sweating, does not want to get dehydrated started last week. Patient denies a fever, pain, nor dizziness. Please advise.  [facility] [place], [place] -[address] [place] [address] Phone: [phone] Fax: [phone]  Reason for Disposition  Patient sounds very sick or weak to the triager  Answer Assessment - Initial Assessment Questions E2C2 Pulmonary Triage - Initial Assessment Questions Chief Complaint (e.g., cough, sob, wheezing, fever, chills, sweat or additional symptoms) *Go to specific symptom protocol after initial questions. No coughing fits Coughing up clear to greenish at times sputum Sweating and hot or cold and clammy Woke up in  cold sweat Chronic bronchitis flared up, these are just some of the symptoms, had cancer 4 years ago, still place in lungs where radiation did some damage, so when get out for yard work or dusty environment, triggered it need augmentin , last time had it went to ED and got prednisone  and augmentin  and antibx and x-ray, that cleared it up Dr. Lacinda Pica in my family Tired Chest tightness feels like usual with SOB No fever but not taken temp every day Speaking in full sentences  How long have symptoms been present? Last week  MEDICINES:   Have you used any OTC meds to help with symptoms? Yes If yes, ask What medications? Claritin  not helping  Have you used your inhalers/maintenance medication? Yes If yes, What medications? Albuterol  inhaler, no maintenance inhaler, using albuterol  will make cough go away for 4-5 hours  If inhaler, ask How many puffs and how often? Note: Review instructions on medication in the chart. 1-2x in morning 1-1.5 hours apart, if do it afternoon keeps me up and can't sleep  OXYGEN: Do you wear supplemental oxygen? No  Do you monitor your oxygen levels? No  5. RECURRENT SYMPTOM: Have you had difficulty breathing before? If Yes, ask: When was the last time? and What happened that time?      Yes takes almost 2 weeks of augmentin   7. LUNG HISTORY: Do you have any history of lung disease?  (e.g., pulmonary embolus, asthma, emphysema)     Chronic bronchitis, hx lung cancer  Headache this morning No nausea, heart racing, pain nothing more than usual for arm/back/jaw/shoulders/neck maybe, maybe not  Protocols used: Breathing Difficulty-A-AH

## 2023-10-18 NOTE — Telephone Encounter (Signed)
 Called patient to inform her that we sent in script for antibiotics and steriods

## 2023-10-18 NOTE — Telephone Encounter (Signed)
 Please give her prescription for doxycycline  1 mg twice daily x 7 days Prednisone  prescription: Take 40mg  daily for 3 days, then 30mg  daily for 3 days, then 20mg  daily for 3 days, then 10mg  daily for 3 days, then stop

## 2023-11-15 ENCOUNTER — Inpatient Hospital Stay

## 2023-11-19 ENCOUNTER — Telehealth: Payer: Self-pay | Admitting: Hematology

## 2023-11-19 NOTE — Telephone Encounter (Signed)
 Rescheule appointment per provider pal.  Called left VM with changes made to the upcoming appointment. ( Patient MailBox is full couldn't  leave message )

## 2023-12-06 ENCOUNTER — Inpatient Hospital Stay: Attending: Nurse Practitioner

## 2023-12-06 DIAGNOSIS — Z853 Personal history of malignant neoplasm of breast: Secondary | ICD-10-CM | POA: Insufficient documentation

## 2023-12-06 DIAGNOSIS — Z95828 Presence of other vascular implants and grafts: Secondary | ICD-10-CM

## 2023-12-06 DIAGNOSIS — Z452 Encounter for adjustment and management of vascular access device: Secondary | ICD-10-CM | POA: Diagnosis present

## 2023-12-06 MED ORDER — SODIUM CHLORIDE 0.9% FLUSH
10.0000 mL | Freq: Once | INTRAVENOUS | Status: AC
  Administered 2023-12-06: 10 mL

## 2023-12-06 MED ORDER — HEPARIN SOD (PORK) LOCK FLUSH 100 UNIT/ML IV SOLN
500.0000 [IU] | Freq: Once | INTRAVENOUS | Status: AC
Start: 2023-12-06 — End: 2023-12-06
  Administered 2023-12-06: 500 [IU]

## 2024-01-09 ENCOUNTER — Other Ambulatory Visit: Payer: Self-pay

## 2024-01-09 DIAGNOSIS — C50412 Malignant neoplasm of upper-outer quadrant of left female breast: Secondary | ICD-10-CM

## 2024-01-10 ENCOUNTER — Inpatient Hospital Stay: Attending: Nurse Practitioner

## 2024-01-10 DIAGNOSIS — Z853 Personal history of malignant neoplasm of breast: Secondary | ICD-10-CM | POA: Insufficient documentation

## 2024-01-10 DIAGNOSIS — C50412 Malignant neoplasm of upper-outer quadrant of left female breast: Secondary | ICD-10-CM

## 2024-01-10 LAB — CBC WITH DIFFERENTIAL (CANCER CENTER ONLY)
Abs Immature Granulocytes: 0.02 K/uL (ref 0.00–0.07)
Basophils Absolute: 0.1 K/uL (ref 0.0–0.1)
Basophils Relative: 1 %
Eosinophils Absolute: 0.3 K/uL (ref 0.0–0.5)
Eosinophils Relative: 6 %
HCT: 38.1 % (ref 36.0–46.0)
Hemoglobin: 12.9 g/dL (ref 12.0–15.0)
Immature Granulocytes: 0 %
Lymphocytes Relative: 39 %
Lymphs Abs: 1.9 K/uL (ref 0.7–4.0)
MCH: 31.1 pg (ref 26.0–34.0)
MCHC: 33.9 g/dL (ref 30.0–36.0)
MCV: 91.8 fL (ref 80.0–100.0)
Monocytes Absolute: 0.5 K/uL (ref 0.1–1.0)
Monocytes Relative: 11 %
Neutro Abs: 2.1 K/uL (ref 1.7–7.7)
Neutrophils Relative %: 43 %
Platelet Count: 139 K/uL — ABNORMAL LOW (ref 150–400)
RBC: 4.15 MIL/uL (ref 3.87–5.11)
RDW: 12.8 % (ref 11.5–15.5)
WBC Count: 4.9 K/uL (ref 4.0–10.5)
nRBC: 0 % (ref 0.0–0.2)

## 2024-01-10 LAB — CMP (CANCER CENTER ONLY)
ALT: 15 U/L (ref 0–44)
AST: 31 U/L (ref 15–41)
Albumin: 3.8 g/dL (ref 3.5–5.0)
Alkaline Phosphatase: 67 U/L (ref 38–126)
Anion gap: 3 — ABNORMAL LOW (ref 5–15)
BUN: 17 mg/dL (ref 8–23)
CO2: 30 mmol/L (ref 22–32)
Calcium: 8.7 mg/dL — ABNORMAL LOW (ref 8.9–10.3)
Chloride: 106 mmol/L (ref 98–111)
Creatinine: 0.57 mg/dL (ref 0.44–1.00)
GFR, Estimated: >60 mL/min (ref >60)
Glucose, Bld: 98 mg/dL (ref 70–99)
Potassium: 3.9 mmol/L (ref 3.5–5.1)
Sodium: 139 mmol/L (ref 135–145)
Total Bilirubin: 0.5 mg/dL (ref 0.0–1.2)
Total Protein: 6 g/dL — ABNORMAL LOW (ref 6.5–8.1)

## 2024-01-10 LAB — GENETIC SCREENING ORDER

## 2024-01-20 LAB — SIGNATERA
SIGNATERA MTM READOUT: 0 MTM/ml
SIGNATERA TEST RESULT: NEGATIVE

## 2024-01-22 ENCOUNTER — Ambulatory Visit: Payer: Self-pay

## 2024-01-24 ENCOUNTER — Inpatient Hospital Stay: Admitting: Hematology

## 2024-01-28 ENCOUNTER — Encounter: Payer: Self-pay | Admitting: Hematology

## 2024-01-28 NOTE — Telephone Encounter (Signed)
 Called pt ans spoke with her and  she is aware of her upcoming appt. Oct 9 ,2025

## 2024-01-29 ENCOUNTER — Inpatient Hospital Stay: Admitting: Hematology

## 2024-02-14 ENCOUNTER — Encounter: Payer: Self-pay | Admitting: Hematology

## 2024-02-14 ENCOUNTER — Inpatient Hospital Stay: Attending: Nurse Practitioner | Admitting: Hematology

## 2024-02-14 VITALS — BP 138/88 | HR 70 | Temp 98.3°F | Resp 20 | Ht 61.0 in | Wt 176.0 lb

## 2024-02-14 DIAGNOSIS — Z853 Personal history of malignant neoplasm of breast: Secondary | ICD-10-CM | POA: Diagnosis present

## 2024-02-14 DIAGNOSIS — C50412 Malignant neoplasm of upper-outer quadrant of left female breast: Secondary | ICD-10-CM

## 2024-02-14 DIAGNOSIS — Z9012 Acquired absence of left breast and nipple: Secondary | ICD-10-CM | POA: Diagnosis not present

## 2024-02-14 DIAGNOSIS — Z171 Estrogen receptor negative status [ER-]: Secondary | ICD-10-CM

## 2024-02-14 NOTE — Progress Notes (Signed)
 [facility]   [phone] [phone]   Clinic Follow up Note   Patient Care Team: Pcp, No as PCP - General Tyree Nanetta SAILOR, RN as Oncology Nurse Navigator Curvin Deward MOULD, MD as Consulting Physician (General Surgery) Lanny Callander, MD as Consulting Physician (Hematology) Izell Domino, MD as Attending Physician (Radiation Oncology) Burton, Lacie K, NP as Nurse Practitioner (Nurse Practitioner)  Date of Service:  02/14/2024  CHIEF COMPLAINT: f/u of breast cancer  CURRENT THERAPY:  Cancer surveillance  Oncology History   Malignant neoplasm of upper-outer quadrant of left breast in female, estrogen receptor negative (HCC)  Left breast Multifocal Metaplastic cancer, stage IIIC pT3N3aM0, including one unresectable axillary node, metaplastic carcinoma, triple negative, grade 3 -diagnosed in 10/2019. CT CAP and bone scan 11/2019 were negative for distant metastasis.  -S/p left mastectomy with Dr Curvin on 12/26/19. Surgical path showed: 6cm multifocal metaplastic carcinoma; 13/16 positive LN, one of which was not able to be removed due to vascular invasion.  -s/p adjuvant carbo/taxol  02/05/20 - 05/13/20 followed by Adriamycin /cytoxan  05/20/20 - 07/13/20 along with Keytruda  q3weeks through 01/19/21.  She tolerated AC poorly with mucositis, weight loss, constipation, and weakness.  -interim left axillary US  06/17/20 is negative for adenopathy, c/w response to adjuvant chemo -s/p adjuvant radiation with Dr. Izell 08/24/20 - 10/05/20 -restaging CT chest on 12/07/2022 showed no evidence of cancer recurrence, except for surgical and postradiation changes. -she will keep her port for now. She will continue to return for flush every 6-8 weeks. -Circulating tumor DNA Signatera was negative in February 2025, will continue blood test every 6 months.  Assessment & Plan Malignant neoplasm of upper-outer quadrant of left breast, status post mastectomy and ongoing surveillance Status  post mastectomy for malignant neoplasm of the upper-outer quadrant of the left breast. Currently under ongoing surveillance. No new nodules or abnormalities on examination. Mammogram results satisfactory. Signatera test negative. Slightly low platelet count, consistent with previous results and not concerning. Calcium and protein levels slightly low, manageable with dietary adjustments. Prefers to keep the port, reassessment planned after five years. - Continue surveillance every six months - Perform lab tests every six months - Perform Signatera test every six months - Flush port every six weeks - Schedule follow-up visit in six months - Educate on dietary intake of calcium and protein  Plan - She is clinically doing well, no new concerns. -Lab reviewed, Signatera was negative last month -Continue port flush every 6 weeks - Continue cancer surveillance, we will see her back in 6 months with lab.     SUMMARY OF ONCOLOGIC HISTORY: Oncology History Overview Note  Cancer Staging Malignant neoplasm of upper-outer quadrant of left breast in female, estrogen receptor negative (HCC) Staging form: Breast, AJCC 8th Edition - Clinical stage from 10/28/2019: Stage IIIB (cT2, cN1, cM0, G3, ER-, PR-, HER2-) - Signed by Izell Domino, MD on 11/05/2019 - Pathologic stage from 12/26/2019: Stage IIIC (pT3, pN3a, cM0, G3, ER-, PR-, HER2-) - Signed by Lanny Callander, MD on 01/28/2020    Malignant neoplasm of upper-outer quadrant of left breast in female, estrogen receptor negative (HCC)  10/27/2019 Mammogram   IMPRESSION: 1. Right breast calcifications spanning 2.6 cm in the upper slightly medial breast are indeterminate.   2. Left breast dominant mass at 3 o'clock, 6 cm from nipple measuring 3x3.9x2.9 cm is highly suspicious. There is overlying skin thickening, skin involvement is not excluded.   3. Left breast mass/distorted tissue at 1 o'clock, 4cm from nipple measuring 3.0x1.2x2.9  cm is likely in  contiguity with the dominant mass at 3 o'clock and is also highly suspicious. With the dominant lesion the overall span a disease is approximately 6 cm.   4. Left breast distortion at 2 o'clock 10 cm from the nipple is Suspicious, measuring 1.0 x 0.8 cm.   5.  Abnormal left axillary lymph nodes (2). Cortical thickness measures up to 0.6 cm.    10/28/2019 Initial Biopsy   Diagnosis 1. Breast, left, needle core biopsy, 3 o'clock, 5cmfn - INVASIVE MAMMARY CARCINOMA. 2. Breast, left, needle core biopsy, 2 o'clock, 10cmfn - INVASIVE MAMMARY CARCINOMA. 3. 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 Microscopic Comment 1. -3. Overall, immunohistochemistry favors a pronounced desmoplastic response, but metaplastic carcinoma cannot be excluded (Epithelioid component: CKAE1AE3 strong +, CK5/6 weak +). The greatest linear extent of tumor in any one core in specimen 1 is 14 mm. The greatest linear extent of tumor in any one core in specimen 2 is 16 mm.   10/28/2019 Receptors her2   3. PROGNOSTIC INDICATORS Results: IMMUNOHISTOCHEMICAL AND MORPHOMETRIC ANALYSIS PERFORMED MANUALLY The tumor cells are EQUIVOCAL for Her2 (2+). Her2 by FISH will be performed and results reported separately. Estrogen Receptor: 0%, NEGATIVE Progesterone Receptor: 0%, NEGATIVE Proliferation Marker Ki67: 10%    3. FLUORESCENCE IN-SITU HYBRIDIZATION Results: GROUP 5: HER2 **NEGATIVE** Equivocal form of amplification of the HER2 gene was detected in the IHC 2+ tissue sample received from this individual. HER2 FISH was performed by a technologist and cell imaging and analysis on the [employer].   10/28/2019 Cancer Staging   Staging form: Breast, AJCC 8th Edition - Clinical stage from 10/28/2019: Stage IIIB (cT2, cN1, cM0, G3, ER-, PR-, HER2-) - Signed by Izell Domino, MD on 11/05/2019   10/31/2019 Initial Diagnosis   Malignant neoplasm of upper-outer quadrant of left  breast in female, estrogen receptor negative (HCC)   10/31/2019 Pathology Results   Diagnosis Breast, right, needle core biopsy, UOQ, posterior - FOCAL USUAL DUCTAL HYPERPLASIA AND FIBROCYSTIC CHANGES WITH CALCIFICATIONS - FIBROADENOMATOID CHANGES - NO MALIGNANCY IDENTIFIED Microscopic Comment These results were called to [facility] on November 03, 2019.   11/05/2019 Genetic Testing   She declined Genetic testing    11/21/2019 Imaging   CT CAP w contrast  IMPRESSION: 1. Left breast mass and prominent left axillary lymph nodes, containing biopsy marking clips, consistent with newly diagnosed breast malignancy. 2. No definite evidence of distant metastatic disease in the chest, abdomen, or pelvis. 3. There are occasional small pulmonary nodules, measuring 2-3 mm, most likely incidental sequelae of prior infection or inflammation although metastatic disease is not excluded. Attention on follow-up. 4. Hepatic steatosis. 5. Aortic Atherosclerosis (ICD10-I70.0).     11/21/2019 Imaging   Bone Scan Whole Body IMPRESSION: 1. Single focus of uptake associated with a subacute fracture of LEFT anterior fourth rib. 2. No additional areas of abnormal uptake.   12/26/2019 Cancer Staging   Staging form: Breast, AJCC 8th Edition - Pathologic stage from 12/26/2019: Stage IIIC (pT3, pN3a, cM0, G3, ER-, PR-, HER2-) - Signed by Lanny Callander, MD on 01/28/2020   12/26/2019 Surgery   LEFT MASTECTOMY MODIFIED RADICAL and PAC Placement by Dr Curvin   12/26/2019 Pathology Results   FINAL MICROSCOPIC DIAGNOSIS:   A. 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  - Ductal carcinoma in situ, high nuclear grade with central  necrosis.  - Margins of resection:  - Metaplastic carcinoma focally involves the anterior margin and is < 1  mm from the posterior margin.  - DCIS is < 1 mm from the anterior margin.   - Metastatic carcinoma in (13) of (16) lymph nodes with extranodal  extension.  - Biopsy clip sites in breast and one lymph node.  - See oncology table.    ADDENDUM:  PROGNOSTIC INDICATOR RESULTS:  Immunohistochemical and morphometric analysis performed manually  The tumor cells are EQUIVOCAL for Her2 (2+). Her2 FISH has been ordered  and will be reported in an addendum.  Estrogen Receptor:       NEGATIVE  Progesterone Receptor:   NEGATIVE  Proliferation Marker Ki-67:   30%    ADDENDUM:  FLOURESCENCE IN-SITU HYBRIDIZATION RESULTS:  GROUP 5:   HER2 NEGATIVE   01/28/2020 Echocardiogram   Baseline Echo  IMPRESSIONS     1. Left ventricular ejection fraction, by estimation, is 60 to 65%. The  left ventricle has normal function. The left ventricle has no regional  wall motion abnormalities. Left ventricular diastolic parameters are  consistent with Grade I diastolic  dysfunction (impaired relaxation). The average left ventricular global  longitudinal strain is -19.8 %.   2. Right ventricular systolic function is normal. The right ventricular  size is normal. Tricuspid regurgitation signal is inadequate for assessing  PA pressure.   3. The mitral valve is normal in structure. No evidence of mitral valve  regurgitation. No evidence of mitral stenosis.   4. The aortic valve is normal in structure. Aortic valve regurgitation is  not visualized. No aortic stenosis is present.   5. The inferior vena cava is normal in size with greater than 50%  respiratory variability, suggesting right atrial pressure of 3 mmHg.    02/05/2020 - 01/19/2021 Chemotherapy   Keytruda  q3weeks (to be taken for 1 whole year) with weekly Carboplatin /Taxol  for 12 weeks starting 02/05/20-05/13/20 followed by Adriamycin /Cytoxan  q2weeks X4 starting 05/20/20-07/13/20.  ----Continue Keytruda  q3weeks from 08/03/20 to complete 1 year of treatment from 02/05/20).    06/17/2020 Breast US    FINDINGS: On physical  exam,well-healed scars of LEFT mastectomy and LEFT axillary node dissection. I palpate no axillary mass. Ultrasound is performed, showing normal appearing LEFT axillary contents. No mass or enlarged lymph nodes. IMPRESSION: Ultrasound is negative for LEFT axillary adenopathy.   08/24/2020 - 10/05/2020 Radiation Therapy   Adjuvant RT per Dr. Izell starting 08/24/20-10/05/20   04/14/2021 Survivorship   SCP delivered by Lacie Burton, NP      Discussed the use of AI scribe software for clinical note transcription with the patient, who gave verbal consent to proceed.  History of Present Illness Lindsay [name] is a 68 year old female with breast cancer who presents for follow-up.  She experiences persistent numbness on the left side since her therapy. Her recent mammogram was normal. She underwent a mastectomy in September 2021.  Her energy levels have decreased, and she experiences leg swelling, which she attributes to chemotherapy. She manages the swelling by propping up her leg but does not wear compression sleeves regularly.  Recent lab results show a slightly low platelet count, low calcium, and low protein levels, which she has experienced before. She drinks milk regularly.  She has no significant stomach pain or tenderness and no persistent back tenderness unless lifting heavy objects.     All other systems were reviewed with the patient and are negative.  MEDICAL HISTORY:  Past Medical History:  Diagnosis  Date   Breast CA (HCC)    Bronchitis    Family history of bone cancer    Family history of breast cancer    Family history of prostate cancer    Fibromyalgia    HCV (hepatitis C virus)    MRSA (methicillin resistant Staphylococcus aureus) 2012   Shingles     SURGICAL HISTORY: Past Surgical History:  Procedure Laterality Date   MASTECTOMY MODIFIED RADICAL Left 12/26/2019   Procedure: LEFT MASTECTOMY MODIFIED RADICAL;  Surgeon: Curvin Deward MOULD, MD;  Location[facility] OR;   Service: General;  Laterality: Left;  PEC BLOCK   PORTACATH PLACEMENT Right 12/26/2019   Procedure: INSERTION PORT-A-CATH WITH ULTRASOUND GUIDANCE;  Surgeon: Curvin Deward MOULD, MD;  Location: [facility] OR;  Service: General;  Laterality: Right;    I have reviewed the social history and family history with the patient and they are unchanged from previous note.  ALLERGIES:  is allergic to codeine.  MEDICATIONS:  Current Outpatient Medications  Medication Sig Dispense Refill   acetaminophen  (TYLENOL ) 650 MG CR tablet Take 650 mg by mouth every 8 (eight) hours as needed for pain.      ascorbic acid (VITAMIN C) 250 MG CHEW Chew by mouth.     benzonatate  (TESSALON ) 200 MG capsule Take 1 capsule (200 mg total) by mouth 3 (three) times daily as needed for cough. (Patient not taking: Reported on 08/30/2023) 20 capsule 0   dexamethasone  (DECADRON ) 0.5 MG/5ML solution Take 10 ml (1 mg) swish for 2 minutes and then spit.  Do this four times daily as needed Do Not eat or drink for 1 hour after. (Patient not taking: Reported on 08/30/2023) 240 mL 0   diphenoxylate -atropine  (LOMOTIL ) 2.5-0.025 MG tablet Take 1-2 tablets by mouth 4 (four) times daily as needed for diarrhea or loose stools. (Patient not taking: Reported on 07/05/2023) 30 tablet 1   doxycycline  (VIBRA -TABS) 100 MG tablet Take 1 tablet (100 mg total) by mouth 2 (two) times daily. (Patient not taking: Reported on 07/05/2023) 14 tablet 0   doxycycline  (VIBRA -TABS) 100 MG tablet Take 1 tablet (100 mg total) by mouth 2 (two) times daily. 14 tablet 0   gabapentin  (NEURONTIN ) 300 MG capsule Take 300 mg by mouth 3 (three) times daily as needed. (Patient not taking: Reported on 07/05/2023)     levofloxacin  (LEVAQUIN ) 500 MG tablet Take 1 tablet (500 mg total) by mouth daily. (Patient not taking: Reported on 07/05/2023) 7 tablet 0   lidocaine  (XYLOCAINE ) 2 % solution Use as directed 15 mLs in the mouth or throat every 4 (four) hours as needed for mouth pain. 100 mL 2    lidocaine -prilocaine  (EMLA ) cream Apply 1 Application topically as needed. 30 g 2   methocarbamol  (ROBAXIN ) 750 MG tablet Take 1 tablet (750 mg total) by mouth 4 (four) times daily as needed (use for muscle cramps/pain). (Patient not taking: Reported on 08/30/2023) 30 tablet 2   mucosal barrier oral (GELCLAIR) GEL Take 1 packet by mouth as needed. (Patient not taking: Reported on 07/05/2023)     omeprazole  (PRILOSEC) 20 MG capsule Take 1 capsule (20 mg total) by mouth daily. 30 capsule 2   valACYclovir  (VALTREX ) 1000 MG tablet Take 1 tablet (1,000 mg total) by mouth 2 (two) times daily. (Patient not taking: Reported on 08/30/2023) 14 tablet 1   No current facility-administered medications for this visit.    PHYSICAL EXAMINATION: ECOG PERFORMANCE STATUS: 1 - Symptomatic but completely ambulatory  Vitals:   02/14/24 1324 02/14/24 1327  BP: (!) 150/88 138/88  Pulse: 70   Resp: 20   Temp: 98.3 F (36.8 C)   SpO2: 94%    Wt Readings from Last 3 Encounters:  02/14/24 176 lb (79.8 kg)  08/30/23 180 lb 9.6 oz (81.9 kg)  07/19/23 175 lb 6.4 oz (79.6 kg)     GENERAL:alert, no distress and comfortable SKIN: skin color, texture, turgor are normal, no rashes or significant lesions EYES: normal, Conjunctiva are pink and non-injected, sclera clear NECK: supple, thyroid  normal size, non-tender, without nodularity LYMPH:  no palpable lymphadenopathy in the cervical, axillary  LUNGS: clear to auscultation and percussion with normal breathing effort HEART: regular rate & rhythm and no murmurs and no lower extremity edema ABDOMEN:abdomen soft, non-tender and normal bowel sounds Musculoskeletal:no cyanosis of digits and no clubbing  NEURO: alert & oriented x 3 with fluent speech, no focal motor/sensory deficits BREAST: Left breast post-mastectomy, no nodules. Right breast normal. Physical Exam    LABORATORY DATA:  I have reviewed the data as listed    Latest Ref Rng & Units 01/10/2024   10:02 AM  07/19/2023   10:14 AM 05/29/2023   10:19 AM  CBC  WBC 4.0 - 10.5 K/uL 4.9  6.0  6.5   Hemoglobin 12.0 - 15.0 g/dL 87.0  86.1  86.4   Hematocrit 36.0 - 46.0 % 38.1  40.5  38.9   Platelets 150 - 400 K/uL 139  150  158         Latest Ref Rng & Units 01/10/2024   10:02 AM 07/19/2023   10:14 AM 05/29/2023   10:19 AM  CMP  Glucose 70 - 99 mg/dL 98  897  880   BUN 8 - 23 mg/dL 17  15  13    Creatinine 0.44 - 1.00 mg/dL 9.42  9.35  9.39   Sodium 135 - 145 mmol/L 139  136  139   Potassium 3.5 - 5.1 mmol/L 3.9  4.1  3.6   Chloride 98 - 111 mmol/L 106  104  104   CO2 22 - 32 mmol/L 30  29  30    Calcium 8.9 - 10.3 mg/dL 8.7  8.8  9.3   Total Protein 6.5 - 8.1 g/dL 6.0  6.3  6.1   Total Bilirubin 0.0 - 1.2 mg/dL 0.5  0.7  0.6   Alkaline Phos 38 - 126 U/L 67  63  70   AST 15 - 41 U/L 31  31  37   ALT 0 - 44 U/L 15  13  14        RADIOGRAPHIC STUDIES: I have personally reviewed the radiological images as listed and agreed with the findings in the report. No results found.    No orders of the defined types were placed in this encounter.  All questions were answered. The patient knows to call the clinic with any problems, questions or concerns. No barriers to learning was detected. The total time spent in the appointment was 25 minutes, including review of chart and various tests results, discussions about plan of care and coordination of care plan     Onita Mattock, MD 02/14/2024

## 2024-02-14 NOTE — Assessment & Plan Note (Signed)
 Left breast Multifocal Metaplastic cancer, stage IIIC pT3N3aM0, including one unresectable axillary node, metaplastic carcinoma, triple negative, grade 3 -diagnosed in 10/2019. CT CAP and bone scan 11/2019 were negative for distant metastasis.  -S/p left mastectomy with Dr Carolynne Edouard on 12/26/19. Surgical path showed: 6cm multifocal metaplastic carcinoma; 13/16 positive LN, one of which was not able to be removed due to vascular invasion.  -s/p adjuvant carbo/taxol 02/05/20 - 05/13/20 followed by Adriamycin/cytoxan 05/20/20 - 07/13/20 along with [place] Sep through 01/19/21.  She tolerated AC poorly with mucositis, weight loss, constipation, and weakness.  -interim left axillary [place] 06/17/20 is negative for adenopathy, c/w response to adjuvant chemo -s/p adjuvant radiation with Dr. Basilio Cairo 08/24/20 - 10/05/20 -restaging CT chest on 12/07/2022 showed no evidence of cancer recurrence, except for surgical and postradiation changes. -she will keep her port for now. She will continue to return for flush every 6-8 weeks. -Circulating tumor DNA Signatera was negative in February 2025, will continue blood test every 6 months.

## 2024-03-27 ENCOUNTER — Inpatient Hospital Stay: Attending: Nurse Practitioner

## 2024-04-05 ENCOUNTER — Emergency Department (HOSPITAL_BASED_OUTPATIENT_CLINIC_OR_DEPARTMENT_OTHER)

## 2024-04-05 ENCOUNTER — Emergency Department (HOSPITAL_BASED_OUTPATIENT_CLINIC_OR_DEPARTMENT_OTHER): Admitting: Radiology

## 2024-04-05 ENCOUNTER — Emergency Department (HOSPITAL_BASED_OUTPATIENT_CLINIC_OR_DEPARTMENT_OTHER)
Admission: EM | Admit: 2024-04-05 | Discharge: 2024-04-05 | Disposition: A | Discharge status: Home / Self Care | Attending: Emergency Medicine | Admitting: Emergency Medicine

## 2024-04-05 ENCOUNTER — Encounter (HOSPITAL_BASED_OUTPATIENT_CLINIC_OR_DEPARTMENT_OTHER): Payer: Self-pay | Admitting: Emergency Medicine

## 2024-04-05 DIAGNOSIS — R059 Cough, unspecified: Secondary | ICD-10-CM | POA: Diagnosis present

## 2024-04-05 DIAGNOSIS — J168 Pneumonia due to other specified infectious organisms: Secondary | ICD-10-CM | POA: Insufficient documentation

## 2024-04-05 DIAGNOSIS — J189 Pneumonia, unspecified organism: Secondary | ICD-10-CM

## 2024-04-05 DIAGNOSIS — Z79899 Other long term (current) drug therapy: Secondary | ICD-10-CM | POA: Insufficient documentation

## 2024-04-05 DIAGNOSIS — Z853 Personal history of malignant neoplasm of breast: Secondary | ICD-10-CM | POA: Diagnosis not present

## 2024-04-05 DIAGNOSIS — J181 Lobar pneumonia, unspecified organism: Secondary | ICD-10-CM | POA: Insufficient documentation

## 2024-04-05 LAB — RESP PANEL BY RT-PCR (RSV, FLU A&B, COVID)  RVPGX2
Influenza A by PCR: NEGATIVE
Influenza B by PCR: NEGATIVE
Resp Syncytial Virus by PCR: NEGATIVE
SARS Coronavirus 2 by RT PCR: NEGATIVE

## 2024-04-05 MED ORDER — ALBUTEROL SULFATE HFA 108 (90 BASE) MCG/ACT IN AERS: 1.0000 | INHALATION_SPRAY | Freq: Four times a day (QID) | RESPIRATORY_TRACT | 0 refills | Status: AC | PRN

## 2024-04-05 MED ORDER — AMOXICILLIN-POT CLAVULANATE 875-125 MG PO TABS: 1.0000 | ORAL_TABLET | Freq: Two times a day (BID) | ORAL | 0 refills | Status: DC

## 2024-04-05 MED ORDER — PREDNISONE 20 MG PO TABS
40.0000 mg | ORAL_TABLET | Freq: Once | ORAL | Status: AC
  Administered 2024-04-05: 40 mg via ORAL
  Filled 2024-04-05: qty 2

## 2024-04-05 MED ORDER — AMOXICILLIN-POT CLAVULANATE 875-125 MG PO TABS: 1.0000 | ORAL_TABLET | Freq: Two times a day (BID) | ORAL | 0 refills | Status: AC

## 2024-04-05 MED ORDER — AMOXICILLIN-POT CLAVULANATE 875-125 MG PO TABS
1.0000 | ORAL_TABLET | Freq: Once | ORAL | Status: AC
  Administered 2024-04-05: 1 via ORAL
  Filled 2024-04-05: qty 1

## 2024-04-05 MED ORDER — PREDNISONE 10 MG PO TABS: ORAL_TABLET | ORAL | 0 refills | Status: DC

## 2024-04-05 MED ORDER — PREDNISONE 10 MG PO TABS: ORAL_TABLET | ORAL | 0 refills | Status: AC

## 2024-04-05 NOTE — ED Triage Notes (Signed)
 Pt c/o flare of chronic bronchitis x 4 days. Unable to be seen at pcp. Reports cough

## 2024-04-05 NOTE — ED Provider Notes (Signed)
 [place] EMERGENCY DEPARTMENT AT [facility] Provider Note   CSN: [id number] Arrival date & time: 04/05/24  1016     Patient presents with: Cough   Lindsay [name] is a 68 y.o. female with history of breast cancer currently not on any active treatment, presents with concern for a dry cough for the past 4 days.  This is a dry cough.  Denies any fever or chills associated.  Denies any associated chest pain or shortness of breath.  She reports this feels similar to flareups of bronchitis she has had in the past.     Cough      Prior to Admission medications   Medication Sig Start Date End Date Taking? Authorizing Provider  albuterol  (VENTOLIN  HFA) 108 (90 Base) MCG/ACT inhaler Inhale 1-2 puffs into the lungs every 6 (six) hours as needed for wheezing or shortness of breath. 04/05/24  Yes Veta Palma, PA-C  acetaminophen  (TYLENOL ) 650 MG CR tablet Take 650 mg by mouth every 8 (eight) hours as needed for pain.     [provider]  amoxicillin -clavulanate (AUGMENTIN ) 875-125 MG tablet Take 1 tablet by mouth 2 (two) times daily for 5 days. 04/05/24 04/10/24  Veta Palma, PA-C  ascorbic acid (VITAMIN C) 250 MG CHEW Chew by mouth.    [provider]  benzonatate  (TESSALON ) 200 MG capsule Take 1 capsule (200 mg total) by mouth 3 (three) times daily as needed for cough. Patient not taking: Reported on 08/30/2023 09/07/22   Lanny Callander, MD  diphenoxylate -atropine  (LOMOTIL ) 2.5-0.025 MG tablet Take 1-2 tablets by mouth 4 (four) times daily as needed for diarrhea or loose stools. Patient not taking: Reported on 07/05/2023 02/19/20   Lanny Callander, MD  gabapentin  (NEURONTIN ) 300 MG capsule Take 300 mg by mouth 3 (three) times daily as needed. Patient not taking: Reported on 07/05/2023 01/22/20   [provider]  lidocaine  (XYLOCAINE ) 2 % solution Use as directed 15 mLs in the mouth or throat every 4 (four) hours as needed for mouth pain. 06/11/20   Lanny Callander, MD   lidocaine -prilocaine  (EMLA ) cream Apply 1 Application topically as needed. 08/17/23   Lanny Callander, MD  methocarbamol  (ROBAXIN ) 750 MG tablet Take 1 tablet (750 mg total) by mouth 4 (four) times daily as needed (use for muscle cramps/pain). Patient not taking: Reported on 08/30/2023 12/26/19   Curvin Mt III, MD  mucosal barrier oral (GELCLAIR) GEL Take 1 packet by mouth as needed. Patient not taking: Reported on 07/05/2023    [provider]  omeprazole  (PRILOSEC) 20 MG capsule Take 1 capsule (20 mg total) by mouth daily. 02/12/20   Lanny Callander, MD  predniSONE  (DELTASONE ) 10 MG tablet Take 4 tablets (40 mg total) by mouth daily with breakfast for 1 day, THEN 3 tablets (30 mg total) daily with breakfast for 2 days, THEN 2 tablets (20 mg total) daily with breakfast for 2 days, THEN 1 tablet (10 mg total) daily with breakfast for 1 day. 04/06/24 04/12/24  Veta Palma, PA-C  valACYclovir  (VALTREX ) 1000 MG tablet Take 1 tablet (1,000 mg total) by mouth 2 (two) times daily. Patient not taking: Reported on 08/30/2023 06/11/20   Lanny Callander, MD  prochlorperazine  (COMPAZINE ) 10 MG tablet Take 1 tablet (10 mg total) by mouth every 6 (six) hours as needed (Nausea or vomiting). 01/28/20 08/03/20  Lanny Callander, MD    Allergies: Codeine    Review of Systems  Respiratory:  Positive for cough.     Updated Vital Signs BP ROLLEN)  141/74 (BP Location: Right Arm)   Pulse 77   Temp 98.6 F (37 C) (Oral)   Resp 18   SpO2 99%   Physical Exam Vitals and nursing note reviewed.  Constitutional:      General: She is not in acute distress.    Appearance: She is well-developed.  HENT:     Head: Normocephalic and atraumatic.  Eyes:     Conjunctiva/sclera: Conjunctivae normal.  Cardiovascular:     Rate and Rhythm: Normal rate and regular rhythm.     Heart sounds: No murmur heard. Pulmonary:     Effort: Pulmonary effort is normal. No respiratory distress.     Comments: Scattered rhonchi bilaterally Talking in full  sentences on room air without difficulty Abdominal:     Palpations: Abdomen is soft.     Tenderness: There is no abdominal tenderness.  Musculoskeletal:        General: No swelling.     Cervical back: Neck supple.  Skin:    General: Skin is warm and dry.     Capillary Refill: Capillary refill takes less than 2 seconds.  Neurological:     Mental Status: She is alert.  Psychiatric:        Mood and Affect: Mood normal.     (all labs ordered are listed, but only abnormal results are displayed) Labs Reviewed  RESP PANEL BY RT-PCR (RSV, FLU A&B, COVID)  [id number]    EKG: None  Radiology: CT Chest Wo Contrast Result Date: 04/05/2024 CLINICAL DATA:  Cough. Left lung nodule on current chest radiograph. History of left breast cancer. EXAM: CT CHEST WITHOUT CONTRAST TECHNIQUE: Multidetector CT imaging of the chest was performed following the standard protocol without IV contrast. RADIATION DOSE REDUCTION: This exam was performed according to the departmental dose-optimization program which includes automated exposure control, adjustment of the mA and/or kV according to patient size and/or use of iterative reconstruction technique. COMPARISON:  12/07/2022.  Current chest radiograph. FINDINGS: Cardiovascular: Heart is normal in size and configuration. Minor left coronary artery calcifications. No pericardial effusion. Great vessels are normal in caliber. Mild aortic atherosclerotic calcifications. Mediastinum/Nodes: No neck base, mediastinal or hilar masses. No enlarged lymph nodes. Trachea and esophagus are unremarkable. Lungs/Pleura: Chronic areas of bronchiectasis with intervening peribronchovascular ground-glass opacities, predominantly in the upper lobes. Additional peribronchovascular nodularity noted in the right lower lobe posteriorly, new since the prior CT. Other findings are stable. No pleural effusion or pneumothorax. Upper Abdomen: No acute findings. Aortic atherosclerosis. 2 splenic  calcifications consistent with healed granuloma. Musculoskeletal: Prior left mastectomy.  No chest wall mass. No fracture or acute finding.  No bone lesion. IMPRESSION: 1. Small area of peribronchovascular nodularity in the posterior right lower lobe is new since the prior exam, possibly an area acute infection. No other evidence of an acute abnormality. 2. Mid lung nodule suggested on the current chest radiograph is due to chronic lung scarring and bronchiectasis, stable when compared to the prior chest CT. 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 3. Stable changes from the previous left mastectomy Aortic Atherosclerosis (ICD10-I70.0). Electronically Signed   By: Alm Parkins M.D.   On: 04/05/2024 13:06   DG Chest 2 View Result Date: 04/05/2024 EXAM: 2 VIEW(S) XRAY OF THE CHEST 04/05/2024 11:02:14 AM COMPARISON: 09/10/2022 CLINICAL HISTORY: Cough FINDINGS: LINES, TUBES AND DEVICES: Right Port-A-Cath in place with tip in lower SVC. LUNGS AND PLEURA: Chronic scarring seen in both central lung zones. 1.1 cm left midlung pulmonary nodule.  No acute infiltrate. No pleural effusion. No pneumothorax. HEART AND MEDIASTINUM: No acute abnormality of the cardiac and mediastinal silhouettes. BONES AND SOFT TISSUES: No acute osseous abnormality. Prior left mastectomy and axillary lymph node dissection. IMPRESSION: 1. Stable appearance of  bilateral pulmonary parenchymal scarring. 2. Left midlung 1.1 cm pulmonary nodule. Further evaluation by chest CT is recommended . Electronically signed by: Norleen Kil MD 04/05/2024 11:39 AM EST RP Workstation: [id number]     Procedures   Medications Ordered in the ED  amoxicillin -clavulanate (AUGMENTIN ) 875-125 MG per tablet 1 tablet (1 tablet Oral Given 04/05/24 1503)  predniSONE  (DELTASONE ) tablet 40 mg (40 mg Oral Given 04/05/24 1503)                                    Medical Decision Making Amount and/or Complexity of Data Reviewed Radiology:  ordered.  Risk Prescription drug management.     Differential diagnosis includes but is not limited to COVID, flu, RSV, viral URI, strep pharyngitis, viral pharyngitis, allergic rhinitis, pneumonia, bronchitis, CHF exacerbation   ED Course:  Upon initial evaluation, patient is well-appearing, no acute distress.  Scattered rhonchi on lung auscultation bilaterally.  She has an elevated blood pressure of 167/81 upon arrival, but otherwise normal vitals.  Labs Ordered: I Ordered, and personally interpreted labs.  The pertinent results include:   COVID, flu, RSV negative  Imaging Studies ordered: I ordered imaging studies including chest x-ray, CT chest I independently visualized the imaging with scope of interpretation limited to determining acute life threatening conditions related to emergency care. Imaging showed  Chest x-ray: IMPRESSION:  1. Stable appearance of  bilateral pulmonary parenchymal scarring.  2. Left midlung 1.1 cm pulmonary nodule. Further evaluation by chest CT is  recommended .   CT chest: IMPRESSION:  1. Small area of peribronchovascular nodularity in the posterior  right lower lobe is new since the prior exam, possibly an area acute  infection. No other evidence of an acute abnormality.  2. Mid lung nodule suggested on the current chest radiograph is due  to chronic lung scarring and bronchiectasis, stable when compared to  the prior chest CT. Some of this on the left is likely radiation  induced fibrosis.  3. Stable changes from the previous left mastectomy   I agree with the radiologist interpretation   Medications Given: Augmentin  Prednisone   Upon re-evaluation, patient remains well-appearing with stable vitals.  Still 99% oxygen saturation on room air.  Patient's chest x-ray showed a possible pulmonary nodule, and CT was recommended for further evaluation.  Chest CT was obtained and the area concerning for possible pulmonary nodule on patient's  chest x-ray was noted to be chronic lung scarring.  No pulmonary nodules noted on chest CT. Patient was noted to have a small area in the right lower lobe on chest CT concerning for acute infection.  Given symptoms of cough and congestion, will treat for possible pneumonia.  My attending Dr. Jakie also personally saw and evaluated patient.  He recommends treatment with course of prednisone  and Augmentin .  These have been prescribed for patient.  Patient is stable and appropriate for discharge home    Impression: Right lower lobe pneumonia  Disposition:  Discharged home with instructions to take course of Augmentin  as prescribed.  Prednisone  as prescribed.  Over-the-counter medications as needed for symptom control.  Follow-up with PCP if symptoms not improving within the next 5 days. Return precautions  given and patient verbalized understanding.    Record Review: External records from outside source obtained and reviewed including oncology notes and previous ER visits for pneumonia     This chart was dictated using voice recognition software, Dragon. Despite the best efforts of this provider to proofread and correct errors, errors may still occur which can change documentation meaning.       Final diagnoses:  Pneumonia of right lower lobe due to infectious organism    ED Discharge Orders          Ordered    predniSONE  (DELTASONE ) 10 MG tablet  Q breakfast,   Status:  Discontinued        04/05/24 1450    predniSONE  (DELTASONE ) 10 MG tablet  Q breakfast        04/05/24 1510    amoxicillin -clavulanate (AUGMENTIN ) 875-125 MG tablet  2 times daily,   Status:  Discontinued        04/05/24 1450    amoxicillin -clavulanate (AUGMENTIN ) 875-125 MG tablet  2 times daily        04/05/24 1510    albuterol  (VENTOLIN  HFA) 108 (90 Base) MCG/ACT inhaler  Every 6 hours PRN        04/05/24 1510               Veta Palma, PA-C 04/05/24 1934    Yolande Lamar BROCKS,  MD 04/06/24 1718

## 2024-04-05 NOTE — Discharge Instructions (Addendum)
 Your chest CT showed that you have a pneumonia. The pulmonary nodule though to be on your chest x-ray was just scarring of lung tissue. No pulmonary nodules.    You have been prescribed an antibiotic called Augmentin  to treat your pneumonia. Take this antibiotic 2 times a day for the next 5 days. You were given your first dose here today. Take your next dose tomorrow morning. Take the full course of your antibiotic even if you start feeling better. Antibiotics may cause you to have diarrhea.  You have been prescribed prednisone . Please take this medication as prescribed for the next 7 days (40mg  on days 1 and 2, 30mg  on days 3 and 4, 20mg  on days 5 and 6, 10mg  on day 7).  You were given your first dose here today.  Take your next dose tomorrow morning.  Take this medication in the morning with breakfast, as taking it at night may make it hard to sleep. If you are a diabetic, please monitor your blood sugars closely on this medication, as it can cause your blood sugar to rise.   Your flu, covid, and RSV test were negative today.   Home care instructions:  You can take Tylenol  and/or Ibuprofen as directed on the packaging for fever reduction and pain relief.    For cough: honey 1/2 to 1 teaspoon (you can dilute the honey in water or another fluid).  You can also use guaifenesin and dextromethorphan for cough which are over-the-counter medications. You can use a humidifier for chest congestion and cough.  If you don't have a humidifier, you can sit in the bathroom with the hot shower running.      For sore throat: try warm salt water gargles, cepacol lozenges, throat spray, warm tea or water with lemon/honey, popsicles or ice, or OTC cold relief medicine for throat discomfort.    For congestion: Flonase (Fluticasone) 1-2 sprays in each nostril daily. This is an over the counter medication.    It is important to stay hydrated: drink plenty of fluids (water, gatorade/powerade/pedialyte, juices, or teas)  to keep your throat moisturized and help further relieve irritation/discomfort.   Take basic precautions such as washing your hands often, covering your mouth when you cough or sneeze, and avoiding public places where you could spread your illness to others.   Follow-up instructions: Please follow-up with your primary care provider for further evaluation of your symptoms if you are not feeling better within the next 5 days.   Return instructions:  Please return to the Emergency Department if you experience worsening symptoms.  RETURN IMMEDIATELY IF you develop shortness of breath, confusion or altered mental status, a new rash, become dizzy, faint, or poorly responsive, or are unable to be cared for at home. Please return if you have persistent vomiting and cannot keep down fluids or develop a fever that is not controlled by tylenol  or motrin.   Please return if you have any other emergent concerns.

## 2024-04-09 ENCOUNTER — Telehealth: Payer: Self-pay

## 2024-04-09 ENCOUNTER — Other Ambulatory Visit: Payer: Self-pay

## 2024-04-09 NOTE — Telephone Encounter (Signed)
 Copied from CRM [id number]. Topic: Clinical - Medical Advice >> Apr 09, 2024 12:16 PM Chantha C wrote: Reason for CRM: Patient states had a respiratory problem went to [facility] ED 04/05/24 and chest xray and CT scan shows nodule has pneunomia in left quadrant lung and is being treated for pneumonia. Patient states was advised to treated the pneumonia, and then address the issues with the nodule. Patient already contact oncologist Dr. Lanny and was advised to contact Dr. Shelah, patient is calling to let Dr. Shelah know . Please call back if needed at [phone].  Please advise Dr Shelah

## 2024-04-09 NOTE — Telephone Encounter (Signed)
 Thank you - please set her up with an OV with me so we can review

## 2024-04-09 NOTE — Telephone Encounter (Signed)
 Pt called stating she had a CT Scan and CXR of her chest an a Lft MidLung lesion 1.1cm was found.  Pt stated she will f/u with Dr Jakie and her pulmonologist.  Pt wanted to make Dr Demetra office aware.  Stated this nurse will make Dr Lanny aware of her recent test.

## 2024-04-10 NOTE — Telephone Encounter (Signed)
 Thank you  - working on getting her seen in office

## 2024-04-14 NOTE — Telephone Encounter (Signed)
 Pt needs appt per Dr Lowden

## 2024-04-15 NOTE — Telephone Encounter (Signed)
 Patient is scheduled with Dr. Shelah on 04/24/2024. Nothing further needed

## 2024-04-24 ENCOUNTER — Ambulatory Visit: Admitting: Emergency Medicine

## 2024-04-24 VITALS — BP 124/62 | HR 62 | Ht 61.0 in | Wt 189.4 lb

## 2024-04-24 DIAGNOSIS — Z23 Encounter for immunization: Secondary | ICD-10-CM

## 2024-04-24 DIAGNOSIS — J479 Bronchiectasis, uncomplicated: Secondary | ICD-10-CM | POA: Diagnosis not present

## 2024-04-24 NOTE — Patient Instructions (Signed)
 We reviewed your CT scan of the chest from November 2025 We will plan to repeat your CT chest in early June 2026 Please keep albuterol  available to use 2 puffs if needed for shortness of breath, wheezing Please notify our office if you develop symptoms of recurrent bronchitis or flaring of your bronchiectasis.  If so we will work on treating you and also probably try to obtain a sputum sample to send for culture. We will give you the Prevnar pneumonia shot today Follow in our office in June 2026, sooner if you have any problems.

## 2024-04-24 NOTE — Assessment & Plan Note (Signed)
 Bronchiectasis with minimal symptoms unless she is flaring.  She does not need flutter, pulmonary hygiene at this time.  Her last flare was in late November and a CT chest was done that showed some new micronodular disease at the right base.  Most consistent with the inflammatory process as opposed to malignancy.  I will repeat her CT in 6 months to ensure clearance.  Consider also mycobacterial colonization.  If she flares again then we will work on getting a sputum culture.  If flares become more frequent or abnormalities progress on CT then we could also consider bronchoscopy.  She needs a Prevnar pneumonia shot today.

## 2024-04-24 NOTE — Progress Notes (Signed)
 Subjective:    Patient ID: Lindsay [name], female    DOB: Sep 15, 1955, 68 y.o.   MRN: [id number]  HPI  ROV 08/30/2023 --Lindsay [name] is 68 years old with a minimal tobacco history, history of breast cancer with radiation change in the left upper lobe, history of chronic bronchitis, COVID-19 in 01/2021.  I saw her in February.  She has underlying GERD with a hiatal hernia on Pepcid  or Prilosec as needed.  She underwent pulmonary function testing as below.  She had increased chest burning, more cough with some mucous production for about 2 weeks. She had to use her albuterol , helped w some SOB and mucous management.   PFT 07/12/2023 reviewed by me, show normal airflows without a bronchodilator response, normal lung volumes, normal diffusion capacity, normal flow-volume loop.   ROV 04/24/2024 --pleasant 68 year old woman with a minimal tobacco history, history of chronic bronchitic symptoms with COVID-19 in 01/2021, history of breast cancer with radiation change in the left upper lobe.  Her airflows were normal on pulmonary function testing although question a component of asthma related to her cough.  She had a community-acquired pneumonia / flare and was treated with antibiotics, steroids.  A CT scan of her chest was performed 04/05/2024 in the ED. She does not have much mucous burden on a normal day, does not have to do flutter or pulm hygiene. Needs Prevnar today. Doesn't use albuterol  frequently unless she is flaring.   CT scan of the chest 04/05/2024 reviewed by me, shows no mediastinal or hilar adenopathy, chronic areas of bronchiectasis and peribronchovascular ground glass especially in the upper lobes with some peribronchovascular nodularity also in the right lower lobe new compared with her prior 12/2022     Review of Systems As per HPI  Past Medical History:  Diagnosis Date   Breast CA (HCC)    Bronchitis    Family history of bone cancer    Family history of breast cancer    Family history  of prostate cancer    Fibromyalgia    HCV (hepatitis C virus)    MRSA (methicillin resistant Staphylococcus aureus) 2012   Shingles      Family History  Problem Relation Age of Onset   Breast cancer Maternal Aunt 78   Prostate cancer Maternal Uncle        dx. in his 31s   Bone cancer Maternal Grandmother        dx. 63   Cancer Paternal Grandmother        possible cancer, unknown type    Prostate cancer Cousin        maternal first cousin   Heart Problems Brother    Bipolar disorder Brother    Cancer Cousin        unknown type, paternal first cousin     Social History   Socioeconomic History   Marital status: Single    Spouse name: Not on file   Number of children: Not on file   Years of education: Not on file   Highest education level: Not on file  Occupational History   Not on file  Tobacco Use   Smoking status: Former    Types: Cigarettes    Start date: 52    Quit date: 1973    Years since quitting: 52.9   Smokeless tobacco: Never  Vaping Use   Vaping status: Never Used  Substance and Sexual Activity   Alcohol use: Not Currently   Drug use: Never   Sexual activity:  Not Currently  Other Topics Concern   Not on file  Social History Narrative   Not on file   Social Drivers of Health   Tobacco Use: Medium Risk (04/24/2024)   Patient History    Smoking Tobacco Use: Former    Smokeless Tobacco Use: Never    Passive Exposure: Not on Actuary Strain: Not on file  Food Insecurity: Not on file  Transportation Needs: Not on file  Physical Activity: Not on file  Stress: Not on file  Social Connections: Unknown (09/20/2021)   [facility]   Social Network    Social Network: Not on file  Intimate Partner Violence: Unknown (08/11/2021)   Received from [facility]   HITS    Physically Hurt: Not on file    Insult or Talk Down To: Not on file    Threaten Physical Harm: Not on file    Scream or Curse: Not on file  Depression  (PHQ2-9): Low Risk (02/14/2024)   Depression (PHQ2-9)    PHQ-2 Score: 0  Alcohol Screen: Not on file  Housing: Not on file  Utilities: Not on file  Health Literacy: Not on file     Allergies  Allergen Reactions   Codeine Hives and Nausea And Vomiting     Outpatient Medications Prior to Visit  Medication Sig Dispense Refill   acetaminophen  (TYLENOL ) 650 MG CR tablet Take 650 mg by mouth every 8 (eight) hours as needed for pain.      albuterol  (VENTOLIN  HFA) 108 (90 Base) MCG/ACT inhaler Inhale 1-2 puffs into the lungs every 6 (six) hours as needed for wheezing or shortness of breath. 1 each 0   ascorbic acid (VITAMIN C) 250 MG CHEW Chew by mouth.     lidocaine  (XYLOCAINE ) 2 % solution Use as directed 15 mLs in the mouth or throat every 4 (four) hours as needed for mouth pain. 100 mL 2   lidocaine -prilocaine  (EMLA ) cream Apply 1 Application topically as needed. 30 g 2   benzonatate  (TESSALON ) 200 MG capsule Take 1 capsule (200 mg total) by mouth 3 (three) times daily as needed for cough. (Patient not taking: Reported on 04/24/2024) 20 capsule 0   diphenoxylate -atropine  (LOMOTIL ) 2.5-0.025 MG tablet Take 1-2 tablets by mouth 4 (four) times daily as needed for diarrhea or loose stools. (Patient not taking: Reported on 04/24/2024) 30 tablet 1   gabapentin  (NEURONTIN ) 300 MG capsule Take 300 mg by mouth 3 (three) times daily as needed. (Patient not taking: Reported on 04/24/2024)     methocarbamol  (ROBAXIN ) 750 MG tablet Take 1 tablet (750 mg total) by mouth 4 (four) times daily as needed (use for muscle cramps/pain). (Patient not taking: Reported on 04/24/2024) 30 tablet 2   mucosal barrier oral (GELCLAIR) GEL Take 1 packet by mouth as needed. (Patient not taking: Reported on 04/24/2024)     omeprazole  (PRILOSEC) 20 MG capsule Take 1 capsule (20 mg total) by mouth daily. (Patient not taking: Reported on 04/24/2024) 30 capsule 2   valACYclovir  (VALTREX ) 1000 MG tablet Take 1 tablet (1,000 mg  total) by mouth 2 (two) times daily. (Patient not taking: Reported on 04/24/2024) 14 tablet 1   No facility-administered medications prior to visit.        Objective:   Physical Exam  Vitals:   04/24/24 1028  BP: 124/62  Pulse: 62  SpO2: 98%  Weight: 189 lb 6.4 oz (85.9 kg)  Height: 5' 1 (1.549 m)   Gen: Pleasant, well-nourished, in  no distress,  normal affect  ENT: No lesions,  mouth clear,  oropharynx clear, no postnasal drip  Neck: No JVD, no stridor  Lungs: No use of accessory muscles, no crackles or wheezing on normal respiration, no wheeze on forced expiration  Cardiovascular: RRR, heart sounds normal, no murmur or gallops, no peripheral edema  Musculoskeletal: No deformities, no cyanosis or clubbing  Neuro: alert, awake, non focal  Skin: Warm, no lesions or rash     Assessment & Plan:  Bronchiectasis without complication (HCC) Bronchiectasis with minimal symptoms unless she is flaring.  She does not need flutter, pulmonary hygiene at this time.  Her last flare was in late November and a CT chest was done that showed some new micronodular disease at the right base.  Most consistent with the inflammatory process as opposed to malignancy.  I will repeat her CT in 6 months to ensure clearance.  Consider also mycobacterial colonization.  If she flares again then we will work on getting a sputum culture.  If flares become more frequent or abnormalities progress on CT then we could also consider bronchoscopy.  She needs a Prevnar pneumonia shot today.   I personally spent a total of 30 minutes in the care of the patient today including preparing to see the patient, getting/reviewing separately obtained history, performing a medically appropriate exam/evaluation, counseling and educating, placing orders, documenting clinical information in the EHR, independently interpreting results, and communicating results.   Lamar Chris, MD, PhD 04/24/2024, 11:05 AM [facility] [phone] or if no answer before 7:00PM call [phone] For any issues after 7:00PM please call eLink [phone]

## 2024-05-06 ENCOUNTER — Ambulatory Visit: Payer: Self-pay | Admitting: Emergency Medicine

## 2024-05-06 DIAGNOSIS — J42 Unspecified chronic bronchitis: Secondary | ICD-10-CM

## 2024-05-06 MED ORDER — AZITHROMYCIN 250 MG PO TABS: ORAL_TABLET | ORAL | 0 refills | Status: AC

## 2024-05-06 NOTE — Telephone Encounter (Signed)
 FYI Only or Action Required?: Action required by provider: clinical question for provider and update on patient condition.  Patient is followed in Pulmonology for Bronchiectasis, last seen on 04/24/2024 by Shelah Lamar RAMAN, MD.  Called Nurse Triage reporting Shortness of Breath.  Symptoms began several days ago.  Interventions attempted: Rescue inhaler and Increased fluids/rest.  Symptoms are: unchanged.  Triage Disposition: See PCP When Office is Open (Within 3 Days)  Patient/caregiver understands and will follow disposition?: No, wishes to speak with PCP  E2C2 Pulmonary Triage - Initial Assessment Questions Chief Complaint (e.g., cough, sob, wheezing, fever, chills, sweat or additional symptoms) *Go to specific symptom protocol after initial questions. Patient with hx of bronchiectasis reports sinus infection symptoms along with shortness of breath. Patient reports headaches, right side of the face swelling, redness, burning radiating, soreness and pain. Patient reports she was diagnosed with bronchitis and was give antibiotics. Patient states antibiotics were only for five days and she feels that wasn't enough. Patient states she was told that she should give sputum samples if she has another flare. Patient is asking if she should come to the office tomorrow to give samples. Patient is asking for a call back from the office.   How long have symptoms been present? 05/01/2024  Have you tested for COVID or Flu? Note: If not, ask patient if a home test can be taken. If so, instruct patient to call back for positive results. No  MEDICINES:   Have you used any OTC meds to help with symptoms? No If yes, ask What medications?   Have you used your inhalers/maintenance medication? Yes If yes, What medications? albuterol   If inhaler, ask How many puffs and how often? Note: Review instructions on medication in the chart. Albuterol -reports she hasn't had to use her inhaler.    OXYGEN: Do you wear supplemental oxygen? No If yes, How many liters are you supposed to use?  Do you monitor your oxygen levels? No If yes, What is your reading (oxygen level) today?   What is your usual oxygen saturation reading?  (Note: Pulmonary O2 sats should be 90% or greater)   Copied from CRM #[id number]. Topic: Clinical - Red Word Triage >> May 06, 2024  3:51 PM Leila C wrote: Red Word that prompted transfer to Nurse Triage: Patient [phone] states has a sinus infection started 05/01/24, symptoms headaches, right side of the face swelling, redness, burning radiating, sore, pain, and  shortness of breath, does not know if patient needs to see Dr. Shelah or go to ENT? Patient has taken over the counter medication. Patient states has bronchitis and was prescribed medications, and thinks the infection has not cleared and wants more medication. Patient was advised to give a mucous samples, if there's another flare up. Patient will be in [facility] tomorrow, and is asking if she needs to drop off the samples. Please advise.   [facility] [place], [place] - [address] [place] [address] Phone: [phone] Fax: [phone] Reason for Disposition  [1] MODERATE longstanding difficulty breathing (e.g., speaks in phrases, SOB even at rest, pulse 100-120) AND [2] SAME as normal  Answer Assessment - Initial Assessment Questions 6. CARDIAC HISTORY: Do you have any history of heart disease? (e.g., heart attack, angina, bypass surgery, angioplasty)      no 7. LUNG HISTORY: Do you have any history of lung disease?  (e.g., pulmonary embolus, asthma, emphysema)     yes 12. TRAVEL: Have  you traveled out of the country in the last month? (e.g., travel history, exposures)       no  Protocols used: Breathing Difficulty-A-AH

## 2024-05-06 NOTE — Telephone Encounter (Signed)
 Called and spoke with the pt. Sx started 12/25 nasal septum is burning (red) w/ green/yellow mucus. Pt tried steaming face over hot pot of water and took otc mucinex and tylenol . Did not relieve pain. Pt has noticed increased sob since she is very congested. Pt states she may of had a slight fever as she is sweating a lot recently. Pt has occasional dry cough since having bronchitis. Pt states she thinks virus is still in her body. Pt will be in [place] tmr for port flush and is inquiring if she should do sputum sample per Dr. Shelah ronco note and if abx are needed. Pt has not tested for flu or covid.   Per RB lov note Please notify our office if you develop symptoms of recurrent bronchitis or flaring of your bronchiectasis.  If so we will work on treating you and also probably try to obtain a sputum sample to send for culture. We will give you the Prevnar pneumonia shot today  Routing to DOD, Tammy can you please advise.

## 2024-05-06 NOTE — Telephone Encounter (Signed)
 Would test for COVID and flu call back if positive If she wants to stop by and leave a sputum culture that is fine you can put a sputum culture order in and she can drop off sample. Will send in antibiotic for Z-Pak take as directed Supportive care with fluids and rest.  Tylenol  as needed If not improving will need office visit for further evaluation and treatment options

## 2024-05-06 NOTE — Telephone Encounter (Signed)
 Called and spoke with the pt and advised of TP note. Pt verbalized understanding and is going to take rapid flu & covid test. Pt will come by office 12/31 before we close at 12pm to do sputum culture.  Order placed.  Nothing further needed.

## 2024-05-07 ENCOUNTER — Inpatient Hospital Stay: Attending: Nurse Practitioner

## 2024-05-07 ENCOUNTER — Other Ambulatory Visit

## 2024-05-07 DIAGNOSIS — J42 Unspecified chronic bronchitis: Secondary | ICD-10-CM

## 2024-05-08 LAB — RESPIRATORY CULTURE OR RESPIRATORY AND SPUTUM CULTURE: MICRO NUMBER:: 17415194

## 2024-05-16 ENCOUNTER — Ambulatory Visit: Payer: Self-pay | Admitting: Adult Health

## 2024-06-19 ENCOUNTER — Inpatient Hospital Stay: Attending: Nurse Practitioner

## 2024-07-31 ENCOUNTER — Inpatient Hospital Stay: Attending: Nurse Practitioner

## 2024-09-11 ENCOUNTER — Inpatient Hospital Stay

## 2024-09-11 ENCOUNTER — Inpatient Hospital Stay: Attending: Nurse Practitioner | Admitting: Hematology
